# Patient Record
Sex: Female | Born: 1935 | Race: White | Hispanic: No | Smoking: Former smoker
Health system: Southern US, Community
[De-identification: ages and names within clinical notes are randomized; demographics above are authoritative.]

## PROBLEM LIST (undated history)

## (undated) DIAGNOSIS — E119 Type 2 diabetes mellitus without complications: Secondary | ICD-10-CM

## (undated) DIAGNOSIS — I2510 Atherosclerotic heart disease of native coronary artery without angina pectoris: Secondary | ICD-10-CM

## (undated) DIAGNOSIS — K8020 Calculus of gallbladder without cholecystitis without obstruction: Secondary | ICD-10-CM

## (undated) DIAGNOSIS — I639 Cerebral infarction, unspecified: Secondary | ICD-10-CM

## (undated) DIAGNOSIS — E785 Hyperlipidemia, unspecified: Secondary | ICD-10-CM

## (undated) DIAGNOSIS — I495 Sick sinus syndrome: Secondary | ICD-10-CM

## (undated) DIAGNOSIS — F0390 Unspecified dementia without behavioral disturbance: Secondary | ICD-10-CM

## (undated) DIAGNOSIS — I10 Essential (primary) hypertension: Secondary | ICD-10-CM

## (undated) DIAGNOSIS — F419 Anxiety disorder, unspecified: Secondary | ICD-10-CM

## (undated) HISTORY — DX: Essential (primary) hypertension: I10

## (undated) HISTORY — PX: TOTAL ABDOMINAL HYSTERECTOMY W/ BILATERAL SALPINGOOPHORECTOMY: SHX83

## (undated) HISTORY — DX: Sick sinus syndrome: I49.5

## (undated) HISTORY — DX: Anxiety disorder, unspecified: F41.9

## (undated) HISTORY — PX: ABDOMINAL SURGERY: SHX537

## (undated) HISTORY — PX: TONSILLECTOMY: SUR1361

## (undated) HISTORY — PX: APPENDECTOMY: SHX54

## (undated) HISTORY — DX: Hyperlipidemia, unspecified: E78.5

## (undated) HISTORY — DX: Calculus of gallbladder without cholecystitis without obstruction: K80.20

## (undated) HISTORY — PX: CORONARY ARTERY BYPASS GRAFT: SHX141

## (undated) HISTORY — DX: Type 2 diabetes mellitus without complications: E11.9

## (undated) HISTORY — PX: ESOPHAGOGASTRODUODENOSCOPY: SHX1529

## (undated) HISTORY — DX: Atherosclerotic heart disease of native coronary artery without angina pectoris: I25.10

## (undated) HISTORY — DX: Unspecified dementia, unspecified severity, without behavioral disturbance, psychotic disturbance, mood disturbance, and anxiety: F03.90

---

## 1997-05-27 ENCOUNTER — Emergency Department (HOSPITAL_COMMUNITY): Admission: EM | Admit: 1997-05-27 | Discharge: 1997-05-27 | Payer: Self-pay | Admitting: Emergency Medicine

## 1997-06-29 ENCOUNTER — Ambulatory Visit (HOSPITAL_COMMUNITY): Admission: RE | Admit: 1997-06-29 | Discharge: 1997-06-29 | Payer: Self-pay | Admitting: Family Medicine

## 1997-10-09 ENCOUNTER — Encounter: Admission: RE | Admit: 1997-10-09 | Discharge: 1998-01-07 | Payer: Self-pay | Admitting: Family Medicine

## 1998-01-02 ENCOUNTER — Encounter: Payer: Self-pay | Admitting: Family Medicine

## 1998-01-02 ENCOUNTER — Ambulatory Visit (HOSPITAL_COMMUNITY): Admission: RE | Admit: 1998-01-02 | Discharge: 1998-01-02 | Payer: Self-pay | Admitting: Family Medicine

## 1998-03-26 ENCOUNTER — Encounter: Admission: RE | Admit: 1998-03-26 | Discharge: 1998-06-24 | Payer: Self-pay | Admitting: Family Medicine

## 1998-03-27 ENCOUNTER — Inpatient Hospital Stay (HOSPITAL_COMMUNITY): Admission: EM | Admit: 1998-03-27 | Discharge: 1998-03-29 | Payer: Self-pay | Admitting: Internal Medicine

## 1998-03-28 ENCOUNTER — Encounter (HOSPITAL_BASED_OUTPATIENT_CLINIC_OR_DEPARTMENT_OTHER): Payer: Self-pay | Admitting: Internal Medicine

## 1998-05-06 ENCOUNTER — Emergency Department (HOSPITAL_COMMUNITY): Admission: EM | Admit: 1998-05-06 | Discharge: 1998-05-06 | Payer: Self-pay | Admitting: Emergency Medicine

## 1998-05-06 ENCOUNTER — Encounter: Payer: Self-pay | Admitting: Emergency Medicine

## 1998-12-27 ENCOUNTER — Encounter: Payer: Self-pay | Admitting: Family Medicine

## 1998-12-27 ENCOUNTER — Ambulatory Visit (HOSPITAL_COMMUNITY): Admission: RE | Admit: 1998-12-27 | Discharge: 1998-12-27 | Payer: Self-pay | Admitting: Family Medicine

## 1999-01-17 ENCOUNTER — Ambulatory Visit (HOSPITAL_COMMUNITY): Admission: RE | Admit: 1999-01-17 | Discharge: 1999-01-17 | Payer: Self-pay | Admitting: Family Medicine

## 1999-01-17 ENCOUNTER — Encounter: Payer: Self-pay | Admitting: Family Medicine

## 2001-08-28 ENCOUNTER — Emergency Department (HOSPITAL_COMMUNITY): Admission: EM | Admit: 2001-08-28 | Discharge: 2001-08-28 | Payer: Self-pay | Admitting: Emergency Medicine

## 2001-09-19 ENCOUNTER — Observation Stay (HOSPITAL_COMMUNITY): Admission: EM | Admit: 2001-09-19 | Discharge: 2001-09-20 | Payer: Self-pay | Admitting: Emergency Medicine

## 2001-09-20 ENCOUNTER — Encounter: Payer: Self-pay | Admitting: Internal Medicine

## 2001-11-18 ENCOUNTER — Ambulatory Visit (HOSPITAL_COMMUNITY): Admission: RE | Admit: 2001-11-18 | Discharge: 2001-11-18 | Payer: Self-pay | Admitting: Family Medicine

## 2001-11-18 ENCOUNTER — Encounter: Payer: Self-pay | Admitting: Family Medicine

## 2002-01-27 HISTORY — PX: PACEMAKER INSERTION: SHX728

## 2002-01-31 ENCOUNTER — Encounter: Payer: Self-pay | Admitting: Family Medicine

## 2002-01-31 ENCOUNTER — Ambulatory Visit (HOSPITAL_COMMUNITY): Admission: RE | Admit: 2002-01-31 | Discharge: 2002-01-31 | Payer: Self-pay | Admitting: Family Medicine

## 2002-03-23 ENCOUNTER — Inpatient Hospital Stay (HOSPITAL_COMMUNITY): Admission: EM | Admit: 2002-03-23 | Discharge: 2002-03-28 | Payer: Self-pay | Admitting: Emergency Medicine

## 2002-03-26 ENCOUNTER — Encounter: Payer: Self-pay | Admitting: *Deleted

## 2002-03-27 ENCOUNTER — Encounter: Payer: Self-pay | Admitting: *Deleted

## 2002-03-28 ENCOUNTER — Encounter (INDEPENDENT_AMBULATORY_CARE_PROVIDER_SITE_OTHER): Payer: Self-pay | Admitting: Cardiology

## 2002-05-24 ENCOUNTER — Ambulatory Visit (HOSPITAL_COMMUNITY): Admission: RE | Admit: 2002-05-24 | Discharge: 2002-05-24 | Payer: Self-pay | Admitting: Family Medicine

## 2002-05-24 ENCOUNTER — Encounter: Payer: Self-pay | Admitting: Family Medicine

## 2002-08-29 ENCOUNTER — Inpatient Hospital Stay (HOSPITAL_COMMUNITY): Admission: RE | Admit: 2002-08-29 | Discharge: 2002-09-01 | Payer: Self-pay | Admitting: Family Medicine

## 2002-08-31 ENCOUNTER — Encounter: Payer: Self-pay | Admitting: Family Medicine

## 2003-02-10 ENCOUNTER — Ambulatory Visit (HOSPITAL_COMMUNITY): Admission: RE | Admit: 2003-02-10 | Discharge: 2003-02-10 | Payer: Self-pay | Admitting: Family Medicine

## 2003-02-17 ENCOUNTER — Ambulatory Visit (HOSPITAL_COMMUNITY): Admission: RE | Admit: 2003-02-17 | Discharge: 2003-02-17 | Payer: Self-pay | Admitting: *Deleted

## 2003-03-24 ENCOUNTER — Emergency Department (HOSPITAL_COMMUNITY): Admission: EM | Admit: 2003-03-24 | Discharge: 2003-03-24 | Payer: Self-pay | Admitting: Emergency Medicine

## 2003-10-23 ENCOUNTER — Other Ambulatory Visit: Admission: RE | Admit: 2003-10-23 | Discharge: 2003-10-23 | Payer: Self-pay | Admitting: Family Medicine

## 2004-01-05 ENCOUNTER — Ambulatory Visit (HOSPITAL_COMMUNITY): Admission: RE | Admit: 2004-01-05 | Discharge: 2004-01-05 | Payer: Self-pay | Admitting: Gastroenterology

## 2004-04-17 ENCOUNTER — Ambulatory Visit (HOSPITAL_COMMUNITY): Admission: RE | Admit: 2004-04-17 | Discharge: 2004-04-17 | Payer: Self-pay | Admitting: Family Medicine

## 2004-06-14 ENCOUNTER — Ambulatory Visit (HOSPITAL_COMMUNITY): Admission: RE | Admit: 2004-06-14 | Discharge: 2004-06-14 | Payer: Self-pay | Admitting: Family Medicine

## 2004-06-28 ENCOUNTER — Encounter: Admission: RE | Admit: 2004-06-28 | Discharge: 2004-06-28 | Payer: Self-pay | Admitting: Family Medicine

## 2004-09-02 ENCOUNTER — Ambulatory Visit (HOSPITAL_COMMUNITY): Admission: RE | Admit: 2004-09-02 | Discharge: 2004-09-02 | Payer: Self-pay | Admitting: *Deleted

## 2005-07-06 ENCOUNTER — Emergency Department (HOSPITAL_COMMUNITY): Admission: EM | Admit: 2005-07-06 | Discharge: 2005-07-07 | Payer: Self-pay | Admitting: Emergency Medicine

## 2005-07-10 ENCOUNTER — Inpatient Hospital Stay (HOSPITAL_COMMUNITY): Admission: AD | Admit: 2005-07-10 | Discharge: 2005-07-23 | Payer: Self-pay | Admitting: *Deleted

## 2005-07-10 ENCOUNTER — Encounter (INDEPENDENT_AMBULATORY_CARE_PROVIDER_SITE_OTHER): Payer: Self-pay | Admitting: *Deleted

## 2005-09-11 ENCOUNTER — Encounter (HOSPITAL_COMMUNITY): Admission: RE | Admit: 2005-09-11 | Discharge: 2005-12-10 | Payer: Self-pay | Admitting: Cardiology

## 2006-07-13 ENCOUNTER — Ambulatory Visit: Payer: Self-pay | Admitting: Thoracic Surgery (Cardiothoracic Vascular Surgery)

## 2007-07-12 ENCOUNTER — Ambulatory Visit: Payer: Self-pay | Admitting: Thoracic Surgery (Cardiothoracic Vascular Surgery)

## 2007-07-12 ENCOUNTER — Encounter
Admission: RE | Admit: 2007-07-12 | Discharge: 2007-07-12 | Payer: Self-pay | Admitting: Thoracic Surgery (Cardiothoracic Vascular Surgery)

## 2007-10-18 ENCOUNTER — Encounter: Admission: RE | Admit: 2007-10-18 | Discharge: 2007-10-18 | Payer: Self-pay | Admitting: Family Medicine

## 2008-11-10 ENCOUNTER — Encounter: Admission: RE | Admit: 2008-11-10 | Discharge: 2008-11-10 | Payer: Self-pay | Admitting: Family Medicine

## 2008-11-20 ENCOUNTER — Encounter: Admission: RE | Admit: 2008-11-20 | Discharge: 2008-11-20 | Payer: Self-pay | Admitting: Family Medicine

## 2010-02-17 ENCOUNTER — Encounter: Payer: Self-pay | Admitting: Family Medicine

## 2010-04-08 ENCOUNTER — Ambulatory Visit
Admission: RE | Admit: 2010-04-08 | Discharge: 2010-04-08 | Disposition: A | Discharge status: Home / Self Care | Payer: Medicare Other | Source: Ambulatory Visit | Attending: Family Medicine | Admitting: Family Medicine

## 2010-04-08 ENCOUNTER — Other Ambulatory Visit: Payer: Self-pay | Admitting: Family Medicine

## 2010-04-08 DIAGNOSIS — G459 Transient cerebral ischemic attack, unspecified: Secondary | ICD-10-CM

## 2010-05-02 ENCOUNTER — Ambulatory Visit
Admission: RE | Admit: 2010-05-02 | Discharge: 2010-05-02 | Disposition: A | Discharge status: Home / Self Care | Payer: Medicare Other | Source: Ambulatory Visit | Attending: Family Medicine | Admitting: Family Medicine

## 2010-05-02 ENCOUNTER — Other Ambulatory Visit: Payer: Self-pay | Admitting: Family Medicine

## 2010-05-02 DIAGNOSIS — R0600 Dyspnea, unspecified: Secondary | ICD-10-CM

## 2010-05-09 ENCOUNTER — Other Ambulatory Visit: Payer: Self-pay | Admitting: Family Medicine

## 2010-05-09 DIAGNOSIS — Z1231 Encounter for screening mammogram for malignant neoplasm of breast: Secondary | ICD-10-CM

## 2010-05-29 ENCOUNTER — Ambulatory Visit
Admission: RE | Admit: 2010-05-29 | Discharge: 2010-05-29 | Disposition: A | Discharge status: Home / Self Care | Payer: Medicare Other | Source: Ambulatory Visit | Attending: Family Medicine | Admitting: Family Medicine

## 2010-05-29 DIAGNOSIS — Z1231 Encounter for screening mammogram for malignant neoplasm of breast: Secondary | ICD-10-CM

## 2010-06-11 NOTE — Assessment & Plan Note (Signed)
OFFICE VISIT   Valerie Carr, Valerie Carr  DOB:  1929-03-06                                        July 12, 2007  CHART #:  [id number]   HISTORY:  The patient  is a 77 year old female who returns to our office  exactly 2 years following her coronary artery bypass grafting x3 and  modified Cox-Maze procedure.  This was done July 11, 2005, for left main  disease and paroxysmal atrial fibrillation.  Currently, she reports that  she is overall doing well.  She describes some occasional shortness of  breath, which appears to be chronic in nature and unchanged recently.  She denies any palpitations.  She denies chest pain.  She occasionally  does get dizzy, but she attributes this primarily to being out in the  hot weather, which she tries to avoid at this time.   MEDICATIONS:  Remain stable.  She has not required Coumadin.  She has  had no additional antiarrhythmic medications required.   Chest x-ray was performed on today's date.  Lungs appear clear.  There  are no infiltrates.  No evidence of congestive failure.  No effusions.   PHYSICAL EXAMINATION:  Vital signs:  Blood pressure is 158/78, pulse 69  and regular, respirations 18 and unlabored, and oxygen  saturation is  98% on room air.  A rhythm strip was obtained on today's date as well.  She appears to be atrial pacing.  General appearance:  She is an elderly  female in no acute distress.  Pulmonary:  Clear breath sounds  throughout.  Cardiac:  Regular rate and rhythm.  Normal S1 and S2  without murmurs, gallops, or rubs.  Extremities:  No edema.  Incisions  are all well-healed.   ASSESSMENT:  The patient  continues to do well following her coronary  bypass and modified Cox-Maze procedure.  She has not had any further  difficulties with atrial fibrillation.  She is  scheduled to see her cardiologist in the next couple of months.  She was  unsure of the exact date.  From our viewpoint, we can discharge her at  this time for follow up to be on a p.r.n. basis.   Rowe Clack, P.A.-C.   Sherryll Burger  D:  07/12/2007  T:  07/13/2007  Job:  [id number]   cc:   Salvatore Decent. Cornelius Moras, M.D.  Francisca December, M.D.

## 2010-06-11 NOTE — Assessment & Plan Note (Signed)
OFFICE VISIT   CALIAH, KOPKE  DOB:  17-Aug-1929                                        July 13, 2006  CHART #:  [id number]   Mrs. Valerie Carr returned for a routine followup and rhythm check, now almost  exactly one year status post coronary artery bypass grafting x3 and a  Maze procedure.  She was last seen here in the office December 08, 2005.  Since then she had done well from a medical standpoint.  She continues  to have trouble with stress and a variety of issues at home, but her  medical issues have completely stabilized and she has done well for the  last several months.  She reports that previous difficulties with  shortness of breath completely resolved, and she is not getting around  without limitation.  She still has continued to lose some weight and she  is not sure if this is related to stress or poor appetite.  She denies  any chest pain or chest tightness, either with activity or at rest.  She  denies any palpitations or dizzy spells.  She has not had any documented  arrhythmias.  The remainder of her review of systems is unrevealing.  The remainder of her past medical history is unchanged.   CURRENT MEDICATIONS:  Aspirin, Benicar, Lasix, Zocor, Protonix,  Cymbalta, trazodone, and glyburide and metformin.   PHYSICAL EXAMINATION:  GENERAL:  Well-appearing, elderly female.  VITAL SIGNS:  Blood pressure 127/68, pulse 70 and regular.  Two-channel  rhythm strip demonstrates normal sinus rhythm.  CHEST:  Completely healed medium sternotomy scar.  Breath sounds are  clear and symmetric bilaterally.  No wheezes or rhonchi are  demonstrated.  CARDIOVASCULAR:  Regular rate and rhythm.  No murmurs, rubs or gallops  are noted.  ABDOMEN:  Soft, nontender.  EXTREMITIES:  Warm and well perfused.  There is no lower extremity  edema.   IMPRESSION:  Satisfactory progress and stable rhythm now one year status  post coronary artery bypass grafting x3 and Maze  procedure.   PLAN:  We will ask Mrs. Weisensel to return in one year for another routine  followup visit and rhythm check.   Salvatore Decent. Cornelius Moras, M.D.  Electronically Signed   CHO/MEDQ  D:  07/13/2006  T:  07/13/2006  Job:  [id number]   cc:   Francisca December, M.D.  Stacie Acres Cliffton Asters, M.D.

## 2010-06-14 NOTE — H&P (Signed)
NAME:  Valerie Carr, HEATHCOCK                        ACCOUNT NO.:  [id number]   MEDICAL RECORD NO.:  [id number]                   PATIENT TYPE:  INP   LOCATION:  1833                                 FACILITY:  [facility]   PHYSICIAN:  Meade Maw, M.D.                 DATE OF BIRTH:  11-18-1929   DATE OF ADMISSION:  03/23/2002  DATE OF DISCHARGE:                                HISTORY & PHYSICAL   ADMISSION DIAGNOSES:  1. Chest pain, rule out myocardial infarction.  2. Cardiac risk factors including age, diabetes mellitus type 2,     hypertension, hyperlipidemia, hormone replacement therapy, history of     tobacco use.   CHIEF COMPLAINT:  Chest pain with left arm discomfort.   HISTORY OF PRESENT ILLNESS:  The patient is a 77 year old widowed white  female with history of angina diagnosed in the late 1990s by Dr. Silverio Decamp.  She did not have a heart catheterization.  Apparently she had a stress test  which was negative for ischemia at that time.  Cardiac risk factors include  age, diabetes mellitus type 2, hypertension, hyperlipidemia, hormone  replacement therapy, history of tobacco use and relative sedentary  lifestyle.  About one month ago she noticed onset of substernal chest pain/squeezing  associated with nausea; onset even at rest.  It lasted about 30 minutes.  She felt sick sick like I would pass out.  No syncope.  Intermittent  dizziness.  Over the last couple of weeks she has had associated left arm  aching.  She saw Dr. Nicholos Johns on February 19; EKG hadn't caused any damage.  Referred her for a stress test with Dr. Mayford Knife.  Last night around 9 p.m. in bed, she developed left upper chest pain with  left arm discomfort.  After 30 minutes, the chest pain got better but the  left arm continued to hurt.  When she awoke this morning, left arm was still  aching, felt tight and hurting so badly.  She felt sick again.  Took a  sublingual nitroglycerin that resolved her chest pain but the  left arm was  still hurting her.  She called the doctor's office and was advised to come  to the emergency room for further evaluation.  Currently she is not  experiencing any chest pain and has mild left arm discomfort.   ALLERGIES:  No known drug allergies.   MEDICATIONS:  1. Adalat 300/12.5 mg.  2. Norvasc 5 mg.  3. Pravachol 80 mg.  4. Glucovance 1.25/250 mg one-half b.i.d.  5. Paxil 25 mg daily (CR).  6. Hormone patch-Vivelle 0.05 mg.  7. Trazodone 50 mg q.h.s.  8. Midrin p.r.n. headache, two at the onset, then one every hour until     relieved, maximum 5 in a 12-hour period.  9. Sublingual nitroglycerin on onset of pain.  10.      Aspirin - Has been forgetting to take this  lately.  11.      Aciphex.   PAST MEDICAL HISTORY:  1. Angina diagnosed by Dr. Patty Sermons years ago.  She had an adenosine     Cardiolite April 05, 1998 which was negative for ischemia.  No wall motion     abnormalities.  EF was 77%.  2. Hypertension.  3. Diabetes mellitus type 2.  4. Hyperlipidemia.  5. Depression.  6. History of cholelithiasis.   PAST SURGICAL HISTORY:  Tonsillectomy, hysterectomy, appendectomy, and left  cataract surgery.   FAMILY HISTORY:  Mother deceased at age 35.  Had diabetes.  Father deceased  at age 68, had angina and hardening of the arteries.  Four brothers and  three sisters.  All deceased except two.  Two of them had heart disease.   SOCIAL HISTORY:  The patient is widowed.  Lives alone.  Mother of three,  grandmother of five, great grandmother of four.  Quit smoking five years  ago.  Prior to that she had smoked a pack a day for at least 45 years.  No  alcohol.  Two cups of coffee a day.  No regular exercise currently.   REVIEW OF SYSTEMS:  No cold symptoms.  No GI complaints or GU complaints.  She has had increased episodes of migraine headaches lately, three this  week.  Seems to be more forgetful lately.  In fact, a couple of days ago was  driving home and forgot  where she was going.  She had to pull over and then  after her memory came back in about five minutes, she was able to finish  driving home.   PHYSICAL EXAMINATION:  VITAL SIGNS:  Temperature is 97.8, blood pressure  142/78, pulse 65, respirations 18, SAO2 96% on room air.  GENERAL:  The patient is oriented x 3, in no acute distress, lying on a  stretcher.  HEENT:  Normocephalic, atraumatic.  Wears glasses. Partial deafness.  NECK:  Supple without bruits or masses.  LUNGS:  Clear to auscultation with occasional fibrotic breath sounds at the  very bases.  HEART:  Regular rate and rhythm without murmurs, gallops or rubs.  ABDOMEN:  Soft, nontender, with positive bowel sounds.  EXTREMITIES:  2+ femoral pulses bilaterally.  No bruits.  2+ equal  bilaterally without edema.  NEUROLOGIC:  Nonfocal.  Mentation intact.   LABORATORY DATA:  EKG shows sinus bradycardia with heart rate of 59,  nonspecific ST-T changes in 3 and aVF.  Chest x-ray is pending.  Sodium 138, potassium 3.9, BUN 15, creatinine 0.8, glucose 115.  Hemoglobin  14.8, WBC 4.5, platelets 189.  LFTs within normal limits.  CK 64.   IMPRESSION:  1. Chest pain worrisome for angina; rule out myocardial infarction.  2. Hypertension.  3. Hyperlipidemia.  4. Diabetes mellitus type 2.  5. History of tobacco use.  6. Sedentary lifestyle.  7. Hormone replacement therapy.    PLAN:  Admit.  IV heparin, IV nitroglycerin. Check cardiac enzymes.  Call  Dr. Yevonne Pax office for records.  Will call the patient's pharmacy for an  accurate medication list.  I have made Dr. Fraser Din aware of this patient.  She will be by to evaluate the patient later.  Will go ahead and schedule  her for a Cardiolite stress test tomorrow.  She may require cardiac  catheterization if stress test is positive or if enzymes are positive.       Georgiann Cocker Jernejcic, P.A.  Meade Maw, M.D.    TCJ/MEDQ  D:  03/23/2002  T:  03/23/2002   Job:  [id number]   cc:   Meade Maw, M.D.  [address]  [place]  [place] [address]  Fax: [phone]   Elana Alm. Nicholos Johns, M.D.  [address]  [place]  [place] [address]  Fax: [phone]

## 2010-06-14 NOTE — H&P (Signed)
NAME:  Valerie Carr, Valerie Carr                        ACCOUNT NO.:  [id number]   MEDICAL RECORD NO.:  [id number]                   PATIENT TYPE:  INP   LOCATION:  5151                                 FACILITY:  [facility]   PHYSICIAN:  Alanson Aly. Lenise Arena, M.D.             DATE OF BIRTH:  05/31/1929   DATE OF ADMISSION:  09/18/2001  DATE OF DISCHARGE:  09/20/2001                                HISTORY & PHYSICAL   ADMISSION DIAGNOSES:  1. Vertigo and nausea.  2. Hypertension.  3. Type 2 diabetes.  4. Hyperlipidemia.   CHIEF COMPLAINT:  Vertigo and vomiting.   HISTORY OF PRESENT ILLNESS:  The patient is a 77 year old Caucasian female  with a history of hypertension and diabetes who awoke this evening at 2100  hours with the sudden onset of severe vertigo and vomiting. She was unable  to ambulate so she called EMS.  She was found to be very hypertensive upon  arrival to the emergency department with a diastolic blood pressure up to  140.  No intervention was performed as her blood pressure returned to  acceptable range.  She became essentially asymptomatic except for a fuzzy  sensation in her head.  At no time did she develop focal neurologic  symptoms, chest pain, or shortness of breath.  I was asked to admit her for  observation overnight.   PAST MEDICAL HISTORY:  1. Hypertension.  2. Type 2 diabetes.  3. Depression.  4. Hyperlipidemia.  5. History of migraine headaches.  6. Allergic rhinitis.  7. Status post tonsillectomy.  8. Status post hysterectomy.  9. Status post appendectomy.   MEDICATIONS:  1. Avalide unknown dose.  2. Claritin.  3. Trazodone 100 mg one half tablet q.h.s.  4. Glucovance unknown dose.  5. Pravachol unknown dose.  6. Lexapro 10 mg daily.   ALLERGIES:  No known drug allergies.   SOCIAL HISTORY:  No tobacco, alcohol or illicit drug use.  Retired.   REVIEW OF SYSTEMS:  Other than her initial vertigo and vomiting, she is  asymptomatic except for a very  dull discomfort in her head and some  generalized weakness.  Complete review of systems otherwise negative.   PHYSICAL EXAMINATION:  GENERAL APPEARANCE:  Well-appearing, in no distress.  VITAL SIGNS:  HEENT:  Tympanic membranes clear.  Eyes without erythema or discharge.  Oropharynx clear.  EOMI, PERRL.  NECK:  Supple without lymphadenopathy or tenderness.  CHEST:  Clear to auscultation bilaterally.  CARDIOVASCULAR:  Regular without murmur.  ABDOMEN:  Soft and nontender, nondistended, no masses, moderately obese.  EXTREMITIES:  No edema.  NEUROLOGIC:  Cranial nerves II-XII intact.  No weakness including no facial  weakness.   LABORATORY DATA:  White blood cell count 5.9, hemoglobin 14.3, platelets  225.  Sodium 141, potassium 3.6, chloride 106, bicarb 29, BUN 18, creatinine  0.8, glucose 161.  Amylase 66, lipase 32.  CK 63, MB fraction 1.5, troponin  I 0.03.  The remainder of the CMET within normal limits.  PT and PTT normal.   ECG shows normal sinus rhythm without ectopy or ischemic changes.   CT scan of the brain is normal.  Chest x-ray is normal.   ASSESSMENT:  The patient is a 77 year old female with metabolic syndrome who  felt the sudden onset of vertigo and vomiting this evening. Those symptoms  are resolved.  However, she did have transient extreme blood pressure  elevation.  Her work-up so far is negative.  Her transient hypertension was  likely secondary to her symptoms.   PLAN:  She is being observed overnight.  I do not feel that intensive  monitoring is necessary.  She understands that she should let the nursing  staff know if she develops headache, vertigo, nausea, chest pain, shortness  of breath, or any other unusual symptoms.  Unfortunately, does not know the  dosage of many of her medications so that will need to be determined in the  morning.  Hopefully, her evening will be uneventful and she can be  discharged home.                                                 Alanson Aly. Lenise Arena, M.D.    SCM/MEDQ  D:  09/19/2001  T:  09/21/2001  Job:  [id number]   cc:   Molly Maduro A. Nicholos Johns, M.D.

## 2010-06-14 NOTE — Discharge Summary (Signed)
Valerie Carr, Valerie Carr              ACCOUNT NO.:  [id number]   MEDICAL RECORD NO.:  [id number]          PATIENT TYPE:  INP   LOCATION:  2039                         FACILITY:  [facility]   PHYSICIAN:  Salvatore Decent. Cornelius Moras, M.D. DATE OF BIRTH:  [birth year]   DATE OF ADMISSION:  07/10/2005  DATE OF DISCHARGE:                                 DISCHARGE SUMMARY   ADMISSION DIAGNOSIS:  Chest pain.   PAST MEDICAL HISTORY AND DISCHARGE DIAGNOSES:  1.  Type 2 diabetes mellitus.  2.  Hypertension.  3.  Depression.  4.  Appendectomy.  5.  Tonsillectomy.  6.  Hysterectomy.  7.  Incision and drainage of perineal abscess.  8.  Placement of transvenous pacemaker.  9.  Left  main coronary artery disease with unstable angina status post      coronary artery bypass grafting x3.  10. Paroxysmal atrial fibrillation status post modified Cox maze procedure.  11. Postoperative acute blood loss anemia.   ALLERGIES:  No known drug allergies; however, the patient complains of a  sensitivity to CODEINE with GI intolerance.   BRIEF HISTORY:  The patient is a 77 year old Caucasian female with known  history of coronary artery disease who was last catheterized in 2004 at  which time she was noted to have mild to moderate disease and treated  medically.  Also at that time, she had a transvenous dual-chamber pacemaker  placed for bradyarrhythmias.  Over the three weeks prior to admission, the  patient complained of accelerating symptoms and exertional shortness of  breath, chest tightness, and decreased exercise tolerance.  She was  evaluated by Dr. Fraser Din in her office and scheduled for an elective cardiac  catheterization on July 10, 2005.   HOSPITAL COURSE:  The patient was admitted on July 10, 2005, and taken for  elective cardiac catheterization by Dr. Fraser Din.  This demonstrated left  main coronary artery disease in the setting of unstable angina.  Left  ventricular systolic function was well preserved.   There was no evidence of  valvular heart disease.  Secondary to these findings, Dr. Donata Clay, of the  CVTS service, was consulted regarding surgical revascularization.  After his  evaluation of the patient, it was his opinion that the patient should  proceed with coronary artery bypass grafting by Dr. Cornelius Moras the following  morning.   The patient was taken to the OR on July 11, 2005, for coronary artery bypass  grafting x3 as well as modified Cox maze procedure.  The left internal  mammary artery was grafted to the distal LAD.  Saphenous vein was grafted to  the circumflex marginal, and saphenous vein was grafted to the distal right  coronary artery.  Endoscopic vessel harvesting was performed on the right  lower extremity.  The patient tolerated the procedure well and was  hemodynamically stable immediately postoperatively.  The patient was  transferred from the OR to the unit in stable condition.  The patient was  extubated without complication and woke up from anesthesia neurologically  intact.   The patient's postoperative course was somewhat prolonged in the SICU  secondary to  increased oxygen requirement as well as prolonged need of  pleural chest tubes.  She was initially somewhat slow to ambulate as well.  On postoperative day #1, her only complaint was a soreness.  She was  maintaining normal sinus rhythm.  She was afebrile with stable vital signs.  Her mediastinal tubes were discontinued in a routine manner; however, her  pleural tubes revealed increased output; therefore, they were left in place.  These were kept in place secondary to increased output that remained serous  and thin until postoperative day #6 when they were both discontinued in a  routine manner, and she tolerated this well.   The patient has been volume overloaded postoperatively and has been diuresed  accordingly.  Her hypoxemia was secondary to her volume overload as well as  atelectasis.  She was encouraged  to maintain an aggressive pulmonary toilet  and again was diuresed accordingly.  Her oxygen saturations have  subsequently decreased, and she is currently on room air on postoperative  day #10.   While in SICU, she was also noted to be hypertensive and was maintained on  dopamine for several days.  This was successfully weaned, and her blood  pressure has been relatively well maintained since that time.  The patient  began cardiac rehab on postoperative day #2 and has continued to increase  her tolerance to a satisfactory level at this time. The patient was started  on Coumadin on postoperative day #2 secondary to her history of paroxysmal  atrial fibrillation.  This has been titrated back to her home dosing at this  time.   On postoperative day #4, the patient was noted to go into atrial  fibrillation and was started on IV amiodarone.  She subsequently converted  to normal sinus rhythm and has remained in normal sinus rhythm since that  time.  Amiodarone was switched to p.o., and she has been continued on  Coumadin.  Also, on postoperative day #4, she was started on a short course  of steroids in an attempt to decrease the pleural chest tube output.  As  previously stated, this was successful, and chest tubes were discontinued on  postoperative day #6.   Diabetes mellitus has been maintained throughout the postoperative course  with Lantus and sliding scale insulin.  She has currently been restarted on  her home p.o. medications, and her blood sugars have been well maintained.  Lantus insulin has been discontinued.  The remainder of the patient's  postoperative course has been dedicated to physical rehabilitation as well  as monitoring diabetes mellitus and continuing anticoagulation.   On postoperative day #10, the patient states that she is not feeling well,  but there are no specific complaints.  She is afebrile with stable vital signs and maintaining normal sinus rhythm.  On  physical exam, cardiac is  regular rate and rhythm, paced.  Respirations are clear to auscultation  bilaterally.  The abdomen is benign.  There is no edema present in bilateral  extremities, and incisions are clean, dry, and intact.  The patient is noted  to have a mild renal insufficiency with increased creatinine of 1.6.  Metformin and Benicar are currently being held secondary to this.  This will  continue to be monitored until her discharge.   Postoperative anemia has stabilized and is improving.  She is still volume  overloaded; however, this is improving with continued diuresis as well.  She  is in stable condition at this time, and providing she continues to  progress  in the current manner, she will be ready for discharge in the next several  days to a skilled nursing facility for continued care.   LABORATORY DATA:  CBC and BMET on July 21, 2005, show white count 1.7,  hemoglobin 11.4, hematocrit 33.5, platelets 479.  Sodium 132, potassium 5.1,  BUN 23, creatinine 1.6, glucose 165.  INR on July 21, 2005, is 1.4.   CONDITION ON DISCHARGE:  Stable.   INSTRUCTIONS:   #1 MEDICATIONS:  1.  Toprol XL 25 mg p.o. daily.  2.  Protonix 40 mg daily.  3.  Cymbalta 60 mg daily.  4.  Trazodone 100 mg p.o. nightly.  5.  Coumadin 5 mg daily.  6.  Spironolactone 25 mg daily.  7.  Multivitamins daily.  8.  Amiodarone 200 mg p.o. b.i.d.  9.  Aspirin 81 mg daily.  10. Benicar 20 mg daily.  11. Zocor 20 mg daily.  12. Lasix 40 mg daily.  13. K-Dur 20 mEq daily.  14. Glyburide 1.25 mg b.i.d.  15. Metformin 250 mg b.i.d.  16. Tylenol 650 mg p.o. q. 6 h p.r.n. pain or temperature of 101 or greater.  17. Ultram 50 mg 1 to 2 q. 4-6 h p.r.n. pain.  18. Ambien 5 mg p.o. nightly p.r.n.   ACTIVITY:  No driving and no lifting more than 10 pounds, and the patient  should continue daily breathing and walking exercises.   DIET:  Low-fat, low-salt, carbohydrate-modified, medium-calorie.   WOUND  CARE:  The patient may shower daily and clean the incisions with soap  and water.  If any  problems arise, the patient should contact the CVTS  office at [phone].   FOLLOWUP APPOINTMENTS:  1.  With Dr. Fraser Din  The patient will be instructed to contact their office      for an appointment 2 weeks after discharge.  PA and lateral chest x-ray      taken at time of that appointment.  2.  With Dr. Cornelius Moras 3 weeks after discharge.  The patient will be contacted      with the date and time of that appointment.      Pecola Leisure, PA      Salvatore Decent. Cornelius Moras, M.D.  Electronically Signed    AY/MEDQ  D:  07/21/2005  T:  07/21/2005  Job:  [id number]   cc:   Meade Maw, M.D.  Fax: [phone]

## 2010-06-14 NOTE — Discharge Summary (Signed)
NAME:  AJNA, MOORS                        ACCOUNT NO.:  [id number]   MEDICAL RECORD NO.:  [id number]                   PATIENT TYPE:  INP   LOCATION:  4703                                 FACILITY:  [facility]   PHYSICIAN:  Meade Maw, M.D.                 DATE OF BIRTH:  Jan 09, 1930   DATE OF ADMISSION:  DATE OF DISCHARGE:  03/28/2002                                 DISCHARGE SUMMARY   PROCEDURES:  1. Insertion of a Guidant dual chamber transvenous pacemaker by Dr. Corliss Marcus on March 25, 2002.  2. Adenosine Cardiolite negative for ischemia and scar on March 27, 2002.  3. On March 28, 2002, 2D echocardiogram, ejection fraction 55% to 65% , mild     left ventricular hypertrophy, findings consistent with abnormal left     ventricular relaxation.  4. On March 24, 2002, cardiac catheterization by Dr. Hillary Bow     revealing 30% to 40% diffuse left main, 50% proximal circumflex, 30% to     40% proximal RCA.   DISCHARGE DIAGNOSES:  1. Near syncope secondary to prolonged sinus arrest; symptomatic, near loss     of consciousness.  2. On March 25, 2002, permanent transvenous pacemaker implantation by Dr.     Corliss Marcus, [employer].  3. Episode nonsustained atrial fibrillation.  4. New systemic anticoagulation secondary to episode of atrial fibrillation.  5. Chest pain.     A. Ruled out myocardial infarction by negative serial cardiac enzymes.     B. On March 24, 2002, cardiac catheterization revealing borderline        left  main, borderline circumflex stenosis.  Followup adenosine        Cardiolite was negative for ischemia and scar. A 2D echocardiogram on        the day of discharge revealed good ejection fraction 55% to 65%. Study        was inadequate to evaluate for regional wall motion abnormalities.  6. Short-term memory loss. Outpatient evaluation scheduled for Dr. Orlin Hilding     April 07, 2002.   DISPOSITION:  The patient is discharged to home in  stable condition.   DISCHARGE MEDICATIONS:  1. Enteric coated aspirin 81 mg p.o. every day  2. Avalide 300/12.5 p.o. every day  3. Norvasc 5 mg p.o. every day.  4. Pravachol 80 mg p.o. every day.  5. Glucovance 1.25/250 1/2 p.o. b.i.d.  6. Paxil CR 25 mg p.o. every day.  7. Trazodone 50 mg p.o. q.h.s.  8. Midrin p.r.n. headaches.  9. Coumadin 5 mg p.o. q.h.s., new.  10.      Darvocet N-100 1 to  2 tablets q.4-6h. p.r.n. pain, new.   DISCHARGE INSTRUCTIONS:  No heavy lifting, pushing or pulling with left arm.  Restrict driving for at least 1 week. Do not let seat belt ride over  incision until healed. Diet low fat, low  cholesterol, no added salt,  diabetic diet. Wound care, may shower, keep Steri-Strips in place, pat dry.   FOLLOW UP:  1. Coumadin clinic Thursday, March 31, 2002, at 11:30 a.m.  2. The patient is to have her wound checked with Dr. Corliss Marcus, Tuesday,     April 05, 2002, at 10:15 a.m.  3. [facility], Dr. Orlin Hilding, April 07, 2002, at 9:30 a.m.  The     patient is to come to register at 9 o'clock a.m.  4. Dr. Hillary Bow, Tuesday April 19, 2002, at 12:45 p.m.   HISTORY OF PRESENT ILLNESS:  The patient is a very pleasant 77 year old  female with a history of hypertension, type 2 diabetes mellitus and  dyslipidemia. She had had a prior Cardiolite on April 05, 2002, which was  negative for ischemia with regional wall motion abnormalities. Her ejection  fraction was 77% at that time.   The patient was admitted with a 1 month history of substernal chest pain and  squeezing associated with nausea, not particularly associated with exertion.  She had symptoms of presyncope with this and intermittent dizziness. For the  last few weeks prior to admission she had associated left arm achiness. She  had been seen by Dr. Nicholos Johns on March 17, 2002. Her EKG looked  unremarkable. She had been referred for a followup stress test with Dr.  Mayford Knife.   On the evening   prior to  admission she developed left upper chest pain with  left arm discomfort. The chest pain improved but the left arm continued to  hurt. She awoke the morning of admission with her arm still aching and her  chest still tight. She took sublingual nitrate which relieved her chest pain  but she had residual arm discomfort. She presented to the emergency room and  was pain free at the time of evaluation by Dr. Fraser Din.   Her admission EKG was nonspecific. She was started on IV nitrates, heparin,  with continuation of aspirin. She did rule out for myocardial infarction by  negative serial cardiac enzymes.   HOSPITAL COURSE:  On the first hospital day the patient developed new onset  atrial fibrillation with initial controlled ventricular response, then  subsequent episodic pauses as long as 5 seconds and 12 seconds. Heart rate  subsequently 60s. She had no loss of consciousness. She did have nausea with  a small amount of bilious emesis. She denied chest pain and short of breath,  although she felt anxious.   Initial SBP in the 80s improved after IV fluid bolus to systolic of 116.  Heart rate improved to 60s and 80s. Transthoracic pacemaker tags were  applied and she was transferred to the ICU.   An ISTAT 8 revealed electrolytes within good range. She spontaneously  converted to normal sinus rhythm, heart rate 59, shortly thereafter. Her TSH  was checked and was OK at 2.079.   That afternoon the patient underwent a cardiac catheterization revealing 30%  to 40% diffuse left main which was smaller in caliber than LAD/CFX.  The LAD  was tortuous with a small diagonal 1 and diagonal 2 and moderate sized  diagonal 3. The LAD was 40% to 50% stenosed mid. The circumflex had a 50%  proximal stenosis. She had a small OM1 and large OM2. The RCA had 30% to 40%  proximal stenosis. She had a normal PDA off the RCA.   On March 25, 2002, the patient underwent implantation of a  permanent transvenous pacemaker  indices as follows:  Pacemaker generator was an  Rosanna Randy DR, model [phone]. Guidant manufacturer [id number]; serial [id number].  The atrial lead was a Guidant model #4469, 45 cm; serial [id number]. The  ventricular lead was a Guidant model #4458, 52 cm; serial [id number].   The patient tolerated the pacemaker insertion procedure well. Follow up  chest x-ray revealed leads grossly well positioned without pneumothorax.  Lungs appeared clear with normal vascularity.   Followup adenosine Cardiolite was negative for any evidence of ischemia or  infarct. A 2D echocardiogram revealed an ejection fraction of 55% to 65%. No  significant valvular abnormalities. There was some evidence for diastolic  dysfunction. Mild left ventricular hypertrophy.   On March 28, 2002, the patient was  doing well. She had mild discomfort at  the pacer wound site but the wound was well approximated without excessive  bruising or any drainage. She was discharged to home in stable condition.  Please refer to instructions as above.   LABORATORY DATA:  Admission WBC 4.5, hemoglobin 14.8, hematocrit 41,  platelets 189, differential within normal range. Admission coags within  normal range. Admission sodium 138, potassium 3.9, chloride 105, BUN 15,  creatinine 0.8, glucose 115. LFTs all within normal range. Serial cardiac  enzymes were negative x3 sets. Cholesterol 172, triglycerides 213, HDL 43,  LDL 86. TSH within normal range at 2.079.   An EKG revealed sinus bradycardia with heart rate 59, nonspecific STT  changes in 3 and AVF. Admission chest x-ray was negative for cardiopulmonary  process, left lower lobe scarring.  A chest x-ray March 26, 2002, post  pacemaker was negative for pneumothorax.   PAST MEDICAL HISTORY:  1. Hypertension.  2. Type 2 diabetes mellitus.  3. History of tobacco abuse.  4. Depression.  5. Cholelithiasis.   PAST SURGICAL HISTORY:  1.  Tonsillectomy.  2. Hysterectomy.  3. Appendectomy.  4. Left cataract surgery.   Total time preparing this discharge greater than 40 minutes including  dictating the discharge summary, filling out and reviewing discharge  instructions with the patient as well as setting followup office visits.     Salomon Fick, N.P.                       Meade Maw, M.D.    MES/MEDQ  D:  03/28/2002  T:  03/28/2002  Job:  [id number]   cc:   Molly Maduro A. Nicholos Johns, M.D.  [address]  [place]  [place] [address]  Fax: [phone]

## 2010-06-14 NOTE — Op Note (Signed)
[name], [name]              ACCOUNT NO.:  [id number]   MEDICAL RECORD NO.:  [id number]          PATIENT TYPE:  INP   LOCATION:  2312                         FACILITY:  [facility]   PHYSICIAN:  Guadalupe Maple, M.D.  DATE OF BIRTH:  06-25-1929   DATE OF PROCEDURE:  DATE OF DISCHARGE:                                 OPERATIVE REPORT   PROCEDURE:  Intraoperative transesophageal echocardiography.   Valerie Carr is a 77 year old white female with a history of unstable  angina who is found to have severe stenosis of the left main coronary artery  and is scheduled to undergo coronary artery bypass grafting by Dr. Tressie Stalker.  Intraoperative transesophageal echocardiography was requested to  evaluate the left ventricular function, assess for any valvular pathology  and serve as a monitor for intraoperative volume status.  The patient was  brought to the operating room at [facility], and general anesthesia  was induced without difficulty.  The trachea was intubated without  difficulty.  The transesophageal echocardiographic probe was inserted in the  esophagus without difficulty.   IMPRESSION:   PRE-BYPASS FINDINGS:  1.  Mitral valve:  The mitral valve leaflets were thin and pliable.  Coapted      well.  There was trace mitral insufficiency and no evidence of prolapse      or fluttering.  2.  Aortic valve:  The aortic valve was trileaflet.  Opened normally.  There      was mild thickening of the leaflets but no significant calcification      noted.  There was no aortic insufficiency.  3.  Left ventricle:  The left ventricular function was vigorous, and all      segments interrogated.  The ejection fraction estimated at 65-70%.      There was mild-to-moderate left ventricular hypertrophy.  Left      ventricular wall thickness measured approximately 1.25 cm at end      diastole and mid papillary level of the lateral wall.  4.  Right ventricle:  The right ventricle was  moderately dilated.  There was      good contractility of the right ventricular free wall.  The permanent      transvenous pacing lead was visible in the right ventricular cavity.  5.  Tricuspid valve:  The tricuspid valve leaflets were thickened.  There      was evidence of a pacing wire through the valve, and there was 1+      tricuspid insufficiency.  6.  Interatrial septum: The interatrial septum was intact without evidence      of patent foramen ovale or atrial septal defect.  7.  Left atrium:  The left atrial cavity appeared enlarged.  There was no      thrombus noted in the left atrium or left atrial appendage.   POST-BYPASS FINDINGS:  1.  Mitral valve:  The mitral valve is unchanged from the pre-bypass study.      There was good coaptation of the leaflets with no prolapse or fluttering      and trace mitral insufficiency.  2.  Aortic valve:  The aortic valve opened normally, and there was no aortic      insufficiency.  3.  Left ventricle:  There was good contractility of the left ventricular      cavity.  Ejection fraction again measured 65-70%.           ______________________________  Guadalupe Maple, M.D.     DCJ/MEDQ  D:  07/11/2005  T:  07/11/2005  Job:  [id number]

## 2010-06-14 NOTE — Op Note (Signed)
Valerie Carr, Valerie Carr              ACCOUNT NO.:  [id number]   MEDICAL RECORD NO.:  [id number]          PATIENT TYPE:  INP   LOCATION:  2312                         FACILITY:  [facility]   PHYSICIAN:  Salvatore Decent. Cornelius Moras, M.D. DATE OF BIRTH:  06-Nov-1929   DATE OF PROCEDURE:  07/11/2005  DATE OF DISCHARGE:                                 OPERATIVE REPORT   PREOPERATIVE DIAGNOSIS:  Left main disease, paroxysmal atrial fibrillation   POSTOPERATIVE DIAGNOSES:  Left main disease, paroxysmal atrial fibrillation   PROCEDURE:  Median sternotomy for coronary artery bypass grafting x3 (left  internal mammary artery to distal left anterior descending coronary artery,  saphenous vein graft to circumflex marginal branch, saphenous vein graft to  distal right coronary artery, endoscopic saphenous vein harvest from right  thigh) and modified Cox-Maze procedure.   SURGEON:  Salvatore Decent. Cornelius Moras, M.D.   ASSISTANT:  Rowe Clack, P.A.-C.   ANESTHESIA:  General.   BRIEF CLINICAL NOTE:  The patient is a 77 year old female from [place]  who is followed by Dr. Laurann Montana and Dr. Meade Maw with a history of  hypertension, type 2 diabetes mellitus, and recurrent paroxysmal atrial  fibrillation. The patient now presents with a 1 to 2 month history of  worsening symptoms of chest pain and shortness of breath.  She underwent  cardiac catheterization which demonstrated eccentric 80% stenosis of the  left main coronary artery with normal left ventricular function.  There was  a 50% ostial stenosis of the right coronary artery.  Left ventricular  function is normal.   OPERATIVE CONSENT:  The patient and her son and daughter have both been  counseled at length regarding the indications and potential benefits of  coronary artery bypass grafting.  Alternative treatment strategies have been  discussed.  The relative risks and benefits of concomitant Maze procedure  for treatment of atrial fibrillation have also  been reviewed.  They  understand and accept all associated risks of surgery including but not  limited to risk of death, stroke, myocardial infarction, congestive heart  failure, respiratory failure, pneumonia, bleeding requiring blood  transfusion, arrhythmia, infection, and recurrent coronary artery disease.  All their questions have been addressed.   OPERATIVE NOTE IN DETAIL:  The patient was brought to the operating room on  the above mentioned date and central monitoring was established by the  anesthesia service under the care and direction of Dr. Kipp Brood.  Specifically, a Swan-Ganz catheter is placed through the right internal  jugular approach.  A radial arterial line is placed.  Intravenous  antibiotics were administered.  Following induction with general  endotracheal anesthesia, a Foley catheter is placed.  The patient's chest,  abdomen, both groins, and both lower extremities are prepared and draped in  a sterile manner.   A median sternotomy incision is performed and the left internal mammary  artery is dissected from the chest wall and prepared for bypass grafting.  The left internal mammary artery is a good quality conduit.  Simultaneously,  saphenous vein was obtained from the patient's right thigh using endoscopic  vein harvest technique through  a small incision made just below the right  knee.  The saphenous vein is a good quality conduit.  After the saphenous  vein is removed, the small surgical incisions in the right lower extremity  are closed in multiple layers with running absorbable suture.   The patient is heparinized systemically.  The pericardium is opened.  The  ascending aorta is mildly dilated but otherwise normal in appearance.  The  ascending aorta is cannulated for cardiopulmonary bypass.  The venous  cannula is placed in the superior vena cava.  A second venous cannula is  placed low in the right atrium with the tip extending down the inferior  vena  cava.  A retrograde cardioplegic catheter was placed through the right  atrium into the coronary sinus.  Cardiopulmonary bypass is begun.  Vessel  loops were placed around the superior vena cava and inferior vena cava.  Distal sites were selected for coronary bypass grafting.  A temperature  probe is placed in the left ventricular septum.  A cardioplegic catheter was  placed in the ascending aorta.   The patient is cooled to 32 degrees systemic temperature.  The aortic  crossclamp was applied and cold blood cardioplegia is administered in an  antegrade fashion through the aortic root.  Iced saline slush was applied  for topical hypothermia.  The initial cardioplegic arrest and myocardial  cooling are felt to be excellent.  Supplemental cardioplegia is administered  retrograde through the coronary sinus catheter.  Repeat doses of  cardioplegia are administered intermittently throughout the crossclamp  portion of the operation through the aortic root, down subsequently placed  vein grafts, and retrograde through the coronary sinus catheter to maintain  left ventricular septal temperature below 15 degrees centigrade.   The heart was gently retracted towards the surgeon's side to expose the left  sided pulmonary veins.  The left sided pulmonary veins were encircled.  The  Medtronic Cardioblate radiofrequency irrigated bipolar ablation device was  utilized to create an elliptical ablation lesion around the base of the left  sided pulmonary veins.  After this lesion is completed, another elliptical  lesion is created across the base of the left atrial appendage.  The  unipolar irrigated radiofrequency ablation pen is now utilized to create a  linear lesion joining the ellipse surrounding the left sided pulmonary veins  with the lesion surrounding the base of the left atrial appendage.  The following distal coronary anastomoses were performed:  1.  The circumflex marginal branch is  grafted with a saphenous vein graft in      an end-to-side fashion.  This vessel measures 1.8 mm in diameter and is      good quality at the site of distal bypass.  2.  The distal right coronary artery is grafted with a saphenous vein graft      in an end-to-side fashion.  This vessel measures 2 mm in diameter and is      good quality at the site of distal bypass.  3.  The distal left anterior descending coronary artery is grafted with the      left internal mammary artery in an end-to-side fashion.  This vessel      measures 1.5 mm in diameter.  There is posterior plaque in this vessel      but the vessel is, otherwise, a good quality target.   A left atriotomy incision was performed posteriorly through the intra-atrial  groove.  A self-retaining retractor is utilized to expose the  floor of the  left atrium.  The remainder of the left sided lesion set of the Cox Maze  procedure is now completed.  The Medtronic Cardioblate irrigated  radiofrequency bipolar ablation device was utilized to create an elliptical  lesion around the base of the right sided pulmonary veins.  The atriotomy  incision serves as the anterior half of this ellipse and the posterior half  is completed with one limb of the device along the endocardial surface of  the left atrium and the other along the epicardial surface posteriorly.  A  linear lesion is now created across the dome of the left atrium joining the  superior apex at the atriotomy incision with the superior apex of the  elliptical lesion surrounding the left sided pulmonary veins.  A similar  parallel ablation lesion is created from the inferior apex of the atriotomy  incision across the back wall of the left atrium to reach the inferior apex  of the ellipse surrounding the left sided pulmonary veins.  Another lesion  is then created from the inferior apex of the atriotomy incision extended  towards mitral valve annulus as far as it will reach.  This lesion  is  completed all the way to the mitral valve annulus using the unipolar  irrigated radiofrequency ablation pen along the endocardial surface.  The  left atrial appendage was oversewn from within the left atrium using a two  layer closure of running 3-0 Prolene suture.   An oblique right atriotomy incision is performed beginning at the acute  margin of the heart and extending in a cephalad and posterior direction  towards the right inferior pulmonary vein.  The floor of the right atrium is  now exposed.  Care was taken to avoid dislodging or damaging the patient's  pre-existing pacemaker leads.  A linear lesion is created from the posterior  apex of the right atriotomy incision across the back wall of the left atrium  in the intra-atrial septum to reach the floor of the fossa ovalis with a  bipolar ablation device.  This is completed across the inferior rim of the coronary sinus and across the isthmus of the heart to reach the inferior  border of the tricuspid annulus creating a large broad lesion across the  isthmus to complete a mini-Maze lesion set.  The remainder of the right  sided lesion set was not performed due to concerns regarding the possibility  of dislodging the dismantling the pre-existing atrial pacemaker leads and as  such, all lesions emanating from the tip of the right atrial appendage were  not created.  The left atriotomy incision is closed using a two-layer  closure of running 3-0 Prolene suture.   Both proximal saphenous vein anastomoses were performed directly to the  ascending aorta prior to removal of the aortic crossclamp.  The left  ventricular septal temperature rises rapidly with reperfusion of the left  internal mammary artery.  The aortic crossclamp was removed after a total  crossclamp time of 99 minutes.  The right atriotomy incision was now closed  using a two layer closure of running 4-0 Prolene suture.  The heart began to  beat spontaneously without  need for cardioversion.  Epicardial pacing wires  were affixed to the right atrial free wall.  The patient is rewarmed to 37  degrees centigrade temperature.  The patient is weaned from cardiopulmonary  bypass without difficulty.  The patient's rhythm at separation from bypass  is a paced rhythm.  Total  cardiopulmonary bypass time for the operation is  132 minutes.  No inotropic support is required.   The venous and arterial cannulae are removed uneventfully.  Protamine is  administered to reverse the anticoagulation.  Follow-up transesophageal  echocardiogram performed by Dr. Noreene Larsson after separation from bypass  demonstrates normal left ventricular function.  There is no left sided the  tear at all.  No other abnormalities are noted.   The mediastinum and left chest are irrigated with saline solution containing  vancomycin.  Meticulous surgical hemostasis ascertained.  The mediastinum  and both left and right pleural spaces were drained using three chest tubes  exited through separate stab incisions inferiorly.  The median sternotomy is  closed using single strength sternal wire.  The soft tissues anterior to the  sternum are closed in multiple layers and the skin was closed with a running  subcuticular skin closure.   The patient tolerated the procedure well and was transported to the surgical  intensive care unit in stable condition.  There are no interoperative  complications.  All sponge, instrument and needle counts are verified  correct at the completion of the operation.  The patient was transfused 1  unit packed red blood cells during cardiopulmonary bypass due to anemia  which was present preoperatively and exacerbated by surgery.      Salvatore Decent. Cornelius Moras, M.D.  Electronically Signed     CHO/MEDQ  D:  07/11/2005  T:  07/11/2005  Job:  [id number]   cc:   Meade Maw, M.D.  Fax: [phone]   Stacie [place]. Cliffton Asters, M.D.  Fax: [phone]

## 2010-06-14 NOTE — Op Note (Signed)
NAME:  Valerie Carr, Valerie Carr                        ACCOUNT NO.:  [id number]   MEDICAL RECORD NO.:  [id number]                   PATIENT TYPE:  INP   LOCATION:  2111                                 FACILITY:  [facility]   PHYSICIAN:  Francisca December, M.D.               DATE OF BIRTH:  07-18-1929   DATE OF PROCEDURE:  03/25/2002  DATE OF DISCHARGE:                                 OPERATIVE REPORT   PROCEDURE:  1. Insertion of dual chamber permanent transvenous pacemaker.  2. Left subclavian venogram.   INDICATIONS FOR PROCEDURE:  The patient is a 77 year old woman who was  admitted yesterday after several episodes of prolonged chest pain.  During  telemetry monitoring, she spontaneously developed the onset of sinus arrest  with prolonged (12-second) asystole and subsequent near syncope. She also  had two five-second pauses.  She does have a history of lightheaded spells.  She is brought to the cardiac catheterization laboratory at this time for  insertion of a permanent transvenous pacemaker.   DESCRIPTION OF PROCEDURE:  The patient was brought to the cardiac  catheterization laboratory in the postabsorptive state. The left prepectoral  region was prepped and draped in the usual sterile fashion.  Local  anesthesia was obtained with the infiltration of 1% lidocaine with  epinephrine throughout the left prepectoral region. A left subclavian  venogram was performed with peripheral injection of 20 mL of Omnipaque. A  digital cine angiogram was obtained and subsequently road mapped to guide  future left subclavian puncture. The venogram did demonstrate the left  subclavian vein to be widely patent and coursing in a normal fashion over  the anterior surface of the first rib and beneath the middle third of the  clavicle.  There was no evidence for persistence of a left superior vena  cava.   A 6 to 7 cm incision was then made in the deltopectoral groove and this was  carried down by sharp  dissection and electrocautery to the prepectoral  fascia. There, a plane was lifted and a pocket formed inferiorly and  medially using blunt dissection and electrocautery.  The pocket was then  packed with a 1% Kanamycin soaked gauze.  Two separately left subclavian  punctures were then performed using an 18 gauge thin-wall needle, through  which was passed a 0.038 inch tight J guide wire.  Over the initial guide  wire, a 7 French tear away sheath and dilator were advanced. The dilator and  wire were removed. The right ventricular lead was advanced to the level of  the right atrium. The sheath was then torn away.  Utilizing standard  techniques and fluoroscopic landmarks, the lead was then manipulated into  the right ventricular apex. There excellent pacing parameters were obtained  as will be noted below. The lead was tested for diaphragmatic pacing with 10  volts and none was found. The lead was then sutured into place using  three  separately 0 silk ligatures.  Over the remaining guide wire, an 8 [place]  tear away sheath and dilator were advanced. The dilator was removed, the  wire was allowed to remain in place. The atrial lead was advanced to the  level of the right atrium. Again using standard technique and fluoroscopic  landmarks, the lead was manipulated into the right atrial appendage.  There  excellent pacing parameters were obtained as will be noted below. This was  an active fixation lead and it was screwed into place. It was tested for  diaphragmatic pacing at 10 volts and none was found. The lead was then  sutured into place using three separate 0 silk ligatures. The remaining  guide wire was removed, along with the Kanamycin-soaked gauze from the  pocket. The pocket was then copiously irrigated using 1% Kanamycin solution.  The leads were then attached to the pacing generator carefully identifying  each by its serial number and placing each into the appropriate receptacle.   Each lead was tightened into place and tested for security. The leads were  then wound beneath the pacing generator and the pacing generator was placed  in the pocket. The pocket was inspected for bleeding and none was found. The  pocket was then closed using 2-0 Dexon in a running fashion for the  subcutaneous layer. The skin was approximated using 5-0 Dexon in a running  subcuticular fashion.  Steri-Strips and a sterile dressing were applied and  the patient was transported to the recovery room in stable condition in an A-  sense, V-pace mode.   EQUIPMENT DATA:  The pacing generator is a [employer] DR model  [id number] serial [id number].  The atrial lead is a Guidant model [id number], serial  [id number].  The ventricular lead is a Guidant model [id number], serial [id number].   PACING DATA:  The ventricular lead detected a 5.9 millivolt R wave. The  pacing threshold was 0.5 volts at 0.5 milliseconds pulsewidth. The  resistance was 442 ohms resulting in a current of 0.8 MA. The atrial lead  detected a 2.0 millivolt P wave. The pacing threshold was 0.8 volts at 0.5  milliseconds pulsewidth.  The resistance was 464 ohms resulting in a current  of 1.5 MA.                                               Francisca December, M.D.    JHE/MEDQ  D:  03/25/2002  T:  03/25/2002  Job:  [id number]   cc:   Meade Maw, M.D.  [address]  [place]  [place] [address]  Fax: [phone]   Elana Alm. Nicholos Johns, M.D.  [address]  [place]  [place] [address]  Fax: [phone]

## 2010-06-14 NOTE — Op Note (Signed)
NAMEMAKENSEY, REGO              ACCOUNT NO.:  [id number]   MEDICAL RECORD NO.:  [id number]          PATIENT TYPE:  AMB   LOCATION:  ENDO                         FACILITY:  [facility]   PHYSICIAN:  John C. Madilyn Fireman, M.D.    DATE OF BIRTH:  [birth year]   DATE OF PROCEDURE:  01/05/2004  DATE OF DISCHARGE:                                 OPERATIVE REPORT   PROCEDURE:  Colonoscopy.   INDICATIONS FOR PROCEDURE:  Average-risk colon cancer screening in a 77-year-  old patient.   PROCEDURE:  The patient was placed in the left lateral decubitus position  and placed on the pulse monitor with continuous low flow oxygen delivered by  nasal cannula.  She was sedated with 50 mcg IV fentanyl and 6 mg IV Versed.  The Olympus video colonoscope was inserted into the rectum and advanced to  the cecum, confirmed by transillumination at McBurney's point and  visualization of the ileocecal valve and appendiceal orifice.  Prep is good.  The cecum, ascending, and transverse colon all appeared normal with no  masses, polyps, diverticula, or other mucosal abnormalities.  Within the  sigmoid colon, there were seen several scattered diverticula but no other  abnormalities.  The rectum appeared normal, and on retroflexed view, the  anus revealed no obvious internal hemorrhoids.  The scope was then  withdrawn, and the patient returned to the recovery room in stable  condition.  She tolerated the procedure well, and there were no immediate  complications.   IMPRESSION:  Sigmoid diverticulosis.   PLAN:  Next colon screening by sigmoidoscopy in five years.   IMPRESSION:  Normal colonoscopy.   PLAN:  Repeat study in five years.      JCH/MEDQ  D:  01/05/2004  T:  01/05/2004  Job:  [id number]   cc:   Stacie Acres. White, M.D.  [address]  [place]  [place] [address]  Fax: [phone]

## 2010-06-14 NOTE — Discharge Summary (Signed)
NAME:  Valerie, Carr                        ACCOUNT NO.:  [id number]   MEDICAL RECORD NO.:  [id number]                   PATIENT TYPE:  INP   LOCATION:  0483                                 FACILITY:  [facility]   PHYSICIAN:  Elana Alm. Nicholos Johns, M.D.               DATE OF BIRTH:  [birth year]   DATE OF ADMISSION:  08/29/2002  DATE OF DISCHARGE:  09/01/2002                                 DISCHARGE SUMMARY   HISTORY OF PRESENT ILLNESS:  This 77 year old white female known type 2  diabetic was admitted to [facility] on August 29, 2002, secondary  to recurrent cellulitis and abscess of the right lower quadrant of the  abdomen.   The patient had previously been hospitalized at [facility] in March 2004,  for insertion of dual chamber transvenous pacemaker by Dr. Amil Amen and  cardiac catheterization via Dr. Candace Cruise.  She did well and was infection  free at the time of discharge.  She was seen for various infections in her  office from that time until admission, which included external otitis and  left otitis media in June 2004, which she was treated with Cortisporin Otic  suspension and Augmentin for a 10 day course.  She was then seen July 19, 2002, for evaluation of discomfort and redness at the right suprapubic/right  lower quadrant abdominal region.  Despite the fact that the patient's ear  infections have since resolved on the Augmentin, the patient also began  experiencing recurrent ear discomfort along with the abdominal discomfort.  She was again started on Augmentin ES on two tabs p.o. b.i.d. with hot  soaks, but her symptoms did not markedly improved and she required I&D the  following day.  At that time, she received 1 g of Rocephin as well.  The  abscess apparently substantially improved within three days.   The patient had recurrent abscess in an area just appearing lateral to the  original site on August 08, 2002, and was again placed on Augmentin 875 mg one  p.o.  b.i.d., along with hot soaks.  The abscess seemed to completely  improve, but 10 days later the patient represented with right otitis media,  perforation, she was once again treated with Augmentin.   I saw the patient for the first time concerning these infections on August 26, 2002.  She presented with worsening abscess pain and swelling at the right  suprapubic/right lower quadrant region.  I gave the patient Rocephin 1 g IM  followed by Omnicef 300 mg two p.o. q.d. and I&D the site.  Scant exudate  and moderate blood was noted at time of I&D, and wound cultures were  obtained, and the site was then cleaned with hydrogen peroxide and packed  with 1/2 inch Iodoform gauze.  Several days later, the cultures returned  positive for methicillin-resistant Staphylococcus aureus, and I requested  follow up visit.  Upon followup,  the patient's right lower quadrant actually  showed evidence of extension of the infection bilaterally with some  worsening of discomfort and induration.  Because of the patient's worsening  symptoms and because of known resistance methicillin-resistant  Staphylococcus aureus can have to outpatient medications, she was admitted  for IV antibiotics.   PHYSICAL EXAMINATION:  Skin revealed evidence of a firm indurated area that  was approximately 10 x 3 cm across the right angle region extending up  through the right suprapubic and right lower quadrant of the abdomen.  There  was a 3 cm opening at the lateral aspect of the lesion that I had placed  during I&D several days before.  An Iodoform gauze was present at the  opening.  There was moderate pain to palpation, but no evidence of exudate.  The remainder of examination is as per H&P.  Please see chart.   LABORATORY DATA:  Admission CBC showed a white count of 6.6, with a H&H of  12.9 and 37.3.  Admission PT on Coumadin showed an INR of 1.2, and admission  CMET was within normal range with the exception of elevated  glucose of 157  in a non-fasting state.   HOSPITAL COURSE:  The patient was admitted to [facility]  with the diagnosis of MRSA abscess right lower quadrant of the abdomen  unresponsive to outpatient therapy.  IV of D5 and 1/2 normal saline at 100  cc was started and the patient was placed on vancomycin 1 g IV q.12h.  Additionally, the patient was maintained on all of her outpatient  medications (see admission history and physical).  Blood cultures x2 were  obtained and remained negative throughout her hospital stay.  Repeat wound  culture was obtained which confirmed the MRSA.  Wound care consultation was  requested and I elected to stay with my initial recommendations of wet-to-  dry dressing changes on a b.i.d. basis with normal saline.  The patient did  well during her hospital stay, and tolerated the IV vancomycin with no  difficulty.  On August 30, 2002, the patient underwent case management review  and was felt to be appropriate for IV antibiotics in an at home setting  because she had both Medicare and Medicaid.  During the patient's stay, her  wound site became less indurated and slightly smaller and less firm with  decreased discomfort to palpation.  There was also less erythema, and she  was felt to be clinically improving.  Outpatient IV medication option was  discussed with the patient and she agreed.  On August 31, 2002, the patient  noted a small amount of bleeding from her wound site at approximately 4  o'clock in the morning.  This was cleaned and redressed by the nurse.  On  rounds later that morning, the patient's wound site appeared to show less  induration, less erythema, and the remainder of the packing was removed from  the incisional site.  There was no evidence of active bleeding at that time  and dressing reapplied.  The patient later went down to radiology for placement of a PICC line which he tolerated well.  A few hours later, I was  paged,  informed by the floor nurse that the right lower quadrant wound site  had begun bleeding and was difficult to control despite direct pressure.  I  requested a stat surgical consult, and Dr. Luan Pulling came and evaluated the  patient and applied Surgicel packing which stopped the bleeding  easily.  The  patient had no recurrent bleeding from that point forward.  The patient's PT  was also re-evaluated and found to be only minimally changed with an  elevation of INR to 2.5, stable.  By September 01, 2002, the patient seemed to  be doing well, was up and around, had a good appetite, and complained of  only mild soreness at the PICC site and abdominal wound site.  The skin  examination revealed only minimally tender mild erythematous area at the  right lower quadrant with large bandage in place and no evidence of  bleeding.  PICC line appeared to be in place with no evidence of cellulitis.  The patient's CBG's had remained under good control during her hospital  stay, and blood cultures were all negative.  The patient was felt stable and  ready for discharge to receive IV vancomycin on an outpatient basis.   DISCHARGE DIAGNOSES:  1. Soft tissue methicillin-resistant Staphylococcus aureus infection,     resolving.  2. Type 2 diabetes, fair control.  3. Atherosclerotic cardiovascular disease/hypertension.  4. Depression.  5. Atrial fibrillation with pacemaker in place.  6. Hypercholesterolemia.  7. Memory loss.   CONDITION ON DISCHARGE:  Satisfactory.   DISCHARGE MEDICATIONS:  1. Vancomycin 1 g IV q.12h. for an additional seven days.  [facility] to help manage this outpatient therapy through the PICC line.  2. Avalide 300/12.5 mg one p.o. q.d.  3. Norvasc 5 mg one p.o. q.d.  4. Glucovance 1.25/250 one half tab p.o. b.i.d.  5. Paxil CR 12.5 mg p.o. q.d.  6. Trazodone 50 mg p.o. q.h.s.  7. Vivelle patch 0.05 mg every week.  8. Prevacid 30 mg one p.o. q.d.  9. Pravachol 80  mg one p.o. q.d.  10.      Coumadin 5 mg one p.o. q.d.   DISCHARGE INSTRUCTIONS:  The patient is to return home and receive IV  vancomycin q.12h. for an additional seven days as mentioned above.  She is  to remain on her outpatient medications, and follow up for re-evaluation  with me one week after discharge for final evaluation.  The patient will  contact me or her family members will contact me sooner as needed.                                                Robert A. Nicholos Johns, M.D.    RAR/MEDQ  D:  09/29/2002  T:  09/29/2002  Job:  [id number]

## 2010-06-14 NOTE — Consult Note (Signed)
Valerie Carr, GREENLEY NO.:  [id number]   MEDICAL RECORD NO.:  [id number]          PATIENT TYPE:  INP   LOCATION:  2312                         FACILITY:  [facility]   PHYSICIAN:  Kerin Perna, M.D.  DATE OF BIRTH:  04-11-1929   DATE OF CONSULTATION:  07/10/2005  DATE OF DISCHARGE:                                   CONSULTATION   REQUESTING PHYSICIAN:  Meade Maw, M.D.   PRIMARY CARE PHYSICIAN:  Stacie Acres. Cliffton Asters, M.D.   REASON FOR CONSULTATION:  Left main coronary artery stenosis with unstable  angina.   CHIEF COMPLAINT:  Chest pain and shortness of breath.   HISTORY OF PRESENT ILLNESS:  I was asked to evaluate this 77 year old white  female diabetic for potential surgical coronary revascularization for  recently diagnosed severe coronary artery disease.  The patient has a known  history of coronary artery disease and was last cathed in 2004, at which  time she had mild-to-moderate disease, treated medically.  At that time, she  had a transvenous dual-chamber pacemaker placed for brady arrhythmias.  Over  the past three weeks, she has had accelerating symptoms of exertional  shortness of breath and chest tightness and decreased exercise tolerance.  She was evaluated by Dr. Fraser Din in her office and scheduled for cardiac  catheterization today.  This demonstrated an 80% tubular stenosis of the  left main.  There is some ostial disease, which was felt to be a mild of the  right coronary artery.  LV systolic function was well preserved and LV end-  diastolic pressure was normal.  There is no evidence of valvular heart  disease.  Because of her coronary anatomy and symptoms, she is felt to be a  candidate for surgical revascularization.   PAST MEDICAL HISTORY:  1.  Type 2 diabetes mellitus.  2.  Hypertension.  3.  Depression.  4.  Surgical history positive for appendectomy, tonsillectomy, hysterectomy,      I&D of a perineal abscess, and placement of a  transvenous pacemaker.   HOME MEDICATIONS:  1.  Trazodone 100 mg q.h.s.  2.  Glucovance 250/1.25 daily.  3.  Coumadin (stopped four days ago).  4.  Benicar 20/12.5.  5.  Aspirin 81 mg daily.  6.  Cymbalta 60 mg daily.  7.  Protonix 40 mg daily.  8.  Midrin p.r.n. headache.   ALLERGIES/SENSITIVITIES:  She is GI-intolerant of CODEINE.  She does  tolerate DARVOCET.   SOCIAL HISTORY:  She lives with her son, who is disabled but is still  functioning and should be able to help assist in her care postoperatively.  She does not smoke cigarettes or use alcohol.  She quit smoking seven years  ago but did smoke one pack a day x40 years.   REVIEW OF SYSTEMS:  CONSTITUTIONAL:  Negative for fever or weight loss.  ENT:  Positive for total dental extraction with upper and lower dental  plates.  THORACIC:  Negative for chest trauma, abnormal chest x-ray,  pulmonary nodule, or symptoms of URI.  CARDIAC:  Positive for unstable  angina and left main stenosis.  GI:  Positive for diverticular disease,  gallstones, and mild GERD.  UROLOGIC:  Positive for some intermittent  hematuria of unknown significance.  HEMATOLOGIC:  Negative for prior blood  transfusion.  She does not have any significant bleeding with any of her  surgical procedures.  She does bruise easily and notes that she has been on  Coumadin for the past three years for apparent intermittent atrial  fibrillation, brady arrhythmia.  ENDOCRINE:  Positive for diabetes.  Negative for thyroid disease.  NEUROLOGIC/PSYCHOLOGIC:  Positive for  depression.  NEUROLOGIC:  Negative for stroke or seizure.  VASCULAR:  Negative for DVT, claudication, or TIA.   PHYSICAL EXAMINATION:  VITAL SIGNS:  Patient is 5 foot 6 and weighs 160  pounds.  Blood pressure 160/80, pulse 78 and regular, respirations 18,  saturations 98% on room air.  GENERAL APPEARANCE:  A thin elderly white female in the intensive care unit,  surrounded by family in no acute distress  but somewhat anxious.  HEENT:  Normocephalic.  NECK:  Without JVD, mass, or bruits.  LYMPHATICS:  No palpable, supraclavicular, or axillary adenopathy.  LUNGS:  Clear without deformity of the chest wall.  CARDIAC:  Regular rhythm without S3 gallop or murmur.  ABDOMEN:  Soft without pulsatile mass.  EXTREMITIES:  No clubbing, cyanosis or edema.  Peripheral pulses are 2+.  NEUROLOGIC:  Alert and oriented without focal motor deficit.  SKIN:  Without rash or ulceration.   LABORATORY DATA:  Reviewed the coronary arteriograms.  She has significant  left main stenosis with preserved LV function.   Her chest x-ray shows no active disease.   Her creatinine is 1.3 with a BUN of 20 and glucose of 127.  Hemoglobin A1C  is pending.  Her INR was 1.3.   IMPRESSION/PLAN:  The patient will be prepared for surgical  revascularization tomorrow morning, to be performed by Dr. Tressie Stalker.  The procedure was discussed in detail, including the indications, benefits,  alternatives and risks.  She understands and agrees to proceed with surgery  on June 15th.      Kerin Perna, M.D.  Electronically Signed     PV/MEDQ  D:  07/10/2005  T:  07/10/2005  Job:  [id number]

## 2010-06-14 NOTE — H&P (Signed)
NAME:  Valerie Carr, CABELL                        ACCOUNT NO.:  [id number]   MEDICAL RECORD NO.:  [id number]                   PATIENT TYPE:  INP   LOCATION:  0483                                 FACILITY:  [facility]   PHYSICIAN:  Elana Alm. Nicholos Johns, M.D.               DATE OF BIRTH:  [birth year]   DATE OF ADMISSION:  08/29/2002  DATE OF DISCHARGE:                                HISTORY & PHYSICAL   CHIEF COMPLAINT:  Abscess and cellulitis, right suprapubic region.   HISTORY OF PRESENT ILLNESS:  This 77 year old white female, a known type II  diabetic who is well known to me, presented with recurrent cellulitis and  abscess of the right lower quadrant of the abdomen/suprapubic region.   The patient was last hospitalized at [facility] March 28, 2002, for  insertion of dual chamber transvenous pacemaker by Francisca December, M.D.  March 25, 2002 and cardiac catheterization via Dr. Candace Cruise on  March 24, 2002.  The patient did well and was infection free until June 28, 2002, when she was seen in our office [facility]) for right  external otitis and left otitis media.  She was treated with Cortisporin  Otic and a 10 day course of Augmentin 875 mg one p.o. b.i.d.   The patient was next seen at [facility] on July 19, 2002,  for evaluation of treatment of a four day history of discomfort and redness  at the right suprapubic region.  The patient's ears had improved up until  two days prior to this evaluation.  The patient began to experience  recurrent otitis symptoms at that time as well.  She was started on  Augmentin ES two tablets b.i.d. with hot soaks, but symptoms did not  markedly improve, and she returned for I&D the following day.  At that time,  in addition to incision and drainage, she received 1 g of Rocephin IM.  Abscess substantially improved within three days.   The patient returned with recurrent abscess in an area just superior and  lateral to the original site on August 08, 2002.  Augmentin 875 mg one p.o.  b.i.d. was again initiated along with hot soaks.  Abscess completely  improved, and on August 18, 2002, 10 days later, the patient represented with  right otitis media with perforation.  She was once again treated with  Augmentin.   The patient returned for reevaluation on August 26, 2002, with worsening  abscess pain and swelling at the right suprapubic region.  I saw the patient  at this time and initiated Rocephin 1 g IM followed by Omnicef 300 mg two  p.o. daily along with I&D of the site.  Scant exudate and moderate blood was  noted at the time of I&D.  Site was cleaned with hydrogen peroxide and  packed with half inch Iodoform gauze.  Wound cultures were sent and  returned  today positive for methicillin-resistant Staphylococcus aureus.  Followup  office visit today showed the patient's area of induration actually  extending further away from the incisional site laterally.  With the  patient's worsening symptoms, and because of the known resistance of her  bacterial infection, she was admitted for IV antibiotics.   PAST MEDICAL HISTORY:  1. Type II diabetes.  2. Hypercholesterolemia.  3. Depression.  4. Episodic atrial fibrillation, currently controlled.  5. History of memory loss and confusion, episodic.  6. ASCVD.  7. Cholelithiasis.    PAST SURGICAL HISTORY:  1. T&A.  2. TAH/BSO.  3. Appendectomy.  4. Left cataract surgery.  5. Pacemaker placement for control of episodic atrial fibrillation.   FAMILY HISTORY:  Father with ASCVD died at age 27.  Sister with high blood  pressure.  Mother and two brothers with type 2 diabetes and brother with  throat cancer.   SOCIAL HISTORY:  The patient is widowed times 10 years and lives alone.  She  smoked up to one and a half packs of cigarettes per day and quite in the  year 2000.  She drinks two cups of coffee daily.   REVIEW OF SYSTEMS:  Negative after  comprehensive questioning with the  exception of general insomnia, occasional posterior headache and fatigue.  Eyes:  Diminished acuity with occasional blurred vision.  Ears:  Occasional  tinnitus with hearing loss.  Abdomen:  Episodic nausea and stomach pain.  Neurological:  Memory loss, occasional confusion, decreased energy and  strength.   MEDICATIONS:  1. Avalide 300/12.5 mg one p.o. daily.  2. Norvasc 5 mg one p.o. daily.  3. Glucovance 1.25/250 one half tablet p.o. b.i.d.  4. Paxil CR 12.5 mg p.o. daily.  5. Trazodone 50 mg one p.o. q.h.s.  6. Vivelle patch 0.05 mg on a weekly basis.  7. Prevacid 30 mg one p.o. daily.  8. Pravachol 80 mg one p.o. daily.  9. Coumadin 5 mg one p.o. daily.   ALLERGIES:  CODEINE (but patient tolerates Darvocet well).   PHYSICAL EXAMINATION:  VITAL SIGNS:  Pulse 86, respirations 20, blood  pressure 180/90 initially, pulse oximetry on room air 975.  Temperature was  not obtained because of MRSA precautions at the time of admission, but the  patient was afebrile earlier today and had no symptoms of fever.  GENERAL APPEARANCE:  The patient was alert and oriented, no acute distress  with occasional short-term memory problems during discussion.  Remote memory  was intact.  The patient denied any symptoms of depression or anxiety and  had an affect that was appropriate.  No delusions, hallucination, or  ideations present.  The patient's __________  appeared to be intact.  HEENT:  Normocephalic, atraumatic.  Eyes:  PERRL.  EOMs intact.  Mouth  benign.  Ears:  Right canal shows moderate erythema with mild swelling  present.  TM is clear.  Left TM with canal clear.  Nose clear.  Throat  negative.  NECK:  Supple.  Full range of motion without thyromegaly or adenopathy  noted.  LUNGS:  Clear to auscultation with good inspiratory and expiratory flow.  HEART:  Regular rate and rhythm without murmurs, rubs or gallops.  S1, S2 within normal limits.  ABDOMEN:   Soft, nondistended with oral bowel sounds.  Nontender to palpation  except for the area near the right suprapubic region.  GU/RECTAL:  Not indicated for this hospitalization.  SKIN:  There is evidence of a firm indurated area that was  approximately  10x3 cm across the right inguinal and right suprapubic region.  There was  also a 3 cm opening that I had placed several days ago at the lateral aspect  of this area and Iodoform gauze was present at the opening.  There was  moderate pain to palpation but no exudate was expressed.  EXTREMITIES:  No cyanosis, clubbing or edema present.  NEUROLOGICAL:  Cranial nerves 2-12 intact with motor and sensory intact.  Cerebellar intact.  DTRs +2/+4 bilaterally and equal at the patella and  biceps regions.   ADMISSION LABORATORY DATA:  CBC:  White count 6.6 with H&H of 12.9 and 37.3.  Platelet count 242,000.  CMET was completely within normal limits with the  exception of glucose elevated at 156 and calcium somewhat low at 8.3.  PT  was within therapeutic range with an INR of 2.1.  Blood cultures pending at  the time of dictation.   IMPRESSION:  1. Methicillin-resistant Staphylococcus aureus, cellulitis/abscess right     suprapubic region, unresponsive to outpatient treatment.  2. Type 2 diabetes.  3. Atherosclerotic coronary artery disease with hypertension.  4. Depression.  5. Atrial fibrillation with pacemaker placed.  6. Hypercholesterolemia.  7. Memory loss.   PLAN:  The case was discussed with Dr. Burnice Logan, Infection Disease.  Admission for IV Vancomycin felt to be indicated.  Wound care consult  obtained.  MRSA precautions instituted in private room setting.  Outpatient  medications to be continued.  Patient's CBC monitored b.i.d.  Blood cultures  x2 and reculture of wound site to be obtained.  Follow clinically.                                               Robert A. Nicholos Johns, M.D.    RAR/MEDQ  D:  08/30/2002  T:  08/30/2002  Job:   [id number]

## 2010-06-14 NOTE — Cardiovascular Report (Signed)
NAMESUA, SPADAFORA NO.:  [id number]   MEDICAL RECORD NO.:  [id number]          PATIENT TYPE:  INP   LOCATION:  2807                         FACILITY:  [facility]   PHYSICIAN:  Meade Maw, M.D.    DATE OF BIRTH:  1929-03-15   DATE OF PROCEDURE:  DATE OF DISCHARGE:                              CARDIAC CATHETERIZATION   INDICATIONS FOR PROCEDURE:  Ongoing chest pain and dyspnea   PROCEDURE:  After obtaining written informed consent, the patient was  brought to the cardiac catheterization lab in a post absorptive state.  Preoperative sedation was achieved using Versed 1 mg IV.  The right groin  was prepped and draped in usual sterile fashion.  Local anesthesia was  achieved using 1% Xylocaine.  A 6-French hemostasis sheath was placed into  the right femoral artery using a modified Seldinger technique.  Coronary  angiography was performed using a JL-5 and a non-torque right.  Single plane  ventriculogram was performed in the RAO position using a 6-French pigtail  curved catheter.  All catheter exchanges were made over a guidewire.  The  hemostasis sheath was flushed following each catheter engagement.  There was  critical left main disease identified.  The patient was transferred to the  holding area, and the films will be reviewed by Dr. Catalina Gravel for a second  opinion.  CVGS consult will be obtained.  The patient will be admitted to  step-down unit.  There were no immediate complications.   FINDINGS:  The aortic pressure is 150/97.  LV pressure was 150/70.  EDP is  10.  Single-plane ventriculogram revealed normal wall motion ejection  fraction of 65%.  There was no significant mitral regurgitation noted.  There was mild dilatation of the aortic root noted.   CORONARY ANGIOGRAPHY:  LEFT MAIN CORONARY ARTERY: The left main coronary  artery trifurcates into the left anterior descending ramus and  circumflex  vessel.  The left main coronary artery has a discrete  80% lesion.   LEFT ANTERIOR DESCENDING: Left anterior descending is a tortuous vessel that  gives rise to multiple small diagonals and ends as an apical recurrent  branch.  There is diffuse luminal irregularities in the left anterior  descending.  There is a 30-40% proximal lesion and a 40% mid vessel lesion  noted.  The ramus vessel was a small caliber vessel.  The circumflex vessel  is a large vessel that gives rise to trivial OM1, a small OM-2, and a larger  OM-3.  There was a 30-40% ostial lesion noted in the circumflex.   RIGHT CORONARY ARTERY: Right coronary artery is a large dominant artery that  gives rise to trivial RV marginals, a small PDA, and a large trifurcating  posterior lateral branch.  There was a 30-40% ostial lesion noted in the  right coronary artery.   FINAL IMPRESSION:  1.  Preserved systolic function, normal diastolic function.  2.  Critical left main disease.   RECOMMENDATIONS:  CVGS consult.      Meade Maw, M.D.     HP/MEDQ  D:  07/10/2005  T:  07/10/2005  Job:  [id number]

## 2010-06-14 NOTE — Cardiovascular Report (Signed)
NAME:  Valerie Carr, Valerie Carr                        ACCOUNT NO.:  [id number]   MEDICAL RECORD NO.:  [id number]                   PATIENT TYPE:  INP   LOCATION:  2111                                 FACILITY:  [facility]   PHYSICIAN:  Meade Maw, M.D.                 DATE OF BIRTH:  1929/06/04   DATE OF PROCEDURE:  03/24/2002  DATE OF DISCHARGE:                              CARDIAC CATHETERIZATION   REFERRING PHYSICIAN:  Molly Maduro A. Nicholos Johns, M.D.   INDICATIONS FOR PROCEDURE:  Chest pain, atrial fibrillation with a 12-second  asystolic event.   DESCRIPTION OF PROCEDURE:  After obtaining written informed consent, the  patient was brought to the cardiac catheterization laboratory in the  postabsorptive state. Preoperative sedation was achieved using Valium p.o.  The right groin was prepped and draped in the usual sterile fashion.  Local  anesthesia was achieved using 1% Xylocaine.  A 6 French hemostasis sheath  was placed into the right femoral artery using the modified Seldinger  technique.  Selective coronary angiography was performed using a JL4 and a  Non Torque right. Left heart catheterization was performed using a 6 French  pigtail curved catheter. Ventriculogram was not performed secondary to the  patient's hypotension.  She required Atropine 1 mg and a brief infusion of  dopamine secondary to nitroglycerin-induced hypotension.   FINDINGS:  The aortic pressure was 108/56, LV pressure is 118/0, EDP of 1.   CORONARY ANGIOGRAPHY:  1. Left main coronary artery:  The left main coronary artery trifurcates     into the left anterior descending, the ramus and the circumflex vessel.     The left main coronary artery is actually smaller in caliber in the left     anterior descending and the circumflex.  In one view there appeared to be     a 40% napkin-ring lesion. However, this may be secondary to the     positioning of the catheter.  2. Left anterior descending:  The left anterior descending  gives rise to a     small diagonal #1, small diagonal #2 and a moderate to large diagonal #3.     It goes on to end as an apical branch.  There is luminal irregularities     up to 40-50% in the mid LAD.  3. Circumflex vessel:  The circumflex vessel is a large caliber vessel.  It     gives rise to a small OM-1, a larger OM-2, large OM-3, then goes on to     end as a AV groove vessel.  There is a 50% proximal lesion noted in the     circumflex vessel.  4. Right coronary artery:  The right coronary artery is moderate sized     artery.  There was significant dampening of the ostium.  The patient was     given 200 mcg of intracoronary nitroglycerin.  She was noted to have two  RV marginals; the PDA and a PL branch there is a 70% ostial lesion noted     in the right coronary artery.   FINAL IMPRESSION:  Borderline disease in the left main mid left anterior  descending and proximal circumflex.  The films will be reviewed by Dr. Corliss Marcus.  Further recommendations pending his review.                                                  Meade Maw, M.D.   HP/MEDQ  D:  03/24/2002  T:  03/25/2002  Job:  [id number]

## 2010-07-11 ENCOUNTER — Other Ambulatory Visit: Payer: Self-pay | Admitting: Neurology

## 2010-07-11 DIAGNOSIS — R269 Unspecified abnormalities of gait and mobility: Secondary | ICD-10-CM

## 2010-07-11 DIAGNOSIS — R413 Other amnesia: Secondary | ICD-10-CM

## 2010-07-15 ENCOUNTER — Ambulatory Visit
Admission: RE | Admit: 2010-07-15 | Discharge: 2010-07-15 | Disposition: A | Discharge status: Home / Self Care | Payer: Medicare Other | Source: Ambulatory Visit | Attending: Neurology | Admitting: Neurology

## 2010-07-15 DIAGNOSIS — R269 Unspecified abnormalities of gait and mobility: Secondary | ICD-10-CM

## 2010-07-15 DIAGNOSIS — R413 Other amnesia: Secondary | ICD-10-CM

## 2010-07-15 MED ORDER — IOHEXOL 300 MG/ML  SOLN
75.0000 mL | Freq: Once | INTRAMUSCULAR | Status: AC | PRN
  Administered 2010-07-15: 75 mL via INTRAVENOUS

## 2010-12-31 ENCOUNTER — Ambulatory Visit
Admission: RE | Admit: 2010-12-31 | Discharge: 2010-12-31 | Disposition: A | Discharge status: Home / Self Care | Payer: Medicare Other | Source: Ambulatory Visit | Attending: Family Medicine | Admitting: Family Medicine

## 2010-12-31 ENCOUNTER — Other Ambulatory Visit: Payer: Self-pay | Admitting: Family Medicine

## 2010-12-31 DIAGNOSIS — R071 Chest pain on breathing: Secondary | ICD-10-CM

## 2011-01-06 ENCOUNTER — Encounter: Payer: Self-pay | Admitting: *Deleted

## 2011-01-06 ENCOUNTER — Other Ambulatory Visit: Payer: Self-pay | Admitting: *Deleted

## 2011-01-06 ENCOUNTER — Ambulatory Visit (INDEPENDENT_AMBULATORY_CARE_PROVIDER_SITE_OTHER): Payer: Medicare Other | Admitting: Internal Medicine

## 2011-01-06 ENCOUNTER — Encounter: Payer: Self-pay | Admitting: Internal Medicine

## 2011-01-06 VITALS — BP 166/76 | HR 72 | Ht 66.0 in | Wt 146.0 lb

## 2011-01-06 DIAGNOSIS — Z01812 Encounter for preprocedural laboratory examination: Secondary | ICD-10-CM

## 2011-01-06 DIAGNOSIS — Z7901 Long term (current) use of anticoagulants: Secondary | ICD-10-CM

## 2011-01-06 DIAGNOSIS — I498 Other specified cardiac arrhythmias: Secondary | ICD-10-CM

## 2011-01-06 DIAGNOSIS — R001 Bradycardia, unspecified: Secondary | ICD-10-CM | POA: Insufficient documentation

## 2011-01-06 DIAGNOSIS — I495 Sick sinus syndrome: Secondary | ICD-10-CM

## 2011-01-06 LAB — CBC WITH DIFFERENTIAL/PLATELET
Basophils Absolute: 0 10*3/uL (ref 0.0–0.1)
Eosinophils Absolute: 0.1 10*3/uL (ref 0.0–0.7)
MCHC: 34.7 g/dL (ref 30.0–36.0)
MCV: 91.4 fl (ref 78.0–100.0)
Monocytes Absolute: 0.6 10*3/uL (ref 0.1–1.0)
Neutrophils Relative %: 67.7 % (ref 43.0–77.0)
Platelets: 202 10*3/uL (ref 150.0–400.0)
WBC: 6.1 10*3/uL (ref 4.5–10.5)

## 2011-01-06 LAB — BASIC METABOLIC PANEL
BUN: 16 mg/dL (ref 6–23)
CO2: 29 mEq/L (ref 19–32)
Chloride: 103 mEq/L (ref 96–112)
Creatinine, Ser: 0.8 mg/dL (ref 0.4–1.2)

## 2011-01-06 LAB — PROTIME-INR
INR: 0.9 ratio (ref 0.8–1.0)
Prothrombin Time: 10.4 s (ref 10.2–12.4)

## 2011-01-06 MED ORDER — CEFAZOLIN SODIUM 1-5 GM-% IV SOLN: 1.0000 g | INTRAVENOUS | Status: AC

## 2011-01-06 MED ORDER — SODIUM CHLORIDE 0.9 % IR SOLN
80.0000 mg | Status: AC
  Administered 2011-01-07: 80 mg
  Filled 2011-01-06: qty 2

## 2011-01-06 NOTE — Patient Instructions (Addendum)
Your physician recommends that you have the generator in your [facility] [employer] pacemaker changed.  This procedure will be scheduled for 01/07/2011 at 2:30.  PLEASE arrive at 12:00 (NOON) for this procedure.    Nothing to eat or drink from Midnight the night before.  Have blood work done today for pre-procedure labs.  CBC, PT, PTT, BMET, Pre-procedure labs

## 2011-01-06 NOTE — Progress Notes (Signed)
Valerie Bradford, MD is PCP Primary Cardiologist: Valerie Carr is a 77 y.o. female with a h/o sinus pauses and presyncope sp PPM [facility] [employer]) by Valerie Amil Amen 2004  who presents today to establish care in the Electrophysiology device clinic.  The patient reports doing very well since having a pacemaker implanted and remains very active despite her age.   She has not had further presyncope or syncope since her pacemaker was implanted.  She has recently been found to have reached ERI battery status.  She presents today for consideration of generator change.  Today, she  denies symptoms of palpitations, exertional chest pain, shortness of breath, orthopnea, PND, lower extremity edema, dizziness, presyncope, syncope, or neurologic sequela. She has recently had atypical chest wall pain for which she is being evaluated by Valerie Cliffton Asters.  This has improved.  The patientis tolerating medications without difficulties and is otherwise without complaint today.   Past Medical History  Diagnosis Date  . Arteriosclerotic cardiovascular disease (ASCVD)   . DM type 2 (diabetes mellitus, type 2)   . HTN (hypertension)   . Chronic anxiety     and depression  . Gallstones     asysmptomatic  . Sick sinus syndrome     s/p PPM  . Hyperlipidemia    Past Surgical History  Procedure Date  . Coronary artery bypass graft     07-11-2005, triple bypass  . Pacemaker insertion 2004    [employer] PPM implanted by Valerie Amil Amen  . Total abdominal hysterectomy w/ bilateral salpingoophorectomy   . Appendectomy   . Tonsillectomy   . Esophagogastroduodenoscopy     History   Social History  . Marital Status: Widowed    Spouse Name: N/A    Number of Children: 3  . Years of Education: N/A   Occupational History  . Not on file.   Social History Main Topics  . Smoking status: Former Smoker -- 1.0 packs/day for 25 years  . Smokeless tobacco: Not on file   Comment: quit in 2001  . Alcohol Use: No    . Drug Use: No  . Sexually Active: Not on file   Other Topics Concern  . Not on file   Social History Narrative   widowed    Family History  Problem Relation Age of Onset  . Coronary artery disease Father   . Diabetes Mother   . Diabetes Brother   . Throat cancer Brother   . Diabetes Brother   . Hypertension Sister   . Cancer Son     esophageal    Allergies  Allergen Reactions  . Allegra   . Biaxin   . Codeine Phosphate   . Effexor (Venlafaxine Hydrochloride)   . Lisinopril   . Prozac (Fluoxetine Hcl)   . Septra (Bactrim)   . Wellbutrin (Bupropion Hcl)     Current Outpatient Prescriptions  Medication Sig Dispense Refill  . aspirin 81 MG tablet Take 81 mg by mouth daily.        . DULoxetine (CYMBALTA) 60 MG capsule Take 60 mg by mouth 2 (two) times daily.        Marland Kitchen estradiol (VIVELLE-DOT) 0.05 MG/24HR Place 1 patch onto the skin 2 (two) times a week.        Marland Kitchen LORazepam (ATIVAN) 0.5 MG tablet Take by mouth. Take one half to one tablet by mouth 3 times a day as needed       . metFORMIN (GLUCOPHAGE-XR) 500 MG 24 hr tablet  Take 500 mg by mouth daily with breakfast.        . olmesartan (BENICAR) 20 MG tablet Take 20 mg by mouth daily.        . rosuvastatin (CRESTOR) 40 MG tablet Take 10 mg by mouth daily.        . traMADol (ULTRAM) 50 MG tablet Take 50 mg by mouth as needed. Maximum dose= 8 tablets per day       . traZODone (DESYREL) 100 MG tablet Take 100 mg by mouth at bedtime.        . triamcinolone cream (KENALOG) 0.1 % Apply topically 2 (two) times daily.          ROS- all systems are reviewed and negative except as per HPI  Physical Exam: Filed Vitals:   01/06/11 1110  BP: 166/76  Pulse: 72  Height: 5\' 6"  (1.676 m)  Weight: 146 lb (66.225 kg)    GEN- The patient is well appearing, alert and oriented x 3 today.   Head- normocephalic, atraumatic Eyes-  Sclera clear, conjunctiva pink Ears- hearing intact Oropharynx- clear Neck- supple, no JVP Lymph- no  cervical lymphadenopathy Lungs- Clear to ausculation bilaterally, normal work of breathing Chest- pacemaker pocket is well healed Heart- Regular rate and rhythm, no murmurs, rubs or gallops, PMI not laterally displaced GI- soft, NT, ND, + BS Extremities- no clubbing, cyanosis, or edema MS- no significant deformity or atrophy Skin- no rash or lesion Psych- euthymic mood, full affect Neuro- strength and sensation are intact  Pacemaker interrogation- reviewed in detail today,  See PACEART report  Assessment and Plan:

## 2011-01-06 NOTE — Assessment & Plan Note (Signed)
The patient has symptomatic bradycardia/ sick sinus syndrome for which she had a [employer] PPM implanted by Dr Amil Amen.  Her PPM interrogation today reveals that she has reached ERI battery status.  I would therefore recommend pacemaker generator change at this time.  Risks, benefits, alternatives to pacemaker generator change were discussed in detail with the patient today. The patient understands that the risks include but are not limited to bleeding, infection, and lead damage requiring lead revision.  She wishes to proceed. We will therefore schedule the procedure at the next available time.

## 2011-01-07 ENCOUNTER — Encounter (HOSPITAL_COMMUNITY)
Admission: RE | Disposition: A | Discharge status: Home / Self Care | Payer: Self-pay | Source: Ambulatory Visit | Attending: Internal Medicine

## 2011-01-07 ENCOUNTER — Ambulatory Visit (HOSPITAL_COMMUNITY)
Admission: RE | Admit: 2011-01-07 | Discharge: 2011-01-07 | Disposition: A | Discharge status: Home / Self Care | Payer: Medicare Other | Source: Ambulatory Visit | Attending: Internal Medicine | Admitting: Internal Medicine

## 2011-01-07 DIAGNOSIS — I2510 Atherosclerotic heart disease of native coronary artery without angina pectoris: Secondary | ICD-10-CM | POA: Insufficient documentation

## 2011-01-07 DIAGNOSIS — F329 Major depressive disorder, single episode, unspecified: Secondary | ICD-10-CM | POA: Insufficient documentation

## 2011-01-07 DIAGNOSIS — E785 Hyperlipidemia, unspecified: Secondary | ICD-10-CM | POA: Insufficient documentation

## 2011-01-07 DIAGNOSIS — I495 Sick sinus syndrome: Secondary | ICD-10-CM

## 2011-01-07 DIAGNOSIS — R001 Bradycardia, unspecified: Secondary | ICD-10-CM | POA: Insufficient documentation

## 2011-01-07 DIAGNOSIS — F3289 Other specified depressive episodes: Secondary | ICD-10-CM | POA: Insufficient documentation

## 2011-01-07 DIAGNOSIS — Z45018 Encounter for adjustment and management of other part of cardiac pacemaker: Secondary | ICD-10-CM | POA: Insufficient documentation

## 2011-01-07 DIAGNOSIS — T82190A Other mechanical complication of cardiac electrode, initial encounter: Secondary | ICD-10-CM

## 2011-01-07 DIAGNOSIS — I10 Essential (primary) hypertension: Secondary | ICD-10-CM | POA: Insufficient documentation

## 2011-01-07 DIAGNOSIS — E119 Type 2 diabetes mellitus without complications: Secondary | ICD-10-CM | POA: Insufficient documentation

## 2011-01-07 DIAGNOSIS — K8020 Calculus of gallbladder without cholecystitis without obstruction: Secondary | ICD-10-CM | POA: Insufficient documentation

## 2011-01-07 DIAGNOSIS — Z951 Presence of aortocoronary bypass graft: Secondary | ICD-10-CM | POA: Insufficient documentation

## 2011-01-07 DIAGNOSIS — F411 Generalized anxiety disorder: Secondary | ICD-10-CM | POA: Insufficient documentation

## 2011-01-07 HISTORY — PX: PERMANENT PACEMAKER GENERATOR CHANGE: SHX6022

## 2011-01-07 LAB — GLUCOSE, CAPILLARY: Glucose-Capillary: 122 mg/dL — ABNORMAL HIGH (ref 70–99)

## 2011-01-07 SURGERY — PERMANENT PACEMAKER GENERATOR CHANGE
Anesthesia: LOCAL

## 2011-01-07 MED ORDER — SODIUM CHLORIDE 0.9 % IJ SOLN: 3.0000 mL | INTRAMUSCULAR | Status: DC | PRN

## 2011-01-07 MED ORDER — MUPIROCIN 2 % EX OINT
TOPICAL_OINTMENT | CUTANEOUS | Status: AC
Start: 2011-01-07 — End: 2011-01-07
  Administered 2011-01-07: 1
  Filled 2011-01-07: qty 22

## 2011-01-07 MED ORDER — SODIUM CHLORIDE 0.9 % IV SOLN
INTRAVENOUS | Status: DC
  Administered 2011-01-07: 14:00:00 via INTRAVENOUS

## 2011-01-07 MED ORDER — ACETAMINOPHEN 325 MG PO TABS: 325.0000 mg | ORAL_TABLET | ORAL | Status: DC | PRN

## 2011-01-07 MED ORDER — MIDAZOLAM HCL 5 MG/5ML IJ SOLN
INTRAMUSCULAR | Status: AC
  Filled 2011-01-07: qty 5

## 2011-01-07 MED ORDER — ACETAMINOPHEN 500 MG PO TABS
ORAL_TABLET | ORAL | Status: AC
  Filled 2011-01-07: qty 1

## 2011-01-07 MED ORDER — CEFAZOLIN SODIUM 1-5 GM-% IV SOLN
INTRAVENOUS | Status: AC
  Filled 2011-01-07: qty 50

## 2011-01-07 MED ORDER — ACETAMINOPHEN 500 MG PO TABS: 1000.0000 mg | ORAL_TABLET | Freq: Four times a day (QID) | ORAL | Status: DC

## 2011-01-07 MED ORDER — CEFAZOLIN SODIUM 1-5 GM-% IV SOLN
1.0000 g | Freq: Once | INTRAVENOUS | Status: AC
  Administered 2011-01-07: 1 g via INTRAVENOUS
  Filled 2011-01-07 (×2): qty 50

## 2011-01-07 MED ORDER — ACETAMINOPHEN 500 MG PO TABS: 500.0000 mg | ORAL_TABLET | Freq: Once | ORAL | Status: DC

## 2011-01-07 MED ORDER — FENTANYL CITRATE 0.05 MG/ML IJ SOLN
INTRAMUSCULAR | Status: AC
  Filled 2011-01-07: qty 2

## 2011-01-07 MED ORDER — CHLORHEXIDINE GLUCONATE 4 % EX LIQD: 60.0000 mL | Freq: Once | CUTANEOUS | Status: DC

## 2011-01-07 MED ORDER — GENTAMICIN SULFATE 40 MG/ML IJ SOLN
Freq: Once | INTRAMUSCULAR | Status: DC
  Filled 2011-01-07: qty 2

## 2011-01-07 MED ORDER — LIDOCAINE HCL (PF) 1 % IJ SOLN
INTRAMUSCULAR | Status: AC
  Filled 2011-01-07: qty 60

## 2011-01-07 MED ORDER — SODIUM CHLORIDE 0.9 % IJ SOLN: 3.0000 mL | Freq: Two times a day (BID) | INTRAMUSCULAR | Status: DC

## 2011-01-07 MED ORDER — ONDANSETRON HCL 4 MG/2ML IJ SOLN: 4.0000 mg | Freq: Four times a day (QID) | INTRAMUSCULAR | Status: DC | PRN

## 2011-01-07 NOTE — Brief Op Note (Signed)
01/07/2011  3:52 PM  PATIENT:  Valerie Carr  77 y.o. female  PRE-OPERATIVE DIAGNOSIS:  End of life  POST-OPERATIVE DIAGNOSIS:  * No post-op diagnosis entered *  PROCEDURE:  Procedure(s): PERMANENT PACEMAKER GENERATOR CHANGE  SURGEON:  Surgeon(s): Gardiner Rhyme, MD  PHYSICIAN ASSISTANT:   ASSISTANTS: none   ANESTHESIA:   IV sedation  EBL:   0cc  BLOOD ADMINISTERED:none  DRAINS: none   LOCAL MEDICATIONS USED:  LIDOCAINE 5CC  SPECIMEN:  No Specimen  DISPOSITION OF SPECIMEN:  N/A  COUNTS:  YES  TOURNIQUET:  * No tourniquets in log *  DICTATION: .Note written in EPIC  PLAN OF CARE: Discharge to home after PACU  PATIENT DISPOSITION:  Short Stay   Delay start of Pharmacological VTE agent (>24hrs) due to surgical blood loss or risk of bleeding:  {YES/NO/NOT APPLICABLE:20182

## 2011-01-07 NOTE — Op Note (Signed)
SURGEON:  Hillis Range, MD     PREPROCEDURE DIAGNOSES:   1. Sick sinus syndrome.     POSTPROCEDURE DIAGNOSES:   1. Sick sinus syndrome.     PROCEDURES:   1. Pacemaker pulse generator replacement.   2. Skin pocket revision.   3. Single lead repair    INTRODUCTION:  Valerie Carr is a 77 y.o. female with a history of sick sinus syndrome. She has done well since his pacemaker was implanted.  She has recently reached ERI battery status.  She presents today for pacemaker pulse generator replacement.       DESCRIPTION OF THE PROCEDURE:  Informed written consent was obtained, and the patient was brought to the electrophysiology lab in the fasting state.  The patient's pacemaker was interrogated today and found to be at elective replacement indicator battery status.  The patient was adequately sedated with intravenous Versed as outlined in the nursing report.  The patient's left chest was prepped and draped in the usual sterile fashion by the EP lab staff.  The skin overlying the existing pacemaker was infiltrated with lidocaine for local analgesia.  A 4-cm incision was made over the pacemaker pocket.  Using a combination of sharp and blunt dissection, the pacemaker was exposed and removed from the body.  The device was disconnected from the leads. There was no foreign matter or debris within the pocket.  The atrial lead was confirmed to be a guidant model [id number] (serial [id number]) lead implanted on 03/25/2002.  The right ventricular lead was confirmed to be a Guidant model 4458 (serial number [id number]) lead implanted on the same date as the atrial lead (above).  Both leads were examined and their integrity was confirmed to be intact.  There was noticeable heme within the insulation of the ventricular lead, however, I did not appreciate an obvious breach of insulation.  I elected to therefore repair the lead with silicone.  Silicone was gently applied to the proximal 4cm of the ventricular lead.  Atrial  lead P-waves measured 1.6 mV with impedance of 308 ohms and a threshold of 0.6 V at 0.67msec.  Right ventricular lead R-waves measured 9.6 mV with impedance of 415 ohms and a threshold of 0.6 V at 0.60msec.  Both leads were connected to a [employer] model [id number] DR  (serial number [id number]) pacemaker.  The pocket was revised to accommodate this new device.  Electrocautery was required to assure hemostasis.  The pocket was irrigated with copious gentamicin solution. The pacemaker was then placed into the pocket.  The pocket was then closed in 2 layers with 2-0 Vicryl suture over the subcutaneous and subcuticular layers.  Steri-Strips and a sterile dressing were then applied.  There were no early apparent complications.     CONCLUSIONS:   1. Successful pacemaker pulse generator replacement for elective replacement indicator battery status   2. No early apparent complications.     Hillis Range, MD 01/07/2011 3:53 PM

## 2011-01-07 NOTE — H&P (View-Only) (Signed)
Carr,Valerie S, MD is PCP Primary Cardiologist: Dr [name]  Valerie Carr is a 77 y.o. female with a h/o sinus pauses and presyncope sp PPM ([employer]) by Dr Edmunds 2004  who presents today to establish care in the Electrophysiology device clinic.  The patient reports doing very well since having a pacemaker implanted and remains very active despite her age.   She has not had further presyncope or syncope since her pacemaker was implanted.  She has recently been found to have reached ERI battery status.  She presents today for consideration of generator change.  Today, she  denies symptoms of palpitations, exertional chest pain, shortness of breath, orthopnea, PND, lower extremity edema, dizziness, presyncope, syncope, or neurologic sequela. She has recently had atypical chest wall pain for which she is being evaluated by Dr Carr.  This has improved.  The patientis tolerating medications without difficulties and is otherwise without complaint today.   Past Medical History  Diagnosis Date  . Arteriosclerotic cardiovascular disease (ASCVD)   . DM type 2 (diabetes mellitus, type 2)   . HTN (hypertension)   . Chronic anxiety     and depression  . Gallstones     asysmptomatic  . Sick sinus syndrome     s/p PPM  . Hyperlipidemia    Past Surgical History  Procedure Date  . Coronary artery bypass graft     07-11-2005, triple bypass  . Pacemaker insertion 2004    [employer] PPM implanted by Dr Edmunds  . Total abdominal hysterectomy w/ bilateral salpingoophorectomy   . Appendectomy   . Tonsillectomy   . Esophagogastroduodenoscopy     History   Social History  . Marital Status: Widowed    Spouse Name: N/A    Number of Children: 3  . Years of Education: N/A   Occupational History  . Not on file.   Social History Main Topics  . Smoking status: Former Smoker -- 1.0 packs/day for 25 years  . Smokeless tobacco: Not on file   Comment: quit in 2001  . Alcohol Use: No    . Drug Use: No  . Sexually Active: Not on file   Other Topics Concern  . Not on file   Social History Narrative   widowed    Family History  Problem Relation Age of Onset  . Coronary artery disease Father   . Diabetes Mother   . Diabetes Brother   . Throat cancer Brother   . Diabetes Brother   . Hypertension Sister   . Cancer Son     esophageal    Allergies  Allergen Reactions  . Allegra   . Biaxin   . Codeine Phosphate   . Effexor (Venlafaxine Hydrochloride)   . Lisinopril   . Prozac (Fluoxetine Hcl)   . Septra (Bactrim)   . Wellbutrin (Bupropion Hcl)     Current Outpatient Prescriptions  Medication Sig Dispense Refill  . aspirin 81 MG tablet Take 81 mg by mouth daily.        . DULoxetine (CYMBALTA) 60 MG capsule Take 60 mg by mouth 2 (two) times daily.        . estradiol (VIVELLE-DOT) 0.05 MG/24HR Place 1 patch onto the skin 2 (two) times a week.        . LORazepam (ATIVAN) 0.5 MG tablet Take by mouth. Take one half to one tablet by mouth 3 times a day as needed       . metFORMIN (GLUCOPHAGE-XR) 500 MG 24 hr tablet   Take 500 mg by mouth daily with breakfast.        . olmesartan (BENICAR) 20 MG tablet Take 20 mg by mouth daily.        . rosuvastatin (CRESTOR) 40 MG tablet Take 10 mg by mouth daily.        . traMADol (ULTRAM) 50 MG tablet Take 50 mg by mouth as needed. Maximum dose= 8 tablets per day       . traZODone (DESYREL) 100 MG tablet Take 100 mg by mouth at bedtime.        . triamcinolone cream (KENALOG) 0.1 % Apply topically 2 (two) times daily.          ROS- all systems are reviewed and negative except as per HPI  Physical Exam: Filed Vitals:   01/06/11 1110  BP: 166/76  Pulse: 72  Height: 5' 6" (1.676 m)  Weight: 146 lb (66.225 kg)    GEN- The patient is well appearing, alert and oriented x 3 today.   Head- normocephalic, atraumatic Eyes-  Sclera clear, conjunctiva pink Ears- hearing intact Oropharynx- clear Neck- supple, no JVP Lymph- no  cervical lymphadenopathy Lungs- Clear to ausculation bilaterally, normal work of breathing Chest- pacemaker pocket is well healed Heart- Regular rate and rhythm, no murmurs, rubs or gallops, PMI not laterally displaced GI- soft, NT, ND, + BS Extremities- no clubbing, cyanosis, or edema MS- no significant deformity or atrophy Skin- no rash or lesion Psych- euthymic mood, full affect Neuro- strength and sensation are intact  Pacemaker interrogation- reviewed in detail today,  See PACEART report  Assessment and Plan:   

## 2011-01-07 NOTE — Interval H&P Note (Signed)
History and Physical Interval Note:  01/07/2011 2:00 PM  Valerie Carr  has presented today for surgery, with the diagnosis of End of life pacemaker battery status/ bradycardia.  The various methods of treatment have been discussed with the patient and family. After consideration of risks, benefits and other options for treatment, the patient has consented to  Procedure(s): PERMANENT PACEMAKER GENERATOR CHANGE as a surgical intervention .  The patients' history has been reviewed, patient examined, no change in status, stable for surgery.  I have reviewed the patients' chart and labs.  Questions were answered to the patient's satisfaction.     Hillis Range

## 2011-01-09 ENCOUNTER — Telehealth: Payer: Self-pay | Admitting: Internal Medicine

## 2011-01-09 NOTE — Telephone Encounter (Signed)
New problem:  C/O itching / rash  around site.

## 2011-01-09 NOTE — Telephone Encounter (Signed)
Spoke with pt and pt's main concern is some drainage that was old. No new drainage from site. Pt thinks itching is coming from dressing. Pt instructed to keep check on drainage and if any new drainage occurs to call office. Pt understands. Pt scheduled for wound check 01-15-11 and pt aware of appt.

## 2011-01-15 ENCOUNTER — Ambulatory Visit (INDEPENDENT_AMBULATORY_CARE_PROVIDER_SITE_OTHER): Payer: Medicare Other | Admitting: *Deleted

## 2011-01-15 ENCOUNTER — Encounter: Payer: Self-pay | Admitting: Internal Medicine

## 2011-01-15 DIAGNOSIS — R001 Bradycardia, unspecified: Secondary | ICD-10-CM

## 2011-01-15 DIAGNOSIS — I498 Other specified cardiac arrhythmias: Secondary | ICD-10-CM

## 2011-01-15 LAB — PACEMAKER DEVICE OBSERVATION
AL AMPLITUDE: 1.4 mv
AL IMPEDENCE PM: 334 Ohm
ATRIAL PACING PM: 93
DEVICE MODEL PM: 120275
RV LEAD AMPLITUDE: 11.9 mv
VENTRICULAR PACING PM: 9

## 2011-01-15 NOTE — Progress Notes (Signed)
Wound check-PPM 

## 2011-02-24 DIAGNOSIS — R21 Rash and other nonspecific skin eruption: Secondary | ICD-10-CM | POA: Diagnosis not present

## 2011-03-31 DIAGNOSIS — R071 Chest pain on breathing: Secondary | ICD-10-CM | POA: Diagnosis not present

## 2011-03-31 DIAGNOSIS — E1129 Type 2 diabetes mellitus with other diabetic kidney complication: Secondary | ICD-10-CM | POA: Diagnosis not present

## 2011-03-31 DIAGNOSIS — N8111 Cystocele, midline: Secondary | ICD-10-CM | POA: Diagnosis not present

## 2011-03-31 DIAGNOSIS — I129 Hypertensive chronic kidney disease with stage 1 through stage 4 chronic kidney disease, or unspecified chronic kidney disease: Secondary | ICD-10-CM | POA: Diagnosis not present

## 2011-03-31 DIAGNOSIS — F341 Dysthymic disorder: Secondary | ICD-10-CM | POA: Diagnosis not present

## 2011-03-31 DIAGNOSIS — E785 Hyperlipidemia, unspecified: Secondary | ICD-10-CM | POA: Diagnosis not present

## 2011-04-04 DIAGNOSIS — H16229 Keratoconjunctivitis sicca, not specified as Sjogren's, unspecified eye: Secondary | ICD-10-CM | POA: Diagnosis not present

## 2011-04-04 DIAGNOSIS — H353190 Nonexudative age-related macular degeneration, unspecified eye, stage unspecified: Secondary | ICD-10-CM | POA: Diagnosis not present

## 2011-04-04 DIAGNOSIS — H5200 Hypermetropia, unspecified eye: Secondary | ICD-10-CM | POA: Diagnosis not present

## 2011-04-04 DIAGNOSIS — E119 Type 2 diabetes mellitus without complications: Secondary | ICD-10-CM | POA: Diagnosis not present

## 2011-04-09 ENCOUNTER — Ambulatory Visit (INDEPENDENT_AMBULATORY_CARE_PROVIDER_SITE_OTHER): Payer: Medicare Other | Admitting: Internal Medicine

## 2011-04-09 ENCOUNTER — Encounter: Payer: Self-pay | Admitting: Internal Medicine

## 2011-04-09 VITALS — BP 112/60 | HR 72 | Resp 18 | Ht 66.0 in | Wt 142.8 lb

## 2011-04-09 DIAGNOSIS — I498 Other specified cardiac arrhythmias: Secondary | ICD-10-CM

## 2011-04-09 DIAGNOSIS — R001 Bradycardia, unspecified: Secondary | ICD-10-CM

## 2011-04-09 LAB — PACEMAKER DEVICE OBSERVATION
ATRIAL PACING PM: 78
DEVICE MODEL PM: 120275
RV LEAD IMPEDENCE PM: 484 Ohm
RV LEAD THRESHOLD: 0.7 V
VENTRICULAR PACING PM: 3

## 2011-04-09 NOTE — Assessment & Plan Note (Signed)
Normal pacemaker function See Arita Miss Art report No changes today  Doing well s/p generator change I will turn her pacemaker follow-up over to Dr Eldridge Dace I will see her as needed.

## 2011-04-09 NOTE — Progress Notes (Signed)
Patient ID: Valerie Carr, female   DOB: 31-Dec-1929, 78 y.o.   MRN: [id number]  PCP:  Cala Bradford, MD, MD Primary Cardiologist:  Dr Eldridge Dace  The patient presents today for routine electrophysiology followup.  Since her recent generator change, the patient reports doing very well.  She denies procedure related complications. Today, she denies symptoms of palpitations, chest pain, shortness of breath,dizziness, presyncope, syncope, or neurologic sequela.  The patient feels that she is tolerating medications without difficulties and is otherwise without complaint today.   Past Medical History  Diagnosis Date  . Arteriosclerotic cardiovascular disease (ASCVD)   . DM type 2 (diabetes mellitus, type 2)   . HTN (hypertension)   . Chronic anxiety     and depression  . Gallstones     asysmptomatic  . Sick sinus syndrome     s/p PPM  . Hyperlipidemia    Past Surgical History  Procedure Date  . Coronary artery bypass graft     07-11-2005, triple bypass  . Pacemaker insertion 2004    [employer] PPM implanted by Dr Amil Amen, generator change by Dr Johney Frame 01/07/11  . Total abdominal hysterectomy w/ bilateral salpingoophorectomy   . Appendectomy   . Tonsillectomy   . Esophagogastroduodenoscopy     Current Outpatient Prescriptions  Medication Sig Dispense Refill  . aspirin 325 MG tablet Take 325 mg by mouth daily.      . DULoxetine (CYMBALTA) 60 MG capsule Take 60 mg by mouth 2 (two) times daily.       Marland Kitchen LORazepam (ATIVAN) 0.5 MG tablet Take by mouth. Take one half to one tablet by mouth 3 times a day as needed      . metFORMIN (GLUCOPHAGE-XR) 500 MG 24 hr tablet Take 500 mg by mouth daily with breakfast.        . olmesartan (BENICAR) 20 MG tablet Take 20 mg by mouth daily.        . rosuvastatin (CRESTOR) 40 MG tablet Take 20 mg by mouth daily.       . traZODone (DESYREL) 100 MG tablet Take 100 mg by mouth at bedtime.        . triamcinolone cream (KENALOG) 0.1 % Apply topically 2  (two) times daily.        Marland Kitchen estradiol (VIVELLE-DOT) 0.05 MG/24HR Place 1 patch onto the skin 2 (two) times a week.        . traMADol (ULTRAM) 50 MG tablet Take 50 mg by mouth as needed. Maximum dose= 8 tablets per day         Allergies  Allergen Reactions  . Allegra   . Biaxin   . Codeine Phosphate   . Effexor (Venlafaxine Hydrochloride)   . Lisinopril   . Prozac (Fluoxetine Hcl)   . Septra (Bactrim)   . Wellbutrin (Bupropion Hcl)     History   Social History  . Marital Status: Widowed    Spouse Name: N/A    Number of Children: 3  . Years of Education: N/A   Occupational History  . Not on file.   Social History Main Topics  . Smoking status: Former Smoker -- 1.0 packs/day for 25 years  . Smokeless tobacco: Not on file   Comment: quit in 2001  . Alcohol Use: No  . Drug Use: No  . Sexually Active: Not on file   Other Topics Concern  . Not on file   Social History Narrative   widowed    Family History  Problem Relation  Age of Onset  . Coronary artery disease Father   . Diabetes Mother   . Diabetes Brother   . Throat cancer Brother   . Diabetes Brother   . Hypertension Sister   . Cancer Son     esophageal    Physical Exam: Filed Vitals:   04/09/11 1121  BP: 112/60  Pulse: 72  Resp: 18  Height: 5\' 6"  (1.676 m)  Weight: 64.774 kg (142 lb 12.8 oz)    GEN- The patient is well appearing, alert and oriented x 3 today.   Head- normocephalic, atraumatic Eyes-  Sclera clear, conjunctiva pink Ears- hearing intact Oropharynx- clear Neck- supple,  Lungs- Clear to ausculation bilaterally, normal work of breathing Chest- pacemaker pocket is well healed Heart- Regular rate and rhythm, no murmurs, rubs or gallops, PMI not laterally displaced GI- soft, NT, ND, + BS Extremities- no clubbing, cyanosis, or edema  Pacemaker interrogation- reviewed in detail today,  See PACEART report  Assessment and Plan:

## 2011-04-09 NOTE — Patient Instructions (Addendum)
Your physician recommends that you schedule a follow-up appointment as needed with Dr Allred   

## 2011-04-28 DIAGNOSIS — R32 Unspecified urinary incontinence: Secondary | ICD-10-CM | POA: Diagnosis not present

## 2011-04-28 DIAGNOSIS — N8111 Cystocele, midline: Secondary | ICD-10-CM | POA: Diagnosis not present

## 2011-05-28 DIAGNOSIS — R32 Unspecified urinary incontinence: Secondary | ICD-10-CM | POA: Diagnosis not present

## 2011-05-28 DIAGNOSIS — N8111 Cystocele, midline: Secondary | ICD-10-CM | POA: Diagnosis not present

## 2011-06-26 DIAGNOSIS — R3915 Urgency of urination: Secondary | ICD-10-CM | POA: Diagnosis not present

## 2011-06-26 DIAGNOSIS — N3946 Mixed incontinence: Secondary | ICD-10-CM | POA: Diagnosis not present

## 2011-06-26 DIAGNOSIS — N8111 Cystocele, midline: Secondary | ICD-10-CM | POA: Diagnosis not present

## 2011-07-09 DIAGNOSIS — N8111 Cystocele, midline: Secondary | ICD-10-CM | POA: Diagnosis not present

## 2011-07-09 DIAGNOSIS — R3915 Urgency of urination: Secondary | ICD-10-CM | POA: Diagnosis not present

## 2011-07-09 DIAGNOSIS — N3946 Mixed incontinence: Secondary | ICD-10-CM | POA: Diagnosis not present

## 2011-08-28 DIAGNOSIS — F329 Major depressive disorder, single episode, unspecified: Secondary | ICD-10-CM | POA: Diagnosis not present

## 2011-08-28 DIAGNOSIS — R634 Abnormal weight loss: Secondary | ICD-10-CM | POA: Diagnosis not present

## 2011-08-28 DIAGNOSIS — E785 Hyperlipidemia, unspecified: Secondary | ICD-10-CM | POA: Diagnosis not present

## 2011-09-04 DIAGNOSIS — H10029 Other mucopurulent conjunctivitis, unspecified eye: Secondary | ICD-10-CM | POA: Diagnosis not present

## 2011-09-11 DIAGNOSIS — H10029 Other mucopurulent conjunctivitis, unspecified eye: Secondary | ICD-10-CM | POA: Diagnosis not present

## 2011-10-21 DIAGNOSIS — H16149 Punctate keratitis, unspecified eye: Secondary | ICD-10-CM | POA: Diagnosis not present

## 2011-10-21 DIAGNOSIS — H10539 Contact blepharoconjunctivitis, unspecified eye: Secondary | ICD-10-CM | POA: Diagnosis not present

## 2011-10-30 DIAGNOSIS — E1159 Type 2 diabetes mellitus with other circulatory complications: Secondary | ICD-10-CM | POA: Diagnosis not present

## 2011-10-30 DIAGNOSIS — E785 Hyperlipidemia, unspecified: Secondary | ICD-10-CM | POA: Diagnosis not present

## 2011-10-30 DIAGNOSIS — E1129 Type 2 diabetes mellitus with other diabetic kidney complication: Secondary | ICD-10-CM | POA: Diagnosis not present

## 2011-10-30 DIAGNOSIS — I739 Peripheral vascular disease, unspecified: Secondary | ICD-10-CM | POA: Diagnosis not present

## 2011-10-30 DIAGNOSIS — F341 Dysthymic disorder: Secondary | ICD-10-CM | POA: Diagnosis not present

## 2011-10-30 DIAGNOSIS — I129 Hypertensive chronic kidney disease with stage 1 through stage 4 chronic kidney disease, or unspecified chronic kidney disease: Secondary | ICD-10-CM | POA: Diagnosis not present

## 2011-10-30 DIAGNOSIS — Z23 Encounter for immunization: Secondary | ICD-10-CM | POA: Diagnosis not present

## 2011-11-21 DIAGNOSIS — H16149 Punctate keratitis, unspecified eye: Secondary | ICD-10-CM | POA: Diagnosis not present

## 2011-11-21 DIAGNOSIS — H10539 Contact blepharoconjunctivitis, unspecified eye: Secondary | ICD-10-CM | POA: Diagnosis not present

## 2011-12-02 DIAGNOSIS — H10539 Contact blepharoconjunctivitis, unspecified eye: Secondary | ICD-10-CM | POA: Diagnosis not present

## 2011-12-30 DIAGNOSIS — H10539 Contact blepharoconjunctivitis, unspecified eye: Secondary | ICD-10-CM | POA: Diagnosis not present

## 2011-12-31 DIAGNOSIS — L259 Unspecified contact dermatitis, unspecified cause: Secondary | ICD-10-CM | POA: Diagnosis not present

## 2012-01-14 DIAGNOSIS — I10 Essential (primary) hypertension: Secondary | ICD-10-CM | POA: Diagnosis not present

## 2012-01-14 DIAGNOSIS — I4891 Unspecified atrial fibrillation: Secondary | ICD-10-CM | POA: Diagnosis not present

## 2012-01-14 DIAGNOSIS — I2510 Atherosclerotic heart disease of native coronary artery without angina pectoris: Secondary | ICD-10-CM | POA: Diagnosis not present

## 2012-01-14 DIAGNOSIS — Z950 Presence of cardiac pacemaker: Secondary | ICD-10-CM | POA: Diagnosis not present

## 2012-01-14 DIAGNOSIS — E7800 Pure hypercholesterolemia, unspecified: Secondary | ICD-10-CM | POA: Diagnosis not present

## 2012-02-20 DIAGNOSIS — F329 Major depressive disorder, single episode, unspecified: Secondary | ICD-10-CM | POA: Diagnosis not present

## 2012-02-20 DIAGNOSIS — E785 Hyperlipidemia, unspecified: Secondary | ICD-10-CM | POA: Diagnosis not present

## 2012-02-20 DIAGNOSIS — R079 Chest pain, unspecified: Secondary | ICD-10-CM | POA: Diagnosis not present

## 2012-03-22 DIAGNOSIS — H1045 Other chronic allergic conjunctivitis: Secondary | ICD-10-CM | POA: Diagnosis not present

## 2012-05-13 ENCOUNTER — Ambulatory Visit
Admission: RE | Admit: 2012-05-13 | Discharge: 2012-05-13 | Disposition: A | Discharge status: Home / Self Care | Payer: Medicare Other | Source: Ambulatory Visit | Attending: Family Medicine | Admitting: Family Medicine

## 2012-05-13 ENCOUNTER — Other Ambulatory Visit: Payer: Self-pay | Admitting: Family Medicine

## 2012-05-13 DIAGNOSIS — M5137 Other intervertebral disc degeneration, lumbosacral region: Secondary | ICD-10-CM | POA: Diagnosis not present

## 2012-05-13 DIAGNOSIS — W19XXXA Unspecified fall, initial encounter: Secondary | ICD-10-CM | POA: Diagnosis not present

## 2012-05-13 DIAGNOSIS — M545 Low back pain: Secondary | ICD-10-CM

## 2012-05-13 DIAGNOSIS — M47817 Spondylosis without myelopathy or radiculopathy, lumbosacral region: Secondary | ICD-10-CM | POA: Diagnosis not present

## 2012-05-13 DIAGNOSIS — I10 Essential (primary) hypertension: Secondary | ICD-10-CM | POA: Diagnosis not present

## 2012-06-02 DIAGNOSIS — F341 Dysthymic disorder: Secondary | ICD-10-CM | POA: Diagnosis not present

## 2012-06-02 DIAGNOSIS — M545 Low back pain: Secondary | ICD-10-CM | POA: Diagnosis not present

## 2012-06-28 DIAGNOSIS — F329 Major depressive disorder, single episode, unspecified: Secondary | ICD-10-CM | POA: Diagnosis not present

## 2012-07-16 DIAGNOSIS — H16149 Punctate keratitis, unspecified eye: Secondary | ICD-10-CM | POA: Diagnosis not present

## 2012-07-16 DIAGNOSIS — H16209 Unspecified keratoconjunctivitis, unspecified eye: Secondary | ICD-10-CM | POA: Diagnosis not present

## 2012-07-27 DIAGNOSIS — H16149 Punctate keratitis, unspecified eye: Secondary | ICD-10-CM | POA: Diagnosis not present

## 2012-07-27 DIAGNOSIS — H16209 Unspecified keratoconjunctivitis, unspecified eye: Secondary | ICD-10-CM | POA: Diagnosis not present

## 2012-08-05 DIAGNOSIS — E1159 Type 2 diabetes mellitus with other circulatory complications: Secondary | ICD-10-CM | POA: Diagnosis not present

## 2012-08-05 DIAGNOSIS — I739 Peripheral vascular disease, unspecified: Secondary | ICD-10-CM | POA: Diagnosis not present

## 2012-08-05 DIAGNOSIS — E1129 Type 2 diabetes mellitus with other diabetic kidney complication: Secondary | ICD-10-CM | POA: Diagnosis not present

## 2012-08-05 DIAGNOSIS — R413 Other amnesia: Secondary | ICD-10-CM | POA: Diagnosis not present

## 2012-08-05 DIAGNOSIS — I129 Hypertensive chronic kidney disease with stage 1 through stage 4 chronic kidney disease, or unspecified chronic kidney disease: Secondary | ICD-10-CM | POA: Diagnosis not present

## 2012-08-05 DIAGNOSIS — F329 Major depressive disorder, single episode, unspecified: Secondary | ICD-10-CM | POA: Diagnosis not present

## 2012-08-05 DIAGNOSIS — E785 Hyperlipidemia, unspecified: Secondary | ICD-10-CM | POA: Diagnosis not present

## 2012-08-16 DIAGNOSIS — H01119 Allergic dermatitis of unspecified eye, unspecified eyelid: Secondary | ICD-10-CM | POA: Diagnosis not present

## 2012-09-02 ENCOUNTER — Encounter: Payer: Self-pay | Admitting: Neurology

## 2012-09-02 ENCOUNTER — Ambulatory Visit (INDEPENDENT_AMBULATORY_CARE_PROVIDER_SITE_OTHER): Payer: Medicare Other | Admitting: Neurology

## 2012-09-02 VITALS — BP 138/67 | HR 71 | Temp 98.3°F | Ht 66.0 in | Wt 137.0 lb

## 2012-09-02 DIAGNOSIS — F0280 Dementia in other diseases classified elsewhere without behavioral disturbance: Secondary | ICD-10-CM | POA: Diagnosis not present

## 2012-09-02 DIAGNOSIS — R413 Other amnesia: Secondary | ICD-10-CM | POA: Diagnosis not present

## 2012-09-02 DIAGNOSIS — G309 Alzheimer's disease, unspecified: Secondary | ICD-10-CM | POA: Diagnosis not present

## 2012-09-02 MED ORDER — DONEPEZIL HCL 5 MG PO TABS: 5.0000 mg | ORAL_TABLET | Freq: Every evening | ORAL | Status: DC | PRN

## 2012-09-02 NOTE — Progress Notes (Signed)
[facility] [address] [place]. [place] [address] [phone]       OFFICE FOLLOW-UP NOTE  [name] Date of Birth:  [birth year] Medical Record Number:  [id number]   HPI:  67 year Caucasian lady with slowly progressive memory loss and cognitive the duration likely senile dementia versus early Alzheimer's. 09/02/2012 She is seen today upon request from her primary physician for reevaluation for progressive memory and cognitive worsening. She was last seen in 2012 that time she had memory difficulties and had scored 27/30 on the Mini-Mental Status exam. Workup for treatable causes of dementia included EEG on 07/08/10 as well as CT head on 07/15/10 which were unremarkable and lab work on 07/05/10 including vitamin B12, TSH RPR and homocystine were all normal. She had been thought to have mild cognitive impairment but daughter feels the last year or so there has been steady progressive decline. She is currently living [place] in her independent apartment in a senior citizen place but has been noted to being more forgetful and not remembering things. The daughter gets quite frustrated as she has to tell her the same information multiple times and patient still forgets. She however is able to attend to most of activities of daily living herself and keeps her apartment quite tightly and there have been no concerns about safety issues. She can walk fairly well and is no fall risk her other concerns. She does not have any hallucinations, delusions for agitation problems. The patient and daughter are now willing to try medications like Aricept.  ROS:   14 system review of systems is positive for memory loss, weight loss, fatigue, rash, hearing loss, spinning sensation, shortness of breath, incontinence, feeling cold, increased thirst, easy bruising, confusion, weakness, dizziness, anxiety, depression, decreased energy and appetite and disinterest in activities.  PMH:  Past Medical History   Diagnosis Date  . Arteriosclerotic cardiovascular disease (ASCVD)   . DM type 2 (diabetes mellitus, type 2)   . HTN (hypertension)   . Chronic anxiety     and depression  . Gallstones     asysmptomatic  . Sick sinus syndrome     s/p PPM  . Hyperlipidemia     Social History:  History   Social History  . Marital Status: Widowed    Spouse Name: N/A    Number of Children: 3  . Years of Education: N/A   Occupational History  . Not on file.   Social History Main Topics  . Smoking status: Former Smoker -- 1.00 packs/day for 25 years  . Smokeless tobacco: Former Neurosurgeon     Comment: quit in 2001  . Alcohol Use: No  . Drug Use: No  . Sexually Active: Not on file   Other Topics Concern  . Not on file   Social History Narrative   Patient lives at [facility].   Caffeine Use: none   widowed    Medications:   Current Outpatient Prescriptions on File Prior to Visit  Medication Sig Dispense Refill  . aspirin 325 MG tablet Take 325 mg by mouth daily.      . DULoxetine (CYMBALTA) 60 MG capsule Take 60 mg by mouth 2 (two) times daily.       Marland Kitchen LORazepam (ATIVAN) 0.5 MG tablet Take by mouth. Take one half to one tablet by mouth 3 times a day as needed      . metFORMIN (GLUCOPHAGE-XR) 500 MG 24 hr tablet Take 500 mg by mouth daily  with breakfast.        . olmesartan (BENICAR) 20 MG tablet Take 20 mg by mouth daily.        . rosuvastatin (CRESTOR) 40 MG tablet Take 20 mg by mouth daily.       . traZODone (DESYREL) 100 MG tablet Take 100 mg by mouth at bedtime.         No current facility-administered medications on file prior to visit.    Allergies:   Allergies  Allergen Reactions  . Clarithromycin   . Codeine Phosphate   . Effexor (Venlafaxine Hydrochloride)   . Fexofenadine Hcl   . Lisinopril   . Prozac (Fluoxetine Hcl)   . Septra (Bactrim)   . Wellbutrin (Bupropion Hcl)     Physical Exam General: well developed, well nourished, seated, in no evident  distress Head: head normocephalic and atraumatic. Orohparynx benign Neck: supple with no carotid or supraclavicular bruits Cardiovascular: regular rate and rhythm, no murmurs Musculoskeletal: no deformity Skin:  no rash/petichiae Vascular:  Normal pulses all extremities Filed Vitals:   09/02/12 1322  BP: 138/67  Pulse: 71  Temp: 98.3 F (36.8 C)    Neurologic Exam Mental Status: Awake and fully alert. Oriented to place and time. Recent and remote memory intact. Attention span, concentration and fund of knowledge appropriate. Mood and affect appropriate. Mini-Mental status exam scored 26/30 with deficits only in recall and drawing. Animal fluency tests scored 9 and Clock  drawing scored 3/4. Geriatric depression scale scored 6 suggestive of mild depression only. Cranial Nerves: Fundoscopic exam reveals sharp disc margins. Pupils equal, briskly reactive to light. Extraocular movements full without nystagmus. Visual fields full to confrontation. Hearing intact. Facial sensation intact. Face, tongue, palate moves normally and symmetrically.  Motor: Normal bulk and tone. Normal strength in all tested extremity muscles. Sensory.: intact to tough and pinprick and vibratory.  Coordination: Rapid alternating movements normal in all extremities. Finger-to-nose and heel-to-shin performed accurately bilaterally. Gait and Station: Arises from chair without difficulty. Stance is normal. Gait demonstrates mild initiation apraxia. Reverse left knee due to pain. Not able to heel, toe and tandem walk without difficulty.  Reflexes: 2+ and symmetric. Toes downgoing.     ASSESSMENT: 61 year Caucasian lady with slowly progressive memory loss and cognitive the duration likely senile dementia versus early Alzheimer's.    PLAN: I had a long discussion with the patient and daughter regarding her progressive memory difficulties as well as discuss treatment options and medication side effects and answered  questions. Trial of Aricept 5 mg daily into one month to be increased if tolerated without side effects to 10 mg daily. Return for followup in 2 months with Heide Guile, NP or call earlier if necessary.

## 2012-09-02 NOTE — Patient Instructions (Signed)
Trial of Aricept 5 mg daily into one month to be increased if tolerated without side effects to 10 mg daily. Return for followup in 2 months with Heide Guile, NP or call earlier if necessary.

## 2012-09-23 ENCOUNTER — Telehealth: Payer: Self-pay | Admitting: Neurology

## 2012-09-24 ENCOUNTER — Other Ambulatory Visit: Payer: Self-pay | Admitting: *Deleted

## 2012-09-24 NOTE — Telephone Encounter (Signed)
I called pt and went over her medication instructions.   She is to take donepezil 5mg  po at bedtime daily for 1 month then take 5mg  po po bid.  May take with food or without. May cause insomnia (then switch to taking in daytime).  Pt verbalized understanding.

## 2012-09-29 ENCOUNTER — Other Ambulatory Visit: Payer: Self-pay

## 2012-09-29 DIAGNOSIS — F0280 Dementia in other diseases classified elsewhere without behavioral disturbance: Secondary | ICD-10-CM

## 2012-09-29 MED ORDER — DONEPEZIL HCL 10 MG PO TABS: 10.0000 mg | ORAL_TABLET | Freq: Every day | ORAL | Status: DC

## 2012-10-01 ENCOUNTER — Telehealth: Payer: Self-pay | Admitting: Neurology

## 2012-10-01 ENCOUNTER — Other Ambulatory Visit: Payer: Self-pay

## 2012-10-01 DIAGNOSIS — F0280 Dementia in other diseases classified elsewhere without behavioral disturbance: Secondary | ICD-10-CM

## 2012-10-01 MED ORDER — DONEPEZIL HCL 10 MG PO TABS: 10.0000 mg | ORAL_TABLET | Freq: Every day | ORAL | Status: DC

## 2012-10-01 NOTE — Telephone Encounter (Signed)
Rx has already been sent:  donepezil (ARICEPT) 10 MG tablet 90 tablet 1 10/01/2012     Sig - Route: Take 1 tablet (10 mg total) by mouth at bedtime. After finishing 5mg  once daily dose for one month - Oral    E-Prescribing Status: Receipt confirmed by pharmacy (10/01/2012 11:16 AM EDT)             Associated Diagnoses    Alzheimer's disease - Primary          Pharmacy    [id number] - [place], [place] - [address]

## 2012-10-08 DIAGNOSIS — Z23 Encounter for immunization: Secondary | ICD-10-CM | POA: Diagnosis not present

## 2012-11-05 DIAGNOSIS — R030 Elevated blood-pressure reading, without diagnosis of hypertension: Secondary | ICD-10-CM | POA: Diagnosis not present

## 2012-11-05 DIAGNOSIS — M2550 Pain in unspecified joint: Secondary | ICD-10-CM | POA: Diagnosis not present

## 2012-11-05 DIAGNOSIS — R109 Unspecified abdominal pain: Secondary | ICD-10-CM | POA: Diagnosis not present

## 2012-11-05 DIAGNOSIS — E1129 Type 2 diabetes mellitus with other diabetic kidney complication: Secondary | ICD-10-CM | POA: Diagnosis not present

## 2012-11-05 DIAGNOSIS — F329 Major depressive disorder, single episode, unspecified: Secondary | ICD-10-CM | POA: Diagnosis not present

## 2012-11-09 ENCOUNTER — Ambulatory Visit (INDEPENDENT_AMBULATORY_CARE_PROVIDER_SITE_OTHER): Payer: Medicare Other | Admitting: Neurology

## 2012-11-09 ENCOUNTER — Encounter: Payer: Self-pay | Admitting: Neurology

## 2012-11-09 VITALS — BP 148/67 | HR 70 | Temp 98.5°F | Ht 67.0 in | Wt 134.0 lb

## 2012-11-09 DIAGNOSIS — L259 Unspecified contact dermatitis, unspecified cause: Secondary | ICD-10-CM | POA: Diagnosis not present

## 2012-11-09 DIAGNOSIS — F0280 Dementia in other diseases classified elsewhere without behavioral disturbance: Secondary | ICD-10-CM

## 2012-11-09 MED ORDER — MEMANTINE HCL 10 MG PO TABS: 10.0000 mg | ORAL_TABLET | Freq: Two times a day (BID) | ORAL | Status: DC

## 2012-11-09 NOTE — Progress Notes (Signed)
[facility] [address] [place] [place] [address] [phone]       OFFICE FOLLOW-UP NOTE  [name] Date of Birth:  [birth year] Medical Record Number:  [id number]   HPI:  7 year Caucasian lady with slowly progressive memory loss and cognitive the duration likely senile dementia versus early Alzheimer's. 09/02/2012 She is seen today upon request from her primary physician for reevaluation for progressive memory and cognitive worsening. She was last seen in 2012 that time she had memory difficulties and had scored 27/30 on the Mini-Mental Status exam. Workup for treatable causes of dementia included EEG on 07/08/10 as well as CT head on 07/15/10 which were unremarkable and lab work on 07/05/10 including vitamin B12, TSH RPR and homocystine were all normal. She had been thought to have mild cognitive impairment but daughter feels the last year or so there has been steady progressive decline. She is currently living K. in her independent apartment in a senior citizen place but has been noted to being more forgetful and not remembering things. The daughter gets quite frustrated as she has to tell her the same information multiple times and patient still forgets. She however is able to attend to most of activities of daily living herself and keeps her apartment quite tightly and there have been no concerns about safety issues. She can walk fairly well and is no fall risk her other concerns. She does not have any hallucinations, delusions for agitation problems. The patient and daughter are now willing to try medications like Aricept. Update 11/09/12 she returns for followup after last visit on 09/02/12. She is accompanied by her daughter who provides most of the history. They feel that she is tolerating Aricept well without significant nausea vomiting diarrhea or dizziness but they have not noticed significant improvement. She continues to have intermittent confusion and memory  difficulties. She has lost appetite and about 10 pounds of weight. She denies significant agitation, behavioral disturbance, delusions or hallucinations. She has not had any falls and can walk with good balance. ROS:   14 system review of systems is positive for of weight loss, fatigue, palpitations, spinning sensation, itching, rash, shortness of breath, easy bruising, feeling cold, joint pain, aching muscles, runny nose, skin sensitivity, memory loss, confusion, headache, weakness, dizziness, depression, anxiety, decreased energy, change in appetite and disinterest in activities. PMH:  Past Medical History  Diagnosis Date  . Arteriosclerotic cardiovascular disease (ASCVD)   . DM type 2 (diabetes mellitus, type 2)   . HTN (hypertension)   . Chronic anxiety     and depression  . Gallstones     asysmptomatic  . Sick sinus syndrome     s/p PPM  . Hyperlipidemia     Social History:  History   Social History  . Marital Status: Widowed    Spouse Name: N/A    Number of Children: 3  . Years of Education: N/A   Occupational History  . Not on file.   Social History Main Topics  . Smoking status: Former Smoker -- 1.00 packs/day for 25 years  . Smokeless tobacco: Former Neurosurgeon     Comment: quit in 2001  . Alcohol Use: No  . Drug Use: No  . Sexual Activity: No   Other Topics Concern  . Not on file   Social History Narrative   Patient lives at [facility] community center.   Caffeine Use: none   widowed    Medications:   Current Outpatient Prescriptions on  File Prior to Visit  Medication Sig Dispense Refill  . aspirin 325 MG tablet Take 325 mg by mouth daily.      Marland Kitchen donepezil (ARICEPT) 10 MG tablet Take 1 tablet (10 mg total) by mouth at bedtime. After finishing 5mg  once daily dose for one month  90 tablet  1  . DULoxetine (CYMBALTA) 60 MG capsule Take 60 mg by mouth 2 (two) times daily.       Marland Kitchen LORazepam (ATIVAN) 0.5 MG tablet Take by mouth. Take one half to one tablet by mouth  3 times a day as needed      . metFORMIN (GLUCOPHAGE-XR) 500 MG 24 hr tablet Take 500 mg by mouth daily with breakfast.        . olmesartan (BENICAR) 20 MG tablet Take 20 mg by mouth daily.        . rosuvastatin (CRESTOR) 40 MG tablet Take 20 mg by mouth daily.       . traZODone (DESYREL) 100 MG tablet Take 100 mg by mouth at bedtime.         No current facility-administered medications on file prior to visit.    Allergies:   Allergies  Allergen Reactions  . Clarithromycin   . Codeine Phosphate   . Effexor [Venlafaxine Hydrochloride]   . Fexofenadine Hcl   . Lisinopril   . Prozac [Fluoxetine Hcl]   . Septra [Bactrim]   . Wellbutrin [Bupropion Hcl]     Physical Exam General: well developed, well nourished, seated, in no evident distress Head: head normocephalic and atraumatic. Orohparynx benign Neck: supple with no carotid or supraclavicular bruits Cardiovascular: regular rate and rhythm, no murmurs Musculoskeletal: no deformity Skin:  no rash/petichiae Vascular:  Normal pulses all extremities Filed Vitals:   11/09/12 1444  BP: 148/67  Pulse: 70  Temp: 98.5 F (36.9 C)    Neurologic Exam Mental Status: Awake and fully alert. Oriented to place and time. Recent and remote memory intact. Attention span, concentration and fund of knowledge appropriate. Mood and affect appropriate. Mini-Mental status exam scored 23/30 with deficits only in orientation, recall and drawing. Animal fluency tests scored 8 and Clock  drawing scored 4/4. Geriatric depression scale scored 6 suggestive of mild depression only. Katz index scored 6 which is independent. I ADL score 7. NPI-q. was positive for delusions, agitation, anxiety, up with he, disinhibition and irritability Cranial Nerves: Fundoscopic exam reveals sharp disc margins. Pupils equal, briskly reactive to light. Extraocular movements full without nystagmus. Visual fields full to confrontation. Hearing intact. Facial sensation intact. Face,  tongue, palate moves normally and symmetrically.  Motor: Normal bulk and tone. Normal strength in all tested extremity muscles. Sensory.: intact to tough and pinprick and vibratory.  Coordination: Rapid alternating movements normal in all extremities. Finger-to-nose and heel-to-shin performed accurately bilaterally. Gait and Station: Arises from chair without difficulty. Stance is normal. Gait demonstrates mild initiation apraxia. Reverse left knee due to pain. Not able to heel, toe and tandem walk without difficulty.  Reflexes: 2+ and symmetric. Toes downgoing.     ASSESSMENT: 47 year Caucasian lady with slowly progressive memory loss and cognitive the duration likely senile dementia versus early Alzheimer's.    PLAN: I had a long discussion with the patient and daughter regarding her progressive memory difficulties as well as discuss treatment options and medication side effects and answered questions. Continue Aricept 10 mg daily and will not increase the dose due to her weight loss. I gave the patient start her back for [facility]  and increase slowly as tolerated to 10 mg twice daily. I also gave her prescription of Namenda to be filled if she can tolerate the starter pack. Return for followup in 2 months with Larita Fife, NP

## 2012-11-09 NOTE — Patient Instructions (Signed)
Continue Aricept 10 mg daily and will not increase the dose due to her weight loss. I gave the patient start her back for Namenda and increase slowly as tolerated to 10 mg twice daily. I also gave her prescription of Namenda to be filled if she can tolerate the starter pack. Return for followup in 2 months with Larita Fife, NP

## 2012-11-30 DIAGNOSIS — H01119 Allergic dermatitis of unspecified eye, unspecified eyelid: Secondary | ICD-10-CM | POA: Diagnosis not present

## 2012-12-02 ENCOUNTER — Other Ambulatory Visit: Payer: Self-pay

## 2012-12-14 DIAGNOSIS — H9210 Otorrhea, unspecified ear: Secondary | ICD-10-CM | POA: Diagnosis not present

## 2012-12-14 DIAGNOSIS — H6980 Other specified disorders of Eustachian tube, unspecified ear: Secondary | ICD-10-CM | POA: Diagnosis not present

## 2012-12-21 ENCOUNTER — Telehealth: Payer: Self-pay | Admitting: Interventional Cardiology

## 2012-12-21 NOTE — Telephone Encounter (Signed)
New message     Pt has a pacemaker---c/o pain in pacemaker area--shoulder and arm for about 2wks.

## 2012-12-21 NOTE — Telephone Encounter (Signed)
Spoke with Melissa and she will call pt and work pt into Dr. Jenel Lucks schedule tomorrow. Pt aware.

## 2012-12-22 ENCOUNTER — Encounter: Payer: Medicare Other | Admitting: Internal Medicine

## 2013-01-11 ENCOUNTER — Encounter: Payer: Self-pay | Admitting: Nurse Practitioner

## 2013-01-11 ENCOUNTER — Ambulatory Visit (INDEPENDENT_AMBULATORY_CARE_PROVIDER_SITE_OTHER): Payer: Medicare Other | Admitting: *Deleted

## 2013-01-11 ENCOUNTER — Ambulatory Visit (INDEPENDENT_AMBULATORY_CARE_PROVIDER_SITE_OTHER): Payer: Medicare Other | Admitting: Nurse Practitioner

## 2013-01-11 ENCOUNTER — Encounter: Payer: Self-pay | Admitting: Interventional Cardiology

## 2013-01-11 ENCOUNTER — Ambulatory Visit (INDEPENDENT_AMBULATORY_CARE_PROVIDER_SITE_OTHER): Payer: Medicare Other | Admitting: Interventional Cardiology

## 2013-01-11 ENCOUNTER — Encounter: Payer: Self-pay | Admitting: Internal Medicine

## 2013-01-11 VITALS — BP 157/69 | HR 77 | Ht 66.25 in | Wt 133.0 lb

## 2013-01-11 VITALS — BP 142/78 | HR 75 | Ht 67.0 in | Wt 134.0 lb

## 2013-01-11 DIAGNOSIS — E782 Mixed hyperlipidemia: Secondary | ICD-10-CM | POA: Diagnosis not present

## 2013-01-11 DIAGNOSIS — R001 Bradycardia, unspecified: Secondary | ICD-10-CM

## 2013-01-11 DIAGNOSIS — I498 Other specified cardiac arrhythmias: Secondary | ICD-10-CM

## 2013-01-11 DIAGNOSIS — I2510 Atherosclerotic heart disease of native coronary artery without angina pectoris: Secondary | ICD-10-CM | POA: Diagnosis not present

## 2013-01-11 DIAGNOSIS — F0280 Dementia in other diseases classified elsewhere without behavioral disturbance: Secondary | ICD-10-CM

## 2013-01-11 DIAGNOSIS — I10 Essential (primary) hypertension: Secondary | ICD-10-CM | POA: Diagnosis not present

## 2013-01-11 LAB — MDC_IDC_ENUM_SESS_TYPE_INCLINIC
Brady Statistic RA Percent Paced: 78 %
Brady Statistic RV Percent Paced: 3 %
Implantable Pulse Generator Serial Number: 120275
Lead Channel Impedance Value: 345 Ohm
Lead Channel Pacing Threshold Amplitude: 0.6 V
Lead Channel Pacing Threshold Pulse Width: 0.4 ms
Lead Channel Sensing Intrinsic Amplitude: 1.3 mV
Zone Setting Detection Interval: 375 ms

## 2013-01-11 MED ORDER — DONEPEZIL HCL 10 MG PO TABS: 10.0000 mg | ORAL_TABLET | Freq: Every day | ORAL | Status: DC

## 2013-01-11 MED ORDER — MEMANTINE HCL 10 MG PO TABS: 10.0000 mg | ORAL_TABLET | Freq: Two times a day (BID) | ORAL | Status: DC

## 2013-01-11 NOTE — Progress Notes (Signed)
PATIENT: Valerie Carr DOB: 01-11-1930   REASON FOR VISIT: follow up for dementia HISTORY FROM: patient  HISTORY OF PRESENT ILLNESS: HPI: 28 year Caucasian lady with slowly progressive memory loss and cognitive the duration likely senile dementia versus early Alzheimer's.  09/02/2012 She is seen today upon request from her primary physician for reevaluation for progressive memory and cognitive worsening. She was last seen in 2012 that time she had memory difficulties and had scored 27/30 on the Mini-Mental Status exam. Workup for treatable causes of dementia included EEG on 07/08/10 as well as CT head on 07/15/10 which were unremarkable and lab work on 07/05/10 including vitamin B12, TSH RPR and homocystine were all normal. She had been thought to have mild cognitive impairment but daughter feels the last year or so there has been steady progressive decline. She is currently living [place]. in her independent apartment in a senior citizen place but has been noted to being more forgetful and not remembering things. The daughter gets quite frustrated as she has to tell her the same information multiple times and patient still forgets. She however is able to attend to most of activities of daily living herself and keeps her apartment quite tightly and there have been no concerns about safety issues. She can walk fairly well and is no fall risk her other concerns. She does not have any hallucinations, delusions for agitation problems. The patient and daughter are now willing to try medications like Aricept.   11/09/12 she returns for followup after last visit on 09/02/12. She is accompanied by her daughter who provides most of the history. They feel that she is tolerating Aricept well without significant nausea vomiting diarrhea or dizziness but they have not noticed significant improvement. She continues to have intermittent confusion and memory difficulties. She has lost appetite and about 10 pounds of weight.  She denies significant agitation, behavioral disturbance, delusions or hallucinations. She has not had any falls and can walk with good balance.   UPDATE 01/11/13 (LL):  Valerie Carr returns for followup after last visit on 11/09/2012. She is again accompanied by her daughter. She reports that she is tolerating Aricept and Namenda well without any known side effects, the patient states that she does not see any improvement. Her weight has been stable losing only 1 pound since last visit. She denies falls. She still independent with all of her activities of daily living, she is still driving short distances in living in her own apartment. Daughter states that she needs help balancing her checkbook. They deny any behavioral problems. They feel like her memory is stable since last visit.  ROS:  14 system review of systems is positive for of weight loss, fatigue, palpitations, spinning sensation, itching, rash, shortness of breath, easy bruising, feeling cold, joint pain, aching muscles,  Walking difficulty, coordination problem, eye redness, light sensitivity, eye pain, runny nose, skin sensitivity, memory loss, dizziness, depression, decreased concentration, decreased energy,   ALLERGIES: Allergies  Allergen Reactions  . Clarithromycin   . Codeine Phosphate   . Effexor [Venlafaxine Hydrochloride]   . Fexofenadine Hcl   . Lisinopril   . Prozac [Fluoxetine Hcl]   . Septra [Bactrim]   . Wellbutrin [Bupropion Hcl]     HOME MEDICATIONS: Outpatient Prescriptions Prior to Visit  Medication Sig Dispense Refill  . aspirin 325 MG tablet Take 325 mg by mouth daily.      . DULoxetine (CYMBALTA) 60 MG capsule Take 60 mg by mouth 2 (two) times daily.       Marland Kitchen  LORazepam (ATIVAN) 0.5 MG tablet Take by mouth. Take one half to one tablet by mouth 3 times a day as needed      . metFORMIN (GLUCOPHAGE-XR) 500 MG 24 hr tablet Take 500 mg by mouth daily with breakfast.        . olmesartan (BENICAR) 20 MG tablet Take 20  mg by mouth daily.        . rosuvastatin (CRESTOR) 40 MG tablet Take 20 mg by mouth daily.       . traMADol (ULTRAM) 50 MG tablet       . traZODone (DESYREL) 100 MG tablet Take 100 mg by mouth at bedtime.        . donepezil (ARICEPT) 10 MG tablet Take 1 tablet (10 mg total) by mouth at bedtime. After finishing 5mg  once daily dose for one month  90 tablet  1  . memantine (NAMENDA) 10 MG tablet Take 1 tablet (10 mg total) by mouth 2 (two) times daily.  60 tablet  3   No facility-administered medications prior to visit.    PAST MEDICAL HISTORY: Past Medical History  Diagnosis Date  . Arteriosclerotic cardiovascular disease (ASCVD)   . DM type 2 (diabetes mellitus, type 2)   . HTN (hypertension)   . Chronic anxiety     and depression  . Gallstones     asysmptomatic  . Sick sinus syndrome     s/p PPM  . Hyperlipidemia     PAST SURGICAL HISTORY: Past Surgical History  Procedure Laterality Date  . Coronary artery bypass graft      07-11-2005, triple bypass  . Pacemaker insertion  2004    [employer] PPM implanted by Dr Amil Amen, generator change by Dr Johney Frame 01/07/11  . Total abdominal hysterectomy w/ bilateral salpingoophorectomy    . Appendectomy    . Tonsillectomy    . Esophagogastroduodenoscopy      FAMILY HISTORY: Family History  Problem Relation Age of Onset  . Coronary artery disease Father   . Diabetes Mother   . Diabetes Brother   . Throat cancer Brother   . Diabetes Brother   . Hypertension Sister   . Cancer Son     esophageal    SOCIAL HISTORY: History   Social History  . Marital Status: Widowed    Spouse Name: N/A    Number of Children: 3  . Years of Education: 9   Occupational History  . retired    Social History Main Topics  . Smoking status: Former Smoker -- 1.00 packs/day for 25 years  . Smokeless tobacco: Former Neurosurgeon     Comment: quit in 2001  . Alcohol Use: No  . Drug Use: No  . Sexual Activity: No   Other Topics Concern  . Not on  file   Social History Narrative   Patient lives at [facility] community [facility].   Caffeine Use: none   widowed     PHYSICAL EXAM  Filed Vitals:   01/11/13 1435  BP: 157/69  Pulse: 77  Height: 5' 6.25" (1.683 m)  Weight: 133 lb (60.328 kg)   Body mass index is 21.3 kg/(m^2).  General: well developed, well nourished, seated, in no evident distress  Head: head normocephalic and atraumatic. Orohparynx benign  Neck: supple with no carotid or supraclavicular bruits  Cardiovascular: regular rate and rhythm, no murmurs  Musculoskeletal: mild kyphosis   Skin: no rash/petichiae  Vascular: Normal pulses all extremities   Neurologic Exam  Mental Status: Awake and fully  alert. Oriented to place and time. Recent and remote memory intact. Attention span, concentration and fund of knowledge appropriate. Mood and affect appropriate.  MMSE not completed today. (last visit 11/09/2012: Mini-Mental status exam scored 23/30 with deficits only in orientation, recall and drawing. Animal fluency tests scored 8 and Clock drawing scored 4/4. Geriatric depression scale scored 6 suggestive of mild depression only. Katz index scored 6 which is independent. I ADL score 7. NPI-q. was positive for anxiety, disinhibition and irritability).  Cranial Nerves: Pupils equal, briskly reactive to light. Extraocular movements full without nystagmus. Visual fields full to confrontation. Hearing intact. Facial sensation intact. Face, tongue, palate moves normally and symmetrically.  Motor: Normal bulk and tone. Normal strength in all tested extremity muscles.  Sensory: intact to touch and pinprick and vibratory.  Coordination: Rapid alternating movements normal in all extremities. Finger-to-nose and heel-to-shin performed accurately bilaterally.  Gait and Station: Arises from chair without difficulty. Stance is normal. Gait demonstrates mild initiation apraxia.  Not able to heel, toe and tandem walk without difficulty.    Reflexes: 2+ and symmetric. Toes downgoing.   DIAGNOSTIC DATA (LABS, IMAGING, TESTING) - I reviewed patient records, labs, notes, testing and imaging myself where available.   ASSESSMENT AND PLAN ASSESSMENT:  24 year Caucasian lady with slowly progressive memory loss and cognitive the duration likely senile dementia versus early Alzheimer's. there is a positive family history for Alzheimer's disease.   PLAN:  I had a long discussion with the patient and daughter regarding her progressive memory difficulties as well as discuss treatment options and medication side effects and answered questions.   Continue Aricept 10 mg daily and will not increase the dose due to her weight loss. Continue Namenda 10 mg twice daily. Return for followup in 6 months with Valerie Fife, NP   No orders of the defined types were placed in this encounter.    Meds ordered this encounter  Medications  . memantine (NAMENDA) 10 MG tablet    Sig: Take 1 tablet (10 mg total) by mouth 2 (two) times daily.    Dispense:  60 tablet    Refill:  5    Order Specific Question:  Supervising Provider    Answer:  Pearlean Brownie, PRAMOD S [[id number]]  . donepezil (ARICEPT) 10 MG tablet    Sig: Take 1 tablet (10 mg total) by mouth at bedtime. After finishing 5mg  once daily dose for one month    Dispense:  90 tablet    Refill:  3    Order Specific Question:  Supervising Provider    Answer:  Micki Riley [[id number]]   Return in about 6 months (around 07/12/2013).  Valerie Fear, MSN, NP-C 01/11/2013, 5:15 PM [facility] [address], [address] [place], [place] [address] [phone]  Note: This document was prepared with digital dictation and possible smart phrase technology. Any transcriptional errors that result from this process are unintentional.

## 2013-01-11 NOTE — Progress Notes (Signed)
Patient ID: Valerie Carr, female   DOB: 1929-11-21, 79 y.o.   MRN: [id number]    [address] [place], [place]  [address] Phone: [phone] Fax:  [phone]  Date:  01/11/2013   ID:  IAN CAVEY, DOB 1929-03-29, MRN [id number]  PCP:  Cala Bradford, MD      History of Present Illness: Valerie Carr is a 79 y.o. female   with a pacer and CAD. SHe had a recent generator changeout. She has fatigue and knee pain. She walks about once a week. She feels like she has no energy. No difference since the pacer change. She has some pain in the upper abdomen where her bra is. No chest pain with walking. She feels pain around her pacemaker site. She had an appointment for pacemaker check several weeks ago but did not make that appointment. Her daughter reports that her Alzheimer's is getting more significant. The patient is spending a lot of time moving furniture at home, about every 2 weeks. She puts a pillow under her abdomen and then pushes the furniture with a pillow. After that, she does have this upper abdominal soreness. Overall, the patient doesn't mind moving furniture. She just wants to rearrange things at times.  This is clearly a source of friction between patient and her daughter.    Wt Readings from Last 3 Encounters:  01/11/13 134 lb (60.782 kg)  11/09/12 134 lb (60.782 kg)  09/02/12 137 lb (62.143 kg)     Past Medical History  Diagnosis Date  . Arteriosclerotic cardiovascular disease (ASCVD)   . DM type 2 (diabetes mellitus, type 2)   . HTN (hypertension)   . Chronic anxiety     and depression  . Gallstones     asysmptomatic  . Sick sinus syndrome     s/p PPM  . Hyperlipidemia     Current Outpatient Prescriptions  Medication Sig Dispense Refill  . aspirin 325 MG tablet Take 325 mg by mouth daily.      Marland Kitchen donepezil (ARICEPT) 10 MG tablet Take 1 tablet (10 mg total) by mouth at bedtime. After finishing 5mg  once daily dose for one month  90 tablet   1  . DULoxetine (CYMBALTA) 60 MG capsule Take 60 mg by mouth 2 (two) times daily.       Marland Kitchen LORazepam (ATIVAN) 0.5 MG tablet Take by mouth. Take one half to one tablet by mouth 3 times a day as needed      . memantine (NAMENDA) 10 MG tablet Take 1 tablet (10 mg total) by mouth 2 (two) times daily.  60 tablet  3  . metFORMIN (GLUCOPHAGE-XR) 500 MG 24 hr tablet Take 500 mg by mouth daily with breakfast.        . olmesartan (BENICAR) 20 MG tablet Take 20 mg by mouth daily.        . rosuvastatin (CRESTOR) 40 MG tablet Take 20 mg by mouth daily.       . traMADol (ULTRAM) 50 MG tablet       . traZODone (DESYREL) 100 MG tablet Take 100 mg by mouth at bedtime.         No current facility-administered medications for this visit.    Allergies:    Allergies  Allergen Reactions  . Clarithromycin   . Codeine Phosphate   . Effexor [Venlafaxine Hydrochloride]   . Fexofenadine Hcl   . Lisinopril   . Prozac [Fluoxetine Hcl]   . Septra [Bactrim]   .  Wellbutrin [Bupropion Hcl]     Social History:  The patient  reports that she has quit smoking. She has quit using smokeless tobacco. She reports that she does not drink alcohol or use illicit drugs.   Family History:  The patient's family history includes Cancer in her son; Coronary artery disease in her father; Diabetes in her brother, brother, and mother; Hypertension in her sister; Throat cancer in her brother.   ROS:  Please see the history of present illness.  No nausea, vomiting.  No fevers, chills.  No focal weakness.  No dysuria.    All other systems reviewed and negative.   PHYSICAL EXAM: VS:  BP 142/78  Pulse 75  Ht 5\' 7"  (1.702 m)  Wt 134 lb (60.782 kg)  BMI 20.98 kg/m2 Well nourished, well developed, in no acute distress HEENT: normal Neck: no JVD, no carotid bruits Cardiac:  normal S1, S2; RRR; 1/6 systolic murmur Lungs:  clear to auscultation bilaterally, no wheezing, rhonchi or rales Abd: soft, nontender, no hepatomegaly Ext: no  edema Skin: warm and dry Neuro:   no focal abnormalities noted  EKG:  NSR, no ST segment changes     ASSESSMENT AND PLAN:  Coronary atherosclerosis of native coronary artery  No angina. Atypical chestpain.  Brought on only with palpation. Unchanged from last year. No symptoms like what she had before CABG.  2. Atrial fibrillation  Continue Aspirin Tablet, 325 MG, 1 tablet, Orally, Once a day Diagnostic Imaging:EKG Harward,Amy 01/14/2012 11:14:53 AM > Jadeyn Hargett,JAY 01/14/2012 11:34:59 AM > atrial pacing, no ST segment changes  s/p MAze. No palpitations. Not a good candidate for anticoagulation given her Alzheimer's disease.  3. Cardiac pacemaker in situ  COntinue routine checks. Doing well from [employer].  4. Hypercholesteremia, pure  Continue Crestor Tablet, 40 MG, 1/2 tablet, Orally, Once a day Well controlled in 12/12. LDL 63 in July 2014.  5. Hypertension, essential  Check BP at home. If readings consistently above 150/90, would have to adjust medications.    Signed, Fredric Mare, MD, [facility] 01/11/2013 12:21 PM

## 2013-01-11 NOTE — Patient Instructions (Addendum)
Your physician wants you to follow-up in: 1 year with Dr. Eldridge Dace. You will receive a reminder letter in the mail two months in advance. If you don't receive a letter, please call our office to schedule the follow-up appointment.  You are overdue for pacer check and will be set up today for the first available appointment.

## 2013-01-11 NOTE — Progress Notes (Signed)
Pacemaker check in clinic. Normal device function. Thresholds, sensing, impedances consistent with previous measurements. Device programmed to maximize longevity. 2 mode switches--both less than 1 minute. No high ventricular rates noted. Device programmed at appropriate safety margins. Histogram distribution appropriate for patient activity level. Device programmed to optimize intrinsic conduction. Estimated longevity 7.5 years. ROV in 3 mths w/JA.

## 2013-01-17 ENCOUNTER — Other Ambulatory Visit: Payer: Self-pay | Admitting: Neurology

## 2013-01-17 NOTE — Telephone Encounter (Signed)
Prescribing Provider Encounter Provider   Ronal Fear, NP Ronal Fear, NP      Supervision Information    Supervising Provider Type of Supervision   Micki Riley, MD Incident To         Medication Detail      Disp Refills Start End     memantine (NAMENDA) 10 MG tablet 60 tablet 5 01/11/2013     Sig - Route: Take 1 tablet (10 mg total) by mouth 2 (two) times daily. - Oral    E-Prescribing Status: Receipt confirmed by pharmacy (01/11/2013 3:50 PM EST)             Associated Diagnoses    Alzheimer's disease - Primary          Pharmacy    [facility] [place] - [place] Otto, [place] - [address]

## 2013-01-21 ENCOUNTER — Telehealth: Payer: Self-pay

## 2013-01-21 NOTE — Telephone Encounter (Signed)
The pharmacy faxed [place] stating a prior auth was needed for Namenda.  I contacted [employer] and they responded by saying No prior auth is required because this medication is covered under the benefit plan.  They state the pharmacy may call the helpdesk at [phone] if they are having issues processing the Rx.  Ref Member ID # [id number].

## 2013-03-03 DIAGNOSIS — E1129 Type 2 diabetes mellitus with other diabetic kidney complication: Secondary | ICD-10-CM | POA: Diagnosis not present

## 2013-03-03 DIAGNOSIS — I739 Peripheral vascular disease, unspecified: Secondary | ICD-10-CM | POA: Diagnosis not present

## 2013-03-03 DIAGNOSIS — N183 Chronic kidney disease, stage 3 unspecified: Secondary | ICD-10-CM | POA: Diagnosis not present

## 2013-03-03 DIAGNOSIS — Z23 Encounter for immunization: Secondary | ICD-10-CM | POA: Diagnosis not present

## 2013-03-03 DIAGNOSIS — E785 Hyperlipidemia, unspecified: Secondary | ICD-10-CM | POA: Diagnosis not present

## 2013-03-03 DIAGNOSIS — E1159 Type 2 diabetes mellitus with other circulatory complications: Secondary | ICD-10-CM | POA: Diagnosis not present

## 2013-03-03 DIAGNOSIS — F0390 Unspecified dementia without behavioral disturbance: Secondary | ICD-10-CM | POA: Diagnosis not present

## 2013-04-18 ENCOUNTER — Telehealth: Payer: Self-pay | Admitting: Internal Medicine

## 2013-04-18 ENCOUNTER — Ambulatory Visit (INDEPENDENT_AMBULATORY_CARE_PROVIDER_SITE_OTHER): Payer: Medicare Other | Admitting: Internal Medicine

## 2013-04-18 ENCOUNTER — Encounter: Payer: Self-pay | Admitting: Internal Medicine

## 2013-04-18 VITALS — BP 141/68 | HR 70 | Ht 66.0 in | Wt 138.2 lb

## 2013-04-18 DIAGNOSIS — I10 Essential (primary) hypertension: Secondary | ICD-10-CM | POA: Diagnosis not present

## 2013-04-18 DIAGNOSIS — I495 Sick sinus syndrome: Secondary | ICD-10-CM | POA: Diagnosis not present

## 2013-04-18 DIAGNOSIS — I2510 Atherosclerotic heart disease of native coronary artery without angina pectoris: Secondary | ICD-10-CM | POA: Diagnosis not present

## 2013-04-18 NOTE — Progress Notes (Signed)
[name],[name], MD: Primary Cardiologist:  Dr Alease FrameVaranasi   Valerie Carr is a 80 y.o. female with a h/o bradycardia sp PPM (BSc) who presents today to establish care in the Electrophysiology device clinic.  Her initial device was implanted in 2004.  She underwent generator change by me in 2012. The patient reports doing very well since that time.  She does reasonably well for her age but does have some dementia.   Today, she  denies symptoms of palpitations, chest pain, shortness of breath, orthopnea, PND, lower extremity edema, dizziness, presyncope, syncope, or neurologic sequela.  The patientis tolerating medications without difficulties and is otherwise without complaint today.   Past Medical History  Diagnosis Date  . Arteriosclerotic cardiovascular disease (ASCVD)   . DM type 2 (diabetes mellitus, type 2)   . HTN (hypertension)   . Chronic anxiety     and depression  . Gallstones     asysmptomatic  . Sick sinus syndrome     s/p PPM  . Hyperlipidemia    Past Surgical History  Procedure Laterality Date  . Coronary artery bypass graft      07-11-2005, triple bypass  . Pacemaker insertion  2004    [employer] PPM implanted by Dr Amil AmenEdmunds, generator change by Dr Johney FrameAllred 01/07/11  . Total abdominal hysterectomy w/ bilateral salpingoophorectomy    . Appendectomy    . Tonsillectomy    . Esophagogastroduodenoscopy      History   Social History  . Marital Status: Widowed    Spouse Name: N/A    Number of Children: 3  . Years of Education: 9   Occupational History  . retired    Social History Main Topics  . Smoking status: Former Smoker -- 1.00 packs/day for 25 years  . Smokeless tobacco: Former NeurosurgeonUser     Comment: quit in 2001  . Alcohol Use: No  . Drug Use: No  . Sexual Activity: No   Other Topics Concern  . Not on file   Social History Narrative   [facility]   Caffeine Use: none   widowed    Family History  Problem  Relation Age of Onset  . Coronary artery disease Father   . Diabetes Mother   . Diabetes Brother   . Throat cancer Brother   . Diabetes Brother   . Hypertension Sister   . Cancer Son     esophageal    Allergies  Allergen Reactions  . Clarithromycin   . Codeine Phosphate   . Effexor [Venlafaxine Hydrochloride]   . Fexofenadine Hcl   . Lisinopril   . Prozac [Fluoxetine Hcl]   . Septra [Bactrim]   . Wellbutrin [Bupropion Hcl]     Current Outpatient Prescriptions  Medication Sig Dispense Refill  . aspirin 325 MG tablet Take 325 mg by mouth daily.      Marland Kitchen. donepezil (ARICEPT) 10 MG tablet Take 10 mg by mouth at bedtime.      . DULoxetine (CYMBALTA) 60 MG capsule Take 60 mg by mouth 2 (two) times daily.       Marland Kitchen. LORazepam (ATIVAN) 0.5 MG tablet Take one half to one tablet by mouth 3 times a day as needed      . memantine (NAMENDA) 10 MG tablet Take 1 tablet (10 mg total) by mouth 2 (two) times daily.  60 tablet  5  . metFORMIN (GLUCOPHAGE-XR) 500 MG 24 hr tablet Take 500 mg by mouth daily with  breakfast.        . rosuvastatin (CRESTOR) 40 MG tablet Take 20 mg by mouth daily.       . traZODone (DESYREL) 100 MG tablet Take 100 mg by mouth at bedtime.        . valsartan (DIOVAN) 160 MG tablet Take 160 mg by mouth daily.       No current facility-administered medications for this visit.    ROS- all systems are reviewed and negative except as per HPI  Physical Exam: Filed Vitals:   04/18/13 1441  BP: 141/68  Pulse: 70  Height: 5\' 6"  (1.676 m)  Weight: 138 lb 3.2 oz (62.687 kg)    GEN- The patient is elderly appearing, alert and oriented x 3 today.  But appears mildly confused Head- normocephalic, atraumatic Eyes-  Sclera clear, conjunctiva pink Ears- hearing intact Oropharynx- clear Neck- supple,   Lungs- Clear to ausculation bilaterally, normal work of breathing Chest- pacemaker pocket is well healed Heart- Regular rate and rhythm  GI- soft, NT, ND, + BS Extremities- no  clubbing, cyanosis, or edema MS- diffuse muscle atrophy Neuro- strength and sensation are intact  Pacemaker interrogation- reviewed in detail today,  See PACEART report  Assessment and Plan:  1. Sick sinus syndrome Normal pacemaker function See Pace Art report No changes today  2. afib- (per Dr Waverly Ferrari last note) Not a candidate for anticoagulation presently due to confusion over medications She says she is on warfarin but does not have INRs followed.  We have called her PCPs office today and they are not aware that she is on anticoagulation. She will call our office once she gets home so that my nurse can discuss her medicines with her.  3. htn Stable No change required today  Return to the device clinic in 6 months Follow-up with Brooke in 12 months Follow-up with Dr Eldridge Dace as scheduled.

## 2013-04-18 NOTE — Telephone Encounter (Signed)
New message     Pt is on crestor 30mg , aspirin 325mg , valsartan 160mg , namenda 10mg , donepedzil  hcl 10mg , trazodone 100mg , metforman 500mg , duloxetine hcl dr 60mg  She saw Dr Johney FrameAllred today and was to call back with her medication list

## 2013-04-18 NOTE — Patient Instructions (Addendum)
Your physician wants you to follow-up in: 6 month in device clinic and 12 months with Rick DuffBrooke Edmisten, PAYou will receive a reminder letter in the mail two months in advance. If you don't receive a letter, please call our office to schedule the follow-up appointment.    Please call Anselm PancoastKelly Lanier,RN when you get home to go over medications  [phone]

## 2013-04-18 NOTE — Telephone Encounter (Signed)
Patient called and her medication list is correct

## 2013-04-19 LAB — MDC_IDC_ENUM_SESS_TYPE_INCLINIC
Date Time Interrogation Session: 20150323040000
Lead Channel Impedance Value: 335 Ohm
Lead Channel Impedance Value: 508 Ohm
Lead Channel Pacing Threshold Pulse Width: 0.4 ms
Lead Channel Setting Pacing Amplitude: 2.4 V
Lead Channel Setting Pacing Pulse Width: 0.4 ms
Lead Channel Setting Sensing Sensitivity: 2.5 mV
MDC IDC MSMT LEADCHNL RA PACING THRESHOLD AMPLITUDE: 0.6 V
MDC IDC MSMT LEADCHNL RA PACING THRESHOLD PULSEWIDTH: 0.4 ms
MDC IDC MSMT LEADCHNL RA SENSING INTR AMPL: 1.2 mV
MDC IDC MSMT LEADCHNL RV PACING THRESHOLD AMPLITUDE: 0.7 V
MDC IDC MSMT LEADCHNL RV SENSING INTR AMPL: 7.6 mV
MDC IDC PG SERIAL: 120275
MDC IDC SET LEADCHNL RA PACING AMPLITUDE: 2 V
Zone Setting Detection Interval: 375 ms

## 2013-04-26 ENCOUNTER — Encounter: Payer: Self-pay | Admitting: Internal Medicine

## 2013-05-26 ENCOUNTER — Encounter (HOSPITAL_COMMUNITY): Payer: Self-pay | Admitting: Emergency Medicine

## 2013-05-26 ENCOUNTER — Emergency Department (HOSPITAL_COMMUNITY)
Admission: EM | Admit: 2013-05-26 | Discharge: 2013-05-26 | Disposition: A | Discharge status: Home / Self Care | Payer: Medicare Other | Attending: Emergency Medicine | Admitting: Emergency Medicine

## 2013-05-26 DIAGNOSIS — F411 Generalized anxiety disorder: Secondary | ICD-10-CM | POA: Diagnosis not present

## 2013-05-26 DIAGNOSIS — F329 Major depressive disorder, single episode, unspecified: Secondary | ICD-10-CM | POA: Insufficient documentation

## 2013-05-26 DIAGNOSIS — F3289 Other specified depressive episodes: Secondary | ICD-10-CM | POA: Insufficient documentation

## 2013-05-26 DIAGNOSIS — Z7982 Long term (current) use of aspirin: Secondary | ICD-10-CM | POA: Insufficient documentation

## 2013-05-26 DIAGNOSIS — E785 Hyperlipidemia, unspecified: Secondary | ICD-10-CM | POA: Insufficient documentation

## 2013-05-26 DIAGNOSIS — Z87891 Personal history of nicotine dependence: Secondary | ICD-10-CM | POA: Diagnosis not present

## 2013-05-26 DIAGNOSIS — Z951 Presence of aortocoronary bypass graft: Secondary | ICD-10-CM | POA: Diagnosis not present

## 2013-05-26 DIAGNOSIS — I10 Essential (primary) hypertension: Secondary | ICD-10-CM | POA: Insufficient documentation

## 2013-05-26 DIAGNOSIS — Z79899 Other long term (current) drug therapy: Secondary | ICD-10-CM | POA: Diagnosis not present

## 2013-05-26 DIAGNOSIS — Z950 Presence of cardiac pacemaker: Secondary | ICD-10-CM | POA: Diagnosis not present

## 2013-05-26 DIAGNOSIS — R5381 Other malaise: Secondary | ICD-10-CM | POA: Diagnosis not present

## 2013-05-26 DIAGNOSIS — R55 Syncope and collapse: Secondary | ICD-10-CM | POA: Diagnosis not present

## 2013-05-26 DIAGNOSIS — R404 Transient alteration of awareness: Secondary | ICD-10-CM | POA: Diagnosis not present

## 2013-05-26 DIAGNOSIS — E119 Type 2 diabetes mellitus without complications: Secondary | ICD-10-CM | POA: Diagnosis not present

## 2013-05-26 DIAGNOSIS — Z8719 Personal history of other diseases of the digestive system: Secondary | ICD-10-CM | POA: Insufficient documentation

## 2013-05-26 LAB — I-STAT CHEM 8, ED
BUN: 29 mg/dL — ABNORMAL HIGH (ref 6–23)
CALCIUM ION: 1.27 mmol/L (ref 1.13–1.30)
CHLORIDE: 101 meq/L (ref 96–112)
Creatinine, Ser: 1.3 mg/dL — ABNORMAL HIGH (ref 0.50–1.10)
GLUCOSE: 172 mg/dL — AB (ref 70–99)
HCT: 48 % — ABNORMAL HIGH (ref 36.0–46.0)
Hemoglobin: 16.3 g/dL — ABNORMAL HIGH (ref 12.0–15.0)
Potassium: 3.7 mEq/L (ref 3.7–5.3)
Sodium: 142 mEq/L (ref 137–147)
TCO2: 24 mmol/L (ref 0–100)

## 2013-05-26 LAB — CBC WITH DIFFERENTIAL/PLATELET
BASOS PCT: 0 % (ref 0–1)
Basophils Absolute: 0 10*3/uL (ref 0.0–0.1)
EOS PCT: 2 % (ref 0–5)
Eosinophils Absolute: 0.1 10*3/uL (ref 0.0–0.7)
HEMATOCRIT: 42.5 % (ref 36.0–46.0)
Hemoglobin: 14.9 g/dL (ref 12.0–15.0)
Lymphocytes Relative: 18 % (ref 12–46)
Lymphs Abs: 1.3 10*3/uL (ref 0.7–4.0)
MCH: 30.8 pg (ref 26.0–34.0)
MCHC: 35.1 g/dL (ref 30.0–36.0)
MCV: 88 fL (ref 78.0–100.0)
MONO ABS: 0.8 10*3/uL (ref 0.1–1.0)
MONOS PCT: 11 % (ref 3–12)
NEUTROS ABS: 5 10*3/uL (ref 1.7–7.7)
Neutrophils Relative %: 69 % (ref 43–77)
Platelets: 233 10*3/uL (ref 150–400)
RBC: 4.83 MIL/uL (ref 3.87–5.11)
RDW: 13.5 % (ref 11.5–15.5)
WBC: 7.2 10*3/uL (ref 4.0–10.5)

## 2013-05-26 LAB — URINE MICROSCOPIC-ADD ON

## 2013-05-26 LAB — URINALYSIS, ROUTINE W REFLEX MICROSCOPIC
Glucose, UA: 500 mg/dL — AB
HGB URINE DIPSTICK: NEGATIVE
KETONES UR: 15 mg/dL — AB
Nitrite: NEGATIVE
Protein, ur: 30 mg/dL — AB
SPECIFIC GRAVITY, URINE: 1.024 (ref 1.005–1.030)
Urobilinogen, UA: 1 mg/dL (ref 0.0–1.0)
pH: 5 (ref 5.0–8.0)

## 2013-05-26 NOTE — ED Provider Notes (Signed)
CSN: [id number]     Arrival date & time 05/26/13  1446 History   First MD Initiated Contact with Patient 05/26/13 1510     Chief Complaint  Patient presents with  . Near Syncope     (Consider location/radiation/quality/duration/timing/severity/associated sxs/prior Treatment) Patient is a 80 y.o. female presenting with near-syncope. The history is provided by the patient.  Near Syncope   She is here for evaluation of near-syncope, that occurred after shopping today. She recalls taking an extra blood pressure medicine, this morning, by accident. She ate breakfast, but not lunch. She denies recent illnesses, including fever, chills, nausea, vomiting, weakness, or dizziness. She feels better now , and would like to go home.  Past Medical History  Diagnosis Date  . Arteriosclerotic cardiovascular disease (ASCVD)   . DM type 2 (diabetes mellitus, type 2)   . HTN (hypertension)   . Chronic anxiety     and depression  . Gallstones     asysmptomatic  . Sick sinus syndrome     s/p PPM  . Hyperlipidemia    Past Surgical History  Procedure Laterality Date  . Coronary artery bypass graft      07-11-2005, triple bypass  . Pacemaker insertion  2004    [employer] PPM implanted by Dr Amil Amen, generator change by Dr Johney Frame 01/07/11  . Total abdominal hysterectomy w/ bilateral salpingoophorectomy    . Appendectomy    . Tonsillectomy    . Esophagogastroduodenoscopy     Family History  Problem Relation Age of Onset  . Coronary artery disease Father   . Diabetes Mother   . Diabetes Brother   . Throat cancer Brother   . Diabetes Brother   . Hypertension Sister   . Cancer Son     esophageal   History  Substance Use Topics  . Smoking status: Former Smoker -- 1.00 packs/day for 25 years  . Smokeless tobacco: Former Neurosurgeon     Comment: quit in 2001  . Alcohol Use: No   OB History   Grav Para Term Preterm Abortions TAB SAB Ect Mult Living                 Review of Systems   Cardiovascular: Positive for near-syncope.  All other systems reviewed and are negative.     Allergies  Clarithromycin; Codeine phosphate; Effexor; Fexofenadine hcl; Lisinopril; Prozac; Septra; and Wellbutrin  Home Medications   Prior to Admission medications   Medication Sig Start Date End Date Taking? Authorizing Provider  aspirin 325 MG tablet Take 325 mg by mouth daily.   Yes Historical Provider, MD  donepezil (ARICEPT) 10 MG tablet Take 10 mg by mouth at bedtime. 01/11/13  Yes Ronal Fear, NP  DULoxetine (CYMBALTA) 60 MG capsule Take 60 mg by mouth 2 (two) times daily.    Yes Historical Provider, MD  LORazepam (ATIVAN) 0.5 MG tablet Take one half to one tablet by mouth 3 times a day as needed   Yes Historical Provider, MD  memantine (NAMENDA) 10 MG tablet Take 1 tablet (10 mg total) by mouth 2 (two) times daily. 01/11/13  Yes Ronal Fear, NP  metFORMIN (GLUCOPHAGE-XR) 500 MG 24 hr tablet Take 500 mg by mouth daily with breakfast.     Yes Historical Provider, MD  rosuvastatin (CRESTOR) 40 MG tablet Take 20 mg by mouth daily.    Yes Historical Provider, MD  traZODone (DESYREL) 100 MG tablet Take 100 mg by mouth at bedtime.     Yes Historical  Provider, MD  valsartan (DIOVAN) 160 MG tablet Take 160 mg by mouth daily.   Yes Historical Provider, MD   BP 154/72  Pulse 73  Temp(Src) 97.9 F (36.6 C) (Oral)  Resp 19  SpO2 98% Physical Exam  Nursing note and vitals reviewed. Constitutional: She is oriented to person, place, and time. She appears well-developed.  Elderly, frail  HENT:  Head: Normocephalic and atraumatic.  Eyes: Conjunctivae and EOM are normal. Pupils are equal, round, and reactive to light.  Neck: Normal range of motion and phonation normal. Neck supple.  Cardiovascular: Normal rate, regular rhythm and intact distal pulses.   Pulmonary/Chest: Effort normal and breath sounds normal. She exhibits no tenderness.  Abdominal: Soft. She exhibits no distension. There is no  tenderness. There is no guarding.  Musculoskeletal: Normal range of motion.  Neurological: She is alert and oriented to person, place, and time. She exhibits normal muscle tone.  Skin: Skin is warm and dry.  Psychiatric: She has a normal mood and affect. Her behavior is normal. Judgment and thought content normal.    ED Course  Procedures (including critical care)  Medications - No data to display  Patient Vitals for the past 24 hrs:  BP Temp Temp src Pulse Resp SpO2  05/26/13 1644 - 97.9 F (36.6 C) Oral - 19 98 %  05/26/13 1634 154/72 mmHg - - 73 - -  05/26/13 1632 140/63 mmHg - - 72 - -  05/26/13 1630 140/59 mmHg - - 71 - -  05/26/13 1521 131/75 mmHg 97.9 F (36.6 C) Oral 70 16 100 %    5:30 PM Reevaluation with update and discussion. After initial assessment and treatment, an updated evaluation reveals orthostatic vital signs are normal. She is able to eat, drink and ambulate without problems. Findings discussed with patient and all questions answered. Flint MelterElliott L Lilley Hubble      Date: 04/3013  Rate: 70  Rhythm: normal sinus rhythm and indeterminate  QRS Axis: normal  PR and QT Intervals: normal  ST/T Wave abnormalities: normal  PR and QRS Conduction Disutrbances:none  Narrative Interpretation:   Old EKG Reviewed: changes noted- prior tracing was clearly sinus rhythym   Labs Review Labs Reviewed  URINALYSIS, ROUTINE W REFLEX MICROSCOPIC - Abnormal; Notable for the following:    APPearance HAZY (*)    Glucose, UA 500 (*)    Bilirubin Urine SMALL (*)    Ketones, ur 15 (*)    Protein, ur 30 (*)    Leukocytes, UA SMALL (*)    All other components within normal limits  URINE MICROSCOPIC-ADD ON - Abnormal; Notable for the following:    Squamous Epithelial / LPF FEW (*)    Bacteria, UA FEW (*)    Casts HYALINE CASTS (*)    Crystals CA OXALATE CRYSTALS (*)    All other components within normal limits  I-STAT CHEM 8, ED - Abnormal; Notable for the following:    BUN 29 (*)     Creatinine, Ser 1.30 (*)    Glucose, Bld 172 (*)    Hemoglobin 16.3 (*)    HCT 48.0 (*)    All other components within normal limits  URINE CULTURE  CBC WITH DIFFERENTIAL    Imaging Review No results found.   EKG Interpretation None      MDM   Final diagnoses:  Near syncope    Near-syncope, with likely cause being taking an extra Diovan, today. Doubt metabolic instability, or serious bacterial infection.  Nursing Notes Reviewed/  Care Coordinated Applicable Imaging Reviewed Interpretation of Laboratory Data incorporated into ED treatment  The patient appears reasonably screened and/or stabilized for discharge and I doubt any other medical condition or other [facility] requiring further screening, evaluation, or treatment in the ED at this time prior to discharge.  Plan: Home Medications- usual; Home Treatments- rest; return here if the recommended treatment, does not improve the symptoms; Recommended follow up- PCP, when necessary     Flint MelterElliott L Javeah Loeza, MD 05/26/13 1731

## 2013-05-26 NOTE — Discharge Instructions (Signed)
Get plenty of rest, and drink a lot of fluids. Do not take extra blood pressure medicine, and this yourr doctor advises you to do that.   Near-Syncope Near-syncope (commonly known as near fainting) is sudden weakness, dizziness, or feeling like you might pass out. During an episode of near-syncope, you may also develop pale skin, have tunnel vision, or feel sick to your stomach (nauseous). Near-syncope may occur when getting up after sitting or while standing for a long time. It is caused by a sudden decrease in blood flow to the brain. This decrease can result from various causes or triggers, most of which are not serious. However, because near-syncope can sometimes be a sign of something serious, a medical evaluation is required. The specific cause is often not determined. HOME CARE INSTRUCTIONS  Monitor your condition for any changes. The following actions may help to alleviate any discomfort you are experiencing:  Have someone stay with you until you feel stable.  Lie down right away if you start feeling like you might faint. Breathe deeply and steadily. Wait until all the symptoms have passed. Most of these episodes last only a few minutes. You may feel tired for several hours.   Drink enough fluids to keep your urine clear or pale yellow.   If you are taking blood pressure or heart medicine, get up slowly when seated or lying down. Take several minutes to sit and then stand. This can reduce dizziness.  Follow up with your health care provider as directed. SEEK IMMEDIATE MEDICAL CARE IF:   You have a severe headache.   You have unusual pain in the chest, abdomen, or back.   You are bleeding from the mouth or rectum, or you have black or tarry stool.   You have an irregular or very fast heartbeat.   You have repeated fainting or have seizure-like jerking during an episode.   You faint when sitting or lying down.   You have confusion.   You have difficulty walking.    You have severe weakness.   You have vision problems.  MAKE SURE YOU:   Understand these instructions.  Will watch your condition.  Will get help right away if you are not doing well or get worse. Document Released: 01/13/2005 Document Revised: 09/15/2012 Document Reviewed: 06/18/2012 [facility] Patient Information 2014 [employer] [place].

## 2013-05-26 NOTE — ED Notes (Addendum)
[facility] [place]. Pt is a resident at [facility] [place]. [facility] had a near syncopal episode and became diaphoretic. Hx of DM, CBG 186. Pt BP 100/62 and pt normally has systolic BP in the 140s. Pt also stated to EMS that she could not remember if she took her BP medication this morning and took another one.

## 2013-05-26 NOTE — ED Notes (Signed)
Pt resting at this time.  Eating Malaw[place]key sandwich

## 2013-05-27 LAB — URINE CULTURE: Special Requests: NORMAL

## 2013-05-28 NOTE — Progress Notes (Signed)
ED Antimicrobial Stewardship Positive Culture Follow Up   Valerie Carr is an 80 y.o. female who [facility] with a chief complaint of  Chief Complaint  Patient presents with  . Near Syncope    Recent Results (from the past 720 hour(s))  URINE CULTURE     Status: None   Collection Time    05/26/13  4:48 PM      Result Value Ref Range Status   Specimen Description URINE, CLEAN CATCH   Final   Special Requests Normal   Final   Culture  Setup Time     Final   Value: 05/26/2013 18:35     Performed at [facility]   Colony Count     Final   Value: >=100,000 COLONIES/ML     Performed at [facility]   Culture     Final   Value: GROUP B STREP(S.AGALACTIAE)ISOLATED     Note: TESTING AGAINST S. AGALACTIAE NOT ROUTINELY PERFORMED DUE TO PREDICTABILITY OF AMP/PEN/VAN SUSCEPTIBILITY.     Performed at [facility]   Report Status 05/27/2013 FINAL   Final    [x]  Patient discharged originally without antimicrobial agent and treatment is now indicated  Elderly came in with near syncope. UA showed some WBC. No fevers. Urine culture came back with >100k group B strep. Will give her a short course of amoxicillin  New antibiotic prescription:   Amoxicillin 500mg  PO TID x7 days  ED Provider: Santiago GladHeather Laisure, PA   Ulyses SouthwardMinh Pham, PharmD Pager: [phone] Infectious Diseases Pharmacist Phone# [phone]

## 2013-05-29 ENCOUNTER — Telehealth (HOSPITAL_BASED_OUTPATIENT_CLINIC_OR_DEPARTMENT_OTHER): Payer: Self-pay | Admitting: Emergency Medicine

## 2013-05-29 NOTE — Telephone Encounter (Signed)
Post ED Visit - Positive Culture Follow-up: Successful Patient Follow-Up  Culture assessed and recommendations reviewed by: []  Wes [place], Pharm.D., BCPS []  Celedonio MiyamotoJeremy Frens, [address] BCPS []  Georgina PillionElizabeth Martin, Pharm.D., BCPS [x]  ChewelahMinh Pham, [address] BCPS, AAHIVP []  Estella HuskMichelle Turner, Pharm.D., BCPS, AAHIVP  Positive urine culture  [x]  Patient discharged without antimicrobial prescription and treatment is now indicated []  Organism is resistant to prescribed ED discharge antimicrobial []  Patient with positive blood cultures  Changes discussed with ED provider: Santiago GladHeather Laisure PA-C New antibiotic prescription: Amoxicillin 500 mg PO TID x 7 days   [facility] Aliece Honold 05/29/2013, 6:20 PM

## 2013-06-06 DIAGNOSIS — R55 Syncope and collapse: Secondary | ICD-10-CM | POA: Diagnosis not present

## 2013-06-06 DIAGNOSIS — F329 Major depressive disorder, single episode, unspecified: Secondary | ICD-10-CM | POA: Diagnosis not present

## 2013-06-06 DIAGNOSIS — F0390 Unspecified dementia without behavioral disturbance: Secondary | ICD-10-CM | POA: Diagnosis not present

## 2013-06-09 ENCOUNTER — Telehealth (HOSPITAL_BASED_OUTPATIENT_CLINIC_OR_DEPARTMENT_OTHER): Payer: Self-pay | Admitting: Emergency Medicine

## 2013-07-12 ENCOUNTER — Encounter: Payer: Self-pay | Admitting: Neurology

## 2013-07-12 ENCOUNTER — Ambulatory Visit (INDEPENDENT_AMBULATORY_CARE_PROVIDER_SITE_OTHER): Payer: Medicare Other | Admitting: Neurology

## 2013-07-12 VITALS — BP 110/59 | HR 75 | Ht 66.25 in | Wt 230.0 lb

## 2013-07-12 DIAGNOSIS — I2510 Atherosclerotic heart disease of native coronary artery without angina pectoris: Secondary | ICD-10-CM | POA: Diagnosis not present

## 2013-07-12 DIAGNOSIS — F0390 Unspecified dementia without behavioral disturbance: Secondary | ICD-10-CM | POA: Diagnosis not present

## 2013-07-12 NOTE — Patient Instructions (Signed)
I had a long discussion the patient and her daughter regarding her dementia, recent personality change and behavior and discussed behavior modification techniques. She has remained stable on her cognitive testing and hence we'll continue Aricept and Namenda and the current dosages. I have advised her to limit her driving to bare minimum as patient seems reluctant to give up driving completely at the present time. Return for followup in 3 months with Heide GuileLynn Lam, NP  or call earlier if necessary
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
## 2013-07-14 ENCOUNTER — Encounter: Payer: Self-pay | Admitting: Neurology

## 2013-07-18 ENCOUNTER — Telehealth: Payer: Self-pay | Admitting: Neurology

## 2013-07-18 NOTE — Telephone Encounter (Signed)
[name] called back is requesting that Dr Marlis EdelsonSethi's nurse to please call her back concerning pt's last visit on 07/12/13. Thanks

## 2013-07-18 NOTE — Telephone Encounter (Signed)
I have talked with the daughter and question was answered. I wast advised by lynn the NP for Dr. Pearlean BrownieSethi

## 2013-07-18 NOTE — Telephone Encounter (Signed)
Patient's daughter returning a missed call to our office, please call patient's daughter and advise.

## 2013-08-03 ENCOUNTER — Other Ambulatory Visit: Payer: Self-pay | Admitting: Nurse Practitioner

## 2013-09-09 ENCOUNTER — Telehealth: Payer: Self-pay | Admitting: Neurology

## 2013-09-09 NOTE — Telephone Encounter (Signed)
I have changed it in the system

## 2013-09-09 NOTE — Telephone Encounter (Signed)
Patient's daughter calling to state that patient has changed her pharmacy to [facility] on [address]. If questions, please call.

## 2013-10-12 ENCOUNTER — Ambulatory Visit (INDEPENDENT_AMBULATORY_CARE_PROVIDER_SITE_OTHER): Payer: Medicare Other | Admitting: Nurse Practitioner

## 2013-10-12 ENCOUNTER — Encounter: Payer: Self-pay | Admitting: Nurse Practitioner

## 2013-10-12 VITALS — BP 152/68 | HR 70 | Ht 66.0 in | Wt 124.2 lb

## 2013-10-12 DIAGNOSIS — F0282 Dementia in other diseases classified elsewhere, unspecified severity, with psychotic disturbance: Secondary | ICD-10-CM | POA: Insufficient documentation

## 2013-10-12 DIAGNOSIS — F0280 Dementia in other diseases classified elsewhere without behavioral disturbance: Secondary | ICD-10-CM | POA: Diagnosis not present

## 2013-10-12 DIAGNOSIS — G309 Alzheimer's disease, unspecified: Secondary | ICD-10-CM | POA: Diagnosis not present

## 2013-10-12 DIAGNOSIS — I2510 Atherosclerotic heart disease of native coronary artery without angina pectoris: Secondary | ICD-10-CM

## 2013-10-12 DIAGNOSIS — F22 Delusional disorders: Secondary | ICD-10-CM
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
## 2013-10-12 NOTE — Patient Instructions (Addendum)
I think overall you are doing fairly well but I do want to suggest a few things today:  Remember to drink plenty of fluid, eat healthy meals and do not skip any meals. Try to eat protein with a every meal and eat a healthy snack such as fruit or nuts in between meals. Try to keep a regular sleep-wake schedule and try to exercise daily, particularly in the form of walking, 20-30 minutes a day, if you can. Good nutrition, proper sleep and exercise can help her cognitive function.  I have advised her to limit her driving to bare minimum as patient seems reluctant to give up driving completely at the present time.   Engage in social activities in your community and with your family and try to keep up with current events by reading the newspaper or watching the news. Also, you may like to do word finding puzzles or crossword puzzles.  As far as your medications are concerned, I would like to suggest continuing Aricept and Namenda.   As far as diagnostic testing: none needed at this time.  I would like to see you back in 6 months, sooner if we need to. Please call us with any interim questions, concerns, problems, updates or refill requests.  Test results will be released to MyChart. Lynden Ang is my Health visitor and will answer any of your questions and relay your messages to me and also relay most of my messages to you.  Our phone number is [phone]. We also have an after hours call service for urgent matters and there is a physician on-call for urgent questions. For any emergencies you know to call 911 or go to the nearest emergency room.

## 2013-10-14 NOTE — Progress Notes (Signed)
I agree with the above plan 

## 2013-10-19 ENCOUNTER — Ambulatory Visit (INDEPENDENT_AMBULATORY_CARE_PROVIDER_SITE_OTHER): Payer: Medicare Other | Admitting: *Deleted

## 2013-10-19 ENCOUNTER — Encounter: Payer: Self-pay | Admitting: Internal Medicine

## 2013-10-19 DIAGNOSIS — I498 Other specified cardiac arrhythmias: Secondary | ICD-10-CM

## 2013-10-19 DIAGNOSIS — R001 Bradycardia, unspecified: Secondary | ICD-10-CM

## 2013-10-19 LAB — MDC_IDC_ENUM_SESS_TYPE_INCLINIC
Battery Remaining Longevity: 77 mo
Date Time Interrogation Session: 20150923040000
Implantable Pulse Generator Serial Number: 120275
Lead Channel Impedance Value: 594 Ohm
Lead Channel Pacing Threshold Amplitude: 0.4 V
Lead Channel Pacing Threshold Amplitude: 0.6 V
Lead Channel Pacing Threshold Pulse Width: 0.4 ms
Lead Channel Sensing Intrinsic Amplitude: 1.6 mV
Lead Channel Sensing Intrinsic Amplitude: 10.1 mV
Lead Channel Setting Pacing Amplitude: 2 V
Lead Channel Setting Pacing Amplitude: 2.4 V
Lead Channel Setting Pacing Pulse Width: 0.4 ms
Lead Channel Setting Sensing Sensitivity: 2.5 mV
MDC IDC MSMT LEADCHNL RA IMPEDANCE VALUE: 364 Ohm
MDC IDC MSMT LEADCHNL RV PACING THRESHOLD PULSEWIDTH: 0.4 ms
MDC IDC SET ZONE DETECTION INTERVAL: 375 ms
MDC IDC STAT BRADY RA PERCENT PACED: 87 %
MDC IDC STAT BRADY RV PERCENT PACED: 6 %

## 2013-10-19 NOTE — Progress Notes (Signed)
Pacemaker check in clinic. Normal device function. Thresholds, sensing, impedances consistent with previous measurements. Device programmed to maximize longevity. 1 mode switch < 1 minute.  12 high ventricular rates noted  SVT. Device programmed at appropriate safety margins. Histogram distribution appropriate for patient activity level. Device programmed to optimize intrinsic conduction. Estimated longevity 6.5 years.  Patient education completed.  ROV 3 months withi Dr. Johney Frame.

## 2013-10-21 ENCOUNTER — Encounter: Payer: Self-pay | Admitting: Cardiology

## 2013-12-12 ENCOUNTER — Encounter: Payer: Self-pay | Admitting: Neurology

## 2013-12-26 ENCOUNTER — Telehealth: Payer: Self-pay | Admitting: Neurology

## 2013-12-26 NOTE — Telephone Encounter (Signed)
Patient's daughter requesting letter stating patient should no longer drive, please advise.
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
## 2013-12-26 NOTE — Telephone Encounter (Signed)
Ok to do so

## 2013-12-27 ENCOUNTER — Encounter: Payer: Self-pay | Admitting: *Deleted

## 2013-12-28 NOTE — Telephone Encounter (Signed)
Spoke with [name] and informed her that letter was ready for pick up at the front desk, she verbalized understanding and had no further questions or concerns.

## 2014-01-04 ENCOUNTER — Encounter (HOSPITAL_COMMUNITY): Payer: Self-pay | Admitting: Internal Medicine

## 2014-01-12 ENCOUNTER — Ambulatory Visit: Payer: Medicare Other | Admitting: Interventional Cardiology

## 2014-01-25 ENCOUNTER — Telehealth: Payer: Self-pay | Admitting: Neurology

## 2014-01-25 NOTE — Telephone Encounter (Signed)
Patient's daughter, [name] @ [phone] stated dementia has worsened.  Extremely forgetful and will ask same question within 20 minutes.  Requesting an earlier appointment then 04/13/14.  Please call and advise.

## 2014-01-26 NOTE — Telephone Encounter (Signed)
Spoke with patient's daughter and she states that patients conditioned has worsened, wanted patient to be seen sooner than already scheduled appointment in March 2016. NP Megan agreed to see patient, appointment was scheduled for 02/06/13.

## 2014-01-27 ENCOUNTER — Other Ambulatory Visit: Payer: Self-pay | Admitting: Neurology

## 2014-01-30 ENCOUNTER — Telehealth: Payer: Self-pay | Admitting: Neurology

## 2014-01-30 NOTE — Telephone Encounter (Signed)
Patient's daughter is calling with a change in patient's pharmacy to [facility] on [address].

## 2014-01-30 NOTE — Telephone Encounter (Signed)
I called back.  Valerie Carr said they only want pharmacy updated in chart, nothing further is needed at this time.  Info has been updated.

## 2014-01-31 ENCOUNTER — Other Ambulatory Visit: Payer: Self-pay

## 2014-01-31 DIAGNOSIS — H04123 Dry eye syndrome of bilateral lacrimal glands: Secondary | ICD-10-CM | POA: Diagnosis not present

## 2014-01-31 DIAGNOSIS — H16223 Keratoconjunctivitis sicca, not specified as Sjogren's, bilateral: Secondary | ICD-10-CM | POA: Diagnosis not present

## 2014-01-31 DIAGNOSIS — H02125 Mechanical ectropion of left lower eyelid: Secondary | ICD-10-CM | POA: Diagnosis not present

## 2014-01-31 MED ORDER — DONEPEZIL HCL 10 MG PO TABS: 10.0000 mg | ORAL_TABLET | Freq: Every day | ORAL | Status: DC

## 2014-02-06 ENCOUNTER — Ambulatory Visit: Payer: Self-pay | Admitting: Adult Health

## 2014-02-15 ENCOUNTER — Ambulatory Visit: Payer: Medicare Other | Admitting: Interventional Cardiology

## 2014-02-28 ENCOUNTER — Other Ambulatory Visit: Payer: Self-pay

## 2014-02-28 MED ORDER — DONEPEZIL HCL 10 MG PO TABS
10.0000 mg | ORAL_TABLET | Freq: Every day | ORAL | Status: DC
Start: 2014-02-28 — End: 2014-03-28

## 2014-03-02 DIAGNOSIS — H02122 Mechanical ectropion of right lower eyelid: Secondary | ICD-10-CM | POA: Diagnosis not present

## 2014-03-02 DIAGNOSIS — H16213 Exposure keratoconjunctivitis, bilateral: Secondary | ICD-10-CM | POA: Diagnosis not present

## 2014-03-02 DIAGNOSIS — E119 Type 2 diabetes mellitus without complications: Secondary | ICD-10-CM | POA: Diagnosis not present

## 2014-03-02 DIAGNOSIS — H1851 Endothelial corneal dystrophy: Secondary | ICD-10-CM | POA: Diagnosis not present

## 2014-03-09 ENCOUNTER — Ambulatory Visit (HOSPITAL_COMMUNITY): Payer: Medicare Other | Attending: Interventional Cardiology | Admitting: Cardiology

## 2014-03-09 ENCOUNTER — Encounter: Payer: Self-pay | Admitting: Interventional Cardiology

## 2014-03-09 ENCOUNTER — Ambulatory Visit (INDEPENDENT_AMBULATORY_CARE_PROVIDER_SITE_OTHER): Payer: Medicare Other | Admitting: Interventional Cardiology

## 2014-03-09 VITALS — BP 128/84 | HR 79 | Ht 66.0 in | Wt 123.0 lb

## 2014-03-09 DIAGNOSIS — I2510 Atherosclerotic heart disease of native coronary artery without angina pectoris: Secondary | ICD-10-CM | POA: Diagnosis not present

## 2014-03-09 DIAGNOSIS — R0989 Other specified symptoms and signs involving the circulatory and respiratory systems: Secondary | ICD-10-CM | POA: Insufficient documentation

## 2014-03-09 DIAGNOSIS — E782 Mixed hyperlipidemia: Secondary | ICD-10-CM | POA: Diagnosis not present

## 2014-03-09 DIAGNOSIS — G309 Alzheimer's disease, unspecified: Secondary | ICD-10-CM

## 2014-03-09 DIAGNOSIS — I10 Essential (primary) hypertension: Secondary | ICD-10-CM

## 2014-03-09 DIAGNOSIS — F0280 Dementia in other diseases classified elsewhere without behavioral disturbance: Secondary | ICD-10-CM

## 2014-03-09 DIAGNOSIS — I6523 Occlusion and stenosis of bilateral carotid arteries: Secondary | ICD-10-CM | POA: Diagnosis not present
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
## 2014-03-09 NOTE — Progress Notes (Signed)
Carotid duplex performed 

## 2014-03-09 NOTE — Patient Instructions (Signed)
Your physician recommends that you continue on your current medications as directed. Please refer to the Current Medication list given to you today.  Your physician has requested that you have a carotid duplex. This test is an ultrasound of the carotid arteries in your neck. It looks at blood flow through these arteries that supply the brain with blood. Allow one hour for this exam. There are no restrictions or special instructions.  Your physician wants you to follow-up in: 1 year with Dr. [name]. You will receive a reminder letter in the mail two months in advance. If you don't receive a letter, please call our office to schedule the follow-up appointment.  

## 2014-03-16 ENCOUNTER — Encounter: Payer: Self-pay | Admitting: Adult Health

## 2014-03-16 ENCOUNTER — Telehealth: Payer: Self-pay | Admitting: Adult Health

## 2014-03-16 ENCOUNTER — Ambulatory Visit (INDEPENDENT_AMBULATORY_CARE_PROVIDER_SITE_OTHER): Payer: Medicare Other | Admitting: Adult Health

## 2014-03-16 VITALS — BP 209/89 | HR 70 | Ht 65.0 in | Wt 124.0 lb

## 2014-03-16 DIAGNOSIS — I6523 Occlusion and stenosis of bilateral carotid arteries: Secondary | ICD-10-CM

## 2014-03-16 DIAGNOSIS — R413 Other amnesia: Secondary | ICD-10-CM

## 2014-03-16 NOTE — Progress Notes (Signed)
I agree with the assessment and plan as directed by NP .The patient is known to me .   Elliette Seabolt, MD  
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
## 2014-03-16 NOTE — Patient Instructions (Signed)
Continue Aricept and Namenda Please get a pill box and have your daughter separate your medication.  Consider having an aide in the home or changing to an assistive living situation with more supervision  [facility]: [phone]
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
## 2014-03-28 ENCOUNTER — Other Ambulatory Visit: Payer: Self-pay | Admitting: *Deleted

## 2014-03-28 DIAGNOSIS — I739 Peripheral vascular disease, unspecified: Secondary | ICD-10-CM | POA: Diagnosis not present

## 2014-03-28 DIAGNOSIS — F419 Anxiety disorder, unspecified: Secondary | ICD-10-CM | POA: Diagnosis not present

## 2014-03-28 DIAGNOSIS — I5022 Chronic systolic (congestive) heart failure: Secondary | ICD-10-CM | POA: Diagnosis not present

## 2014-03-28 DIAGNOSIS — I651 Occlusion and stenosis of basilar artery: Secondary | ICD-10-CM | POA: Diagnosis not present

## 2014-03-28 DIAGNOSIS — E1151 Type 2 diabetes mellitus with diabetic peripheral angiopathy without gangrene: Secondary | ICD-10-CM | POA: Diagnosis not present

## 2014-03-28 DIAGNOSIS — I480 Paroxysmal atrial fibrillation: Secondary | ICD-10-CM | POA: Diagnosis not present

## 2014-03-28 DIAGNOSIS — I129 Hypertensive chronic kidney disease with stage 1 through stage 4 chronic kidney disease, or unspecified chronic kidney disease: Secondary | ICD-10-CM | POA: Diagnosis not present

## 2014-03-28 DIAGNOSIS — E785 Hyperlipidemia, unspecified: Secondary | ICD-10-CM | POA: Diagnosis not present

## 2014-03-28 DIAGNOSIS — F0390 Unspecified dementia without behavioral disturbance: Secondary | ICD-10-CM | POA: Diagnosis not present

## 2014-03-28 DIAGNOSIS — N183 Chronic kidney disease, stage 3 (moderate): Secondary | ICD-10-CM | POA: Diagnosis not present

## 2014-03-28 DIAGNOSIS — E1121 Type 2 diabetes mellitus with diabetic nephropathy: Secondary | ICD-10-CM | POA: Diagnosis not present

## 2014-03-28 DIAGNOSIS — I2510 Atherosclerotic heart disease of native coronary artery without angina pectoris: Secondary | ICD-10-CM | POA: Diagnosis not present

## 2014-03-28 MED ORDER — DONEPEZIL HCL 10 MG PO TABS: 10.0000 mg | ORAL_TABLET | Freq: Every day | ORAL | Status: DC

## 2014-03-30 ENCOUNTER — Telehealth: Payer: Self-pay | Admitting: *Deleted

## 2014-03-30 NOTE — Telephone Encounter (Signed)
Vickie spoke to Nepalkathy the daughter and the daughter is wanting the patient to have someone come into the assisted living a couple days a week. Vickie is working with the family and has gotten the ok to call here to see if MM would be willing to fill out the paper work to have this done. Vickie is the service coordinator over at the home. If there are any questions please call Vickie or daughter. Vickie will be at the office today until 3 and out on Friday.  Please advise.

## 2014-04-03 NOTE — Telephone Encounter (Signed)
I called Valerie Carr and left a message for her to call our office at her convenience.

## 2014-04-13 ENCOUNTER — Ambulatory Visit: Payer: Medicare Other | Admitting: Neurology

## 2014-04-13 ENCOUNTER — Ambulatory Visit: Payer: Medicare Other | Admitting: Nurse Practitioner

## 2014-04-27 ENCOUNTER — Other Ambulatory Visit: Payer: Self-pay | Admitting: *Deleted

## 2014-04-27 DIAGNOSIS — F03918 Unspecified dementia, unspecified severity, with other behavioral disturbance: Secondary | ICD-10-CM

## 2014-04-27 DIAGNOSIS — F0391 Unspecified dementia with behavioral disturbance: Secondary | ICD-10-CM

## 2014-04-27 DIAGNOSIS — E0821 Diabetes mellitus due to underlying condition with diabetic nephropathy: Secondary | ICD-10-CM

## 2014-04-27 NOTE — Patient Outreach (Signed)
[facility] Care Management  04/27/2014  Valerie Carr 02-Jan-1930 [id number]   MD referral:   Telephone call to designated contact person who is daughter-[name].  States she is Healthcare power of attorney for her mother. HIPPA verification received for patient's date of birth and home address from daughter.  She was advised of MD referral and of [facility] services.   States major healthcare issue is patient has dementia with short term memory loss.  States patient is taking own medication. Daughter states she checks pills at end of 2 week period and sometimes all of pills have not been taken.  States  patient is able to function with her assistance. Patient lives alone in independent senior living apartment complex. Daughter seeing patient daily; taking patient to doctors' appointments & making her appointments and; getting prescriptions refilled for patient.  States eventually patient will need assisted living in memory care unit but does not  feel she needs it currently.  Currently no services in the home of patient.   Daughter consents to services of [facility] care [facility].   Plan: will refer to RN  community care coordinator to evaluate living situation and needs.Colleen Can.   Kalab Camps, RN BSN CCM Care Management Coordinator [facility]  [phone]

## 2014-05-01 NOTE — Patient Instructions (Addendum)
This RNCM received telephone call from patient's daughter, Lynden AngCathy, who declined services at this time. Daughter asked that future communications come through her instead of her mother, who has dementia.  Referring [facility] RN, Colleen CanLinda Manning called to advise her of the above request.  Appointment scheduled for April 5 in [facility] cancelled.

## 2014-05-02 ENCOUNTER — Other Ambulatory Visit: Payer: Medicare Other

## 2014-05-05 ENCOUNTER — Other Ambulatory Visit: Payer: Self-pay | Admitting: Neurology

## 2014-05-05 NOTE — Patient Outreach (Signed)
[facility]) Care Management  05/05/2014  Valerie Carr 09/14/1929 [id number]   Received notification from Emilia BeckPamela Faulkner, CSW to close case due to patients daughter, Valerie Carr, does not want to be contacted by [facility].  Case closed at this time.  Corrie MckusickLisa O. [facility] Care [facility] CM Assistant Phone: [phone] Fax: [phone]

## 2014-05-30 ENCOUNTER — Telehealth: Payer: Self-pay | Admitting: Neurology
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
## 2014-05-30 NOTE — Telephone Encounter (Signed)
Second unsuccessful attempt to reach [place], patient's daughter.

## 2014-05-30 NOTE — Telephone Encounter (Signed)
Attempted to reach daughter, [name]. Not available and phone mailbox full. Unable to leave message.

## 2014-05-30 NOTE — Telephone Encounter (Signed)
Patient's daughter [name] is returning your call.  She will have her phone with her this afternoon [phone].  Thanks!
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
## 2014-05-30 NOTE — Telephone Encounter (Signed)
Thanks; agree with plan

## 2014-05-31 ENCOUNTER — Ambulatory Visit (INDEPENDENT_AMBULATORY_CARE_PROVIDER_SITE_OTHER): Payer: Medicare Other | Admitting: Neurology

## 2014-05-31 ENCOUNTER — Encounter: Payer: Self-pay | Admitting: Neurology

## 2014-05-31 DIAGNOSIS — I10 Essential (primary) hypertension: Secondary | ICD-10-CM | POA: Diagnosis not present

## 2014-05-31 DIAGNOSIS — I25810 Atherosclerosis of coronary artery bypass graft(s) without angina pectoris: Secondary | ICD-10-CM | POA: Diagnosis not present

## 2014-05-31 DIAGNOSIS — I482 Chronic atrial fibrillation, unspecified: Secondary | ICD-10-CM

## 2014-05-31 DIAGNOSIS — I6529 Occlusion and stenosis of unspecified carotid artery: Secondary | ICD-10-CM | POA: Insufficient documentation

## 2014-05-31 DIAGNOSIS — Z950 Presence of cardiac pacemaker: Secondary | ICD-10-CM

## 2014-05-31 DIAGNOSIS — I6523 Occlusion and stenosis of bilateral carotid arteries: Secondary | ICD-10-CM

## 2014-05-31 DIAGNOSIS — E785 Hyperlipidemia, unspecified: Secondary | ICD-10-CM | POA: Insufficient documentation

## 2014-05-31 DIAGNOSIS — G309 Alzheimer's disease, unspecified: Secondary | ICD-10-CM

## 2014-05-31 DIAGNOSIS — F0280 Dementia in other diseases classified elsewhere without behavioral disturbance: Secondary | ICD-10-CM

## 2014-05-31 NOTE — Patient Instructions (Signed)
-   continue ASA and crestor for stroke prevention - continue aricept and namenda for dementia treatment - recommend assistant living facility to get extra help.  - follow up with PCP regularly - follow up in 3 months

## 2014-05-31 NOTE — Telephone Encounter (Signed)
Patient rescheduled for FU today with Dr Roda ShuttersXu; Dr Pearlean BrownieSethi agreed, Dr Roda ShuttersXu aware and agreed. Appointment to complete papers for SW to assist [name], patient's daughter with placement of Ms Joanne GavelSutton. [name] notified of appt today per Annabelle Harmanana.
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
## 2014-05-31 NOTE — Telephone Encounter (Signed)
thanks

## 2014-06-01 ENCOUNTER — Telehealth: Payer: Self-pay | Admitting: *Deleted

## 2014-06-01 NOTE — Telephone Encounter (Signed)
Spoke with [name] and informed her [facility] Form is ready at front desk for her to pick up. She verbalized understanding, appreciation.

## 2014-06-07 DIAGNOSIS — F419 Anxiety disorder, unspecified: Secondary | ICD-10-CM | POA: Diagnosis not present

## 2014-06-07 DIAGNOSIS — F331 Major depressive disorder, recurrent, moderate: Secondary | ICD-10-CM | POA: Diagnosis not present

## 2014-06-07 DIAGNOSIS — N183 Chronic kidney disease, stage 3 (moderate): Secondary | ICD-10-CM | POA: Diagnosis not present

## 2014-06-07 DIAGNOSIS — G309 Alzheimer's disease, unspecified: Secondary | ICD-10-CM | POA: Diagnosis not present

## 2014-06-07 DIAGNOSIS — E1151 Type 2 diabetes mellitus with diabetic peripheral angiopathy without gangrene: Secondary | ICD-10-CM | POA: Diagnosis not present

## 2014-06-07 DIAGNOSIS — I129 Hypertensive chronic kidney disease with stage 1 through stage 4 chronic kidney disease, or unspecified chronic kidney disease: Secondary | ICD-10-CM | POA: Diagnosis not present

## 2014-06-07 DIAGNOSIS — E1121 Type 2 diabetes mellitus with diabetic nephropathy: Secondary | ICD-10-CM | POA: Diagnosis not present

## 2014-06-12 DIAGNOSIS — Z111 Encounter for screening for respiratory tuberculosis: Secondary | ICD-10-CM | POA: Diagnosis not present

## 2014-07-24 ENCOUNTER — Other Ambulatory Visit: Payer: Self-pay

## 2014-09-04 ENCOUNTER — Ambulatory Visit: Payer: Medicare Other | Admitting: Neurology

## 2014-09-05 ENCOUNTER — Encounter: Payer: Self-pay | Admitting: Neurology

## 2014-09-05 ENCOUNTER — Ambulatory Visit (INDEPENDENT_AMBULATORY_CARE_PROVIDER_SITE_OTHER): Payer: Medicare Other | Admitting: Neurology

## 2014-09-05 VITALS — BP 185/79 | HR 70 | Ht 66.0 in | Wt 120.2 lb

## 2014-09-05 DIAGNOSIS — I6523 Occlusion and stenosis of bilateral carotid arteries: Secondary | ICD-10-CM

## 2014-09-05 DIAGNOSIS — I25810 Atherosclerosis of coronary artery bypass graft(s) without angina pectoris: Secondary | ICD-10-CM | POA: Diagnosis not present

## 2014-09-05 DIAGNOSIS — G309 Alzheimer's disease, unspecified: Secondary | ICD-10-CM | POA: Diagnosis not present

## 2014-09-05 DIAGNOSIS — I10 Essential (primary) hypertension: Secondary | ICD-10-CM | POA: Diagnosis not present

## 2014-09-05 DIAGNOSIS — Z950 Presence of cardiac pacemaker: Secondary | ICD-10-CM

## 2014-09-05 DIAGNOSIS — I482 Chronic atrial fibrillation, unspecified: Secondary | ICD-10-CM

## 2014-09-05 DIAGNOSIS — E785 Hyperlipidemia, unspecified: Secondary | ICD-10-CM

## 2014-09-05 DIAGNOSIS — F0280 Dementia in other diseases classified elsewhere without behavioral disturbance: Secondary | ICD-10-CM

## 2014-09-05 NOTE — Patient Instructions (Signed)
-   continue ASA and crestor for stroke prevention - continue aricept and namenda for AD - follow up with PCP and cardiology. - follow up in 6 months
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
## 2014-09-14 ENCOUNTER — Ambulatory Visit: Payer: Medicare Other | Admitting: Neurology

## 2014-11-21 DIAGNOSIS — Z23 Encounter for immunization: Secondary | ICD-10-CM | POA: Diagnosis not present

## 2015-02-28 ENCOUNTER — Other Ambulatory Visit: Payer: Self-pay | Admitting: Interventional Cardiology

## 2015-02-28 DIAGNOSIS — E785 Hyperlipidemia, unspecified: Secondary | ICD-10-CM | POA: Diagnosis not present

## 2015-02-28 DIAGNOSIS — F0390 Unspecified dementia without behavioral disturbance: Secondary | ICD-10-CM | POA: Diagnosis not present

## 2015-02-28 DIAGNOSIS — R634 Abnormal weight loss: Secondary | ICD-10-CM | POA: Diagnosis not present

## 2015-02-28 DIAGNOSIS — R0989 Other specified symptoms and signs involving the circulatory and respiratory systems: Secondary | ICD-10-CM

## 2015-02-28 DIAGNOSIS — R109 Unspecified abdominal pain: Secondary | ICD-10-CM | POA: Diagnosis not present

## 2015-02-28 DIAGNOSIS — F331 Major depressive disorder, recurrent, moderate: Secondary | ICD-10-CM | POA: Diagnosis not present

## 2015-02-28 DIAGNOSIS — E1121 Type 2 diabetes mellitus with diabetic nephropathy: Secondary | ICD-10-CM | POA: Diagnosis not present

## 2015-02-28 DIAGNOSIS — E46 Unspecified protein-calorie malnutrition: Secondary | ICD-10-CM | POA: Diagnosis not present

## 2015-02-28 DIAGNOSIS — F419 Anxiety disorder, unspecified: Secondary | ICD-10-CM | POA: Diagnosis not present

## 2015-02-28 DIAGNOSIS — E1151 Type 2 diabetes mellitus with diabetic peripheral angiopathy without gangrene: Secondary | ICD-10-CM | POA: Diagnosis not present

## 2015-02-28 DIAGNOSIS — I129 Hypertensive chronic kidney disease with stage 1 through stage 4 chronic kidney disease, or unspecified chronic kidney disease: Secondary | ICD-10-CM | POA: Diagnosis not present

## 2015-02-28 DIAGNOSIS — N183 Chronic kidney disease, stage 3 (moderate): Secondary | ICD-10-CM | POA: Diagnosis not present

## 2015-02-28 DIAGNOSIS — L602 Onychogryphosis: Secondary | ICD-10-CM | POA: Diagnosis not present

## 2015-03-08 ENCOUNTER — Ambulatory Visit (INDEPENDENT_AMBULATORY_CARE_PROVIDER_SITE_OTHER): Payer: Medicare Other | Admitting: Neurology

## 2015-03-08 ENCOUNTER — Encounter: Payer: Self-pay | Admitting: Neurology

## 2015-03-08 VITALS — BP 135/67 | HR 71 | Wt 121.6 lb

## 2015-03-08 DIAGNOSIS — I6523 Occlusion and stenosis of bilateral carotid arteries: Secondary | ICD-10-CM | POA: Diagnosis not present

## 2015-03-08 DIAGNOSIS — I482 Chronic atrial fibrillation, unspecified: Secondary | ICD-10-CM

## 2015-03-08 DIAGNOSIS — Z950 Presence of cardiac pacemaker: Secondary | ICD-10-CM | POA: Diagnosis not present

## 2015-03-08 DIAGNOSIS — E785 Hyperlipidemia, unspecified: Secondary | ICD-10-CM | POA: Diagnosis not present

## 2015-03-08 DIAGNOSIS — I10 Essential (primary) hypertension: Secondary | ICD-10-CM | POA: Diagnosis not present

## 2015-03-08 DIAGNOSIS — G301 Alzheimer's disease with late onset: Secondary | ICD-10-CM | POA: Diagnosis not present

## 2015-03-08 DIAGNOSIS — I25810 Atherosclerosis of coronary artery bypass graft(s) without angina pectoris: Secondary | ICD-10-CM

## 2015-03-08 DIAGNOSIS — F0281 Dementia in other diseases classified elsewhere with behavioral disturbance: Secondary | ICD-10-CM | POA: Diagnosis not present

## 2015-03-08 MED ORDER — QUETIAPINE FUMARATE 25 MG PO TABS: 12.5000 mg | ORAL_TABLET | Freq: Every morning | ORAL | Status: DC

## 2015-03-08 NOTE — Patient Instructions (Addendum)
-   continue ASA and crestor for stroke prevention. - continue aricept and namenda for AD. - follow up with PCP and cardiology. - will start low dose seroquel 12.5mg  in the morning to help out daytime behavior disturbance, agitation and confusion. If too drowsy sleepy, we may have to discontinue it or lower the dose. Please let us know. - tape off ativan as it could increase the confusion, agitation. Continue once a day for 5-7 days and then off ativan. - follow up in 3 months.
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
## 2015-03-12 ENCOUNTER — Ambulatory Visit (HOSPITAL_COMMUNITY)
Admission: RE | Admit: 2015-03-12 | Discharge: 2015-03-12 | Disposition: A | Discharge status: Home / Self Care | Payer: Medicare Other | Source: Ambulatory Visit | Attending: Cardiology | Admitting: Cardiology

## 2015-03-12 DIAGNOSIS — E785 Hyperlipidemia, unspecified: Secondary | ICD-10-CM | POA: Insufficient documentation

## 2015-03-12 DIAGNOSIS — R0989 Other specified symptoms and signs involving the circulatory and respiratory systems: Secondary | ICD-10-CM | POA: Diagnosis not present

## 2015-03-12 DIAGNOSIS — I10 Essential (primary) hypertension: Secondary | ICD-10-CM | POA: Insufficient documentation

## 2015-03-12 DIAGNOSIS — E119 Type 2 diabetes mellitus without complications: Secondary | ICD-10-CM | POA: Insufficient documentation

## 2015-03-12 DIAGNOSIS — I6523 Occlusion and stenosis of bilateral carotid arteries: Secondary | ICD-10-CM | POA: Insufficient documentation

## 2015-03-21 ENCOUNTER — Encounter: Payer: Self-pay | Admitting: Podiatry

## 2015-03-21 ENCOUNTER — Ambulatory Visit (INDEPENDENT_AMBULATORY_CARE_PROVIDER_SITE_OTHER): Payer: Medicare Other | Admitting: Podiatry

## 2015-03-21 VITALS — BP 123/60 | HR 70 | Resp 12

## 2015-03-21 DIAGNOSIS — L608 Other nail disorders: Secondary | ICD-10-CM

## 2015-03-21 DIAGNOSIS — I6523 Occlusion and stenosis of bilateral carotid arteries: Secondary | ICD-10-CM | POA: Diagnosis not present

## 2015-03-21 DIAGNOSIS — E119 Type 2 diabetes mellitus without complications: Secondary | ICD-10-CM | POA: Diagnosis not present

## 2015-03-21 DIAGNOSIS — L609 Nail disorder, unspecified: Secondary | ICD-10-CM | POA: Diagnosis not present

## 2015-03-21 NOTE — Patient Instructions (Signed)
Okay to had nails trimmed the pedicurist Do not place feet in whirlpool Do not manipulate the cuticles  Diabetes and Foot Care Diabetes may cause you to have problems because of poor blood supply (circulation) to your feet and legs. This may cause the skin on your feet to become thinner, break easier, and heal more slowly. Your skin may become dry, and the skin may peel and crack. You may also have nerve damage in your legs and feet causing decreased feeling in them. You may not notice minor injuries to your feet that could lead to infections or more serious problems. Taking care of your feet is one of the most important things you can do for yourself.  HOME CARE INSTRUCTIONS  Wear shoes at all times, even in the house. Do not go barefoot. Bare feet are easily injured.  Check your feet daily for blisters, cuts, and redness. If you cannot see the bottom of your feet, use a mirror or ask someone for help.  Wash your feet with warm water (do not use hot water) and mild soap. Then pat your feet and the areas between your toes until they are completely dry. Do not soak your feet as this can dry your skin.  Apply a moisturizing lotion or petroleum jelly (that does not contain alcohol and is unscented) to the skin on your feet and to dry, brittle toenails. Do not apply lotion between your toes.  Trim your toenails straight across. Do not dig under them or around the cuticle. File the edges of your nails with an emery board or nail file.  Do not cut corns or calluses or try to remove them with medicine.  Wear clean socks or stockings every day. Make sure they are not too tight. Do not wear knee-high stockings since they may decrease blood flow to your legs.  Wear shoes that fit properly and have enough cushioning. To break in new shoes, wear them for just a few hours a day. This prevents you from injuring your feet. Always look in your shoes before you put them on to be sure there are no objects  inside.  Do not cross your legs. This may decrease the blood flow to your feet.  If you find a minor scrape, cut, or break in the skin on your feet, keep it and the skin around it clean and dry. These areas may be cleansed with mild soap and water. Do not cleanse the area with peroxide, alcohol, or iodine.  When you remove an adhesive bandage, be sure not to damage the skin around it.  If you have a wound, look at it several times a day to make sure it is healing.  Do not use heating pads or hot water bottles. They may burn your skin. If you have lost feeling in your feet or legs, you may not know it is happening until it is too late.  Make sure your health care provider performs a complete foot exam at least annually or more often if you have foot problems. Report any cuts, sores, or bruises to your health care provider immediately. SEEK MEDICAL CARE IF:   You have an injury that is not healing.  You have cuts or breaks in the skin.  You have an ingrown nail.  You notice redness on your legs or feet.  You feel burning or tingling in your legs or feet.  You have pain or cramps in your legs and feet.  Your legs or feet  are numb.  Your feet always feel cold. SEEK IMMEDIATE MEDICAL CARE IF:   There is increasing redness, swelling, or pain in or around a wound.  There is a red line that goes up your leg.  Pus is coming from a wound.  You develop a fever or as directed by your health care provider.  You notice a bad smell coming from an ulcer or wound.   This information is not intended to replace advice given to you by your health care provider. Make sure you discuss any questions you have with your health care provider.   Document Released: 01/11/2000 Document Revised: 09/15/2012 Document Reviewed: 06/22/2012 Elsevier Interactive Patient Education [employer].

## 2015-03-21 NOTE — Progress Notes (Signed)
   Subjective:    Patient ID: Valerie Carr, female    DOB: 1929-06-28, 82 y.o.   MRN: [id number]  HPI   This patient presents today with her daughter present in the treatment room. Her daughter is concerned about the toenails are elongated and slightly thickened over the past several years. Patient or daughter have been trimming the toenails up to this point, however, daughter is reluctant to trim toenails at this time. Patient and daughter deny any history of foot ulceration, claudication or amputation. Patient is a known diabetic multiple years  Review of Systems  Genitourinary: Positive for frequency.  Skin: Positive for color change.  Hematological: Bruises/bleeds easily.  Psychiatric/Behavioral: Positive for confusion.       Objective:   Physical Exam  Patient answers questions however is delayed in responding with assistance of her daughter. Patient seems to be orientated  Vascular: No peripheral edema noted bilaterally DP and PT pulses 1/4 bilaterally Capillary reflex immediate bilaterally  Musculoskeletal skeletal: HAV deformities bilaterally Bunionette's bilaterally There is no restriction ankle, subtalar, midtarsal joints bilaterally  Neurological: Sensation to 10 g monofilament wire intact 5/5 bilaterally Vibratory sensation reactive bilaterally Ankle reflex equal and reactive bilaterally  Dermatological: The toenails elongated, incurvated with occasional color changes 6-10 No open skin lesions bilaterally No corns/calluses bilaterally      Assessment & Plan:   Assessment: Satisfactory neurovascular status Diabetic without foot complications Incurvated toenails 6-10  Plan: Toenails 6-10 are debrided mechanically and electronically without any bleeding. Advised patient and daughter that could present to pedicures for trimming with the following exception: No whirlpool or manipulation of the cuticles  Reappoint when necessary at patient's request or  for yearly diabetic foot exam

## 2015-04-02 ENCOUNTER — Other Ambulatory Visit: Payer: Self-pay | Admitting: *Deleted

## 2015-04-02 NOTE — Patient Outreach (Signed)
[facility] Care Management  04/02/2015  Valerie Carr J Langston 29-Nov-1929 [id number]  Unsuccessful telephone outreach to patient's daughter, Ruthine DoseKathy Welcome, to schedule home visit.  [name]'s voicemail box was full and I was unable to leave VM msg; will re-attempt call tomorrow.  Caryl PinaLaine Mckinney Tousey, RN, BSN, [employer] Community Care Coordinator [facility] Care Management  [phone]

## 2015-04-03 ENCOUNTER — Other Ambulatory Visit: Payer: Self-pay | Admitting: *Deleted

## 2015-04-03 NOTE — Patient Outreach (Signed)
[facility] Care Management  04/03/2015  Valerie Carr [birth year] [id number]  Successful telephone outreach to [name]'s daughter, Valerie Carr, after community outreach event/ referral.  Although [name] had previously signed consent for [facility] involvement for her mother, she shared with me this afternoon that she "had changed her mind."  Attempt made to re-engage [name], who said only, "it just isn't the right time."  [name] was appreciative of the call and our efforts, and I let her know that should she re-consider and wish to have us involved in her mother's care coordination, to please call me or [facility] office number.  I made sure [name] had my contact information.  Caryl PinaLaine Mckinney Tousey, RN, BSN, [employer] Community Care Coordinator [facility] Care Management  [phone]

## 2015-04-04 ENCOUNTER — Encounter: Payer: Self-pay | Admitting: *Deleted

## 2015-04-04 NOTE — Patient Outreach (Signed)
[facility] Care Management  04/04/2015  Valerie Carr [birth year] [id number]  Case closure letter routed to Lesle Reekamita Rhodie, [facility] CMA for mailing  Caryl PinaLaine Mckinney Kristina Mcnorton, RN, BSN, CCRN [employer] [facility] Care Coordinator [facility] Care Management  [phone]

## 2015-06-13 ENCOUNTER — Ambulatory Visit: Payer: Medicare Other | Admitting: Neurology

## 2015-06-27 ENCOUNTER — Other Ambulatory Visit: Payer: Self-pay | Admitting: Adult Health

## 2015-06-28 ENCOUNTER — Encounter: Payer: Self-pay | Admitting: Neurology

## 2015-06-28 ENCOUNTER — Ambulatory Visit (INDEPENDENT_AMBULATORY_CARE_PROVIDER_SITE_OTHER): Payer: Medicare Other | Admitting: Neurology

## 2015-06-28 VITALS — BP 166/76 | HR 71 | Ht 66.0 in | Wt 125.6 lb

## 2015-06-28 DIAGNOSIS — I10 Essential (primary) hypertension: Secondary | ICD-10-CM | POA: Diagnosis not present

## 2015-06-28 DIAGNOSIS — Z950 Presence of cardiac pacemaker: Secondary | ICD-10-CM | POA: Diagnosis not present

## 2015-06-28 DIAGNOSIS — G301 Alzheimer's disease with late onset: Secondary | ICD-10-CM

## 2015-06-28 DIAGNOSIS — E785 Hyperlipidemia, unspecified: Secondary | ICD-10-CM | POA: Diagnosis not present

## 2015-06-28 DIAGNOSIS — I25810 Atherosclerosis of coronary artery bypass graft(s) without angina pectoris: Secondary | ICD-10-CM

## 2015-06-28 DIAGNOSIS — I6523 Occlusion and stenosis of bilateral carotid arteries: Secondary | ICD-10-CM

## 2015-06-28 DIAGNOSIS — I482 Chronic atrial fibrillation, unspecified: Secondary | ICD-10-CM

## 2015-06-28 DIAGNOSIS — F0281 Dementia in other diseases classified elsewhere with behavioral disturbance: Secondary | ICD-10-CM

## 2015-06-28 MED ORDER — QUETIAPINE FUMARATE 25 MG PO TABS: 25.0000 mg | ORAL_TABLET | Freq: Every morning | ORAL | Status: DC

## 2015-06-28 NOTE — Patient Instructions (Addendum)
-   continue ASA and crestor for stroke prevention. - continue aricept and namenda for AD. - follow up with PCP and cardiology. - will increase seroquel to one tab 25mg  in the morning for daytime behavior disturbance, agitation and confusion. - check BP at home and record - follow up in 4 months.
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
## 2015-07-02 ENCOUNTER — Other Ambulatory Visit: Payer: Self-pay | Admitting: Neurology

## 2015-10-15 DIAGNOSIS — E785 Hyperlipidemia, unspecified: Secondary | ICD-10-CM | POA: Diagnosis not present

## 2015-10-15 DIAGNOSIS — E1151 Type 2 diabetes mellitus with diabetic peripheral angiopathy without gangrene: Secondary | ICD-10-CM | POA: Diagnosis not present

## 2015-10-15 DIAGNOSIS — R1013 Epigastric pain: Secondary | ICD-10-CM | POA: Diagnosis not present

## 2015-10-15 DIAGNOSIS — F0390 Unspecified dementia without behavioral disturbance: Secondary | ICD-10-CM | POA: Diagnosis not present

## 2015-10-15 DIAGNOSIS — F419 Anxiety disorder, unspecified: Secondary | ICD-10-CM | POA: Diagnosis not present

## 2015-10-15 DIAGNOSIS — F331 Major depressive disorder, recurrent, moderate: Secondary | ICD-10-CM | POA: Diagnosis not present

## 2015-10-15 DIAGNOSIS — M25512 Pain in left shoulder: Secondary | ICD-10-CM | POA: Diagnosis not present

## 2015-10-15 DIAGNOSIS — N183 Chronic kidney disease, stage 3 (moderate): Secondary | ICD-10-CM | POA: Diagnosis not present

## 2015-10-15 DIAGNOSIS — E1121 Type 2 diabetes mellitus with diabetic nephropathy: Secondary | ICD-10-CM | POA: Diagnosis not present

## 2015-10-15 DIAGNOSIS — I129 Hypertensive chronic kidney disease with stage 1 through stage 4 chronic kidney disease, or unspecified chronic kidney disease: Secondary | ICD-10-CM | POA: Diagnosis not present

## 2015-10-30 DIAGNOSIS — Z23 Encounter for immunization: Secondary | ICD-10-CM | POA: Diagnosis not present

## 2015-11-01 ENCOUNTER — Ambulatory Visit (INDEPENDENT_AMBULATORY_CARE_PROVIDER_SITE_OTHER): Payer: Medicare Other | Admitting: Neurology

## 2015-11-01 ENCOUNTER — Encounter: Payer: Self-pay | Admitting: Neurology

## 2015-11-01 VITALS — BP 141/74 | HR 69 | Wt 138.2 lb

## 2015-11-01 DIAGNOSIS — I25810 Atherosclerosis of coronary artery bypass graft(s) without angina pectoris: Secondary | ICD-10-CM | POA: Diagnosis not present

## 2015-11-01 DIAGNOSIS — I6523 Occlusion and stenosis of bilateral carotid arteries: Secondary | ICD-10-CM | POA: Diagnosis not present

## 2015-11-01 DIAGNOSIS — G301 Alzheimer's disease with late onset: Secondary | ICD-10-CM

## 2015-11-01 DIAGNOSIS — I482 Chronic atrial fibrillation, unspecified: Secondary | ICD-10-CM

## 2015-11-01 DIAGNOSIS — F02818 Alzheimer's disease with late onset: Secondary | ICD-10-CM

## 2015-11-01 DIAGNOSIS — I10 Essential (primary) hypertension: Secondary | ICD-10-CM

## 2015-11-01 DIAGNOSIS — F0281 Dementia in other diseases classified elsewhere with behavioral disturbance: Secondary | ICD-10-CM | POA: Diagnosis not present

## 2015-11-01 NOTE — Patient Instructions (Addendum)
-   continue ASA and crestor for stroke prevention. - continue aricept and namenda for AD. - follow up with PCP and cardiology. - continue seroquel daily. - check BP and glucose at home and record - follow up in 6 months.
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
## 2015-11-08 ENCOUNTER — Other Ambulatory Visit: Payer: Self-pay

## 2015-11-08 DIAGNOSIS — F0281 Dementia in other diseases classified elsewhere with behavioral disturbance: Secondary | ICD-10-CM

## 2015-11-08 DIAGNOSIS — G301 Alzheimer's disease with late onset: Principal | ICD-10-CM

## 2015-11-08 MED ORDER — QUETIAPINE FUMARATE 25 MG PO TABS: 25.0000 mg | ORAL_TABLET | Freq: Every morning | ORAL | 2 refills | Status: DC

## 2016-04-14 DIAGNOSIS — R1084 Generalized abdominal pain: Secondary | ICD-10-CM | POA: Diagnosis not present

## 2016-04-14 DIAGNOSIS — F0390 Unspecified dementia without behavioral disturbance: Secondary | ICD-10-CM | POA: Diagnosis not present

## 2016-04-14 DIAGNOSIS — F331 Major depressive disorder, recurrent, moderate: Secondary | ICD-10-CM | POA: Diagnosis not present

## 2016-04-14 DIAGNOSIS — Z7984 Long term (current) use of oral hypoglycemic drugs: Secondary | ICD-10-CM | POA: Diagnosis not present

## 2016-04-14 DIAGNOSIS — I129 Hypertensive chronic kidney disease with stage 1 through stage 4 chronic kidney disease, or unspecified chronic kidney disease: Secondary | ICD-10-CM | POA: Diagnosis not present

## 2016-04-14 DIAGNOSIS — M25512 Pain in left shoulder: Secondary | ICD-10-CM | POA: Diagnosis not present

## 2016-04-14 DIAGNOSIS — E1121 Type 2 diabetes mellitus with diabetic nephropathy: Secondary | ICD-10-CM | POA: Diagnosis not present

## 2016-04-14 DIAGNOSIS — E1151 Type 2 diabetes mellitus with diabetic peripheral angiopathy without gangrene: Secondary | ICD-10-CM | POA: Diagnosis not present

## 2016-04-14 DIAGNOSIS — E440 Moderate protein-calorie malnutrition: Secondary | ICD-10-CM | POA: Diagnosis not present

## 2016-04-14 DIAGNOSIS — N183 Chronic kidney disease, stage 3 (moderate): Secondary | ICD-10-CM | POA: Diagnosis not present

## 2016-04-14 DIAGNOSIS — E785 Hyperlipidemia, unspecified: Secondary | ICD-10-CM | POA: Diagnosis not present

## 2016-04-16 ENCOUNTER — Encounter (HOSPITAL_COMMUNITY): Payer: Self-pay

## 2016-04-16 ENCOUNTER — Emergency Department (HOSPITAL_COMMUNITY)
Admission: EM | Admit: 2016-04-16 | Discharge: 2016-04-16 | Disposition: A | Discharge status: Home / Self Care | Payer: Medicare Other | Source: Home / Self Care | Attending: Emergency Medicine | Admitting: Emergency Medicine

## 2016-04-16 ENCOUNTER — Other Ambulatory Visit: Payer: Self-pay | Admitting: Interventional Cardiology

## 2016-04-16 DIAGNOSIS — R51 Headache: Secondary | ICD-10-CM | POA: Insufficient documentation

## 2016-04-16 DIAGNOSIS — E119 Type 2 diabetes mellitus without complications: Secondary | ICD-10-CM | POA: Insufficient documentation

## 2016-04-16 DIAGNOSIS — R42 Dizziness and giddiness: Secondary | ICD-10-CM | POA: Diagnosis not present

## 2016-04-16 DIAGNOSIS — Z4682 Encounter for fitting and adjustment of non-vascular catheter: Secondary | ICD-10-CM | POA: Diagnosis not present

## 2016-04-16 DIAGNOSIS — Z7982 Long term (current) use of aspirin: Secondary | ICD-10-CM | POA: Insufficient documentation

## 2016-04-16 DIAGNOSIS — K8020 Calculus of gallbladder without cholecystitis without obstruction: Secondary | ICD-10-CM | POA: Diagnosis not present

## 2016-04-16 DIAGNOSIS — K565 Intestinal adhesions [bands], unspecified as to partial versus complete obstruction: Secondary | ICD-10-CM | POA: Diagnosis not present

## 2016-04-16 DIAGNOSIS — Z7984 Long term (current) use of oral hypoglycemic drugs: Secondary | ICD-10-CM

## 2016-04-16 DIAGNOSIS — Z950 Presence of cardiac pacemaker: Secondary | ICD-10-CM | POA: Insufficient documentation

## 2016-04-16 DIAGNOSIS — K55029 Acute infarction of small intestine, extent unspecified: Secondary | ICD-10-CM | POA: Diagnosis not present

## 2016-04-16 DIAGNOSIS — I10 Essential (primary) hypertension: Secondary | ICD-10-CM | POA: Diagnosis not present

## 2016-04-16 DIAGNOSIS — I2510 Atherosclerotic heart disease of native coronary artery without angina pectoris: Secondary | ICD-10-CM | POA: Insufficient documentation

## 2016-04-16 DIAGNOSIS — R1010 Upper abdominal pain, unspecified: Secondary | ICD-10-CM | POA: Diagnosis not present

## 2016-04-16 DIAGNOSIS — Z951 Presence of aortocoronary bypass graft: Secondary | ICD-10-CM | POA: Insufficient documentation

## 2016-04-16 DIAGNOSIS — I6523 Occlusion and stenosis of bilateral carotid arteries: Secondary | ICD-10-CM

## 2016-04-16 DIAGNOSIS — R402441 Other coma, without documented Glasgow coma scale score, or with partial score reported, in the field [EMT or ambulance]: Secondary | ICD-10-CM | POA: Diagnosis not present

## 2016-04-16 DIAGNOSIS — R188 Other ascites: Secondary | ICD-10-CM | POA: Diagnosis not present

## 2016-04-16 DIAGNOSIS — Z87891 Personal history of nicotine dependence: Secondary | ICD-10-CM | POA: Insufficient documentation

## 2016-04-16 DIAGNOSIS — N179 Acute kidney failure, unspecified: Secondary | ICD-10-CM | POA: Diagnosis not present

## 2016-04-16 DIAGNOSIS — G309 Alzheimer's disease, unspecified: Secondary | ICD-10-CM

## 2016-04-16 DIAGNOSIS — K56699 Other intestinal obstruction unspecified as to partial versus complete obstruction: Secondary | ICD-10-CM | POA: Diagnosis not present

## 2016-04-16 DIAGNOSIS — D62 Acute posthemorrhagic anemia: Secondary | ICD-10-CM | POA: Diagnosis not present

## 2016-04-16 DIAGNOSIS — E43 Unspecified severe protein-calorie malnutrition: Secondary | ICD-10-CM | POA: Diagnosis not present

## 2016-04-16 LAB — I-STAT CHEM 8, ED
BUN: 30 mg/dL — AB (ref 6–20)
CALCIUM ION: 1.2 mmol/L (ref 1.15–1.40)
Chloride: 102 mmol/L (ref 101–111)
Creatinine, Ser: 1.4 mg/dL — ABNORMAL HIGH (ref 0.44–1.00)
Glucose, Bld: 117 mg/dL — ABNORMAL HIGH (ref 65–99)
HEMATOCRIT: 38 % (ref 36.0–46.0)
Hemoglobin: 12.9 g/dL (ref 12.0–15.0)
Potassium: 3.6 mmol/L (ref 3.5–5.1)
Sodium: 141 mmol/L (ref 135–145)
TCO2: 26 mmol/L (ref 0–100)

## 2016-04-16 LAB — CBG MONITORING, ED: Glucose-Capillary: 113 mg/dL — ABNORMAL HIGH (ref 65–99)

## 2016-04-16 NOTE — ED Provider Notes (Signed)
[facility]Y DEPT Provider Note   CSN: [id number] Arrival date & time: 04/16/16  1338     History   Chief Complaint Chief Complaint  Patient presents with  . Altered Mental Status    HPI Valerie Carr is a 83 y.o. female with pertinent pmh of CAD s/p CABG and pacemaker, atrial fibrillation s/p MAZE procedure (not candidate for anticoagulation, on daily aspirin), HLP, HTN, Alzheimer's with dementia is [facility].  History obtained from patient and her daughter, at bedside.  Patient states [facility] when she felt light-headed.  Patient did not remember if she fell or not.  Patient's daughter states [facility] [place] [facility] and [facility].  [facility] staff [facility] patient and helps her walk around the store and back to her house.  Patient's daughter does not know much history because she wasn't there but states that EMS brought patient here "to make sure she wasn't having a stroke".    Of note, I called [facility] who made the 911 call.  He tells me that [facility] today and he was walking her home when she said her "legs felt weak" and it seems like her legs were given out on her. Patient also reported light-headedness but did not fall, LOC or complained of CP or difficulty breathing.   HPI  Past Medical History:  Diagnosis Date  . Arteriosclerotic cardiovascular disease (ASCVD)   . Chronic anxiety    and depression  . Dementia   . DM type 2 (diabetes mellitus, type 2) (HCC)   . Gallstones    asysmptomatic  . HTN (hypertension)   . Hyperlipidemia   . Memory loss   . Sick sinus syndrome [facility](HCC)    s/p PPM    Patient Active Problem List   Diagnosis Date Noted  . Chronic atrial fibrillation (HCC) 05/31/2014  . Cardiac pacemaker in situ 05/31/2014  . Coronary artery disease involving coronary bypass graft of native heart without angina pectoris 05/31/2014  . HLD (hyperlipidemia) 05/31/2014  . Essential hypertension 05/31/2014  . Carotid artery stenosis,  asymptomatic 05/31/2014  . Sick sinus syndrome 04/18/2013  . Coronary atherosclerosis of native coronary artery 01/11/2013  . Essential hypertension, benign 01/11/2013  . Mixed hyperlipidemia 01/11/2013  . Alzheimer's disease 09/02/2012  . Memory loss 09/02/2012  . Bradycardia 01/06/2011    Past Surgical History:  Procedure Laterality Date  . APPENDECTOMY    . CORONARY ARTERY BYPASS GRAFT     07-11-2005, triple bypass  . ESOPHAGOGASTRODUODENOSCOPY    . PACEMAKER INSERTION  2004   [employer] PPM implanted by Dr Amil AmenEdmunds, generator change by Dr Johney FrameAllred 01/07/11  . PERMANENT PACEMAKER GENERATOR CHANGE N/A 01/07/2011   Procedure: PERMANENT PACEMAKER GENERATOR CHANGE;  Surgeon: Hillis RangeJames Allred, MD;  [facility];  Service: Cardiovascular;  Laterality: N/A;  . TONSILLECTOMY    . TOTAL ABDOMINAL HYSTERECTOMY W/ BILATERAL SALPINGOOPHORECTOMY      OB History    No data available       Home Medications    Prior to Admission medications   Medication Sig Start Date End Date Taking? Authorizing Provider  aspirin 325 MG tablet Take 325 mg by mouth daily.    Historical Provider, MD  donepezil (ARICEPT) 10 MG tablet TAKE 1 TABLET (10 MG TOTAL) BY MOUTH AT BEDTIME. 06/27/15   Marvel PlanJindong Xu, MD  DULoxetine (CYMBALTA) 60 MG capsule Take 60 mg by mouth 2 (two) times daily.     Historical Provider, MD  memantine (NAMENDA) 10 MG tablet Take 1 tablet (10 mg total) by mouth 2 (two) times daily. 01/11/13   Ronal Fear, NP  metFORMIN (GLUCOPHAGE-XR) 500 MG 24 hr tablet Take 500 mg by mouth daily with breakfast.      Historical Provider, MD  omeprazole (PRILOSEC) 40 MG capsule Take 40 mg by mouth daily. 02/28/15   Historical Provider, MD  ONE TOUCH ULTRA TEST test strip TEST DAILY DX E11.51 06/19/14   Historical Provider, MD  QUEtiapine (SEROQUEL) 25 MG tablet Take 1 tablet (25 mg total) by mouth every morning. 11/08/15   Marvel Plan, MD  SYNJARDY XR 10-998 MG TB24  10/17/15   Historical Provider, MD   traZODone (DESYREL) 100 MG tablet Take 100 mg by mouth at bedtime.      Historical Provider, MD  valsartan (DIOVAN) 80 MG tablet Take 80 mg by mouth daily. 02/28/15   Historical Provider, MD    Family History Family History  Problem Relation Age of Onset  . Diabetes Mother   . Hypertension Mother   . Coronary artery disease Father   . Diabetes Brother   . Throat cancer Brother   . Diabetes Brother   . Hypertension Sister   . Cancer Son     esophageal    Social History Social History  Substance Use Topics  . Smoking status: Former Smoker    Packs/day: 1.00    Years: 25.00  . Smokeless tobacco: Former Neurosurgeon     Comment: quit in 2001  . Alcohol use No     Allergies   Clarithromycin; Codeine phosphate; Effexor [venlafaxine hydrochloride]; Fexofenadine hcl; Lisinopril; Prozac [fluoxetine hcl]; Septra [bactrim]; and Wellbutrin [bupropion hcl]   Review of Systems Review of Systems  Constitutional: Negative for appetite change, chills and fever.  Eyes: Negative for visual disturbance.  Respiratory: Negative for cough, chest tightness and shortness of breath.   Cardiovascular: Negative for chest pain and palpitations.  Gastrointestinal: Negative for abdominal pain, constipation, diarrhea, nausea and vomiting.  Genitourinary: Negative for difficulty urinating, dysuria and hematuria.  Musculoskeletal: Negative for arthralgias and gait problem.  Skin: Negative for wound.  Neurological: Positive for light-headedness. Negative for dizziness, syncope, speech difficulty, weakness, numbness and headaches.  Hematological: Does not bruise/bleed easily.  Psychiatric/Behavioral: Positive for confusion (baseline dementia).     Physical Exam Updated Vital Signs BP 127/62 (BP Location: Left Arm)   Pulse 70   Temp 97.9 F (36.6 C) (Oral)   Resp 16   SpO2 97%   Physical Exam  Constitutional: She is oriented to person, place, and time. Vital signs are normal. She appears  well-developed and well-nourished. No distress.  Alert and oriented to self, place, time.  HENT:  Head: Normocephalic and atraumatic. Head is without contusion.  Nose: Nose normal.  Mouth/Throat: Uvula is midline, oropharynx is clear and moist and mucous membranes are normal. She has dentures. No oropharyngeal exudate.  Eyes: Conjunctivae, EOM and lids are normal. Pupils are equal, round, and reactive to light. Right eye exhibits no nystagmus. Left eye exhibits no nystagmus.  Neck: Normal range of motion and full passive range of motion without pain. Neck supple. No spinous process tenderness present. No neck rigidity. Normal range of motion present.  Cardiovascular: Normal rate, regular rhythm, S1 normal, S2 normal, normal heart sounds and intact distal pulses.  Exam reveals no friction rub.   No murmur heard. Pulses:      Carotid pulses are 2+ on the right side, and 2+ on the left  side.      Radial pulses are 2+ on the right side, and 2+ on the left side.       Dorsalis pedis pulses are 2+ on the right side, and 2+ on the left side.  Pulmonary/Chest: Effort normal and breath sounds normal. No respiratory distress. She has no decreased breath sounds. She has no wheezes. She has no rales. She exhibits no tenderness.  Abdominal: Soft. Bowel sounds are normal. She exhibits no distension and no mass. There is no tenderness. There is no rigidity.  Musculoskeletal: Normal range of motion. She exhibits no deformity.  Patient ambulates with normal gait. Full active ROM of UE and LE. Patient danced for me.  Lymphadenopathy:    She has no cervical adenopathy.  Neurological: She is alert and oriented to person, place, and time. No sensory deficit.  Pt is alert and oriented.   Speech and phonation normal.   Thought process coherent, pleasantly confused. Strength 5/5 in upper and lower extremities.   Sensation to light touch intact in upper and lower extremities.  Gait normal.   Negative Romberg. No  leg drift.  Intact finger to nose test. CN I not tested CN II full visual fields  CN III, IV, VI PEERL and EOM intact CN V light touch intact in all 3 divisions of trigeminal nerve CN VII facial nerve movements intact, symmetric CN VIII hearing intact to finger rub CN IX, X no uvula deviation, symmetric soft palate rise CN XI 5/5 SCM and trapezius strength  CN XII Tongue midline with symmetric L/R movement  Skin: Skin is warm and dry. Capillary refill takes less than 2 seconds.  Psychiatric: She has a normal mood and affect. Her speech is normal and behavior is normal. Judgment and thought content normal. She exhibits abnormal recent memory.  Nursing note and vitals reviewed.    ED Treatments / Results  Labs (all labs ordered are listed, but only abnormal results are displayed) Labs Reviewed  CBG MONITORING, ED - Abnormal; Notable for the following:       Result Value   Glucose-Capillary 113 (*)    All other components within normal limits  I-STAT CHEM 8, ED - Abnormal; Notable for the following:    BUN 30 (*)    Creatinine, Ser 1.40 (*)    Glucose, Bld 117 (*)    All other components within normal limits    EKG  EKG Interpretation  Date/Time:  Wednesday April 16 2016 13:57:25 EDT Ventricular Rate:  70 PR Interval:    QRS Duration: 92 QT Interval:  432 QTC Calculation: 466 R Axis:   49 Text Interpretation:  electronically paced rhythm Incomplete right bundle branch block Nonspecific ST abnormality Abnormal ECG Confirmed by Ethelda Chick  MD, SAM ([id number]) on 04/16/2016 2:15:57 PM       Radiology No results found.  Procedures Procedures (including critical care time)  Medications Ordered in ED Medications - No data to display   Initial Impression / Assessment and Plan / ED Course  I have reviewed the triage vital signs and the nursing notes.  Pertinent labs & imaging results that were available during my care of the patient were reviewed by me and considered in my  medical decision making (see chart for details).  Clinical Course as of Apr 17 1924  Wed Apr 16, 2016  1604 Hemoglobin: 12.9 [CG]  1604 Sodium: 141 [CG]  1604 Potassium: 3.6 [CG]  1604 Chloride: 102 [CG]  1604 Glucose-Capillary: (!) 113 [CG]  1604  Creatinine: (!) 1.40 [CG]  1605 electronically paced rhythm Incomplete right bundle branch block Nonspecific ST abnormality Abnormal ECG EKG 12-Lead [CG]  1605 Temp: 97.9 F (36.6 C) [CG]  1605 Pulse Rate: 70 [CG]  1605 BP: 127/62 [CG]  1605 Resp: 16 [CG]  1605 SpO2: 97 % [CG]    Clinical Course User Index [CG] Liberty Handy, PA-C   On exam patient is eager to go home.  She has no complaints, denies CP, SOB, light-headedness or pain anywhere.  She does not fully remember events that brought her to ED but this appears to be her baseline dementia/Alzheimer's.  Patient's daughter at bedside is frustrated, does not know why patient was brought here.  States patient is acting at baseline. Given episode of [facility]ness reported [facility], will get basic labs.  Physical exam benign, no neuro deficits, patient danced for me when I asked her to walk to assess balance and gait.   CBG, chem 8, EKG reassuring. VS within normal limits. Patient tolerated water in ED.  Has been ambulatory while waiting for labs.  Interrogated patient's pacemaker, no abnormalities.  Repeat evaluation unchanged from initial.  I think patient is safe to go home without further emergent lab work or imaging.  Patient has caretakers that are reliable, advised close monitoring.  Patient and family at bedside agreeable.   Final Clinical Impressions(s) / ED Diagnoses   Final diagnoses:  Light headedness    New Prescriptions Discharge Medication List as of 04/16/2016  4:10 PM       Liberty Handy, PA-C 04/17/16 1926    Doug Sou, MD 04/17/16 2246

## 2016-04-16 NOTE — ED Provider Notes (Signed)
[facility] Reported[facility] felt [facility] and her knees "buckled". She is presently asymptomatic and she looks her normal self according to her daughter who accompanies her. Patient is alert and in no distress moves all extremities. Ambulates without difficulty   Doug SouSam Addisen Chappelle, MD 04/16/16 1438

## 2016-04-16 NOTE — Discharge Instructions (Signed)
Your EKG, lab work, vital signs and physical exam today were normal and reassuring.  We interrogated your pacemaker and it did not have any abnormalities.    Please continue taking your medications as prescribed.  Drink plenty of water to ensure you are not dehydrated, which can cause light-headedness.    Follow up with your neurologist as scheduled.

## 2016-04-16 NOTE — ED Triage Notes (Addendum)
PER EMS: pt from home (apartment complex). Friends of pt reported that pt appeared more confused than usual and was just sitting in the lobby of the apartment building, hx of dementia. They stated they have "never seen her this confused." EMS arrived and pts BP was 90/60 and pt appeared less interactive. EMS states pt suddenly became more alert and BP increased to 112/62 without any interventions. Pt does have a pacemaker. EMS state pt is at baseline currently.

## 2016-04-18 ENCOUNTER — Inpatient Hospital Stay (HOSPITAL_COMMUNITY)
Admission: EM | Admit: 2016-04-18 | Discharge: 2016-04-25 | DRG: 335 | Disposition: A | Discharge status: Home / Self Care | Payer: Medicare Other | Attending: Internal Medicine | Admitting: Internal Medicine

## 2016-04-18 ENCOUNTER — Encounter (HOSPITAL_COMMUNITY): Payer: Self-pay | Admitting: Nurse Practitioner

## 2016-04-18 ENCOUNTER — Emergency Department (HOSPITAL_COMMUNITY): Payer: Medicare Other

## 2016-04-18 DIAGNOSIS — I6529 Occlusion and stenosis of unspecified carotid artery: Secondary | ICD-10-CM | POA: Diagnosis present

## 2016-04-18 DIAGNOSIS — K55029 Acute infarction of small intestine, extent unspecified: Secondary | ICD-10-CM | POA: Diagnosis present

## 2016-04-18 DIAGNOSIS — N183 Chronic kidney disease, stage 3 (moderate): Secondary | ICD-10-CM | POA: Diagnosis present

## 2016-04-18 DIAGNOSIS — G309 Alzheimer's disease, unspecified: Secondary | ICD-10-CM | POA: Diagnosis present

## 2016-04-18 DIAGNOSIS — Z888 Allergy status to other drugs, medicaments and biological substances status: Secondary | ICD-10-CM

## 2016-04-18 DIAGNOSIS — Z950 Presence of cardiac pacemaker: Secondary | ICD-10-CM | POA: Diagnosis not present

## 2016-04-18 DIAGNOSIS — I495 Sick sinus syndrome: Secondary | ICD-10-CM | POA: Diagnosis not present

## 2016-04-18 DIAGNOSIS — I482 Chronic atrial fibrillation, unspecified: Secondary | ICD-10-CM | POA: Diagnosis present

## 2016-04-18 DIAGNOSIS — Z9049 Acquired absence of other specified parts of digestive tract: Secondary | ICD-10-CM

## 2016-04-18 DIAGNOSIS — N179 Acute kidney failure, unspecified: Secondary | ICD-10-CM | POA: Diagnosis present

## 2016-04-18 DIAGNOSIS — R1030 Lower abdominal pain, unspecified: Secondary | ICD-10-CM | POA: Diagnosis not present

## 2016-04-18 DIAGNOSIS — I25810 Atherosclerosis of coronary artery bypass graft(s) without angina pectoris: Secondary | ICD-10-CM | POA: Diagnosis present

## 2016-04-18 DIAGNOSIS — R1010 Upper abdominal pain, unspecified: Secondary | ICD-10-CM | POA: Diagnosis not present

## 2016-04-18 DIAGNOSIS — N736 Female pelvic peritoneal adhesions (postinfective): Secondary | ICD-10-CM | POA: Diagnosis present

## 2016-04-18 DIAGNOSIS — Z4682 Encounter for fitting and adjustment of non-vascular catheter: Secondary | ICD-10-CM | POA: Diagnosis not present

## 2016-04-18 DIAGNOSIS — I10 Essential (primary) hypertension: Secondary | ICD-10-CM | POA: Diagnosis not present

## 2016-04-18 DIAGNOSIS — Z66 Do not resuscitate: Secondary | ICD-10-CM | POA: Diagnosis present

## 2016-04-18 DIAGNOSIS — R109 Unspecified abdominal pain: Secondary | ICD-10-CM

## 2016-04-18 DIAGNOSIS — G301 Alzheimer's disease with late onset: Secondary | ICD-10-CM

## 2016-04-18 DIAGNOSIS — E785 Hyperlipidemia, unspecified: Secondary | ICD-10-CM | POA: Diagnosis present

## 2016-04-18 DIAGNOSIS — K8020 Calculus of gallbladder without cholecystitis without obstruction: Secondary | ICD-10-CM | POA: Diagnosis present

## 2016-04-18 DIAGNOSIS — Z7984 Long term (current) use of oral hypoglycemic drugs: Secondary | ICD-10-CM

## 2016-04-18 DIAGNOSIS — R296 Repeated falls: Secondary | ICD-10-CM | POA: Diagnosis present

## 2016-04-18 DIAGNOSIS — K56609 Unspecified intestinal obstruction, unspecified as to partial versus complete obstruction: Secondary | ICD-10-CM | POA: Diagnosis not present

## 2016-04-18 DIAGNOSIS — F0280 Dementia in other diseases classified elsewhere without behavioral disturbance: Secondary | ICD-10-CM | POA: Diagnosis not present

## 2016-04-18 DIAGNOSIS — Z8249 Family history of ischemic heart disease and other diseases of the circulatory system: Secondary | ICD-10-CM

## 2016-04-18 DIAGNOSIS — R188 Other ascites: Secondary | ICD-10-CM | POA: Diagnosis present

## 2016-04-18 DIAGNOSIS — D62 Acute posthemorrhagic anemia: Secondary | ICD-10-CM | POA: Diagnosis present

## 2016-04-18 DIAGNOSIS — Z6823 Body mass index (BMI) 23.0-23.9, adult: Secondary | ICD-10-CM

## 2016-04-18 DIAGNOSIS — E876 Hypokalemia: Secondary | ICD-10-CM | POA: Diagnosis present

## 2016-04-18 DIAGNOSIS — Z7982 Long term (current) use of aspirin: Secondary | ICD-10-CM

## 2016-04-18 DIAGNOSIS — E1122 Type 2 diabetes mellitus with diabetic chronic kidney disease: Secondary | ICD-10-CM | POA: Diagnosis present

## 2016-04-18 DIAGNOSIS — Z885 Allergy status to narcotic agent status: Secondary | ICD-10-CM

## 2016-04-18 DIAGNOSIS — I2510 Atherosclerotic heart disease of native coronary artery without angina pectoris: Secondary | ICD-10-CM | POA: Diagnosis present

## 2016-04-18 DIAGNOSIS — I714 Abdominal aortic aneurysm, without rupture: Secondary | ICD-10-CM | POA: Diagnosis not present

## 2016-04-18 DIAGNOSIS — Z79899 Other long term (current) drug therapy: Secondary | ICD-10-CM

## 2016-04-18 DIAGNOSIS — Z87891 Personal history of nicotine dependence: Secondary | ICD-10-CM

## 2016-04-18 DIAGNOSIS — K565 Intestinal adhesions [bands], unspecified as to partial versus complete obstruction: Secondary | ICD-10-CM

## 2016-04-18 DIAGNOSIS — R627 Adult failure to thrive: Secondary | ICD-10-CM | POA: Diagnosis present

## 2016-04-18 DIAGNOSIS — G8929 Other chronic pain: Secondary | ICD-10-CM | POA: Diagnosis present

## 2016-04-18 DIAGNOSIS — Z4659 Encounter for fitting and adjustment of other gastrointestinal appliance and device: Secondary | ICD-10-CM

## 2016-04-18 DIAGNOSIS — Z951 Presence of aortocoronary bypass graft: Secondary | ICD-10-CM

## 2016-04-18 DIAGNOSIS — I129 Hypertensive chronic kidney disease with stage 1 through stage 4 chronic kidney disease, or unspecified chronic kidney disease: Secondary | ICD-10-CM | POA: Diagnosis present

## 2016-04-18 DIAGNOSIS — Z9889 Other specified postprocedural states: Secondary | ICD-10-CM

## 2016-04-18 DIAGNOSIS — K56699 Other intestinal obstruction unspecified as to partial versus complete obstruction: Secondary | ICD-10-CM | POA: Diagnosis not present

## 2016-04-18 DIAGNOSIS — D72829 Elevated white blood cell count, unspecified: Secondary | ICD-10-CM | POA: Diagnosis present

## 2016-04-18 DIAGNOSIS — Z808 Family history of malignant neoplasm of other organs or systems: Secondary | ICD-10-CM

## 2016-04-18 DIAGNOSIS — E860 Dehydration: Secondary | ICD-10-CM | POA: Diagnosis present

## 2016-04-18 DIAGNOSIS — Z90710 Acquired absence of both cervix and uterus: Secondary | ICD-10-CM

## 2016-04-18 DIAGNOSIS — E43 Unspecified severe protein-calorie malnutrition: Secondary | ICD-10-CM | POA: Diagnosis present

## 2016-04-18 DIAGNOSIS — R1084 Generalized abdominal pain: Secondary | ICD-10-CM | POA: Diagnosis not present

## 2016-04-18 DIAGNOSIS — Z833 Family history of diabetes mellitus: Secondary | ICD-10-CM

## 2016-04-18 LAB — COMPREHENSIVE METABOLIC PANEL
ALBUMIN: 4.7 g/dL (ref 3.5–5.0)
ALT: 13 U/L — ABNORMAL LOW (ref 14–54)
AST: 18 U/L (ref 15–41)
Alkaline Phosphatase: 44 U/L (ref 38–126)
Anion gap: 8 (ref 5–15)
BILIRUBIN TOTAL: 0.7 mg/dL (ref 0.3–1.2)
BUN: 25 mg/dL — AB (ref 6–20)
CO2: 26 mmol/L (ref 22–32)
Calcium: 9.4 mg/dL (ref 8.9–10.3)
Chloride: 107 mmol/L (ref 101–111)
Creatinine, Ser: 1.16 mg/dL — ABNORMAL HIGH (ref 0.44–1.00)
GFR calc Af Amer: 49 mL/min — ABNORMAL LOW (ref >60)
GFR calc non Af Amer: 42 mL/min — ABNORMAL LOW (ref >60)
GLUCOSE: 216 mg/dL — AB (ref 65–99)
POTASSIUM: 3 mmol/L — AB (ref 3.5–5.1)
SODIUM: 141 mmol/L (ref 135–145)
TOTAL PROTEIN: 8.2 g/dL — AB (ref 6.5–8.1)

## 2016-04-18 LAB — URINALYSIS, ROUTINE W REFLEX MICROSCOPIC
BILIRUBIN URINE: NEGATIVE
Bacteria, UA: NONE SEEN
Glucose, UA: >=500 mg/dL — AB
KETONES UR: 5 mg/dL — AB
LEUKOCYTES UA: NEGATIVE
Nitrite: NEGATIVE
PROTEIN: NEGATIVE mg/dL
Specific Gravity, Urine: 1.016 (ref 1.005–1.030)
pH: 6 (ref 5.0–8.0)

## 2016-04-18 LAB — LIPASE, BLOOD: Lipase: 38 U/L (ref 11–51)

## 2016-04-18 LAB — CBC WITH DIFFERENTIAL/PLATELET
Basophils Absolute: 0 10*3/uL (ref 0.0–0.1)
Basophils Relative: 0 %
EOS PCT: 1 %
Eosinophils Absolute: 0 10*3/uL (ref 0.0–0.7)
HEMATOCRIT: 36.5 % (ref 36.0–46.0)
Hemoglobin: 12.5 g/dL (ref 12.0–15.0)
LYMPHS ABS: 0.6 10*3/uL — AB (ref 0.7–4.0)
LYMPHS PCT: 11 %
MCH: 29.3 pg (ref 26.0–34.0)
MCHC: 34.2 g/dL (ref 30.0–36.0)
MCV: 85.7 fL (ref 78.0–100.0)
MONO ABS: 0.3 10*3/uL (ref 0.1–1.0)
MONOS PCT: 5 %
Neutro Abs: 5.1 10*3/uL (ref 1.7–7.7)
Neutrophils Relative %: 83 %
PLATELETS: 178 10*3/uL (ref 150–400)
RBC: 4.26 MIL/uL (ref 3.87–5.11)
RDW: 13.3 % (ref 11.5–15.5)
WBC: 6 10*3/uL (ref 4.0–10.5)

## 2016-04-18 LAB — I-STAT TROPONIN, ED: Troponin i, poc: 0 ng/mL (ref 0.00–0.08)

## 2016-04-18 LAB — GLUCOSE, CAPILLARY
GLUCOSE-CAPILLARY: 226 mg/dL — AB (ref 65–99)
Glucose-Capillary: 272 mg/dL — ABNORMAL HIGH (ref 65–99)

## 2016-04-18 LAB — MRSA PCR SCREENING: MRSA by PCR: NEGATIVE

## 2016-04-18 LAB — LACTIC ACID, PLASMA: Lactic Acid, Venous: 1.8 mmol/L (ref 0.5–1.9)

## 2016-04-18 LAB — SALICYLATE LEVEL

## 2016-04-18 MED ORDER — MORPHINE SULFATE (PF) 4 MG/ML IV SOLN: 2.0000 mg | INTRAVENOUS | Status: DC | PRN

## 2016-04-18 MED ORDER — HYDRALAZINE HCL 20 MG/ML IJ SOLN: 5.0000 mg | INTRAMUSCULAR | Status: DC | PRN

## 2016-04-18 MED ORDER — FAMOTIDINE IN NACL 20-0.9 MG/50ML-% IV SOLN
20.0000 mg | Freq: Once | INTRAVENOUS | Status: AC
Start: 2016-04-18 — End: 2016-04-18
  Administered 2016-04-18: 20 mg via INTRAVENOUS
  Filled 2016-04-18: qty 50

## 2016-04-18 MED ORDER — ONDANSETRON HCL 4 MG/2ML IJ SOLN
4.0000 mg | Freq: Once | INTRAMUSCULAR | Status: AC
Start: 2016-04-18 — End: 2016-04-18
  Administered 2016-04-18: 4 mg via INTRAVENOUS
  Filled 2016-04-18: qty 2

## 2016-04-18 MED ORDER — INSULIN ASPART 100 UNIT/ML ~~LOC~~ SOLN
0.0000 [IU] | Freq: Three times a day (TID) | SUBCUTANEOUS | Status: DC
  Administered 2016-04-18 – 2016-04-19 (×2): 3 [IU] via SUBCUTANEOUS
  Administered 2016-04-19: 5 [IU] via SUBCUTANEOUS
  Administered 2016-04-19 – 2016-04-20 (×2): 3 [IU] via SUBCUTANEOUS

## 2016-04-18 MED ORDER — KCL IN DEXTROSE-NACL 20-5-0.45 MEQ/L-%-% IV SOLN
INTRAVENOUS | Status: DC
  Administered 2016-04-18 – 2016-04-20 (×4): via INTRAVENOUS
  Filled 2016-04-18 (×5): qty 1000

## 2016-04-18 MED ORDER — ONDANSETRON HCL 4 MG/2ML IJ SOLN
4.0000 mg | Freq: Four times a day (QID) | INTRAMUSCULAR | Status: DC | PRN
  Administered 2016-04-19: 4 mg via INTRAVENOUS
  Filled 2016-04-18: qty 2

## 2016-04-18 MED ORDER — MORPHINE SULFATE (PF) 4 MG/ML IV SOLN
2.0000 mg | Freq: Once | INTRAVENOUS | Status: AC
Start: 2016-04-18 — End: 2016-04-18
  Administered 2016-04-18: 2 mg via INTRAVENOUS
  Filled 2016-04-18: qty 1

## 2016-04-18 MED ORDER — DULOXETINE HCL 30 MG PO CPEP
60.0000 mg | ORAL_CAPSULE | Freq: Two times a day (BID) | ORAL | Status: DC
  Filled 2016-04-18: qty 2

## 2016-04-18 MED ORDER — TRAZODONE HCL 50 MG PO TABS
100.0000 mg | ORAL_TABLET | Freq: Every day | ORAL | Status: DC
  Administered 2016-04-18: 100 mg via ORAL
  Filled 2016-04-18 (×2): qty 1

## 2016-04-18 MED ORDER — MEMANTINE HCL 5 MG PO TABS
10.0000 mg | ORAL_TABLET | Freq: Two times a day (BID) | ORAL | Status: DC
  Filled 2016-04-18: qty 1

## 2016-04-18 MED ORDER — IRBESARTAN 75 MG PO TABS: 37.5000 mg | ORAL_TABLET | Freq: Every day | ORAL | Status: DC

## 2016-04-18 MED ORDER — LACTATED RINGERS IV BOLUS (SEPSIS)
500.0000 mL | Freq: Once | INTRAVENOUS | Status: AC
  Administered 2016-04-18: 500 mL via INTRAVENOUS

## 2016-04-18 MED ORDER — METHOCARBAMOL 1000 MG/10ML IJ SOLN
500.0000 mg | Freq: Three times a day (TID) | INTRAVENOUS | Status: DC | PRN
  Administered 2016-04-19 (×2): 500 mg via INTRAVENOUS
  Filled 2016-04-18: qty 550
  Filled 2016-04-18 (×2): qty 5
  Filled 2016-04-18: qty 550
  Filled 2016-04-18: qty 5

## 2016-04-18 MED ORDER — SODIUM CHLORIDE 0.9% FLUSH
3.0000 mL | Freq: Two times a day (BID) | INTRAVENOUS | Status: DC
  Administered 2016-04-18: 3 mL via INTRAVENOUS

## 2016-04-18 MED ORDER — ORAL CARE MOUTH RINSE
15.0000 mL | Freq: Two times a day (BID) | OROMUCOSAL | Status: DC
  Administered 2016-04-19 (×2): 15 mL via OROMUCOSAL

## 2016-04-18 MED ORDER — ALPRAZOLAM 0.25 MG PO TABS: 0.2500 mg | ORAL_TABLET | Freq: Three times a day (TID) | ORAL | Status: DC | PRN

## 2016-04-18 MED ORDER — GI COCKTAIL ~~LOC~~
30.0000 mL | Freq: Once | ORAL | Status: AC
  Administered 2016-04-18: 30 mL via ORAL
  Filled 2016-04-18: qty 30

## 2016-04-18 MED ORDER — MORPHINE SULFATE (PF) 4 MG/ML IV SOLN
1.0000 mg | INTRAVENOUS | Status: DC | PRN
  Administered 2016-04-18 – 2016-04-20 (×7): 2 mg via INTRAVENOUS
  Filled 2016-04-18 (×7): qty 1

## 2016-04-18 MED ORDER — CHLORHEXIDINE GLUCONATE 0.12 % MT SOLN
15.0000 mL | Freq: Two times a day (BID) | OROMUCOSAL | Status: DC
  Administered 2016-04-18 – 2016-04-19 (×3): 15 mL via OROMUCOSAL
  Filled 2016-04-18 (×2): qty 15

## 2016-04-18 MED ORDER — DONEPEZIL HCL 10 MG PO TABS
10.0000 mg | ORAL_TABLET | Freq: Every day | ORAL | Status: DC
  Administered 2016-04-18: 10 mg via ORAL
  Filled 2016-04-18 (×2): qty 1

## 2016-04-18 MED ORDER — QUETIAPINE FUMARATE 50 MG PO TABS: 25.0000 mg | ORAL_TABLET | Freq: Every morning | ORAL | Status: DC

## 2016-04-18 MED ORDER — MORPHINE SULFATE (PF) 4 MG/ML IV SOLN
1.0000 mg | INTRAVENOUS | Status: DC | PRN
  Administered 2016-04-18: 1 mg via INTRAVENOUS
  Filled 2016-04-18: qty 1

## 2016-04-18 NOTE — ED Triage Notes (Signed)
Patient complains of lower generilized abdominal pain for the past 4 years. Family states she has gotten checked out for this same thig serveral times and nothing is found. Patient has dementia and is living own her own and is responsible for her ow medications. Patient arrived to the ER with a new onset N/V

## 2016-04-18 NOTE — H&P (Addendum)
History and Physical    Valerie Carr:[id number] DOB: 1929/10/17 DOA: 04/18/2016  I have briefly reviewed the patient's prior medical records in [facility]  PCP: Cala Bradford, MD  Patient coming from: Home  Chief Complaint: Abdominal pain, nausea vomiting  HPI: Valerie Carr is a 83 y.o. female with medical history significant of dementia, coronary artery disease status post CABG, atrial fibrillation status post Maze procedure and sick sinus syndrome status post pacemaker, hypertension, hyperlipidemia, type 2 diabetes mellitus, presents to the emergency room with chief complaint of abdominal pain, as well as nausea vomiting.  Patient has underlying dementia and there is no family at bedside so history is somewhat limited.  She basically tells me that she has been having abdominal pain for the past hour, and had one emesis episode today.  She does not know if she has had these symptoms in the past.  Currently she has no chest pain, no shortness of breath.  She denies any fever or chills.  Other than the abdominal pain, she currently feels okay.  ED Course: In the ED, patient's vital signs are stable, she had blood work done which was fairly unremarkable except for a glucose of 216 and a potassium of 3.0, her creatinine is 1.1, she underwent a CT scan of the abdomen and pelvis which showed possible small bowel obstruction as well as a AAA.  General surgery was consulted, and due to multiple medical problems [facility] was asked for admission.  Review of Systems: Unable to obtain accurate review of systems due to underlying dementia  Past Medical History:  Diagnosis Date  . Arteriosclerotic cardiovascular disease (ASCVD)   . Chronic anxiety    and depression  . Dementia   . DM type 2 (diabetes mellitus, type 2) (HCC)   . Gallstones    asysmptomatic  . HTN (hypertension)   . Hyperlipidemia   . Memory loss   . Sick sinus syndrome (HCC)    s/p PPM    Past Surgical History:    Procedure Laterality Date  . APPENDECTOMY    . CORONARY ARTERY BYPASS GRAFT     07-11-2005, triple bypass  . ESOPHAGOGASTRODUODENOSCOPY    . PACEMAKER INSERTION  2004   [employer] PPM implanted by Dr Amil Amen, generator change by Dr Johney Frame 01/07/11  . PERMANENT PACEMAKER GENERATOR CHANGE N/A 01/07/2011   Procedure: PERMANENT PACEMAKER GENERATOR CHANGE;  Surgeon: Hillis Range, MD;  Location: [facility] CATH LAB;  Service: Cardiovascular;  Laterality: N/A;  . TONSILLECTOMY    . TOTAL ABDOMINAL HYSTERECTOMY W/ BILATERAL SALPINGOOPHORECTOMY       reports that she has quit smoking. She has a 25.00 pack-year smoking history. She has quit using smokeless tobacco. She reports that she does not drink alcohol or use drugs.  Allergies  Allergen Reactions  . Clarithromycin Other (See Comments)    unknown  . Codeine Phosphate Other (See Comments)    unknown  . Effexor [Venlafaxine Hydrochloride] Other (See Comments)    unknown  . Fexofenadine Hcl Other (See Comments)    unknown  . Lisinopril Other (See Comments)    unknown  . Prozac [Fluoxetine Hcl] Other (See Comments)    unknown  . Septra [Bactrim] Other (See Comments)    unknown  . Wellbutrin [Bupropion Hcl] Other (See Comments)    unknown    Family History  Problem Relation Age of Onset  . Diabetes Mother   . Hypertension Mother   . Coronary artery disease Father   .  Diabetes Brother   . Throat cancer Brother   . Diabetes Brother   . Hypertension Sister   . Cancer Son     esophageal    Prior to Admission medications   Medication Sig Start Date End Date Taking? Authorizing Provider  aspirin 325 MG tablet Take 325 mg by mouth daily.    Historical Provider, MD  donepezil (ARICEPT) 10 MG tablet TAKE 1 TABLET (10 MG TOTAL) BY MOUTH AT BEDTIME. 06/27/15   Marvel Plan, MD  DULoxetine (CYMBALTA) 60 MG capsule Take 60 mg by mouth 2 (two) times daily.     Historical Provider, MD  memantine (NAMENDA) 10 MG tablet Take 1 tablet (10 mg  total) by mouth 2 (two) times daily. 01/11/13   Ronal Fear, NP  metFORMIN (GLUCOPHAGE-XR) 500 MG 24 hr tablet Take 500 mg by mouth daily with breakfast.      Historical Provider, MD  omeprazole (PRILOSEC) 40 MG capsule Take 40 mg by mouth daily. 02/28/15   Historical Provider, MD  ONE TOUCH ULTRA TEST test strip TEST DAILY DX E11.51 06/19/14   Historical Provider, MD  QUEtiapine (SEROQUEL) 25 MG tablet Take 1 tablet (25 mg total) by mouth every morning. 11/08/15   Marvel Plan, MD  SYNJARDY XR 10-998 MG TB24  10/17/15   Historical Provider, MD  traZODone (DESYREL) 100 MG tablet Take 100 mg by mouth at bedtime.      Historical Provider, MD  valsartan (DIOVAN) 80 MG tablet Take 80 mg by mouth daily. 02/28/15   Historical Provider, MD    Physical Exam: Vitals:   04/18/16 9147 04/18/16 0924 04/18/16 0938  BP: 130/80  (!) 166/61  Pulse: 80  70  Resp: 18  18  Temp:   97.4 F (36.3 C)  TempSrc:   Oral  SpO2: 100%  100%  Weight:  58.1 kg (128 lb)   Height:  5\' 2"  (1.575 m)       Constitutional: NAD, calm, comfortable Vitals:   04/18/16 0923 04/18/16 0924 04/18/16 0938  BP: 130/80  (!) 166/61  Pulse: 80  70  Resp: 18  18  Temp:   97.4 F (36.3 C)  TempSrc:   Oral  SpO2: 100%  100%  Weight:  58.1 kg (128 lb)   Height:  5\' 2"  (1.575 m)    Eyes: PERRL, lids and conjunctivae normal ENMT: Mucous membranes are moist. Posterior pharynx clear of any exudate or lesions.  Wears dentures Neck: normal, supple, no masses Respiratory: clear to auscultation bilaterally, no wheezing, no crackles. Normal respiratory effort. No accessory muscle use.  Cardiovascular: Regular rate and rhythm, no murmurs / rubs / gallops. No extremity edema. 2+ pedal pulses.  Abdomen: slight tenderness in the left lower quadrant, no masses palpated. Bowel sounds decreased.  Musculoskeletal: no clubbing / cyanosis. Normal muscle tone.  Skin: no rashes, lesions, ulcers. No induration Neurologic: CN 2-12 grossly intact.  Strength 5/5 in all 4.  Psychiatric: Normal judgment and insight. Alert and oriented x person. Normal mood.   Labs on Admission: I have personally reviewed following labs and imaging studies  CBC:  Recent Labs Lab 04/16/16 1541 04/18/16 1011  WBC  --  6.0  NEUTROABS  --  5.1  HGB 12.9 12.5  HCT 38.0 36.5  MCV  --  85.7  PLT  --  178   Basic Metabolic Panel:  Recent Labs Lab 04/16/16 1541 04/18/16 1011  NA 141 141  K 3.6 3.0*  CL 102 107  CO2  --  26  GLUCOSE 117* 216*  BUN 30* 25*  CREATININE 1.40* 1.16*  CALCIUM  --  9.4   GFR: Estimated Creatinine Clearance: 28.6 mL/min (A) (by C-G formula based on SCr of 1.16 mg/dL (H)). Liver Function Tests:  Recent Labs Lab 04/18/16 1011  AST 18  ALT 13*  ALKPHOS 44  BILITOT 0.7  PROT 8.2*  ALBUMIN 4.7    Recent Labs Lab 04/18/16 1011  LIPASE 38   No results for input(s): AMMONIA in the last 168 hours. Coagulation Profile: No results for input(s): INR, PROTIME in the last 168 hours. Cardiac Enzymes: No results for input(s): CKTOTAL, CKMB, CKMBINDEX, TROPONINI in the last 168 hours. BNP (last 3 results) No results for input(s): PROBNP in the last 8760 hours. HbA1C: No results for input(s): HGBA1C in the last 72 hours. CBG:  Recent Labs Lab 04/16/16 1457  GLUCAP 113*   Lipid Profile: No results for input(s): CHOL, HDL, LDLCALC, TRIG, CHOLHDL, LDLDIRECT in the last 72 hours. Thyroid Function Tests: No results for input(s): TSH, T4TOTAL, FREET4, T3FREE, THYROIDAB in the last 72 hours. Anemia Panel: No results for input(s): VITAMINB12, FOLATE, FERRITIN, TIBC, IRON, RETICCTPCT in the last 72 hours. Urine analysis:    Component Value Date/Time   COLORURINE YELLOW 04/18/2016 1105   APPEARANCEUR CLEAR 04/18/2016 1105   LABSPEC 1.016 04/18/2016 1105   PHURINE 6.0 04/18/2016 1105   GLUCOSEU >=500 (A) 04/18/2016 1105   HGBUR SMALL (A) 04/18/2016 1105   BILIRUBINUR NEGATIVE 04/18/2016 1105   KETONESUR 5 (A)  04/18/2016 1105   PROTEINUR NEGATIVE 04/18/2016 1105   UROBILINOGEN 1.0 05/26/2013 1648   NITRITE NEGATIVE 04/18/2016 1105   LEUKOCYTESUR NEGATIVE 04/18/2016 1105     Radiological Exams on Admission: Ct Abdomen Pelvis Wo Contrast  Result Date: 04/18/2016 CLINICAL DATA:  Generalized lower abdominal pain for 4 years, dementia, type II diabetes mellitus, hypertension, cholelithiasis, vascular disease, former smoker EXAM: CT ABDOMEN AND PELVIS WITHOUT CONTRAST TECHNIQUE: Multidetector CT imaging of the abdomen and pelvis was performed following the standard protocol without IV contrast. Sagittal and coronal MPR images reconstructed from axial data set. COMPARISON:  None FINDINGS: Lower chest: Basilar emphysematous changes with minimal atelectasis at lingula and lower lobes. Hepatobiliary: 2.0 cm calcified gallstone in gallbladder. Liver and gallbladder otherwise normal appearance Pancreas: Atrophic, otherwise unremarkable Spleen: Normal appearance. Adrenals/Urinary Tract: Thickening of adrenal glands without discrete mass. Tiny calcifications a kidneys which are likely a combination of vascular calcifications and a tiny upper pole RIGHT renal calculus. No mass or hydronephrosis. Bladder and ureters unremarkable Stomach/Bowel: Appendix not visualized, by history surgically absent. Small amount retained high attenuation material in RIGHT colon. Few sigmoid and descending diverticula without evidence of diverticulitis. Questionable rectal wall thickening versus artifact from underdistention Stomach unremarkable. Dilated small bowel loops in pelvis with decompressed distal small bowel loops compatible with small bowel obstruction question due to adhesion in the LEFT pelvis. Vascular/Lymphatic: No adenopathy. Atherosclerotic calcifications of the aorta, iliac arteries, and coronary arteries. Fusiform aneurysmal dilatation of the aorta at the aortic hiatus 4.0 cm diameter. Focal saccular aneurysmal dilatation at  the anterior proximal abdominal aorta at the level of the celiac artery, 4.2 x 3.2 cm image 25. Aneurysmal dilatation of the mid to distal abdominal aorta measuring 4.0 x 3.8 cm. Aneurysmal dilatation of common iliac artery 17 mm LEFT and 14 mm RIGHT. Reproductive: Uterus surgically absent.  Ovaries not visualized. Other: No hernia or definite inflammatory process. No free air or free fluid. Musculoskeletal: Diffuse osseous demineralization. IMPRESSION: Aneurysmal  dilatation of the abdominal aorta to 4.0 cm diameter at the aortic hiatus, 4.2 x 3.2 cm at the celiac artery, and a 4.0 x 3.8 cm at the mid to distal aorta. Aneurysmal dilatation of common iliac artery 17 mm LEFT and 14 mm RIGHT. Probable small bowel obstruction in the pelvis with dilated small bowel loops radiating from a point in the LEFT pelvis, question adhesion. Cannot exclude rectal wall thickening ; recommend proctoscopy to exclude rectal neoplasm. Cholelithiasis. Distal colonic diverticulosis. Electronically Signed   By: Ulyses SouthwardMark  Boles M.D.   On: 04/18/2016 11:58    EKG: Independently reviewed.  Paced rhythm  Assessment/Plan Active Problems:   Alzheimer's disease   Sick sinus syndrome (HCC)   Chronic atrial fibrillation (HCC)   Cardiac pacemaker in situ   Coronary artery disease involving coronary bypass graft of native heart without angina pectoris   HLD (hyperlipidemia)   Essential hypertension   Carotid artery stenosis, asymptomatic   SBO (small bowel obstruction)   Probable small bowel obstruction -General surgery consulted, appreciate input, she does not appear to be a great surgical candidate, conservative management for now  Coronary artery disease status post CABG -No chest pain, this appears to be stable  Sick sinus syndrome/A. fib status post Maze procedure and now with pacemaker -Monitor on telemetry at least for 12-24 hrs., her EKG shows paced rhythm. -Not on anticoagulation probably due to age/dementia, she is on  full dose aspirin.  Will hold that for now in case she needs surgical intervention - CHADSVASC score at least 2  Hypertension -Resume home medications, placed on IV hydralazine as needed if she is unable to take p.o.  Chronic kidney disease stage III -Patient's GFR has been in stage III range at least since 2015, creatinine appears stable right now.  Abdominal aortic aneurysm to max of 4.2 cm -This was present at least on a CT scan dated in 2010, so doubt acute or new  Alzheimer's dementia -She has underlying confusion, no behavioral disturbances noted, continue home medications   Diabetes mellitus type II -She is on metformin at home, place patient on sliding scale while here.   Addendum   Daughter was present at bedside later on after admission, discussed with her extensively, she appears to be very anxious and emotional herself, and tells me that patient has been having abdominal pain similar to this for the past couple years and nobody has been able to find out why.  She is very frustrated about that.  She also confirms CODE STATUS as being DNR.  She is concerned about patient being able to go back to her home, as a daughter is the caregiver and she seems to be very tired.  Will obtain physical therapy evaluation.  DVT prophylaxis: SCDs Code Status: DNR Family Communication: No family at bedside Disposition Plan: Admit to telemetry Consults called: General surgery    Admission status: Observation  At the point of initial evaluation, it is my clinical opinion that admission for OBSERVATION is reasonable and necessary because the patient's presenting complaints in the context of their chronic conditions represent sufficient risk of deterioration or significant morbidity to constitute reasonable grounds for close observation in the hospital setting, but that the patient may be medically stable for discharge from the hospital within 24 to 48 hours.   Pamella Pertostin Rustyn Conery, MD [facility] Pager [phone]  If 7PM-7AM, please contact night-coverage [email or web address] Password [email or web address]  04/18/2016, 1:34 PM

## 2016-04-18 NOTE — ED Notes (Signed)
[facility] [id number] [place]

## 2016-04-18 NOTE — Consult Note (Signed)
[facility] Surgery Consult Note  Valerie Carr Aug 28, 1929  [id number].    Requesting MD: Ralene Bathe Chief Complaint/Reason for Consult: SBO  HPI:  Valerie Carr is an 83yo female PMH dementia who presented to [facility] earlier today with worsening abdominal pain. Patient does have a h/o chronic abdominal pain, but states that this pain is different. The pain started early this morning. It is constant and global about her abdomen. Describes the pain as heavy. Reports 1 episode of yellow emesis. She tried taking pepto-bismal but this did not help. States that she is passing flatus. She denies fever, chills, diarrhea, constipation, hematochezia, or melena. Last meal patient thinks was this morning. Last BM today in ED  Hospital workup: - CT scan shows probably small bowel obstruction in the pelvis with dilated small bowel loops radiating from a point in the LEFT pelvis, question adhesion; cannot exclude rectal wall thickening  - WBC, lipase, LFT's WNL  PMH significant for dementia, CAD s/p CABG and pacemaker, atrial fibrillation s/p MAZE procedure, HLD, HTN Abdominal surgical history includes open appendectomy, TAH-BSO Anticoagulants: none Former smoker several years ago Lives in apartment alone in retirement community  ROS: Review of Systems  Constitutional: Negative.   HENT: Negative.   Eyes: Negative.   Respiratory: Negative.   Cardiovascular: Negative.   Gastrointestinal: Positive for abdominal pain, nausea and vomiting. Negative for blood in stool, constipation, diarrhea, heartburn and melena.  Genitourinary: Negative.   Musculoskeletal: Negative.   Skin: Negative.   Neurological: Negative.   All systems reviewed and otherwise negative except for as above  Family History  Problem Relation Age of Onset  . Diabetes Mother   . Hypertension Mother   . Coronary artery disease Father   . Diabetes Brother   . Throat cancer Brother   . Diabetes Brother   . Hypertension  Sister   . Cancer Son     esophageal    Past Medical History:  Diagnosis Date  . Arteriosclerotic cardiovascular disease (ASCVD)   . Chronic anxiety    and depression  . Dementia   . DM type 2 (diabetes mellitus, type 2) ([place])   . Gallstones    asysmptomatic  . HTN (hypertension)   . Hyperlipidemia   . Memory loss   . Sick sinus syndrome (HCC)    s/p PPM    Past Surgical History:  Procedure Laterality Date  . APPENDECTOMY    . CORONARY ARTERY BYPASS GRAFT     07-11-2005, triple bypass  . ESOPHAGOGASTRODUODENOSCOPY    . PACEMAKER INSERTION  2004   [employer] PPM implanted by Dr Leonia Reeves, generator change by Dr Rayann Heman 01/07/11  . PERMANENT PACEMAKER GENERATOR CHANGE N/A 01/07/2011   Procedure: PERMANENT PACEMAKER GENERATOR CHANGE;  Surgeon: Thompson Grayer, MD;  Location: [facility] CATH LAB;  Service: Cardiovascular;  Laterality: N/A;  . TONSILLECTOMY    . TOTAL ABDOMINAL HYSTERECTOMY W/ BILATERAL SALPINGOOPHORECTOMY      Social History:  reports that she has quit smoking. She has a 25.00 pack-year smoking history. She has quit using smokeless tobacco. She reports that she does not drink alcohol or use drugs.  Allergies:  Allergies  Allergen Reactions  . Clarithromycin Other (See Comments)    unknown  . Codeine Phosphate Other (See Comments)    unknown  . Effexor [Venlafaxine Hydrochloride] Other (See Comments)    unknown  . Fexofenadine Hcl Other (See Comments)    unknown  . Lisinopril Other (See Comments)    unknown  . Prozac [Fluoxetine  Hcl] Other (See Comments)    unknown  . Septra [Bactrim] Other (See Comments)    unknown  . Wellbutrin [Bupropion Hcl] Other (See Comments)    unknown     (Not in a hospital admission)  Prior to Admission medications   Medication Sig Start Date End Date Taking? Authorizing Provider  aspirin 325 MG tablet Take 325 mg by mouth daily.    Historical Provider, MD  donepezil (ARICEPT) 10 MG tablet TAKE 1 TABLET (10 MG TOTAL) BY  MOUTH AT BEDTIME. 06/27/15   Rosalin Hawking, MD  DULoxetine (CYMBALTA) 60 MG capsule Take 60 mg by mouth 2 (two) times daily.     Historical Provider, MD  memantine (NAMENDA) 10 MG tablet Take 1 tablet (10 mg total) by mouth 2 (two) times daily. 01/11/13   Philmore Pali, NP  metFORMIN (GLUCOPHAGE-XR) 500 MG 24 hr tablet Take 500 mg by mouth daily with breakfast.      Historical Provider, MD  omeprazole (PRILOSEC) 40 MG capsule Take 40 mg by mouth daily. 02/28/15   Historical Provider, MD  ONE TOUCH ULTRA TEST test strip TEST DAILY DX E11.51 06/19/14   Historical Provider, MD  QUEtiapine (SEROQUEL) 25 MG tablet Take 1 tablet (25 mg total) by mouth every morning. 11/08/15   Rosalin Hawking, MD  SYNJARDY XR 10-998 MG TB24  10/17/15   Historical Provider, MD  traZODone (DESYREL) 100 MG tablet Take 100 mg by mouth at bedtime.      Historical Provider, MD  valsartan (DIOVAN) 80 MG tablet Take 80 mg by mouth daily. 02/28/15   Historical Provider, MD    Blood pressure (!) 166/61, pulse 70, temperature 97.4 F (36.3 C), temperature source Oral, resp. rate 18, height _0  (1.575 m), weight 128 lb (58.1 kg), SpO2 100 %. Physical Exam: General: pleasant, WD/WN white female who is laying in bed in NAD but does appear uncomfortable  HEENT: head is normocephalic, atraumatic.  Sclera are noninjected.  Mouth is dry Heart: regular, rate, and rhythm.  No obvious murmurs, gallops, or rubs noted.  Palpable pedal pulses bilaterally Lungs: CTAB, no wheezes, rhonchi, or rales noted.  Respiratory effort nonlabored Abd: well healed RLQ and midline incision, soft, ND, few BS heard, no masses, hernias, or organomegaly. Mild global tenderness. No focal tenderness, rebound, or guarding MS: all 4 extremities are symmetrical with no cyanosis, clubbing, or edema. Skin: warm and dry with no masses, lesions, or rashes Psych: alert and oriented to person and place with an appropriate affect. Neuro: CM 2-12 intact, extremity CSM intact  bilaterally, normal speech  Results for orders placed or performed during the hospital encounter of 04/18/16 (from the past 48 hour(s))  Comprehensive metabolic panel     Status: Abnormal   Collection Time: 04/18/16 10:11 AM  Result Value Ref Range   Sodium 141 135 - 145 mmol/L   Potassium 3.0 (L) 3.5 - 5.1 mmol/L   Chloride 107 101 - 111 mmol/L   CO2 26 22 - 32 mmol/L   Glucose, Bld 216 (H) 65 - 99 mg/dL   BUN 25 (H) 6 - 20 mg/dL   Creatinine, Ser 1.16 (H) 0.44 - 1.00 mg/dL   Calcium 9.4 8.9 - 10.3 mg/dL   Total Protein 8.2 (H) 6.5 - 8.1 g/dL   Albumin 4.7 3.5 - 5.0 g/dL   AST 18 15 - 41 U/L   ALT 13 (L) 14 - 54 U/L   Alkaline Phosphatase 44 38 - 126 U/L   Total Bilirubin  0.7 0.3 - 1.2 mg/dL   GFR calc non Af Amer 42 (L) >60 mL/min   GFR calc Af Amer 49 (L) >60 mL/min    Comment: (NOTE) The eGFR has been calculated using the CKD EPI equation. This calculation has not been validated in all clinical situations. eGFR's persistently <60 mL/min signify possible Chronic Kidney Disease.    Anion gap 8 5 - 15  CBC with Differential     Status: Abnormal   Collection Time: 04/18/16 10:11 AM  Result Value Ref Range   WBC 6.0 4.0 - 10.5 K/uL   RBC 4.26 3.87 - 5.11 MIL/uL   Hemoglobin 12.5 12.0 - 15.0 g/dL   HCT 36.5 36.0 - 46.0 %   MCV 85.7 78.0 - 100.0 fL   MCH 29.3 26.0 - 34.0 pg   MCHC 34.2 30.0 - 36.0 g/dL   RDW 13.3 11.5 - 15.5 %   Platelets 178 150 - 400 K/uL   Neutrophils Relative % 83 %   Neutro Abs 5.1 1.7 - 7.7 K/uL   Lymphocytes Relative 11 %   Lymphs Abs 0.6 (L) 0.7 - 4.0 K/uL   Monocytes Relative 5 %   Monocytes Absolute 0.3 0.1 - 1.0 K/uL   Eosinophils Relative 1 %   Eosinophils Absolute 0.0 0.0 - 0.7 K/uL   Basophils Relative 0 %   Basophils Absolute 0.0 0.0 - 0.1 K/uL  Lipase, blood     Status: None   Collection Time: 04/18/16 10:11 AM  Result Value Ref Range   Lipase 38 11 - 51 U/L  I-stat troponin, ED     Status: None   Collection Time: 04/18/16 10:19 AM   Result Value Ref Range   Troponin i, poc 0.00 0.00 - 0.08 ng/mL   Comment 3            Comment: Due to the release kinetics of cTnI, a negative result within the first hours of the onset of symptoms does not rule out myocardial infarction with certainty. If myocardial infarction is still suspected, repeat the test at appropriate intervals.   Urinalysis, Routine w reflex microscopic     Status: Abnormal   Collection Time: 04/18/16 11:05 AM  Result Value Ref Range   Color, Urine YELLOW YELLOW   APPearance CLEAR CLEAR   Specific Gravity, Urine 1.016 1.005 - 1.030   pH 6.0 5.0 - 8.0   Glucose, UA >=500 (A) NEGATIVE mg/dL   Hgb urine dipstick SMALL (A) NEGATIVE   Bilirubin Urine NEGATIVE NEGATIVE   Ketones, ur 5 (A) NEGATIVE mg/dL   Protein, ur NEGATIVE NEGATIVE mg/dL   Nitrite NEGATIVE NEGATIVE   Leukocytes, UA NEGATIVE NEGATIVE   RBC / HPF 0-5 0 - 5 RBC/hpf   WBC, UA 0-5 0 - 5 WBC/hpf   Bacteria, UA NONE SEEN NONE SEEN   Squamous Epithelial / LPF 0-5 (A) NONE SEEN   Mucous PRESENT    Ct Abdomen Pelvis Wo Contrast  Result Date: 04/18/2016 CLINICAL DATA:  Generalized lower abdominal pain for 4 years, dementia, type II diabetes mellitus, hypertension, cholelithiasis, vascular disease, former smoker EXAM: CT ABDOMEN AND PELVIS WITHOUT CONTRAST TECHNIQUE: Multidetector CT imaging of the abdomen and pelvis was performed following the standard protocol without IV contrast. Sagittal and coronal MPR images reconstructed from axial data set. COMPARISON:  None FINDINGS: Lower chest: Basilar emphysematous changes with minimal atelectasis at lingula and lower lobes. Hepatobiliary: 2.0 cm calcified gallstone in gallbladder. Liver and gallbladder otherwise normal appearance Pancreas: Atrophic, otherwise unremarkable  Spleen: Normal appearance. Adrenals/Urinary Tract: Thickening of adrenal glands without discrete mass. Tiny calcifications a kidneys which are likely a combination of vascular  calcifications and a tiny upper pole RIGHT renal calculus. No mass or hydronephrosis. Bladder and ureters unremarkable Stomach/Bowel: Appendix not visualized, by history surgically absent. Small amount retained high attenuation material in RIGHT colon. Few sigmoid and descending diverticula without evidence of diverticulitis. Questionable rectal wall thickening versus artifact from underdistention Stomach unremarkable. Dilated small bowel loops in pelvis with decompressed distal small bowel loops compatible with small bowel obstruction question due to adhesion in the LEFT pelvis. Vascular/Lymphatic: No adenopathy. Atherosclerotic calcifications of the aorta, iliac arteries, and coronary arteries. Fusiform aneurysmal dilatation of the aorta at the aortic hiatus 4.0 cm diameter. Focal saccular aneurysmal dilatation at the anterior proximal abdominal aorta at the level of the celiac artery, 4.2 x 3.2 cm image 25. Aneurysmal dilatation of the mid to distal abdominal aorta measuring 4.0 x 3.8 cm. Aneurysmal dilatation of common iliac artery 17 mm LEFT and 14 mm RIGHT. Reproductive: Uterus surgically absent.  Ovaries not visualized. Other: No hernia or definite inflammatory process. No free air or free fluid. Musculoskeletal: Diffuse osseous demineralization. IMPRESSION: Aneurysmal dilatation of the abdominal aorta to 4.0 cm diameter at the aortic hiatus, 4.2 x 3.2 cm at the celiac artery, and a 4.0 x 3.8 cm at the mid to distal aorta. Aneurysmal dilatation of common iliac artery 17 mm LEFT and 14 mm RIGHT. Probable small bowel obstruction in the pelvis with dilated small bowel loops radiating from a point in the LEFT pelvis, question adhesion. Cannot exclude rectal wall thickening ; recommend proctoscopy to exclude rectal neoplasm. Cholelithiasis. Distal colonic diverticulosis. Electronically Signed   By: Lavonia Dana M.D.   On: 04/18/2016 11:58      Assessment/Plan Abdominal pain Possible small bowel  obstruction - Abdominal surgical history includes open appendectomy, TAH-BSO - chronic abdominal pain, acute onset severe pain with n/v x1 - CT scan shows probably small bowel obstruction in the pelvis with dilated small bowel loops radiating from a point in the LEFT pelvis, question adhesion; cannot exclude rectal wall thickening  - WBC, lipase, LFT's WNL - patient did have a BM in ED and states that she is passing some flatus  Possible rectal wall thickening - seen on CT scan, will review with MD  Abdominal aortic aneurysm  - seen in CT scan, up to 4cm  Dementia CAD s/p CABG and pacemaker Atrial fibrillation s/p MAZE procedure HLD HTN  ID - none VTE - SCDs FEN - IVF, NPO  Plan - Recommend admit to medicine for multiple medical problems. May have a SBO, but seems to be resolving as she has had a BM this afternoon and she is passing flatus. Recommend NPO, bowel rest, IVF, pain control, antiemetics as needed. If she vomits again may need to consider NGT, but will wait for now. No family currently at bedside but daughter may be on the way who could help with her history.  Jerrye Beavers, [facility] Surgery 04/18/2016, 1:00 PM Pager: [phone] Consults: [phone] Mon-Fri 7:00 am-4:30 pm Sat-Sun 7:00 am-11:30 am

## 2016-04-18 NOTE — ED Notes (Signed)
Bed: [id number] Expected date:  Expected time:  Means of arrival:  Comments: EMS 80's abd pain

## 2016-04-18 NOTE — ED Provider Notes (Signed)
[facility]-EMERGENCY DEPT Provider Note   CSN: [id number] Arrival date & time: 04/18/16  4098     History   Chief Complaint Chief Complaint  Patient presents with  . Abdominal Pain  . Nausea  . Emesis    HPI CAYLA WIEGAND is a 83 y.o. female.  The history is provided by the patient. No language interpreter was used.  Abdominal Pain   Associated symptoms include vomiting.  Emesis   Associated symptoms include abdominal pain.    TRICHELLE LEHAN is a 83 y.o. female who presents to the Emergency Department complaining of abdominal pain, vomiting.  She presents via EMS for evaluation of abdominal pain and vomiting. She reports waking up this morning with severe epigastric abdominal pain that radiates down to her pelvis. She has associated nausea and vomiting with multiple episodes of emesis at home. No fevers, chest pain, diarrhea, dysuria. She does state that it hurts in her abdomen when she takes a deep breath. She reports a history of abdominal pain in the past but this is different than prior episodes.  Past Medical History:  Diagnosis Date  . Arteriosclerotic cardiovascular disease (ASCVD)   . Chronic anxiety    and depression  . Dementia   . DM type 2 (diabetes mellitus, type 2) (HCC)   . Gallstones    asysmptomatic  . HTN (hypertension)   . Hyperlipidemia   . Memory loss   . Sick sinus syndrome [facility])    s/p PPM    Patient Active Problem List   Diagnosis Date Noted  . SBO (small bowel obstruction) 04/18/2016  . Chronic atrial fibrillation (HCC) 05/31/2014  . Cardiac pacemaker in situ 05/31/2014  . Coronary artery disease involving coronary bypass graft of native heart without angina pectoris 05/31/2014  . HLD (hyperlipidemia) 05/31/2014  . Essential hypertension 05/31/2014  . Carotid artery stenosis, asymptomatic 05/31/2014  . Sick sinus syndrome (HCC) 04/18/2013  . Coronary atherosclerosis of native coronary artery 01/11/2013  . Essential hypertension, benign  01/11/2013  . Mixed hyperlipidemia 01/11/2013  . Alzheimer's disease 09/02/2012  . Memory loss 09/02/2012  . Bradycardia 01/06/2011    Past Surgical History:  Procedure Laterality Date  . APPENDECTOMY    . CORONARY ARTERY BYPASS GRAFT     07-11-2005, triple bypass  . ESOPHAGOGASTRODUODENOSCOPY    . PACEMAKER INSERTION  2004   [employer] PPM implanted by Dr Amil Amen, generator change by Dr Johney Frame 01/07/11  . PERMANENT PACEMAKER GENERATOR CHANGE N/A 01/07/2011   Procedure: PERMANENT PACEMAKER GENERATOR CHANGE;  Surgeon: Hillis Range, MD;  Location: [facility] CATH LAB;  Service: Cardiovascular;  Laterality: N/A;  . TONSILLECTOMY    . TOTAL ABDOMINAL HYSTERECTOMY W/ BILATERAL SALPINGOOPHORECTOMY      OB History    No data available       Home Medications    Prior to Admission medications   Medication Sig Start Date End Date Taking? Authorizing Provider  aspirin 325 MG tablet Take 325 mg by mouth daily.    Historical Provider, MD  donepezil (ARICEPT) 10 MG tablet TAKE 1 TABLET (10 MG TOTAL) BY MOUTH AT BEDTIME. 06/27/15   Marvel Plan, MD  DULoxetine (CYMBALTA) 60 MG capsule Take 60 mg by mouth 2 (two) times daily.     Historical Provider, MD  memantine (NAMENDA) 10 MG tablet Take 1 tablet (10 mg total) by mouth 2 (two) times daily. 01/11/13   Ronal Fear, NP  metFORMIN (GLUCOPHAGE-XR) 500 MG 24 hr tablet Take 500 mg by mouth daily  with breakfast.      Historical Provider, MD  omeprazole (PRILOSEC) 40 MG capsule Take 40 mg by mouth daily. 02/28/15   Historical Provider, MD  ONE TOUCH ULTRA TEST test strip TEST DAILY DX E11.51 06/19/14   Historical Provider, MD  QUEtiapine (SEROQUEL) 25 MG tablet Take 1 tablet (25 mg total) by mouth every morning. 11/08/15   Marvel Plan, MD  SYNJARDY XR 10-998 MG TB24  10/17/15   Historical Provider, MD  traZODone (DESYREL) 100 MG tablet Take 100 mg by mouth at bedtime.      Historical Provider, MD  valsartan (DIOVAN) 80 MG tablet Take 80 mg by mouth daily.  02/28/15   Historical Provider, MD    Family History Family History  Problem Relation Age of Onset  . Diabetes Mother   . Hypertension Mother   . Coronary artery disease Father   . Diabetes Brother   . Throat cancer Brother   . Diabetes Brother   . Hypertension Sister   . Cancer Son     esophageal    Social History Social History  Substance Use Topics  . Smoking status: Former Smoker    Packs/day: 1.00    Years: 25.00  . Smokeless tobacco: Former Neurosurgeon     Comment: quit in 2001  . Alcohol use No     Allergies   Clarithromycin; Codeine phosphate; Effexor [venlafaxine hydrochloride]; Fexofenadine hcl; Lisinopril; Prozac [fluoxetine hcl]; Septra [bactrim]; and Wellbutrin [bupropion hcl]   Review of Systems Review of Systems  Gastrointestinal: Positive for abdominal pain and vomiting.  All other systems reviewed and are negative.    Physical Exam Updated Vital Signs BP (!) 151/65 (BP Location: Right Arm)   Pulse 64   Temp 97.8 F (36.6 C) (Oral)   Resp 17   Ht 5\' 2"  (1.575 m)   Wt 128 lb (58.1 kg)   SpO2 97%   BMI 23.41 kg/m   Physical Exam  Constitutional: She appears well-developed and well-nourished.  HENT:  Head: Normocephalic and atraumatic.  Cardiovascular: Normal rate and regular rhythm.   No murmur heard. Pulmonary/Chest: Effort normal and breath sounds normal. No respiratory distress.  Abdominal: Soft. There is no rebound and no guarding.  Moderate upper abdominal tenderness without guarding or rebound  Musculoskeletal: She exhibits no edema or tenderness.  Neurological: She is alert.  States the year is 2017, mildly confused, oriented to location.  Skin: Skin is warm and dry.  Psychiatric: She has a normal mood and affect. Her behavior is normal.  Nursing note and vitals reviewed.    ED Treatments / Results  Labs (all labs ordered are listed, but only abnormal results are displayed) Labs Reviewed  COMPREHENSIVE METABOLIC PANEL - Abnormal;  Notable for the following:       Result Value   Potassium 3.0 (*)    Glucose, Bld 216 (*)    BUN 25 (*)    Creatinine, Ser 1.16 (*)    Total Protein 8.2 (*)    ALT 13 (*)    GFR calc non Af Amer 42 (*)    GFR calc Af Amer 49 (*)    All other components within normal limits  CBC WITH DIFFERENTIAL/PLATELET - Abnormal; Notable for the following:    Lymphs Abs 0.6 (*)    All other components within normal limits  URINALYSIS, ROUTINE W REFLEX MICROSCOPIC - Abnormal; Notable for the following:    Glucose, UA >=500 (*)    Hgb urine dipstick SMALL (*)  Ketones, ur 5 (*)    Squamous Epithelial / LPF 0-5 (*)    All other components within normal limits  MRSA PCR SCREENING  LIPASE, BLOOD  SALICYLATE LEVEL  I-STAT TROPOININ, ED    EKG  EKG Interpretation  Date/Time:  Friday April 18 2016 10:05:37 EDT Ventricular Rate:  70 PR Interval:    QRS Duration: 114 QT Interval:  448 QTC Calculation: 484 R Axis:   -4 Text Interpretation:  Atrial-paced complexes Prolonged PR interval Borderline intraventricular conduction delay Minimal ST depression, inferior leads Confirmed by Lincoln Brighamees, Liz [phone]([id number]) on 04/18/2016 10:16:41 AM       Radiology Ct Abdomen Pelvis Wo Contrast  Result Date: 04/18/2016 CLINICAL DATA:  Generalized lower abdominal pain for 4 years, dementia, type II diabetes mellitus, hypertension, cholelithiasis, vascular disease, former smoker EXAM: CT ABDOMEN AND PELVIS WITHOUT CONTRAST TECHNIQUE: Multidetector CT imaging of the abdomen and pelvis was performed following the standard protocol without IV contrast. Sagittal and coronal MPR images reconstructed from axial data set. COMPARISON:  None FINDINGS: Lower chest: Basilar emphysematous changes with minimal atelectasis at lingula and lower lobes. Hepatobiliary: 2.0 cm calcified gallstone in gallbladder. Liver and gallbladder otherwise normal appearance Pancreas: Atrophic, otherwise unremarkable Spleen: Normal appearance.  Adrenals/Urinary Tract: Thickening of adrenal glands without discrete mass. Tiny calcifications a kidneys which are likely a combination of vascular calcifications and a tiny upper pole RIGHT renal calculus. No mass or hydronephrosis. Bladder and ureters unremarkable Stomach/Bowel: Appendix not visualized, by history surgically absent. Small amount retained high attenuation material in RIGHT colon. Few sigmoid and descending diverticula without evidence of diverticulitis. Questionable rectal wall thickening versus artifact from underdistention Stomach unremarkable. Dilated small bowel loops in pelvis with decompressed distal small bowel loops compatible with small bowel obstruction question due to adhesion in the LEFT pelvis. Vascular/Lymphatic: No adenopathy. Atherosclerotic calcifications of the aorta, iliac arteries, and coronary arteries. Fusiform aneurysmal dilatation of the aorta at the aortic hiatus 4.0 cm diameter. Focal saccular aneurysmal dilatation at the anterior proximal abdominal aorta at the level of the celiac artery, 4.2 x 3.2 cm image 25. Aneurysmal dilatation of the mid to distal abdominal aorta measuring 4.0 x 3.8 cm. Aneurysmal dilatation of common iliac artery 17 mm LEFT and 14 mm RIGHT. Reproductive: Uterus surgically absent.  Ovaries not visualized. Other: No hernia or definite inflammatory process. No free air or free fluid. Musculoskeletal: Diffuse osseous demineralization. IMPRESSION: Aneurysmal dilatation of the abdominal aorta to 4.0 cm diameter at the aortic hiatus, 4.2 x 3.2 cm at the celiac artery, and a 4.0 x 3.8 cm at the mid to distal aorta. Aneurysmal dilatation of common iliac artery 17 mm LEFT and 14 mm RIGHT. Probable small bowel obstruction in the pelvis with dilated small bowel loops radiating from a point in the LEFT pelvis, question adhesion. Cannot exclude rectal wall thickening ; recommend proctoscopy to exclude rectal neoplasm. Cholelithiasis. Distal colonic  diverticulosis. Electronically Signed   By: Ulyses SouthwardMark  Boles M.D.   On: 04/18/2016 11:58    Procedures Procedures (including critical care time)  Medications Ordered in ED Medications  QUEtiapine (SEROQUEL) tablet 25 mg (not administered)  donepezil (ARICEPT) tablet 10 mg (not administered)  irbesartan (AVAPRO) tablet 37.5 mg (not administered)  memantine (NAMENDA) tablet 10 mg (not administered)  DULoxetine (CYMBALTA) DR capsule 60 mg (not administered)  traZODone (DESYREL) tablet 100 mg (not administered)  insulin aspart (novoLOG) injection 0-9 Units (not administered)  sodium chloride flush (NS) 0.9 % injection 3 mL (3 mLs Intravenous Given 04/18/16  1500)  dextrose 5 % and 0.45 % NaCl with KCl 20 mEq/L infusion (not administered)  hydrALAZINE (APRESOLINE) injection 5 mg (not administered)  ondansetron (ZOFRAN) injection 4 mg (not administered)  morphine 4 MG/ML injection 1 mg (1 mg Intravenous Given 04/18/16 1542)  ondansetron (ZOFRAN) injection 4 mg (4 mg Intravenous Given 04/18/16 1021)  lactated ringers bolus 500 mL (0 mLs Intravenous Stopped 04/18/16 1252)  famotidine (PEPCID) IVPB 20 mg premix (0 mg Intravenous Stopped 04/18/16 1252)  gi cocktail (Maalox,Lidocaine,Donnatal) (30 mLs Oral Given 04/18/16 1150)  morphine 4 MG/ML injection 2 mg (2 mg Intravenous Given 04/18/16 1331)     Initial Impression / Assessment and Plan / ED Course  I have reviewed the triage vital signs and the nursing notes.  Pertinent labs & imaging results that were available during my care of the patient were reviewed by me and considered in my medical decision making (see chart for details).   patient with history of dementia and chronic abdominal pain here with abdominal pain, vomiting. Patient is uncomfortable appearing on examination with abdominal tenderness. CT abdomen is concerning for possible bowel obstruction as well as findings of aortic aneurysm. On review of prior imaging patient had aortic aneurysm  present previously. Plan to admit for treatment of possible obstruction. Gen. surgery consulted regarding possible obstructive obstruction. Hospitalist consulted for admission for further workup and treatment.  Final Clinical Impressions(s) / ED Diagnoses   Final diagnoses:  None    New Prescriptions Current Discharge Medication List       Tilden Fossa, MD 04/18/16 1610

## 2016-04-18 NOTE — ED Notes (Signed)
Gave report to Floor RN

## 2016-04-19 ENCOUNTER — Observation Stay (HOSPITAL_COMMUNITY): Payer: Medicare Other

## 2016-04-19 DIAGNOSIS — Z950 Presence of cardiac pacemaker: Secondary | ICD-10-CM | POA: Diagnosis not present

## 2016-04-19 DIAGNOSIS — K55029 Acute infarction of small intestine, extent unspecified: Secondary | ICD-10-CM | POA: Diagnosis not present

## 2016-04-19 DIAGNOSIS — K56699 Other intestinal obstruction unspecified as to partial versus complete obstruction: Secondary | ICD-10-CM | POA: Diagnosis not present

## 2016-04-19 DIAGNOSIS — I482 Chronic atrial fibrillation: Secondary | ICD-10-CM | POA: Diagnosis not present

## 2016-04-19 DIAGNOSIS — E876 Hypokalemia: Secondary | ICD-10-CM | POA: Diagnosis not present

## 2016-04-19 DIAGNOSIS — M6281 Muscle weakness (generalized): Secondary | ICD-10-CM | POA: Diagnosis not present

## 2016-04-19 DIAGNOSIS — Z48815 Encounter for surgical aftercare following surgery on the digestive system: Secondary | ICD-10-CM | POA: Diagnosis not present

## 2016-04-19 DIAGNOSIS — E119 Type 2 diabetes mellitus without complications: Secondary | ICD-10-CM | POA: Diagnosis not present

## 2016-04-19 DIAGNOSIS — I495 Sick sinus syndrome: Secondary | ICD-10-CM | POA: Diagnosis not present

## 2016-04-19 DIAGNOSIS — Z808 Family history of malignant neoplasm of other organs or systems: Secondary | ICD-10-CM | POA: Diagnosis not present

## 2016-04-19 DIAGNOSIS — G308 Other Alzheimer's disease: Secondary | ICD-10-CM | POA: Diagnosis not present

## 2016-04-19 DIAGNOSIS — K55019 Acute (reversible) ischemia of small intestine, extent unspecified: Secondary | ICD-10-CM | POA: Diagnosis not present

## 2016-04-19 DIAGNOSIS — K55059 Acute (reversible) ischemia of intestine, part and extent unspecified: Secondary | ICD-10-CM | POA: Diagnosis not present

## 2016-04-19 DIAGNOSIS — I6523 Occlusion and stenosis of bilateral carotid arteries: Secondary | ICD-10-CM | POA: Diagnosis not present

## 2016-04-19 DIAGNOSIS — R1033 Periumbilical pain: Secondary | ICD-10-CM | POA: Diagnosis not present

## 2016-04-19 DIAGNOSIS — Z9889 Other specified postprocedural states: Secondary | ICD-10-CM | POA: Diagnosis not present

## 2016-04-19 DIAGNOSIS — Z951 Presence of aortocoronary bypass graft: Secondary | ICD-10-CM | POA: Diagnosis not present

## 2016-04-19 DIAGNOSIS — R2689 Other abnormalities of gait and mobility: Secondary | ICD-10-CM | POA: Diagnosis not present

## 2016-04-19 DIAGNOSIS — F0280 Dementia in other diseases classified elsewhere without behavioral disturbance: Secondary | ICD-10-CM | POA: Diagnosis not present

## 2016-04-19 DIAGNOSIS — Z9049 Acquired absence of other specified parts of digestive tract: Secondary | ICD-10-CM | POA: Diagnosis not present

## 2016-04-19 DIAGNOSIS — Z7982 Long term (current) use of aspirin: Secondary | ICD-10-CM | POA: Diagnosis not present

## 2016-04-19 DIAGNOSIS — E43 Unspecified severe protein-calorie malnutrition: Secondary | ICD-10-CM | POA: Diagnosis not present

## 2016-04-19 DIAGNOSIS — Z885 Allergy status to narcotic agent status: Secondary | ICD-10-CM | POA: Diagnosis not present

## 2016-04-19 DIAGNOSIS — R1010 Upper abdominal pain, unspecified: Secondary | ICD-10-CM | POA: Diagnosis not present

## 2016-04-19 DIAGNOSIS — K56609 Unspecified intestinal obstruction, unspecified as to partial versus complete obstruction: Secondary | ICD-10-CM | POA: Diagnosis not present

## 2016-04-19 DIAGNOSIS — K8020 Calculus of gallbladder without cholecystitis without obstruction: Secondary | ICD-10-CM | POA: Diagnosis not present

## 2016-04-19 DIAGNOSIS — Z7984 Long term (current) use of oral hypoglycemic drugs: Secondary | ICD-10-CM | POA: Diagnosis not present

## 2016-04-19 DIAGNOSIS — I129 Hypertensive chronic kidney disease with stage 1 through stage 4 chronic kidney disease, or unspecified chronic kidney disease: Secondary | ICD-10-CM | POA: Diagnosis not present

## 2016-04-19 DIAGNOSIS — R1084 Generalized abdominal pain: Secondary | ICD-10-CM | POA: Diagnosis not present

## 2016-04-19 DIAGNOSIS — Z79899 Other long term (current) drug therapy: Secondary | ICD-10-CM | POA: Diagnosis not present

## 2016-04-19 DIAGNOSIS — I2510 Atherosclerotic heart disease of native coronary artery without angina pectoris: Secondary | ICD-10-CM | POA: Diagnosis not present

## 2016-04-19 DIAGNOSIS — E785 Hyperlipidemia, unspecified: Secondary | ICD-10-CM | POA: Diagnosis not present

## 2016-04-19 DIAGNOSIS — Z87891 Personal history of nicotine dependence: Secondary | ICD-10-CM | POA: Diagnosis not present

## 2016-04-19 DIAGNOSIS — Z8249 Family history of ischemic heart disease and other diseases of the circulatory system: Secondary | ICD-10-CM | POA: Diagnosis not present

## 2016-04-19 DIAGNOSIS — R188 Other ascites: Secondary | ICD-10-CM | POA: Diagnosis not present

## 2016-04-19 DIAGNOSIS — R278 Other lack of coordination: Secondary | ICD-10-CM | POA: Diagnosis not present

## 2016-04-19 DIAGNOSIS — I714 Abdominal aortic aneurysm, without rupture: Secondary | ICD-10-CM | POA: Diagnosis not present

## 2016-04-19 DIAGNOSIS — E782 Mixed hyperlipidemia: Secondary | ICD-10-CM | POA: Diagnosis not present

## 2016-04-19 DIAGNOSIS — D62 Acute posthemorrhagic anemia: Secondary | ICD-10-CM | POA: Diagnosis not present

## 2016-04-19 DIAGNOSIS — N183 Chronic kidney disease, stage 3 (moderate): Secondary | ICD-10-CM | POA: Diagnosis not present

## 2016-04-19 DIAGNOSIS — N179 Acute kidney failure, unspecified: Secondary | ICD-10-CM | POA: Diagnosis not present

## 2016-04-19 DIAGNOSIS — G309 Alzheimer's disease, unspecified: Secondary | ICD-10-CM | POA: Diagnosis not present

## 2016-04-19 DIAGNOSIS — Z833 Family history of diabetes mellitus: Secondary | ICD-10-CM | POA: Diagnosis not present

## 2016-04-19 DIAGNOSIS — I10 Essential (primary) hypertension: Secondary | ICD-10-CM | POA: Diagnosis not present

## 2016-04-19 DIAGNOSIS — K565 Intestinal adhesions [bands], unspecified as to partial versus complete obstruction: Secondary | ICD-10-CM | POA: Diagnosis not present

## 2016-04-19 DIAGNOSIS — Z888 Allergy status to other drugs, medicaments and biological substances status: Secondary | ICD-10-CM | POA: Diagnosis not present

## 2016-04-19 DIAGNOSIS — Z4682 Encounter for fitting and adjustment of non-vascular catheter: Secondary | ICD-10-CM | POA: Diagnosis not present

## 2016-04-19 DIAGNOSIS — I25810 Atherosclerosis of coronary artery bypass graft(s) without angina pectoris: Secondary | ICD-10-CM | POA: Diagnosis not present

## 2016-04-19 DIAGNOSIS — Z90710 Acquired absence of both cervix and uterus: Secondary | ICD-10-CM | POA: Diagnosis not present

## 2016-04-19 DIAGNOSIS — K559 Vascular disorder of intestine, unspecified: Secondary | ICD-10-CM | POA: Diagnosis not present

## 2016-04-19 LAB — COMPREHENSIVE METABOLIC PANEL
ALK PHOS: 40 U/L (ref 38–126)
ALT: 10 U/L — ABNORMAL LOW (ref 14–54)
ANION GAP: 10 (ref 5–15)
AST: 17 U/L (ref 15–41)
Albumin: 3.5 g/dL (ref 3.5–5.0)
BUN: 26 mg/dL — ABNORMAL HIGH (ref 6–20)
CALCIUM: 8.5 mg/dL — AB (ref 8.9–10.3)
CHLORIDE: 106 mmol/L (ref 101–111)
CO2: 21 mmol/L — AB (ref 22–32)
Creatinine, Ser: 1.12 mg/dL — ABNORMAL HIGH (ref 0.44–1.00)
GFR calc non Af Amer: 44 mL/min — ABNORMAL LOW (ref >60)
GFR, EST AFRICAN AMERICAN: 51 mL/min — AB (ref >60)
GLUCOSE: 324 mg/dL — AB (ref 65–99)
POTASSIUM: 3.9 mmol/L (ref 3.5–5.1)
SODIUM: 137 mmol/L (ref 135–145)
Total Bilirubin: 0.7 mg/dL (ref 0.3–1.2)
Total Protein: 6.8 g/dL (ref 6.5–8.1)

## 2016-04-19 LAB — GLUCOSE, CAPILLARY
GLUCOSE-CAPILLARY: 208 mg/dL — AB (ref 65–99)
Glucose-Capillary: 285 mg/dL — ABNORMAL HIGH (ref 65–99)
Glucose-Capillary: 232 mg/dL — ABNORMAL HIGH (ref 65–99)
Glucose-Capillary: 151 mg/dL — ABNORMAL HIGH (ref 65–99)

## 2016-04-19 LAB — CBC
HEMATOCRIT: 41.8 % (ref 36.0–46.0)
HEMOGLOBIN: 14.4 g/dL (ref 12.0–15.0)
MCH: 29.1 pg (ref 26.0–34.0)
MCHC: 34.4 g/dL (ref 30.0–36.0)
MCV: 84.6 fL (ref 78.0–100.0)
Platelets: 278 10*3/uL (ref 150–400)
RBC: 4.94 MIL/uL (ref 3.87–5.11)
RDW: 13.4 % (ref 11.5–15.5)
WBC: 15.9 10*3/uL — ABNORMAL HIGH (ref 4.0–10.5)

## 2016-04-19 MED ORDER — DIPHENHYDRAMINE HCL 50 MG/ML IJ SOLN
12.5000 mg | Freq: Three times a day (TID) | INTRAMUSCULAR | Status: DC | PRN
  Administered 2016-04-20: 12.5 mg via INTRAVENOUS
  Filled 2016-04-19: qty 1

## 2016-04-19 NOTE — Progress Notes (Signed)
General Surgery [facility]  Assessment & Plan:  Abdominal pain, rule out small bowel obstruction  Patient with much less pain this AM  WBC up to 15.9K this AM, afebrile  AXR just done - appears to have solitary loop small bowel in upper pelvis mildly dilated  Will follow with you        Velora Heckler, MD, [facility]       Office: [phone]    Subjective: Patient in bed, appears comfortable, denies abdominal pain.  Wants to eat "mashed potatoes" this AM.  Objective: Vital signs in last 24 hours: Temp:  [97.5 F (36.4 C)-97.8 F (36.6 C)] 97.5 F (36.4 C) (03/23 2206) Pulse Rate:  [64-71] 71 (03/23 2206) Resp:  [17-18] 18 (03/23 2206) BP: (130-151)/(65-91) 130/91 (03/23 2206) SpO2:  [97 %-98 %] 98 % (03/23 2206) Last BM Date: 04/18/16  Intake/Output from previous day: 03/23 0701 - 03/24 0700 In: 1500 [I.V.:900; IV Piggyback:600] Out: -  Intake/Output this shift: No intake/output data recorded.  Physical Exam: HEENT - sclerae clear, mucous membranes moist Neck - soft Chest - clear bilaterally Cor - RRR Abdomen - soft, mild distension; few BS present; mild tenderness LLQ without mass; well healed midline incision Ext - no edema, non-tender Neuro - mild confusion, non-focal  Lab Results:   Recent Labs  04/18/16 1011 04/19/16 0529  WBC 6.0 15.9*  HGB 12.5 14.4  HCT 36.5 41.8  PLT 178 278   BMET  Recent Labs  04/18/16 1011 04/19/16 0529  NA 141 137  K 3.0* 3.9  CL 107 106  CO2 26 21*  GLUCOSE 216* 324*  BUN 25* 26*  CREATININE 1.16* 1.12*  CALCIUM 9.4 8.5*   PT/INR No results for input(s): LABPROT, INR in the last 72 hours. Comprehensive Metabolic Panel:    Component Value Date/Time   NA 137 04/19/2016 0529   NA 141 04/18/2016 1011   K 3.9 04/19/2016 0529   K 3.0 (L) 04/18/2016 1011   CL 106 04/19/2016 0529   CL 107 04/18/2016 1011   CO2 21 (L) 04/19/2016 0529   CO2 26 04/18/2016  1011   BUN 26 (H) 04/19/2016 0529   BUN 25 (H) 04/18/2016 1011   CREATININE 1.12 (H) 04/19/2016 0529   CREATININE 1.16 (H) 04/18/2016 1011   GLUCOSE 324 (H) 04/19/2016 0529   GLUCOSE 216 (H) 04/18/2016 1011   CALCIUM 8.5 (L) 04/19/2016 0529   CALCIUM 9.4 04/18/2016 1011   AST 17 04/19/2016 0529   AST 18 04/18/2016 1011   ALT 10 (L) 04/19/2016 0529   ALT 13 (L) 04/18/2016 1011   ALKPHOS 40 04/19/2016 0529   ALKPHOS 44 04/18/2016 1011   BILITOT 0.7 04/19/2016 0529   BILITOT 0.7 04/18/2016 1011   PROT 6.8 04/19/2016 0529   PROT 8.2 (H) 04/18/2016 1011   ALBUMIN 3.5 04/19/2016 0529   ALBUMIN 4.7 04/18/2016 1011    Studies/Results: Ct Abdomen Pelvis Wo Contrast  Result Date: 04/18/2016 CLINICAL DATA:  Generalized lower abdominal pain for 4 years, dementia, type II diabetes mellitus, hypertension, cholelithiasis, vascular disease, former smoker EXAM: CT ABDOMEN AND PELVIS WITHOUT CONTRAST TECHNIQUE: Multidetector CT imaging of the abdomen and pelvis was performed following the standard protocol without IV contrast. Sagittal and coronal MPR images reconstructed from axial data set. COMPARISON:  None FINDINGS: Lower chest: Basilar emphysematous changes with minimal atelectasis at lingula and lower lobes. Hepatobiliary: 2.0 cm calcified gallstone in  gallbladder. Liver and gallbladder otherwise normal appearance Pancreas: Atrophic, otherwise unremarkable Spleen: Normal appearance. Adrenals/Urinary Tract: Thickening of adrenal glands without discrete mass. Tiny calcifications a kidneys which are likely a combination of vascular calcifications and a tiny upper pole RIGHT renal calculus. No mass or hydronephrosis. Bladder and ureters unremarkable Stomach/Bowel: Appendix not visualized, by history surgically absent. Small amount retained high attenuation material in RIGHT colon. Few sigmoid and descending diverticula without evidence of diverticulitis. Questionable rectal wall thickening versus artifact  from underdistention Stomach unremarkable. Dilated small bowel loops in pelvis with decompressed distal small bowel loops compatible with small bowel obstruction question due to adhesion in the LEFT pelvis. Vascular/Lymphatic: No adenopathy. Atherosclerotic calcifications of the aorta, iliac arteries, and coronary arteries. Fusiform aneurysmal dilatation of the aorta at the aortic hiatus 4.0 cm diameter. Focal saccular aneurysmal dilatation at the anterior proximal abdominal aorta at the level of the celiac artery, 4.2 x 3.2 cm image 25. Aneurysmal dilatation of the mid to distal abdominal aorta measuring 4.0 x 3.8 cm. Aneurysmal dilatation of common iliac artery 17 mm LEFT and 14 mm RIGHT. Reproductive: Uterus surgically absent.  Ovaries not visualized. Other: No hernia or definite inflammatory process. No free air or free fluid. Musculoskeletal: Diffuse osseous demineralization. IMPRESSION: Aneurysmal dilatation of the abdominal aorta to 4.0 cm diameter at the aortic hiatus, 4.2 x 3.2 cm at the celiac artery, and a 4.0 x 3.8 cm at the mid to distal aorta. Aneurysmal dilatation of common iliac artery 17 mm LEFT and 14 mm RIGHT. Probable small bowel obstruction in the pelvis with dilated small bowel loops radiating from a point in the LEFT pelvis, question adhesion. Cannot exclude rectal wall thickening ; recommend proctoscopy to exclude rectal neoplasm. Cholelithiasis. Distal colonic diverticulosis. Electronically Signed   By: Valerie SouthwardMark  Boles M.D.   On: 04/18/2016 11:58      Lauralie Carr M 04/19/2016  Patient ID: Valerie PianoImogene J Carr, female   DOB: 1929-12-01, 83 y.o.   MRN: [id number]

## 2016-04-19 NOTE — Progress Notes (Addendum)
PROGRESS NOTE  Valerie Carr BJY:[id number] DOB: 17-Jan-1930 DOA: 04/18/2016 PCP: Cala Bradford, MD  HPI/Recap of past 24 hours:  Report abdominal pain, not able to tell me about vomiting, bm or flatus due to dementia, no fever, but Daughter at bedside, report vomiting this am  Assessment/Plan: Active Problems:   Alzheimer's disease   Sick sinus syndrome (HCC)   Chronic atrial fibrillation (HCC)   Cardiac pacemaker in situ   Coronary artery disease involving coronary bypass graft of native heart without angina pectoris   HLD (hyperlipidemia)   Essential hypertension   Carotid artery stenosis, asymptomatic   SBO (small bowel obstruction)  Abdominal pain, ?small bowel obstruction (h/o abdominal hysterectomy, appendectomy) abb x ray on 3/24 "Persistent mildly dilated small bowel loops in the left pelvis, suggesting persistent partial mid to distal small bowel obstruction" wbc elevated, Remain on ivf, remain npo, getting daily ab x ray, may need NG suction if no improving She has been requiring prn iv morphine for abdominal pain, last around 1pm today, she has required prn iv zofran for nausea, last dose at 12 noon General surgery is following, appreciate input  Cholelithiasis on CTAb/pel:  lft wnl  Coronary artery disease status post CABG -No chest pain, this appears to be stable, currently npo, oral meds held  Sick sinus syndrome/A. fib status post Maze procedure and now with pacemaker -Monitor on telemetry., her EKG shows paced rhythm. -Not on anticoagulation probably due to age/dementia, she is on full dose aspirin.  Will hold that for now in case she needs surgical intervention - CHADSVASC score at least 2  Hypertension -Resume home medications when able to take oral meds, placed on IV hydralazine as needed if she is unable to take p.o.  noninsulin dependent Diabetes mellitus type II -She is on metformin at home, place patient on sliding scale while here.  Chronic  kidney disease stage III -Patient's GFR has been in stage III range at least since 2015, creatinine appears stable right now. ua does showed glucose, but no infection  Abdominal aortic aneurysm to max of 4.2 cm -This was present at least on a CT scan dated in 2010, so doubt acute or new  Alzheimer's dementia -She has underlying confusion, (not oriented to time), no behavioral disturbances noted, continue home medications   Severe malnutrition: Daughter report significant weight loss in the last few months 15pounds Will ask nutrition to see if patient can take oral  FTT/frequent falls: she has lived at a independent living for the past 9years, will need higher level of care  I have discussed with daughter if patient does not improved, may consider palliative care/comfort measures, she agrees to it, but hopefully, patient will be able to improve and able to eat.     Code Status: DNR  Family Communication: patient and daughter at bedside  Disposition Plan: pending   Consultants:  General surgery  Procedures:  Daily abdominal x ray  Antibiotics:  none   Objective: BP (!) 130/91 (BP Location: Right Arm)   Pulse 71   Temp 97.5 F (36.4 C) (Oral)   Resp 18   Ht 5\' 2"  (1.575 m)   Wt 58.1 kg (128 lb)   SpO2 98%   BMI 23.41 kg/m   Intake/Output Summary (Last 24 hours) at 04/19/16 1358 Last data filed at 04/19/16 0744  Gross per 24 hour  Intake              950 ml  Output  250 ml  Net              700 ml   Filed Weights   04/18/16 0924  Weight: 58.1 kg (128 lb)    Exam:   General:  Frail, demented, NAD  Cardiovascular: paced rhythm  Respiratory: CTABL  Abdomen: diffuse abdominal tenderness, guarding, decreased bowel sounds,  Musculoskeletal: No Edema  Neuro: demented   Data Reviewed: Basic Metabolic Panel:  Recent Labs Lab 04/16/16 1541 04/18/16 1011 04/19/16 0529  NA 141 141 137  K 3.6 3.0* 3.9  CL 102 107 106  CO2  --  26  21*  GLUCOSE 117* 216* 324*  BUN 30* 25* 26*  CREATININE 1.40* 1.16* 1.12*  CALCIUM  --  9.4 8.5*   Liver Function Tests:  Recent Labs Lab 04/18/16 1011 04/19/16 0529  AST 18 17  ALT 13* 10*  ALKPHOS 44 40  BILITOT 0.7 0.7  PROT 8.2* 6.8  ALBUMIN 4.7 3.5    Recent Labs Lab 04/18/16 1011  LIPASE 38   No results for input(s): AMMONIA in the last 168 hours. CBC:  Recent Labs Lab 04/16/16 1541 04/18/16 1011 04/19/16 0529  WBC  --  6.0 15.9*  NEUTROABS  --  5.1  --   HGB 12.9 12.5 14.4  HCT 38.0 36.5 41.8  MCV  --  85.7 84.6  PLT  --  178 278   Cardiac Enzymes:   No results for input(s): CKTOTAL, CKMB, CKMBINDEX, TROPONINI in the last 168 hours. BNP (last 3 results) No results for input(s): BNP in the last 8760 hours.  ProBNP (last 3 results) No results for input(s): PROBNP in the last 8760 hours.  CBG:  Recent Labs Lab 04/16/16 1457 04/18/16 1638 04/18/16 2152 04/19/16 0743 04/19/16 1148  GLUCAP 113* 226* 272* 285* 232*    Recent Results (from the past 240 hour(s))  MRSA PCR Screening     Status: None   Collection Time: 04/18/16  3:17 PM  Result Value Ref Range Status   MRSA by PCR NEGATIVE NEGATIVE Final    Comment:        The GeneXpert MRSA Assay (FDA approved for NASAL specimens only), is one component of a comprehensive MRSA colonization surveillance program. It is not intended to diagnose MRSA infection nor to guide or monitor treatment for MRSA infections.      Studies: Dg Abd 2 Views  Result Date: 04/19/2016 CLINICAL DATA:  Abdominal pain, small-bowel obstruction suspected on CT study from 1 day prior EXAM: ABDOMEN - 2 VIEW COMPARISON:  04/18/2016 CT abdomen/pelvis. FINDINGS: Persistent mildly dilated small bowel loops in the left pelvis measuring up to 3.2 cm diameter, not appreciably changed. Mild colonic stool volume. No evidence of pneumatosis or pneumoperitoneum. Aneurysmal atherosclerotic abdominal aorta. Cholelithiasis.  IMPRESSION: 1. Persistent mildly dilated small bowel loops in the left pelvis, suggesting persistent partial mid to distal small bowel obstruction. 2. No evidence of pneumatosis or pneumoperitoneum. 3. Cholelithiasis. 4. Aneurysmal atherosclerotic abdominal aorta. Electronically Signed   By: Delbert Phenix M.D.   On: 04/19/2016 09:46    Scheduled Meds: . chlorhexidine  15 mL Mouth Rinse BID  . donepezil  10 mg Oral QHS  . DULoxetine  60 mg Oral BID  . insulin aspart  0-9 Units Subcutaneous TID WC  . irbesartan  37.5 mg Oral Daily  . mouth rinse  15 mL Mouth Rinse q12n4p  . memantine  10 mg Oral BID  . QUEtiapine  25 mg Oral q morning -  10a  . sodium chloride flush  3 mL Intravenous Q12H  . traZODone  100 mg Oral QHS    Continuous Infusions: . dextrose 5 % and 0.45 % NaCl with KCl 20 mEq/L 75 mL/hr at 04/19/16 0500     Time spent: 35mins  Andersen Mckiver MD, PhD  [facility] Pager [phone]. If 7PM-7AM, please contact night-coverage at [email or web address], password [email or web address] 04/19/2016, 1:58 PM  LOS: 0 days

## 2016-04-19 NOTE — Evaluation (Signed)
Physical Therapy Evaluation Patient Details Name: Valerie Carr MRN: [id number] DOB: May 06, 1929 Today's Date: 04/19/2016   History of Present Illness  Valerie Carr is a 83 y.o. female with medical history significant of dementia, coronary artery disease status post CABG, atrial fibrillation status post Maze procedure and sick sinus syndrome status post pacemaker, hypertension, hyperlipidemia, type 2 diabetes mellitus, presents to the emergency room with chief complaint of abdominal pain, as well as nausea vomiting; CT + possible SBO  Clinical Impression  Pt admitted with above diagnosis. Pt currently with functional limitations due to the deficits listed below (see PT Problem List). * Pt will benefit from skilled PT to increase their independence and safety with mobility to allow discharge to the venue listed below.   Will continue to follow--pt dtr is very stressed about what they are going to do but it is obvious that this pt is not safe to continue living alone at this point  Mobility limited on eval, see below: Pt is extremely limited by dizziness and c/o pain; pt complained of dizziness in sitting and standing BP in sitting 120/64 BP after  Returning to supine 130/74 RN aware; Unable to stand a second time or long enough to get standing BP      Follow Up Recommendations SNF;Supervision/Assistance - 24 hour (pt currently obs)    Equipment Recommendations  None recommended by PT    Recommendations for Other Services       Precautions / Restrictions Precautions Precautions: Fall Restrictions Weight Bearing Restrictions: No      Mobility  Bed Mobility Overal bed mobility: Needs Assistance Bed Mobility: Supine to Sit;Sit to Supine     Supine to sit: Min guard Sit to supine: Supervision   General bed mobility comments: incr time, min/guard to bring trunk to upright  Transfers Overall transfer level: Needs assistance   Transfers: Sit to/from Stand Sit to Stand:  Min assist         General transfer comment: pt unable to remain standing d/t c/o dizziness; unable to stand a second time to get orthostatics  Ambulation/Gait             General Gait Details: unable d/t dizziness  Stairs            Wheelchair Mobility    Modified Rankin (Stroke Patients Only)       Balance Overall balance assessment: Needs assistance;History of Falls   Sitting balance-Leahy Scale: Fair     Standing balance support: No upper extremity supported;Single extremity supported Standing balance-Leahy Scale: Poor Standing balance comment: pt requires assist to maintain static standing; pt has had multiple falls per dtr with the last one being at [facility] where she injured her elbow                             Pertinent Vitals/Pain Pain Assessment: Faces Faces Pain Scale: Hurts whole lot Pain Location: abd and chest  Pain Descriptors / Indicators: Grimacing;Sore Pain Intervention(s): Patient requesting pain meds-RN notified;Monitored during session    Home Living Family/patient expects to be discharged to:: Private residence Living Arrangements: Alone   Type of Home: House       Home Layout: One level Home Equipment: None Additional Comments: pt dtr and son in law do errands, grocery shopping etc;    Prior Function Level of Independence: Independent         Comments: doesn't drive     Hand Dominance  Extremity/Trunk Assessment   Upper Extremity Assessment Upper Extremity Assessment: Defer to OT evaluation    Lower Extremity Assessment Lower Extremity Assessment: Generalized weakness       Communication   Communication: No difficulties  Cognition Arousal/Alertness: Awake/alert Behavior During Therapy: WFL for tasks assessed/performed Overall Cognitive Status: History of cognitive impairments - at baseline Area of Impairment: Orientation;Memory;Following commands                 Orientation Level:  Disoriented to;Situation   Memory: Decreased short-term memory Following Commands: Follows one step commands consistently;Follows multi-step commands inconsistently              General Comments      Exercises     Assessment/Plan    PT Assessment Patient needs continued PT services  PT Problem List Decreased strength;Decreased activity tolerance;Decreased balance;Decreased cognition;Decreased mobility;Decreased safety awareness       PT Treatment Interventions DME instruction;Gait training;Functional mobility training;Therapeutic exercise;Therapeutic activities    PT Goals (Current goals can be found in the Care Plan section)  Acute Rehab PT Goals PT Goal Formulation: Patient unable to participate in goal setting Time For Goal Achievement: 04/26/16 Potential to Achieve Goals: Good    Frequency Min 3X/week   Barriers to discharge        Co-evaluation               End of Session Equipment Utilized During Treatment: Gait belt Activity Tolerance: Patient limited by fatigue;Other (comment) (dizziness) Patient left: in bed;with call bell/phone within reach;with family/visitor present;with bed alarm set Nurse Communication: Mobility status PT Visit Diagnosis: Unsteadiness on feet (R26.81)    Time: 4098-1191 PT Time Calculation (min) (ACUTE ONLY): 21 min   Charges:   PT Evaluation $PT Eval Moderate Complexity: 1 Procedure     PT G Codes:   PT G-Codes **NOT FOR INPATIENT CLASS** Functional Assessment Tool Used: AM-PAC 6 Clicks Basic Mobility;Clinical judgement Functional Limitation: Changing and maintaining body position Changing and Maintaining Body Position Current Status (Y7829): At least 20 percent but less than 40 percent impaired, limited or restricted Changing and Maintaining Body Position Goal Status (F6213): At least 1 percent but less than 20 percent impaired, limited or restricted    Drucilla Chalet, PT Pager:  [phone] 04/19/2016   Drucilla Chalet 04/19/2016, 2:08 PM

## 2016-04-20 ENCOUNTER — Encounter (HOSPITAL_COMMUNITY): Payer: Self-pay | Admitting: Certified Registered Nurse Anesthetist

## 2016-04-20 ENCOUNTER — Inpatient Hospital Stay (HOSPITAL_COMMUNITY): Payer: Medicare Other

## 2016-04-20 ENCOUNTER — Inpatient Hospital Stay (HOSPITAL_COMMUNITY): Payer: Medicare Other | Admitting: Certified Registered Nurse Anesthetist

## 2016-04-20 ENCOUNTER — Encounter (HOSPITAL_COMMUNITY)
Admission: EM | Disposition: A | Discharge status: Home / Self Care | Payer: Self-pay | Source: Home / Self Care | Attending: Internal Medicine

## 2016-04-20 DIAGNOSIS — G308 Other Alzheimer's disease: Secondary | ICD-10-CM

## 2016-04-20 DIAGNOSIS — K565 Intestinal adhesions [bands], unspecified as to partial versus complete obstruction: Principal | ICD-10-CM

## 2016-04-20 HISTORY — PX: LAPAROTOMY: SHX154

## 2016-04-20 LAB — COMPREHENSIVE METABOLIC PANEL
ALT: 8 U/L — ABNORMAL LOW (ref 14–54)
ANION GAP: 6 (ref 5–15)
AST: 12 U/L — ABNORMAL LOW (ref 15–41)
Albumin: 3.2 g/dL — ABNORMAL LOW (ref 3.5–5.0)
Alkaline Phosphatase: 39 U/L (ref 38–126)
BILIRUBIN TOTAL: 0.5 mg/dL (ref 0.3–1.2)
BUN: 36 mg/dL — ABNORMAL HIGH (ref 6–20)
CO2: 23 mmol/L (ref 22–32)
Calcium: 8.7 mg/dL — ABNORMAL LOW (ref 8.9–10.3)
Chloride: 107 mmol/L (ref 101–111)
Creatinine, Ser: 1.53 mg/dL — ABNORMAL HIGH (ref 0.44–1.00)
GFR, EST AFRICAN AMERICAN: 35 mL/min — AB (ref >60)
GFR, EST NON AFRICAN AMERICAN: 30 mL/min — AB (ref >60)
Glucose, Bld: 245 mg/dL — ABNORMAL HIGH (ref 65–99)
POTASSIUM: 4.6 mmol/L (ref 3.5–5.1)
Sodium: 136 mmol/L (ref 135–145)
TOTAL PROTEIN: 6.3 g/dL — AB (ref 6.5–8.1)

## 2016-04-20 LAB — GLUCOSE, CAPILLARY
GLUCOSE-CAPILLARY: 245 mg/dL — AB (ref 65–99)
Glucose-Capillary: 203 mg/dL — ABNORMAL HIGH (ref 65–99)
Glucose-Capillary: 166 mg/dL — ABNORMAL HIGH (ref 65–99)
Glucose-Capillary: 211 mg/dL — ABNORMAL HIGH (ref 65–99)

## 2016-04-20 LAB — TYPE AND SCREEN
ABO/RH(D): A POS
ANTIBODY SCREEN: NEGATIVE

## 2016-04-20 LAB — CBC WITH DIFFERENTIAL/PLATELET
Basophils Absolute: 0 10*3/uL (ref 0.0–0.1)
Basophils Relative: 0 %
EOS PCT: 0 %
Eosinophils Absolute: 0 10*3/uL (ref 0.0–0.7)
HEMATOCRIT: 39.1 % (ref 36.0–46.0)
Hemoglobin: 13.2 g/dL (ref 12.0–15.0)
LYMPHS PCT: 7 %
Lymphs Abs: 1 10*3/uL (ref 0.7–4.0)
MCH: 29.4 pg (ref 26.0–34.0)
MCHC: 33.8 g/dL (ref 30.0–36.0)
MCV: 87.1 fL (ref 78.0–100.0)
MONO ABS: 2.3 10*3/uL — AB (ref 0.1–1.0)
MONOS PCT: 16 %
NEUTROS ABS: 11.5 10*3/uL — AB (ref 1.7–7.7)
Neutrophils Relative %: 77 %
PLATELETS: 228 10*3/uL (ref 150–400)
RBC: 4.49 MIL/uL (ref 3.87–5.11)
RDW: 13.9 % (ref 11.5–15.5)
WBC: 14.9 10*3/uL — ABNORMAL HIGH (ref 4.0–10.5)

## 2016-04-20 LAB — MAGNESIUM: MAGNESIUM: 2.1 mg/dL (ref 1.7–2.4)

## 2016-04-20 LAB — ABO/RH: ABO/RH(D): A POS

## 2016-04-20 LAB — LACTIC ACID, PLASMA: LACTIC ACID, VENOUS: 1.6 mmol/L (ref 0.5–1.9)

## 2016-04-20 LAB — TSH: TSH: 0.807 u[IU]/mL (ref 0.350–4.500)

## 2016-04-20 SURGERY — LAPAROTOMY, EXPLORATORY
Anesthesia: General | Site: Abdomen

## 2016-04-20 MED ORDER — FENTANYL CITRATE (PF) 100 MCG/2ML IJ SOLN
INTRAMUSCULAR | Status: DC | PRN
  Administered 2016-04-20 (×2): 25 ug via INTRAVENOUS
  Administered 2016-04-20: 50 ug via INTRAVENOUS

## 2016-04-20 MED ORDER — SUGAMMADEX SODIUM 200 MG/2ML IV SOLN
INTRAVENOUS | Status: AC
  Filled 2016-04-20: qty 2

## 2016-04-20 MED ORDER — LACTATED RINGERS IV SOLN
INTRAVENOUS | Status: DC | PRN
  Administered 2016-04-20 (×2): via INTRAVENOUS

## 2016-04-20 MED ORDER — PHENYLEPHRINE 40 MCG/ML (10ML) SYRINGE FOR IV PUSH (FOR BLOOD PRESSURE SUPPORT)
PREFILLED_SYRINGE | INTRAVENOUS | Status: AC
  Filled 2016-04-20: qty 10

## 2016-04-20 MED ORDER — FENTANYL CITRATE (PF) 100 MCG/2ML IJ SOLN
INTRAMUSCULAR | Status: AC
  Administered 2016-04-20: 14:00:00
  Filled 2016-04-20: qty 2

## 2016-04-20 MED ORDER — ONDANSETRON HCL 4 MG/2ML IJ SOLN
INTRAMUSCULAR | Status: AC
  Filled 2016-04-20: qty 2

## 2016-04-20 MED ORDER — ACETAMINOPHEN 10 MG/ML IV SOLN
1000.0000 mg | Freq: Once | INTRAVENOUS | Status: AC
  Administered 2016-04-20: 1000 mg via INTRAVENOUS

## 2016-04-20 MED ORDER — CHLORHEXIDINE GLUCONATE CLOTH 2 % EX PADS: 6.0000 | MEDICATED_PAD | Freq: Once | CUTANEOUS | Status: DC

## 2016-04-20 MED ORDER — PROPOFOL 10 MG/ML IV BOLUS
INTRAVENOUS | Status: DC | PRN
  Administered 2016-04-20: 100 mg via INTRAVENOUS

## 2016-04-20 MED ORDER — PHENYLEPHRINE HCL 10 MG/ML IJ SOLN
INTRAMUSCULAR | Status: DC | PRN
  Administered 2016-04-20 (×6): 80 ug via INTRAVENOUS
  Administered 2016-04-20: 60 ug via INTRAVENOUS

## 2016-04-20 MED ORDER — DEXAMETHASONE SODIUM PHOSPHATE 10 MG/ML IJ SOLN
INTRAMUSCULAR | Status: AC
  Filled 2016-04-20: qty 1

## 2016-04-20 MED ORDER — ROCURONIUM BROMIDE 50 MG/5ML IV SOSY
PREFILLED_SYRINGE | INTRAVENOUS | Status: DC | PRN
  Administered 2016-04-20: 30 mg via INTRAVENOUS
  Administered 2016-04-20: 10 mg via INTRAVENOUS

## 2016-04-20 MED ORDER — ACETAMINOPHEN 10 MG/ML IV SOLN
INTRAVENOUS | Status: AC
  Administered 2016-04-20: 15:00:00
  Filled 2016-04-20: qty 100

## 2016-04-20 MED ORDER — KCL IN DEXTROSE-NACL 20-5-0.45 MEQ/L-%-% IV SOLN
INTRAVENOUS | Status: DC
  Administered 2016-04-20 – 2016-04-21 (×2): via INTRAVENOUS
  Filled 2016-04-20 (×2): qty 1000

## 2016-04-20 MED ORDER — HYDROMORPHONE HCL 1 MG/ML IJ SOLN
1.0000 mg | INTRAMUSCULAR | Status: DC | PRN
  Administered 2016-04-20: 2 mg via INTRAVENOUS
  Administered 2016-04-21: 1 mg via INTRAVENOUS
  Administered 2016-04-21: 2 mg via INTRAVENOUS
  Administered 2016-04-21 (×2): 1 mg via INTRAVENOUS
  Administered 2016-04-21 – 2016-04-22 (×4): 2 mg via INTRAVENOUS
  Filled 2016-04-20: qty 1
  Filled 2016-04-20: qty 2
  Filled 2016-04-20: qty 1
  Filled 2016-04-20 (×2): qty 2
  Filled 2016-04-20: qty 1
  Filled 2016-04-20 (×3): qty 2

## 2016-04-20 MED ORDER — CHLORHEXIDINE GLUCONATE CLOTH 2 % EX PADS
6.0000 | MEDICATED_PAD | Freq: Once | CUTANEOUS | Status: DC
  Administered 2016-04-20: 6 via TOPICAL

## 2016-04-20 MED ORDER — FENTANYL CITRATE (PF) 100 MCG/2ML IJ SOLN
25.0000 ug | INTRAMUSCULAR | Status: DC | PRN
  Administered 2016-04-20 (×2): 25 ug via INTRAVENOUS

## 2016-04-20 MED ORDER — LACTATED RINGERS IV SOLN
INTRAVENOUS | Status: DC | PRN
Start: 2016-04-20 — End: 2016-04-20
  Administered 2016-04-20: 12:00:00 via INTRAVENOUS

## 2016-04-20 MED ORDER — FENTANYL CITRATE (PF) 250 MCG/5ML IJ SOLN
INTRAMUSCULAR | Status: AC
  Filled 2016-04-20: qty 5

## 2016-04-20 MED ORDER — SODIUM CHLORIDE 0.9 % IR SOLN
Status: DC | PRN
  Administered 2016-04-20 (×2): 1000 mL

## 2016-04-20 MED ORDER — SUCCINYLCHOLINE CHLORIDE 200 MG/10ML IV SOSY
PREFILLED_SYRINGE | INTRAVENOUS | Status: AC
  Filled 2016-04-20: qty 10

## 2016-04-20 MED ORDER — PANTOPRAZOLE SODIUM 40 MG IV SOLR
40.0000 mg | Freq: Every day | INTRAVENOUS | Status: DC
  Administered 2016-04-20 – 2016-04-22 (×3): 40 mg via INTRAVENOUS
  Filled 2016-04-20 (×2): qty 40

## 2016-04-20 MED ORDER — ONDANSETRON HCL 4 MG/2ML IJ SOLN
INTRAMUSCULAR | Status: DC | PRN
  Administered 2016-04-20: 4 mg via INTRAVENOUS

## 2016-04-20 MED ORDER — ORAL CARE MOUTH RINSE
15.0000 mL | Freq: Two times a day (BID) | OROMUCOSAL | Status: DC
  Administered 2016-04-20 – 2016-04-24 (×7): 15 mL via OROMUCOSAL

## 2016-04-20 MED ORDER — ROCURONIUM BROMIDE 50 MG/5ML IV SOSY
PREFILLED_SYRINGE | INTRAVENOUS | Status: AC
  Filled 2016-04-20: qty 5

## 2016-04-20 MED ORDER — ONDANSETRON HCL 4 MG/2ML IJ SOLN
4.0000 mg | Freq: Four times a day (QID) | INTRAMUSCULAR | Status: DC | PRN
  Administered 2016-04-25: 4 mg via INTRAVENOUS
  Filled 2016-04-20: qty 2

## 2016-04-20 MED ORDER — SUCCINYLCHOLINE CHLORIDE 200 MG/10ML IV SOSY
PREFILLED_SYRINGE | INTRAVENOUS | Status: DC | PRN
Start: 2016-04-20 — End: 2016-04-20
  Administered 2016-04-20: 100 mg via INTRAVENOUS

## 2016-04-20 MED ORDER — LIDOCAINE 2% (20 MG/ML) 5 ML SYRINGE
INTRAMUSCULAR | Status: DC | PRN
  Administered 2016-04-20: 50 mg via INTRAVENOUS

## 2016-04-20 MED ORDER — LIDOCAINE 2% (20 MG/ML) 5 ML SYRINGE
INTRAMUSCULAR | Status: AC
Start: 2016-04-20 — End: 2016-04-20
  Filled 2016-04-20: qty 5

## 2016-04-20 MED ORDER — DEXAMETHASONE SODIUM PHOSPHATE 10 MG/ML IJ SOLN
INTRAMUSCULAR | Status: DC | PRN
  Administered 2016-04-20: 5 mg via INTRAVENOUS

## 2016-04-20 MED ORDER — ONDANSETRON 4 MG PO TBDP: 4.0000 mg | ORAL_TABLET | Freq: Four times a day (QID) | ORAL | Status: DC | PRN

## 2016-04-20 MED ORDER — SUGAMMADEX SODIUM 200 MG/2ML IV SOLN
INTRAVENOUS | Status: DC | PRN
  Administered 2016-04-20: 150 mg via INTRAVENOUS

## 2016-04-20 MED ORDER — PROPOFOL 10 MG/ML IV BOLUS
INTRAVENOUS | Status: AC
  Filled 2016-04-20: qty 20

## 2016-04-20 MED ORDER — SODIUM CHLORIDE 0.9 % IV SOLN
1.0000 g | INTRAVENOUS | Status: AC
  Administered 2016-04-20: 1 g via INTRAVENOUS
  Filled 2016-04-20: qty 1

## 2016-04-20 MED ORDER — ENOXAPARIN SODIUM 30 MG/0.3ML ~~LOC~~ SOLN
30.0000 mg | SUBCUTANEOUS | Status: DC
Start: 2016-04-21 — End: 2016-04-22
  Administered 2016-04-21: 30 mg via SUBCUTANEOUS
  Filled 2016-04-20: qty 0.3

## 2016-04-20 SURGICAL SUPPLY — 37 items
APPLICATOR COTTON TIP 6IN STRL (MISCELLANEOUS) ×3 IMPLANT
BLADE EXTENDED COATED 6.5IN (ELECTRODE) ×2 IMPLANT
BLADE HEX COATED 2.75 (ELECTRODE) ×3 IMPLANT
CHLORAPREP W/TINT 26ML (MISCELLANEOUS) ×3 IMPLANT
COVER MAYO STAND STRL (DRAPES) IMPLANT
COVER SURGICAL LIGHT HANDLE (MISCELLANEOUS) ×3 IMPLANT
DRAPE LAPAROSCOPIC ABDOMINAL (DRAPES) ×3 IMPLANT
DRAPE WARM FLUID 44X44 (DRAPE) IMPLANT
DRSG OPSITE POSTOP 4X12 (GAUZE/BANDAGES/DRESSINGS) ×2 IMPLANT
ELECT REM PT RETURN 15FT ADLT (MISCELLANEOUS) ×3 IMPLANT
GAUZE SPONGE 4X4 12PLY STRL (GAUZE/BANDAGES/DRESSINGS) ×3 IMPLANT
GLOVE BIOGEL PI IND STRL 7.0 (GLOVE) ×1 IMPLANT
GLOVE BIOGEL PI INDICATOR 7.0 (GLOVE) ×2
GLOVE SURG ORTHO 8.0 STRL STRW (GLOVE) ×3 IMPLANT
GOWN STRL REUS W/TWL LRG LVL3 (GOWN DISPOSABLE) ×3 IMPLANT
GOWN STRL REUS W/TWL XL LVL3 (GOWN DISPOSABLE) ×6 IMPLANT
HANDLE SUCTION POOLE (INSTRUMENTS) IMPLANT
KIT BASIN OR (CUSTOM PROCEDURE TRAY) ×3 IMPLANT
NS IRRIG 1000ML POUR BTL (IV SOLUTION) ×3 IMPLANT
PACK GENERAL/GYN (CUSTOM PROCEDURE TRAY) ×3 IMPLANT
SPONGE LAP 18X18 X RAY DECT (DISPOSABLE) IMPLANT
STAPLER VISISTAT 35W (STAPLE) ×3 IMPLANT
SUCTION POOLE HANDLE (INSTRUMENTS) ×3
SUT NOV 1 T60/GS (SUTURE) IMPLANT
SUT SILK 2 0 (SUTURE) ×3
SUT SILK 2 0 SH CR/8 (SUTURE) ×2 IMPLANT
SUT SILK 2-0 18XBRD TIE 12 (SUTURE) IMPLANT
SUT SILK 3 0 (SUTURE) ×3
SUT SILK 3 0 SH CR/8 (SUTURE) ×2 IMPLANT
SUT SILK 3-0 18XBRD TIE 12 (SUTURE) IMPLANT
SUT VICRYL 2 0 18  UND BR (SUTURE)
SUT VICRYL 2 0 18 UND BR (SUTURE) IMPLANT
TOWEL OR 17X26 10 PK STRL BLUE (TOWEL DISPOSABLE) ×6 IMPLANT
TRAY FOLEY W/METER SILVER 14FR (SET/KITS/TRAYS/PACK) ×2 IMPLANT
TRAY FOLEY W/METER SILVER 16FR (SET/KITS/TRAYS/PACK) IMPLANT
WATER STERILE IRR 1500ML POUR (IV SOLUTION) ×3 IMPLANT
YANKAUER SUCT BULB TIP NO VENT (SUCTIONS) IMPLANT

## 2016-04-20 NOTE — Op Note (Signed)
NAMEarley Carr:  Line, Valerie Carr              ACCOUNT NO.:  [id number]  MEDICAL RECORD NO.:  [id number]  LOCATION:  1505                         [facility]  PHYSICIAN:  Velora Hecklerodd M Allina Riches, MD      DATE OF BIRTH:  1929/11/05  DATE OF PROCEDURE:  04/20/2016                              OPERATIVE REPORT   PREOPERATIVE DIAGNOSIS:  Small bowel obstruction.  POSTOPERATIVE DIAGNOSIS:  Small bowel obstruction, ischemic and infarcted small bowel.  PROCEDURE: 1. Exploratory laparotomy. 2. Lysis of adhesions. 3. Small bowel resection (proximal ileum).  SURGEON:  Velora Hecklerodd M. Jonathon Tan, M.D.  ASSISTANT:  Adolph Pollackodd J. Rosenbower, M.D.  ANESTHESIA:  General.  ESTIMATED BLOOD LOSS:  Minimal.  PREPARATION:  ChloraPrep.  COMPLICATIONS:  None.  INDICATIONS:  The patient is an 83 year old white female, admitted to the medical service with signs and symptoms of small bowel obstruction. The patient failed to improve with conservative management.  The patient developed leukocytosis and increasing abdominal pain.  Abdominal x-ray showed progressive distended loops of small bowel.  The patient was therefore prepared urgently and brought to the operating room for exploration.  DESCRIPTION OF PROCEDURE:  [facility]  The patient was brought to the operating room, placed in supine position on the operating room table.  Following administration of general anesthesia, the patient was positioned and then prepped and draped in the usual aseptic fashion.  After ascertaining that an adequate level of anesthesia had been achieved, a lower midline abdominal incision was made with a #10 blade.  Dissection was carried through subcutaneous tissues.  Fascia was incised in the midline and the peritoneal cavity was entered cautiously.  There was blood-tinged ascites fluid present within the abdomen.  Approximately 1 L of ascites was evacuated.  The bowel appeared ischemic.   Omental adhesions to the pelvis were lysed sharply with use of the electrocautery.  Small bowel was mobilized.  There were 3 tight-band like adhesions between the omentum and the pelvis between appendix epiploica on the transverse colon and sigmoid colon.  These were all lysed allowing for release of the small intestine.  The small intestine was then run from the ligament of Treitz to the ileocecal valve.  There were 3 points along the small bowel, where there had been compression. There was venous thrombosis along a section of small bowel over approximately 24 inches in length.  Decision was made to resect the ischemic and possibly infarcted segment of proximal ileum measuring approximately 24 inches in length.  Therefore, the bowel was transected proximally and distally to this area with GIA staplers.  The mesentery was divided with the EnSeal.  The entire specimen was resected and submitted to Pathology for review.  The proximal and distal ends of the small bowel appeared quite viable. A side-to-side functionally end-to-end anastomosis was created using the GIA stapler.  Enterotomy was closed with a TA-60 stapler.  Mesenteric defect was closed with interrupted 2-0 silk sutures.  Good hemostasis was noted.  Abdomen was copiously irrigated with warm saline, which was evacuated. Omentum was used to cover the anastomosis and small bowel.  Midline incision was closed with interrupted #1 Novafil  simple sutures. Subcutaneous tissues were irrigated.  Skin was closed with stainless steel staples.  Wound was washed and dried and a honey-comb dressing was applied.  The patient was awakened from anesthesia and brought to the recovery room in stable condition.  The patient tolerated the procedure well.   Velora Heckler, MD, [facility] Office: [phone]    TMG/MEDQ  D:  04/20/2016  T:  04/20/2016  Job:  [id number]  cc:   Velora Heckler, MD [address]  [place] [address]

## 2016-04-20 NOTE — Progress Notes (Signed)
General Surgery [facility]  Patient seen and examined this AM.  Worsening SBO with elevated WBC.  Plan ex lap with lysis of adhesions, possible bowel resection.  Discussed by telephone with patient's daughter, [name], who is POA.  [phone].  She is on her way to hospital and will sign consent.  Discussed with Dr. Roda ShuttersXu who agrees with plans for OR.  OR notified and orders entered.  The risks and benefits of the procedure have been discussed at length with the patient.  The patient understands the proposed procedure, potential alternative treatments, and the course of recovery to be expected.  All of the patient's questions have been answered at this time.  The patient wishes to proceed with surgery.  Velora Hecklerodd M. Carry Ortez, MD, [facility] Office: [phone]

## 2016-04-20 NOTE — Progress Notes (Signed)
Pt. Admitted from PACU with NG tube placed. Pt. Removed NG. RN notified surgery. RN replaced NG per MD verbal order and placed to LIS per MD verbal order. RN will continue to monitor.

## 2016-04-20 NOTE — Anesthesia Procedure Notes (Signed)
Procedure Name: Intubation Date/Time: 04/20/2016 12:22 PM Performed by: West Pugh Pre-anesthesia Checklist: Patient identified, Emergency Drugs available, Suction available, Patient being monitored and Timeout performed Patient Re-evaluated:Patient Re-evaluated prior to inductionOxygen Delivery Method: Circle system utilized Preoxygenation: Pre-oxygenation with 100% oxygen Intubation Type: IV induction, Rapid sequence and Cricoid Pressure applied Ventilation: Mask ventilation without difficulty Laryngoscope Size: Mac and 3 Grade View: Grade I Tube type: Subglottic suction tube Tube size: 7.0 mm Number of attempts: 1 Airway Equipment and Method: Stylet Placement Confirmation: ETT inserted through vocal cords under direct vision,  positive ETCO2,  CO2 detector and breath sounds checked- equal and bilateral Tube secured with: Tape Dental Injury: Teeth and Oropharynx as per pre-operative assessment

## 2016-04-20 NOTE — Transfer of Care (Signed)
Immediate Anesthesia Transfer of Care Note  Patient: Valerie Carr  Procedure(s) Performed: Procedure(s): EXPLORATORY LAPAROTOMY, SMALL BOWEL RESECTION, LYSIS OF ADHESIONS (N/A)  Patient Location: PACU  Anesthesia Type:General  Level of Consciousness:  sedated, patient cooperative and responds to stimulation  Airway & Oxygen Therapy:Patient Spontanous Breathing and Patient connected to face mask oxgen  Post-op Assessment:  Report given to PACU RN and Post -op Vital signs reviewed and stable  Post vital signs:  Reviewed and stable  Last Vitals:  Vitals:   04/20/16 0507 04/20/16 1135  BP: 113/66 (!) 128/53  Pulse: 90 72  Resp: 18 18  Temp: 36.6 C 36.1 C    Complications: No apparent anesthesia complications

## 2016-04-20 NOTE — Anesthesia Preprocedure Evaluation (Addendum)
Anesthesia Evaluation  Patient identified by MRN, date of birth, ID band Patient confused    Reviewed: Allergy & Precautions, Patient's Chart, lab work & pertinent test results  Airway Mallampati: I  TM Distance: >3 FB Neck ROM: Full    Dental  (+) Edentulous Upper, Edentulous Lower   Pulmonary former smoker,    breath sounds clear to auscultation       Cardiovascular hypertension, + CAD, + CABG and + Peripheral Vascular Disease  + dysrhythmias Atrial Fibrillation + pacemaker  Rhythm:Regular Rate:Normal  Sick sinus syndrome   Neuro/Psych PSYCHIATRIC DISORDERS Anxiety Dementia   GI/Hepatic negative GI ROS, Neg liver ROS,   Endo/Other  diabetes, Type 2, Oral Hypoglycemic Agents  Renal/GU negative Renal ROS  negative genitourinary   Musculoskeletal negative musculoskeletal ROS (+)   Abdominal   Peds negative pediatric ROS (+)  Hematology negative hematology ROS (+)   Anesthesia Other Findings   Reproductive/Obstetrics negative OB ROS                           Lab Results  Component Value Date   WBC 14.9 (H) 04/20/2016   HGB 13.2 04/20/2016   HCT 39.1 04/20/2016   MCV 87.1 04/20/2016   PLT 228 04/20/2016   Lab Results  Component Value Date   CREATININE 1.53 (H) 04/20/2016   BUN 36 (H) 04/20/2016   NA 136 04/20/2016   K 4.6 04/20/2016   CL 107 04/20/2016   CO2 23 04/20/2016   Lab Results  Component Value Date   INR 0.9 01/06/2011   EKG: Atrial paced.  Normal Echo in 2012. No recent studies.  Anesthesia Physical Anesthesia Plan  ASA: III and emergent  Anesthesia Plan: General   Post-op Pain Management:    Induction: Intravenous  Airway Management Planned: Oral ETT  Additional Equipment:   Intra-op Plan:   Post-operative Plan: Possible Post-op intubation/ventilation  Informed Consent: I have reviewed the patients History and Physical, chart, labs and discussed the  procedure including the risks, benefits and alternatives for the proposed anesthesia with the patient or authorized representative who has indicated his/her understanding and acceptance.   Dental advisory given  Plan Discussed with:   Anesthesia Plan Comments: (Magnet in room)        Anesthesia Quick Evaluation

## 2016-04-20 NOTE — Progress Notes (Addendum)
PROGRESS NOTE  Valerie Carr ZOX:[id number] DOB: 12-11-1929 DOA: 04/18/2016 PCP: [name],[name], MD  HPI/Recap of past 24 hours:  Worsening abdominal pain, no vomiting overnight, no bm /no flatus  Not able to elicit detailed history due to dementia,  no fever, wbc remain elevated, cr increasing  Assessment/Plan: Active Problems:   Alzheimer's disease   Sick sinus syndrome (HCC)   Chronic atrial fibrillation (HCC)   Cardiac pacemaker in situ   Coronary artery disease involving coronary bypass graft of native heart without angina pectoris   HLD (hyperlipidemia)   Essential hypertension   Carotid artery stenosis, asymptomatic   SBO (small bowel obstruction)  Abdominal pain, ?small bowel obstruction (h/o abdominal hysterectomy, appendectomy) abb x ray on 3/24 "Persistent mildly dilated small bowel loops in the left pelvis, suggesting persistent partial mid to distal small bowel obstruction" wbc elevated, Remain on ivf, remain npo, getting daily ab x ray, kub with multiple dilated bowel, she has worsening of ab pain To OR today, appreciate General surgery Dr Ardine EngGerkin's  input  Cholelithiasis on CTAb/pel:  lft wnl  AKI on Chronic kidney disease stage III -Patient's GFR has been in stage III range at least since 2015,  - ua does showed glucose, but no infection Worsening of cr likely from sbo, dehydration, renal dosing meds  Coronary artery disease status post CABG -No chest pain, this appears to be stable, currently npo, oral meds held  Sick sinus syndrome/A. fib status post Maze procedure and now with pacemaker -Monitor on telemetry., her EKG shows paced rhythm. -Not on anticoagulation probably due to age/dementia, she is on full dose aspirin.  Will hold that for now in case she needs surgical intervention - CHADSVASC score at least 2  Hypertension -Resume home medications when able to take oral meds, placed on IV hydralazine as needed if she is unable to take  p.o.  noninsulin dependent Diabetes mellitus type II -She is on metformin at home, place patient on sliding scale while here.    Abdominal aortic aneurysm to max of 4.2 cm -This was present at least on a CT scan dated in 2010, so doubt acute or new  Alzheimer's dementia -She has underlying confusion, (not oriented to time), no behavioral disturbances noted, continue home medications   Severe malnutrition: Daughter report significant weight loss in the last few months 15pounds Will ask nutrition to see if patient can take oral  FTT/frequent falls: she has lived at a independent living for the past 9years, will need higher level of care  I have discussed with daughter if patient does not improved, may consider palliative care/comfort measures, she agrees to it, but hopefully, patient will be able to improve and able to eat.     Code Status: DNR  Family Communication: patient   Disposition Plan: pending   Consultants:  General surgery  Procedures:  Daily abdominal x ray  Ex lap/LOA on 3/25  Antibiotics:  perioperative   Objective: BP 113/66 (BP Location: Left Arm)   Pulse 90   Temp 97.8 F (36.6 C) (Oral)   Resp 18   Ht 5\' 2"  (1.575 m)   Wt 58.1 kg (128 lb)   SpO2 96%   BMI 23.41 kg/m   Intake/Output Summary (Last 24 hours) at 04/20/16 1025 Last data filed at 04/20/16 0700  Gross per 24 hour  Intake              625 ml  Output  0 ml  Net              625 ml   Filed Weights   04/18/16 0924  Weight: 58.1 kg (128 lb)    Exam:   General:  Frail, demented, c/o worsening of abdominal pain  Cardiovascular: paced rhythm  Respiratory: CTABL  Abdomen: diffuse abdominal tenderness, guarding, decreased bowel sounds,  Musculoskeletal: No Edema  Neuro: demented   Data Reviewed: Basic Metabolic Panel:  Recent Labs Lab 04/16/16 1541 04/18/16 1011 04/19/16 0529 04/20/16 0517  NA 141 141 137 136  K 3.6 3.0* 3.9 4.6  CL 102  107 106 107  CO2  --  26 21* 23  GLUCOSE 117* 216* 324* 245*  BUN 30* 25* 26* 36*  CREATININE 1.40* 1.16* 1.12* 1.53*  CALCIUM  --  9.4 8.5* 8.7*  MG  --   --   --  2.1   Liver Function Tests:  Recent Labs Lab 04/18/16 1011 04/19/16 0529 04/20/16 0517  AST 18 17 12*  ALT 13* 10* 8*  ALKPHOS 44 40 39  BILITOT 0.7 0.7 0.5  PROT 8.2* 6.8 6.3*  ALBUMIN 4.7 3.5 3.2*    Recent Labs Lab 04/18/16 1011  LIPASE 38   No results for input(s): AMMONIA in the last 168 hours. CBC:  Recent Labs Lab 04/16/16 1541 04/18/16 1011 04/19/16 0529 04/20/16 0517  WBC  --  6.0 15.9* 14.9*  NEUTROABS  --  5.1  --  11.5*  HGB 12.9 12.5 14.4 13.2  HCT 38.0 36.5 41.8 39.1  MCV  --  85.7 84.6 87.1  PLT  --  178 278 228   Cardiac Enzymes:   No results for input(s): CKTOTAL, CKMB, CKMBINDEX, TROPONINI in the last 168 hours. BNP (last 3 results) No results for input(s): BNP in the last 8760 hours.  ProBNP (last 3 results) No results for input(s): PROBNP in the last 8760 hours.  CBG:  Recent Labs Lab 04/19/16 0743 04/19/16 1148 04/19/16 1656 04/19/16 2122 04/20/16 0723  GLUCAP 285* 232* 208* 151* 245*    Recent Results (from the past 240 hour(s))  MRSA PCR Screening     Status: None   Collection Time: 04/18/16  3:17 PM  Result Value Ref Range Status   MRSA by PCR NEGATIVE NEGATIVE Final    Comment:        The GeneXpert MRSA Assay (FDA approved for NASAL specimens only), is one component of a comprehensive MRSA colonization surveillance program. It is not intended to diagnose MRSA infection nor to guide or monitor treatment for MRSA infections.      Studies: Dg Abd 2 Views  Result Date: 04/20/2016 CLINICAL DATA:  Abdominal pain.  Recent small bowel obstruction. EXAM: ABDOMEN - 2 VIEW COMPARISON:  CT abdomen and pelvis April 18, 2016; abdominal radiographs April 19, 2016 FINDINGS: Supine and left lateral decubitus images were obtained. Multiple loops of dilated bowel  remained. There are occasional air-fluid levels. No free air. Sizable gallstone noted in the gallbladder region. There is new calcification in the aorta with abdominal aortic distention as noted on recent CT. IMPRESSION: The bowel gas pattern is consistent with a degree of persistent bowel obstruction. No free air. Gallstone present. Aortic aneurysm, better delineated on recent CT. Electronically Signed   By: Bretta Bang III M.D.   On: 04/20/2016 10:09    Scheduled Meds: . chlorhexidine  15 mL Mouth Rinse BID  . donepezil  10 mg Oral QHS  . DULoxetine  60  mg Oral BID  . insulin aspart  0-9 Units Subcutaneous TID WC  . irbesartan  37.5 mg Oral Daily  . mouth rinse  15 mL Mouth Rinse q12n4p  . memantine  10 mg Oral BID  . QUEtiapine  25 mg Oral q morning - 10a  . sodium chloride flush  3 mL Intravenous Q12H  . traZODone  100 mg Oral QHS    Continuous Infusions: . dextrose 5 % and 0.45 % NaCl with KCl 20 mEq/L 75 mL/hr at 04/20/16 0802     Time spent:  Landyn Buckalew MD, PhD  [facility] Pager [phone]. If 7PM-7AM, please contact night-coverage at [email or web address], password [facility] 04/20/2016, 10:25 AM  LOS: 1 day

## 2016-04-20 NOTE — Progress Notes (Signed)
General Surgery [facility]  Assessment & Plan:  Abdominal pain, small bowel obstruction             Patient with increased pain this AM             WBC at 14.9K this AM, afebrile             AXR just done - increased dilated loops of small bowel consistent with worsening obstruction             Will discuss operative intervention and timing of surgery with patient's daughter this AM        Velora Heckler, MD, [facility]       Office: [phone]    Subjective: Patient complains of increased abd pain this AM.  Wants to eat - wants a 'Coke' to drink.  Objective: Vital signs in last 24 hours: Temp:  [97.5 F (36.4 C)-97.9 F (36.6 C)] 97.8 F (36.6 C) (03/25 0507) Pulse Rate:  [71-90] 90 (03/25 0507) Resp:  [18] 18 (03/25 0507) BP: (113-126)/(46-66) 113/66 (03/25 0507) SpO2:  [96 %-98 %] 96 % (03/25 0507) Last BM Date:  (pt rpts today, patient has memory impairment)  Intake/Output from previous day: 03/24 0701 - 03/25 0700 In: 625 [I.V.:625] Out: 250 [Urine:250] Intake/Output this shift: No intake/output data recorded.  Physical Exam: HEENT - sclerae clear, mucous membranes moist Neck - soft Chest - clear bilaterally Cor - RRR Abdomen - mild to moderate distension; BS present; mild diffuse tenderness; well healed lower midline wound Ext - no edema, non-tender Neuro - alert & oriented, no focal deficits  Lab Results:   Recent Labs  04/19/16 0529 04/20/16 0517  WBC 15.9* 14.9*  HGB 14.4 13.2  HCT 41.8 39.1  PLT 278 228   BMET  Recent Labs  04/19/16 0529 04/20/16 0517  NA 137 136  K 3.9 4.6  CL 106 107  CO2 21* 23  GLUCOSE 324* 245*  BUN 26* 36*  CREATININE 1.12* 1.53*  CALCIUM 8.5* 8.7*   PT/INR No results for input(s): LABPROT, INR in the last 72 hours. Comprehensive Metabolic Panel:    Component Value Date/Time   NA 136 04/20/2016 0517   NA 137 04/19/2016 0529   K 4.6 04/20/2016 0517    K 3.9 04/19/2016 0529   CL 107 04/20/2016 0517   CL 106 04/19/2016 0529   CO2 23 04/20/2016 0517   CO2 21 (L) 04/19/2016 0529   BUN 36 (H) 04/20/2016 0517   BUN 26 (H) 04/19/2016 0529   CREATININE 1.53 (H) 04/20/2016 0517   CREATININE 1.12 (H) 04/19/2016 0529   GLUCOSE 245 (H) 04/20/2016 0517   GLUCOSE 324 (H) 04/19/2016 0529   CALCIUM 8.7 (L) 04/20/2016 0517   CALCIUM 8.5 (L) 04/19/2016 0529   AST 12 (L) 04/20/2016 0517   AST 17 04/19/2016 0529   ALT 8 (L) 04/20/2016 0517   ALT 10 (L) 04/19/2016 0529   ALKPHOS 39 04/20/2016 0517   ALKPHOS 40 04/19/2016 0529   BILITOT 0.5 04/20/2016 0517   BILITOT 0.7 04/19/2016 0529   PROT 6.3 (L) 04/20/2016 0517   PROT 6.8 04/19/2016 0529   ALBUMIN 3.2 (L) 04/20/2016 0517   ALBUMIN 3.5 04/19/2016 0529    Studies/Results: Ct Abdomen Pelvis Wo Contrast  Result Date: 04/18/2016 CLINICAL DATA:  Generalized lower abdominal pain for 4 years, dementia, type II diabetes mellitus, hypertension, cholelithiasis, vascular disease, former smoker  EXAM: CT ABDOMEN AND PELVIS WITHOUT CONTRAST TECHNIQUE: Multidetector CT imaging of the abdomen and pelvis was performed following the standard protocol without IV contrast. Sagittal and coronal MPR images reconstructed from axial data set. COMPARISON:  None FINDINGS: Lower chest: Basilar emphysematous changes with minimal atelectasis at lingula and lower lobes. Hepatobiliary: 2.0 cm calcified gallstone in gallbladder. Liver and gallbladder otherwise normal appearance Pancreas: Atrophic, otherwise unremarkable Spleen: Normal appearance. Adrenals/Urinary Tract: Thickening of adrenal glands without discrete mass. Tiny calcifications a kidneys which are likely a combination of vascular calcifications and a tiny upper pole RIGHT renal calculus. No mass or hydronephrosis. Bladder and ureters unremarkable Stomach/Bowel: Appendix not visualized, by history surgically absent. Small amount retained high attenuation material in  RIGHT colon. Few sigmoid and descending diverticula without evidence of diverticulitis. Questionable rectal wall thickening versus artifact from underdistention Stomach unremarkable. Dilated small bowel loops in pelvis with decompressed distal small bowel loops compatible with small bowel obstruction question due to adhesion in the LEFT pelvis. Vascular/Lymphatic: No adenopathy. Atherosclerotic calcifications of the aorta, iliac arteries, and coronary arteries. Fusiform aneurysmal dilatation of the aorta at the aortic hiatus 4.0 cm diameter. Focal saccular aneurysmal dilatation at the anterior proximal abdominal aorta at the level of the celiac artery, 4.2 x 3.2 cm image 25. Aneurysmal dilatation of the mid to distal abdominal aorta measuring 4.0 x 3.8 cm. Aneurysmal dilatation of common iliac artery 17 mm LEFT and 14 mm RIGHT. Reproductive: Uterus surgically absent.  Ovaries not visualized. Other: No hernia or definite inflammatory process. No free air or free fluid. Musculoskeletal: Diffuse osseous demineralization. IMPRESSION: Aneurysmal dilatation of the abdominal aorta to 4.0 cm diameter at the aortic hiatus, 4.2 x 3.2 cm at the celiac artery, and a 4.0 x 3.8 cm at the mid to distal aorta. Aneurysmal dilatation of common iliac artery 17 mm LEFT and 14 mm RIGHT. Probable small bowel obstruction in the pelvis with dilated small bowel loops radiating from a point in the LEFT pelvis, question adhesion. Cannot exclude rectal wall thickening ; recommend proctoscopy to exclude rectal neoplasm. Cholelithiasis. Distal colonic diverticulosis. Electronically Signed   By: Ulyses SouthwardMark  Boles M.D.   On: 04/18/2016 11:58   Dg Abd 2 Views  Result Date: 04/19/2016 CLINICAL DATA:  Abdominal pain, small-bowel obstruction suspected on CT study from 1 day prior EXAM: ABDOMEN - 2 VIEW COMPARISON:  04/18/2016 CT abdomen/pelvis. FINDINGS: Persistent mildly dilated small bowel loops in the left pelvis measuring up to 3.2 cm diameter, not  appreciably changed. Mild colonic stool volume. No evidence of pneumatosis or pneumoperitoneum. Aneurysmal atherosclerotic abdominal aorta. Cholelithiasis. IMPRESSION: 1. Persistent mildly dilated small bowel loops in the left pelvis, suggesting persistent partial mid to distal small bowel obstruction. 2. No evidence of pneumatosis or pneumoperitoneum. 3. Cholelithiasis. 4. Aneurysmal atherosclerotic abdominal aorta. Electronically Signed   By: Delbert PhenixJason A Poff M.D.   On: 04/19/2016 09:46      Kenyatta Keidel M 04/20/2016  Patient ID: [name], female   DOB: June 30, 1929, 83 y.o.   MRN: [id number]

## 2016-04-20 NOTE — Brief Op Note (Signed)
04/18/2016 - 04/20/2016  1:37 PM  PATIENT:  Valerie Carr  83 y.o. female  PRE-OPERATIVE DIAGNOSIS:  small bowel obstruction  POST-OPERATIVE DIAGNOSIS:  ischemic and infarcted small bowel with obstruction  PROCEDURE:  Procedure(s): EXPLORATORY LAPAROTOMY, SMALL BOWEL RESECTION, LYSIS OF ADHESIONS (N/A)  SURGEON:  Surgeon(s) and Role:    * Darnell Levelodd Rielyn Krupinski, MD - Primary    * Avel Peaceodd Rosenbower, MD - Assisting  ANESTHESIA:   general  EBL:  Total I/O In: 1000 [I.V.:1000] Out: 150 [Urine:50; Blood:100]  BLOOD ADMINISTERED:none  DRAINS: none   LOCAL MEDICATIONS USED:  NONE  SPECIMEN:  Excision  DISPOSITION OF SPECIMEN:  PATHOLOGY  COUNTS:  YES  TOURNIQUET:  * No tourniquets in log *  DICTATION: .Other Dictation: Dictation Number [id number]  PLAN OF CARE: Admit to inpatient   PATIENT DISPOSITION:  ICU - extubated and stable.   Delay start of Pharmacological VTE agent (>24hrs) due to surgical blood loss or risk of bleeding: yes  Velora Hecklerodd M. Dorthie Santini, MD, [facility] Office: [phone]

## 2016-04-20 NOTE — Progress Notes (Signed)
Patient belongings taken to Room 1241.  Handed off report of belongings to Nurse Tech.

## 2016-04-20 NOTE — Anesthesia Postprocedure Evaluation (Addendum)
Anesthesia Post Note  Patient: Valerie Carr  Procedure(s) Performed: Procedure(s) (LRB): EXPLORATORY LAPAROTOMY, SMALL BOWEL RESECTION, LYSIS OF ADHESIONS (N/A)  Patient location during evaluation: PACU Anesthesia Type: General Level of consciousness: awake and alert Pain management: pain level controlled Vital Signs Assessment: post-procedure vital signs reviewed and stable Respiratory status: spontaneous breathing, nonlabored ventilation, respiratory function stable and patient connected to nasal cannula oxygen Cardiovascular status: blood pressure returned to baseline and stable Postop Assessment: no signs of nausea or vomiting Anesthetic complications: no Comments: Pain well controlled, resting comfortably.       Last Vitals:  Vitals:   04/20/16 1447 04/20/16 1500  BP: (!) 135/55 (!) 148/70  Pulse: 80 76  Resp: (!) 23 16  Temp:  36.8 C                  Shelton SilvasKevin D Hollis

## 2016-04-21 ENCOUNTER — Encounter (HOSPITAL_COMMUNITY): Payer: Self-pay | Admitting: Surgery

## 2016-04-21 DIAGNOSIS — E119 Type 2 diabetes mellitus without complications: Secondary | ICD-10-CM

## 2016-04-21 LAB — CBC
HEMATOCRIT: 32.4 % — AB (ref 36.0–46.0)
HEMOGLOBIN: 11.1 g/dL — AB (ref 12.0–15.0)
MCH: 29.9 pg (ref 26.0–34.0)
MCHC: 34.3 g/dL (ref 30.0–36.0)
MCV: 87.3 fL (ref 78.0–100.0)
PLATELETS: 183 10*3/uL (ref 150–400)
RBC: 3.71 MIL/uL — AB (ref 3.87–5.11)
RDW: 13.9 % (ref 11.5–15.5)
WBC: 11.5 10*3/uL — AB (ref 4.0–10.5)

## 2016-04-21 LAB — GLUCOSE, CAPILLARY
Glucose-Capillary: 178 mg/dL — ABNORMAL HIGH (ref 65–99)
Glucose-Capillary: 118 mg/dL — ABNORMAL HIGH (ref 65–99)

## 2016-04-21 LAB — BASIC METABOLIC PANEL
ANION GAP: 3 — AB (ref 5–15)
BUN: 30 mg/dL — ABNORMAL HIGH (ref 6–20)
CHLORIDE: 107 mmol/L (ref 101–111)
CO2: 24 mmol/L (ref 22–32)
Calcium: 8.3 mg/dL — ABNORMAL LOW (ref 8.9–10.3)
Creatinine, Ser: 1.05 mg/dL — ABNORMAL HIGH (ref 0.44–1.00)
GFR calc non Af Amer: 47 mL/min — ABNORMAL LOW (ref >60)
GFR, EST AFRICAN AMERICAN: 55 mL/min — AB (ref >60)
Glucose, Bld: 234 mg/dL — ABNORMAL HIGH (ref 65–99)
POTASSIUM: 4.6 mmol/L (ref 3.5–5.1)
SODIUM: 134 mmol/L — AB (ref 135–145)

## 2016-04-21 LAB — HEMOGLOBIN A1C
Hgb A1c MFr Bld: 7.1 % — ABNORMAL HIGH (ref 4.8–5.6)
Mean Plasma Glucose: 157 mg/dL

## 2016-04-21 MED ORDER — ACETAMINOPHEN 10 MG/ML IV SOLN
1000.0000 mg | Freq: Once | INTRAVENOUS | Status: AC
  Administered 2016-04-21: 1000 mg via INTRAVENOUS
  Filled 2016-04-21: qty 100

## 2016-04-21 MED ORDER — SODIUM CHLORIDE 0.9 % IV SOLN
INTRAVENOUS | Status: DC
  Administered 2016-04-21 – 2016-04-23 (×4): via INTRAVENOUS

## 2016-04-21 MED ORDER — SODIUM CHLORIDE 0.9 % IV SOLN
INTRAVENOUS | Status: DC
  Administered 2016-04-21: 09:00:00 via INTRAVENOUS

## 2016-04-21 MED ORDER — INSULIN ASPART 100 UNIT/ML ~~LOC~~ SOLN
0.0000 [IU] | Freq: Three times a day (TID) | SUBCUTANEOUS | Status: DC
  Administered 2016-04-22: 1 [IU] via SUBCUTANEOUS
  Administered 2016-04-24 (×2): 2 [IU] via SUBCUTANEOUS
  Administered 2016-04-24: 1 [IU] via SUBCUTANEOUS
  Administered 2016-04-25: 2 [IU] via SUBCUTANEOUS

## 2016-04-21 MED ORDER — PHENOL 1.4 % MT LIQD
1.0000 | OROMUCOSAL | Status: DC | PRN
Start: 2016-04-21 — End: 2016-04-25
  Administered 2016-04-21: 1 via OROMUCOSAL
  Filled 2016-04-21: qty 177

## 2016-04-21 NOTE — Progress Notes (Signed)
Pt stood at bedside x3 with walker and assistance. Unable to ambulate in room d/t weakness. Also, multiple attempts to pull out NG tube while attempting to ambulate her.

## 2016-04-21 NOTE — Progress Notes (Signed)
Inpatient Diabetes Program Recommendations  AACE/ADA: New Consensus Statement on Inpatient Glycemic Control (2015)  Target Ranges:  Prepandial:   less than 140 mg/dL      Peak postprandial:   less than 180 mg/dL (1-2 hours)      Critically ill patients:  140 - 180 mg/dL   Lab Results  Component Value Date   GLUCAP 211 (H) 04/20/2016   HGBA1C 7.1 (H) 04/20/2016   Results for Valerie Carr, Valerie Carr (MRN [id number]) as of 04/21/2016 10:42  Ref. Range 04/20/2016 07:23 04/20/2016 11:32 04/20/2016 12:07 04/20/2016 14:45 04/20/2016 17:02  Glucose-Capillary Latest Ref Range: 65 - 99 mg/dL 409245 (H) 811203 (H) 914178 (H) 166 (H) 211 (H)   Review of Glycemic Control  Inpatient Diabetes Program Recommendations:   Add Novolog 0-9 units Q4H.  Will follow.  Thank you. Ailene Ardshonda Dimitriy Carreras, RD, LDN, CDE Inpatient Diabetes Coordinator [phone]

## 2016-04-21 NOTE — Progress Notes (Signed)
1 Day Post-Op  Subjective: Sitting up some in bed, cold, and not really remembering what happened.  No distress. Extubated and breathing without issue.   Objective: Vital signs in last 24 hours: Temp:  [97 F (36.1 C)-99.6 F (37.6 C)] 98.1 F (36.7 C) (03/26 0410) Pulse Rate:  [64-113] 74 (03/26 0410) Resp:  [8-29] 19 (03/26 0410) BP: (119-163)/(40-70) 152/45 (03/26 0410) SpO2:  [75 %-100 %] 100 % (03/26 0410) Last BM Date:  (Not known) 3800 IV Urine 1050 NG 25 Blood 100 Afebrile, VSS Creatinine is better with hydration and GFR is better this AM WBC better, H/H down some No xray.   Intake/Output from previous day: 03/25 0701 - 03/26 0700 In: 3786.7 [I.V.:3686.7; IV Piggyback:100] Out: 1175 [Urine:1050; Emesis/NG output:25; Blood:100] Intake/Output this shift: Total I/O In: -  Out: 175 [Urine:175]  General appearance: alert, cooperative, no distress and says she hurts and is sore, did not remeber surgery. Resp: clear to auscultation bilaterally and anterior exam GI: soft, no BS, incision looks fine, no flatus  - foley in place  Lab Results:   Recent Labs  04/20/16 0517 04/21/16 0350  WBC 14.9* 11.5*  HGB 13.2 11.1*  HCT 39.1 32.4*  PLT 228 183    BMET  Recent Labs  04/20/16 0517 04/21/16 0350  NA 136 134*  K 4.6 4.6  CL 107 107  CO2 23 24  GLUCOSE 245* 234*  BUN 36* 30*  CREATININE 1.53* 1.05*  CALCIUM 8.7* 8.3*   PT/INR No results for input(s): LABPROT, INR in the last 72 hours.   Recent Labs Lab 04/18/16 1011 04/19/16 0529 04/20/16 0517  AST 18 17 12*  ALT 13* 10* 8*  ALKPHOS 44 40 39  BILITOT 0.7 0.7 0.5  PROT 8.2* 6.8 6.3*  ALBUMIN 4.7 3.5 3.2*     Lipase     Component Value Date/Time   LIPASE 38 04/18/2016 1011     Studies/Results: Dg Abd 1 View  Result Date: 04/20/2016 CLINICAL DATA:  Evaluate NG tube. EXAM: ABDOMEN - 1 VIEW COMPARISON:  April 20, 2016 FINDINGS: The NG tube terminates in the left upper quadrant.  Air-filled mildly prominent loops of bowel are identified, less prominent since earlier today. IMPRESSION: The NG tube is in good position. Air-filled prominent loops of small bowel, improved since earlier today. Electronically Signed   By: Gerome Samavid  Williams III M.D   On: 04/20/2016 19:36   Dg Abd 2 Views  Result Date: 04/20/2016 CLINICAL DATA:  Abdominal pain.  Recent small bowel obstruction. EXAM: ABDOMEN - 2 VIEW COMPARISON:  CT abdomen and pelvis April 18, 2016; abdominal radiographs April 19, 2016 FINDINGS: Supine and left lateral decubitus images were obtained. Multiple loops of dilated bowel remained. There are occasional air-fluid levels. No free air. Sizable gallstone noted in the gallbladder region. There is new calcification in the aorta with abdominal aortic distention as noted on recent CT. IMPRESSION: The bowel gas pattern is consistent with a degree of persistent bowel obstruction. No free air. Gallstone present. Aortic aneurysm, better delineated on recent CT. Electronically Signed   By: Bretta BangWilliam  Woodruff III M.D.   On: 04/20/2016 10:09    Medications: . enoxaparin (LOVENOX) injection  30 mg Subcutaneous Q24H  . mouth rinse  15 mL Mouth Rinse BID  . pantoprazole (PROTONIX) IV  40 mg Intravenous QHS   . sodium chloride  at 75 ml/hr   Assessment/Plan Small bowel obstruction, ischemic and infarcted small bowel s/p Exploratory laparotomy,  Lysis of adhesions,  Small bowel resection (proximal ileum), 04/20/16, Dr. Darnell Level Alzheimer's disease   Sick sinus syndrome/Chronic atrial fibrillation/PTVP [employer] 01/07/11  S/p CABG 06/2005   Essential hypertension   Carotid artery stenosis, asymptomatic HLD (hyperlipidemia) FEN:  IIV fluids/NPO DVT:  Lovenox ID:  Invanz pre op     Plan:  I would leave foley today, get her OOB some.  I have resumed the IV tylenol, and hope to keep her narcotics low.  Continue NG, nothing coming from it right now.   LOS: 2 days     Valerie Carr 04/21/2016 [phone]

## 2016-04-21 NOTE — Progress Notes (Signed)
When RN came in to patient's room  during shift change, RN noticed pt. had pulled out her NG tube again, although she did not recall doing so. She had made multiple attempts to remove it during the day, per day shift RN.. On assessment, bowel sounds are hypoactive, abdomen is soft , total NG output since 6 am today is approx. 150 ml. Spoke to Surgeon on call and he recommended we can hold off on putting another NG tube in. RN will continue to monitor patient.

## 2016-04-21 NOTE — Progress Notes (Signed)
PROGRESS NOTE  Valerie Carr ZOX:[id number] DOB: January 17, 1930 DOA: 04/18/2016 PCP: [name],[name], MD  HPI/Recap of past 24 hours:  s/p ex lap on 3/25, on ng suction ( patient removed once, ng replace)   Vital stable, no fever, wbc and cr improving, blood sugar elevated  Assessment/Plan: Active Problems:   Alzheimer's disease   Sick sinus syndrome (HCC)   Chronic atrial fibrillation (HCC)   Cardiac pacemaker in situ   Coronary artery disease involving coronary bypass graft of native heart without angina pectoris   HLD (hyperlipidemia)   Essential hypertension   Carotid artery stenosis, asymptomatic   SBO (small bowel obstruction)  small bowel obstruction (h/o abdominal hysterectomy, appendectomy), ischemic and infarcted small bowel s/p Exploratory laparotomy. Lysis of adhesions. Small bowel resection (proximal ileum). On 3/25 by general surgery Dr Gerrit FriendsGerkin On NG suction, ivf ( changed from d5/ns/k to ns due to elevated blood sugar) Management per general surgery  Cholelithiasis on CTAb/pel:  lft wnl  AKI on Chronic kidney disease stage III -Patient's GFR has been in stage III range at least since 2015,  - ua does showed glucose, but no infection -Worsening of cr likely from sbo, dehydration, -cr improved post op, continue monitor,  renal dosing meds  Coronary artery disease status post CABG -No chest pain, this appears to be stable, currently npo, oral meds held  Sick sinus syndrome/A. fib status post Maze procedure and now with pacemaker -Monitor on telemetry., her EKG shows paced rhythm. -Not on anticoagulation probably due to age/dementia, she is on full dose aspirin.  oral meds held, - CHADSVASC score at least 2  Hypertension -Resume home medications when able to take oral meds, placed on IV hydralazine as needed if she is unable to take p.o.  noninsulin dependent Diabetes mellitus type II -She is on metformin at home, place patient on sliding scale while here.   -am blood sugar elevated, change ivf, continue monitor  Abdominal aortic aneurysm to max of 4.2 cm -This was present at least on a CT scan dated in 2010, so doubt acute or new  Alzheimer's dementia -She has underlying confusion, (not oriented to time), no behavioral disturbances noted, continue home medications   Severe malnutrition: Daughter report significant weight loss in the last few months 15pounds Will ask nutrition to see if patient can take oral  FTT/frequent falls: she has lived at a independent living for the past 9years, will need higher level of care  I have discussed with daughter if patient does not improved, may consider palliative care/comfort measures, she agrees to it, but hopefully, patient will be able to improve and able to eat.     Code Status: DNR  Family Communication: patient   Disposition Plan: pending   Consultants:  General surgery  Procedures:  Ex lap/LOA /small bowel resection on 3/25  Antibiotics:  Perioperative invanz on 3/25   Objective: BP (!) 152/45   Pulse 74   Temp 98.1 F (36.7 C) (Axillary)   Resp 19   Ht 5\' 2"  (1.575 m)   Wt 58.1 kg (128 lb)   SpO2 100%   BMI 23.41 kg/m   Intake/Output Summary (Last 24 hours) at 04/21/16 0835 Last data filed at 04/21/16 0800  Gross per 24 hour  Intake          3786.67 ml  Output             1350 ml  Net          2436.67 ml  Filed Weights   04/18/16 0924  Weight: 58.1 kg (128 lb)    Exam:   General:  On ng suction, demented, no agitation this am  Cardiovascular: paced rhythm  Respiratory: CTABL  Abdomen: post op changes  Musculoskeletal: No Edema  Neuro: demented   Data Reviewed: Basic Metabolic Panel:  Recent Labs Lab 04/16/16 1541 04/18/16 1011 04/19/16 0529 04/20/16 0517 04/21/16 0350  NA 141 141 137 136 134*  K 3.6 3.0* 3.9 4.6 4.6  CL 102 107 106 107 107  CO2  --  26 21* 23 24  GLUCOSE 117* 216* 324* 245* 234*  BUN 30* 25* 26* 36* 30*    CREATININE 1.40* 1.16* 1.12* 1.53* 1.05*  CALCIUM  --  9.4 8.5* 8.7* 8.3*  MG  --   --   --  2.1  --    Liver Function Tests:  Recent Labs Lab 04/18/16 1011 04/19/16 0529 04/20/16 0517  AST 18 17 12*  ALT 13* 10* 8*  ALKPHOS 44 40 39  BILITOT 0.7 0.7 0.5  PROT 8.2* 6.8 6.3*  ALBUMIN 4.7 3.5 3.2*    Recent Labs Lab 04/18/16 1011  LIPASE 38   No results for input(s): AMMONIA in the last 168 hours. CBC:  Recent Labs Lab 04/16/16 1541 04/18/16 1011 04/19/16 0529 04/20/16 0517 04/21/16 0350  WBC  --  6.0 15.9* 14.9* 11.5*  NEUTROABS  --  5.1  --  11.5*  --   HGB 12.9 12.5 14.4 13.2 11.1*  HCT 38.0 36.5 41.8 39.1 32.4*  MCV  --  85.7 84.6 87.1 87.3  PLT  --  178 278 228 183   Cardiac Enzymes:   No results for input(s): CKTOTAL, CKMB, CKMBINDEX, TROPONINI in the last 168 hours. BNP (last 3 results) No results for input(s): BNP in the last 8760 hours.  ProBNP (last 3 results) No results for input(s): PROBNP in the last 8760 hours.  CBG:  Recent Labs Lab 04/19/16 2122 04/20/16 0723 04/20/16 1132 04/20/16 1445 04/20/16 1702  GLUCAP 151* 245* 203* 166* 211*    Recent Results (from the past 240 hour(s))  MRSA PCR Screening     Status: None   Collection Time: 04/18/16  3:17 PM  Result Value Ref Range Status   MRSA by PCR NEGATIVE NEGATIVE Final    Comment:        The GeneXpert MRSA Assay (FDA approved for NASAL specimens only), is one component of a comprehensive MRSA colonization surveillance program. It is not intended to diagnose MRSA infection nor to guide or monitor treatment for MRSA infections.      Studies: Dg Abd 1 View  Result Date: 04/20/2016 CLINICAL DATA:  Evaluate NG tube. EXAM: ABDOMEN - 1 VIEW COMPARISON:  April 20, 2016 FINDINGS: The NG tube terminates in the left upper quadrant. Air-filled mildly prominent loops of bowel are identified, less prominent since earlier today. IMPRESSION: The NG tube is in good position. Air-filled  prominent loops of small bowel, improved since earlier today. Electronically Signed   By: Gerome Sam III M.D   On: 04/20/2016 19:36   Dg Abd 2 Views  Result Date: 04/20/2016 CLINICAL DATA:  Abdominal pain.  Recent small bowel obstruction. EXAM: ABDOMEN - 2 VIEW COMPARISON:  CT abdomen and pelvis April 18, 2016; abdominal radiographs April 19, 2016 FINDINGS: Supine and left lateral decubitus images were obtained. Multiple loops of dilated bowel remained. There are occasional air-fluid levels. No free air. Sizable gallstone noted in the gallbladder region. There  is new calcification in the aorta with abdominal aortic distention as noted on recent CT. IMPRESSION: The bowel gas pattern is consistent with a degree of persistent bowel obstruction. No free air. Gallstone present. Aortic aneurysm, better delineated on recent CT. Electronically Signed   By: Bretta Bang III M.D.   On: 04/20/2016 10:09    Scheduled Meds: . enoxaparin (LOVENOX) injection  30 mg Subcutaneous Q24H  . mouth rinse  15 mL Mouth Rinse BID  . pantoprazole (PROTONIX) IV  40 mg Intravenous QHS    Continuous Infusions: . sodium chloride       Time spent:  Javius Sylla MD, PhD  [facility] Pager [phone]. If 7PM-7AM, please contact night-coverage at [email or web address], password [facility] 04/21/2016, 8:35 AM  LOS: 2 days

## 2016-04-21 NOTE — Care Management Note (Signed)
Case Management Note  Patient Details  Name: Valerie Carr MRN: [id number] Date of Birth: July 06, 1929  Subjective/Objective:        abd pain n&v, lives alone has adult children for support.            Action/Plan:Date:  April 21, 2016 Chart reviewed for concurrent status and case management needs. Will continue to follow patient progress. Discharge Planning: following for needs Expected discharge date: [id number] Marcelle SmilingRhonda Davis, BSN, [place]3, [place]   [phone]  Expected Discharge Date:   (unknown)               Expected Discharge Plan:  Home/Self Care  In-House Referral:     Discharge planning Services     Post Acute Care Choice:    Choice offered to:     DME Arranged:    DME Agency:     HH Arranged:    HH Agency:     Status of Service:  In process, will continue to follow  If discussed at Long Length of Stay Meetings, dates discussed:    Additional Comments:  Golda AcreDavis, Rhonda Lynn, RN 04/21/2016, 11:17 AM

## 2016-04-21 NOTE — Progress Notes (Signed)
Initial Nutrition Assessment  DOCUMENTATION CODES:   Not applicable  INTERVENTION:  - Diet advancement as medically feasible. - RD will continue to monitor for needs.  NUTRITION DIAGNOSIS:   Inadequate oral intake related to inability to eat as evidenced by NPO status.  GOAL:   Patient will meet greater than or equal to 90% of their needs  MONITOR:   Diet advancement, Weight trends, Labs, I & O's  REASON FOR ASSESSMENT:   Malnutrition Screening Tool  ASSESSMENT:   83 y.o. female with medical history significant of dementia, coronary artery disease status post CABG, atrial fibrillation status post Maze procedure and sick sinus syndrome status post pacemaker, hypertension, hyperlipidemia, type 2 diabetes mellitus, presents to the emergency room with chief complaint of abdominal pain, as well as nausea vomiting.  Patient has underlying dementia and there is no family at bedside so history is somewhat limited.  She basically tells me that she has been having abdominal pain for the past hour, and had one emesis episode today.  She does not know if she has had these symptoms in the past.   Pt seen for MST. BMI indicates normal weight. Pt with hx of Alzheimer's dementia and was unable to provide information. She is currently in bilateral mitten and soft wrist restraints and asked why these were in place; shortly after rationale provided pt would ask this question again. No family/visitors present at this time to provide PTA information.   POD #1 ex lap with LOA and small bowel resection. NGT currently in place in R nare and pt complains of itchiness inside of nostril(s). No output noted at this time.   Physical assessment shows mild muscle wasting to legs and no fat wasting. Feel that mild muscle wasting to legs is age-related rather than related to malnutrition. Per MST report, in the ED it was reported that pt has lost 15 lbs in the past 6 months. This is inconsistent with current weight  which is consistent with most weights from 2015-present. From June 2017-October 2017, pt gained 13 lbs. She has subsequently lost 10 lbs (7% body weight), which is not significant for 5 month time frame. MD note states severe malnutrition but unable to confirm this based on ASPEN guidelines.   Medications reviewed; 40 mg IV Protonix/day.  Labs reviewed; Na: 134 mmol/L, BUN: 30 mg/dL, creatinine: 1.611.05 mg/dL, Ca: 8.3 mg/dL, GFR: 47 mL/min.   IVF: NS @ 75 mL/hr.    Diet Order:  Diet NPO time specified Except for: Ice Chips, Sips with Meds  Skin:  Wound (see comment) (Abdominal incision from 3/25)  Last BM:  PTA/unknown  Height:   Ht Readings from Last 1 Encounters:  04/18/16 5\' 2"  (1.575 m)    Weight:   Wt Readings from Last 1 Encounters:  04/18/16 128 lb (58.1 kg)    Ideal Body Weight:  50 kg  BMI:  Body mass index is 23.41 kg/m.  Estimated Nutritional Needs:   Kcal:  1450-1630 (25-28 kcal/kg)  Protein:  65-75 grams  Fluid:  >/= 1.7 L/day  EDUCATION NEEDS:   No education needs identified at this time    Trenton GammonJessica Kenlie Seki, MS, RD, LDN, CNSC Inpatient Clinical Dietitian Pager # [phone] After hours/weekend pager # [phone]

## 2016-04-22 ENCOUNTER — Encounter (HOSPITAL_COMMUNITY): Payer: Medicare Other

## 2016-04-22 DIAGNOSIS — N179 Acute kidney failure, unspecified: Secondary | ICD-10-CM

## 2016-04-22 LAB — BASIC METABOLIC PANEL
ANION GAP: 5 (ref 5–15)
BUN: 24 mg/dL — ABNORMAL HIGH (ref 6–20)
CALCIUM: 8.2 mg/dL — AB (ref 8.9–10.3)
CO2: 25 mmol/L (ref 22–32)
CREATININE: 0.96 mg/dL (ref 0.44–1.00)
Chloride: 104 mmol/L (ref 101–111)
GFR, EST NON AFRICAN AMERICAN: 53 mL/min — AB (ref >60)
Glucose, Bld: 131 mg/dL — ABNORMAL HIGH (ref 65–99)
Potassium: 3.9 mmol/L (ref 3.5–5.1)
Sodium: 134 mmol/L — ABNORMAL LOW (ref 135–145)

## 2016-04-22 LAB — CBC
HCT: 29.3 % — ABNORMAL LOW (ref 36.0–46.0)
Hemoglobin: 10 g/dL — ABNORMAL LOW (ref 12.0–15.0)
MCH: 29.7 pg (ref 26.0–34.0)
MCHC: 34.1 g/dL (ref 30.0–36.0)
MCV: 86.9 fL (ref 78.0–100.0)
PLATELETS: 194 10*3/uL (ref 150–400)
RBC: 3.37 MIL/uL — ABNORMAL LOW (ref 3.87–5.11)
RDW: 13.7 % (ref 11.5–15.5)
WBC: 9.8 10*3/uL (ref 4.0–10.5)

## 2016-04-22 LAB — GLUCOSE, CAPILLARY
GLUCOSE-CAPILLARY: 127 mg/dL — AB (ref 65–99)
GLUCOSE-CAPILLARY: 119 mg/dL — AB (ref 65–99)
GLUCOSE-CAPILLARY: 95 mg/dL (ref 65–99)
Glucose-Capillary: 106 mg/dL — ABNORMAL HIGH (ref 65–99)

## 2016-04-22 LAB — MAGNESIUM: Magnesium: 1.6 mg/dL — ABNORMAL LOW (ref 1.7–2.4)

## 2016-04-22 MED ORDER — QUETIAPINE FUMARATE 25 MG PO TABS
25.0000 mg | ORAL_TABLET | Freq: Every day | ORAL | Status: AC
  Administered 2016-04-22 – 2016-04-23 (×2): 25 mg via ORAL
  Filled 2016-04-22 (×2): qty 1

## 2016-04-22 MED ORDER — ACETAMINOPHEN 10 MG/ML IV SOLN
1000.0000 mg | Freq: Four times a day (QID) | INTRAVENOUS | Status: AC | PRN
  Administered 2016-04-22: 1000 mg via INTRAVENOUS
  Filled 2016-04-22 (×2): qty 100

## 2016-04-22 MED ORDER — MAGNESIUM SULFATE 2 GM/50ML IV SOLN
2.0000 g | Freq: Once | INTRAVENOUS | Status: AC
  Administered 2016-04-22: 2 g via INTRAVENOUS

## 2016-04-22 MED ORDER — HYDROMORPHONE HCL 1 MG/ML IJ SOLN
0.5000 mg | INTRAMUSCULAR | Status: DC | PRN
  Administered 2016-04-22 – 2016-04-23 (×2): 0.5 mg via INTRAVENOUS
  Administered 2016-04-23 – 2016-04-25 (×7): 1 mg via INTRAVENOUS
  Filled 2016-04-22 (×9): qty 1

## 2016-04-22 MED ORDER — METOPROLOL TARTRATE 5 MG/5ML IV SOLN
2.5000 mg | Freq: Three times a day (TID) | INTRAVENOUS | Status: DC
  Administered 2016-04-22 – 2016-04-25 (×9): 2.5 mg via INTRAVENOUS
  Filled 2016-04-22 (×9): qty 5

## 2016-04-22 MED ORDER — HYDRALAZINE HCL 20 MG/ML IJ SOLN: 10.0000 mg | Freq: Four times a day (QID) | INTRAMUSCULAR | Status: DC | PRN

## 2016-04-22 MED ORDER — ENOXAPARIN SODIUM 40 MG/0.4ML ~~LOC~~ SOLN
40.0000 mg | SUBCUTANEOUS | Status: DC
  Administered 2016-04-22 – 2016-04-25 (×4): 40 mg via SUBCUTANEOUS
  Filled 2016-04-22 (×4): qty 0.4

## 2016-04-22 NOTE — Progress Notes (Signed)
qPhysical Therapy Treatment Patient Details Name: Valerie Carr MRN: [id number] DOB: 11-Apr-1929 Today's Date: 04/22/2016    History of Present Illness Valerie Carr is a 83 y.o. female with medical history significant of dementia, coronary artery disease status post CABG, atrial fibrillation , sick sinus syndrome status post pacemaker, hypertension, hyperlipidemia, type 2 diabetes mellitus, presents to the emergency room with chief complaint of abdominal pain, as well as nausea vomiting; , S/P exCT + possible SBO,S/P explaratory lap 3/25    PT Comments    Assisted the patient to sitting and partial standing at the bedside. Recommend SNF. Daughter in room. Continue PT.  Follow Up Recommendations  SNF;Supervision/Assistance - 24 hour     Equipment Recommendations  None recommended by PT    Recommendations for Other Services       Precautions / Restrictions Precautions Precautions: Fall    Mobility  Bed Mobility   Bed Mobility: Rolling;Sidelying to Sit;Sit to Sidelying Rolling: Mod assist Sidelying to sit: Mod assist;HOB elevated     Sit to sidelying: Mod assist;HOB elevated General bed mobility comments: incr time, mod assist to bring trunk upright, tactile cues to move the legs, assist legs onto the bed.o bring trunk to upright  Transfers Overall transfer level: Needs assistance     Sit to Stand: Mod assist         General transfer comment: partial stand from bed with assist to rise and scoot sideways along the bed  x 3 to move towards HOB.  Ambulation/Gait                 Stairs            Wheelchair Mobility    Modified Rankin (Stroke Patients Only)       Balance                                            Cognition Arousal/Alertness: Awake/alert   Overall Cognitive Status: History of cognitive impairments - at baseline Area of Impairment: Orientation;Memory;Following commands                        Following Commands: Follows one step commands consistently;Follows multi-step commands inconsistently              Exercises      General Comments        Pertinent Vitals/Pain Faces Pain Scale: Hurts whole lot Pain Location: abd and chest  Pain Descriptors / Indicators: Discomfort;Grimacing;Guarding;Squeezing Pain Intervention(s): Monitored during session;Repositioned    Home Living                      Prior Function            PT Goals (current goals can now be found in the care plan section) Progress towards PT goals: Progressing toward goals    Frequency    Min 3X/week      PT Plan Current plan remains appropriate    Co-evaluation             End of Session   Activity Tolerance: Patient limited by pain Patient left: in bed;with call bell/phone within reach;with bed alarm set;with family/visitor present Nurse Communication: Mobility status PT Visit Diagnosis: Unsteadiness on feet (R26.81);Pain     Time: 4098-1191 PT Time Calculation (min) (ACUTE ONLY): 28 min  Charges:  $  Therapeutic Activity: 23-37 mins                    G Codes:  Functional Assessment Tool Used: AM-PAC 6 Clicks Basic Mobility    St. MartinKaren Tyteanna Ost PT [phone]    Rada HayHill, Ermelinda Eckert Elizabeth 04/22/2016, 6:33 PM

## 2016-04-22 NOTE — Progress Notes (Addendum)
PROGRESS NOTE  Valerie Carr ZOX:[id number] DOB: 10-Oct-1929 DOA: 04/18/2016 PCP: Cala Bradford, MD  Brief Summary:   h/o CABG, dementia, presented with acute on chronic abdominal pain, n/v, found to have SBO (ischemic bowel), s/p ex lap, LOA and small bowel resection on 3/25 Surgery following.   HPI/Recap of past 24 hours:  s/p ex lap on 3/25, she pulled out NG tube again, she has mittens on this am   Vital stable, no fever, wbc and cr improving, blood sugar improved  Assessment/Plan: Active Problems:   Alzheimer's disease   Sick sinus syndrome (HCC)   Chronic atrial fibrillation (HCC)   Cardiac pacemaker in situ   Coronary artery disease involving coronary bypass graft of native heart without angina pectoris   HLD (hyperlipidemia)   Essential hypertension   Carotid artery stenosis, asymptomatic   SBO (small bowel obstruction)  small bowel obstruction (h/o abdominal hysterectomy, appendectomy), ischemic and infarcted small bowel s/p Exploratory laparotomy. Lysis of adhesions. Small bowel resection (proximal ileum). On 3/25 by general surgery Dr Gerrit Friends She pulled off NG tube twice, she reported passing gas, less pain, no n/v, NG was not placed back, she is started on ice chips and sip of water on 3/27 per general surgery Continue ivf, will follow general surgery recommendations.  Cholelithiasis on CTAb/pel:  lft wnl  AKI on Chronic kidney disease stage III -Patient's GFR has been in stage III range at least since 2015,  - ua does showed glucose, but no infection -Worsening of cr likely from sbo, dehydration, -cr  Peaked at 1.5 on 3/25, cr improved post op, cr 0.96 on 3/27, continue monitor,  renal dosing meds  Hypomagnesemia: replace mag  Coronary artery disease status post CABG -No chest pain, this appears to be stable, currently npo, oral meds held  Sick sinus syndrome/A. fib status post Maze procedure and now with pacemaker -Monitor on telemetry., her EKG shows  paced rhythm. -Not on anticoagulation probably due to age/dementia, she is on full dose aspirin.  oral meds held, - CHADSVASC score at least 2  Hypertension -Resume home medications when able to take oral meds,  -on iv lopressor, and  IV hydralazine prn,  -transition to oral meds when able to take po consistently  noninsulin dependent Diabetes mellitus type II -a1c 7 -She is on metformin at home, place patient on sliding scale while here.    Abdominal aortic aneurysm to max of 4.2 cm -This was present at least on a CT scan dated in 2010, so doubt acute or new  Alzheimer's dementia -She has underlying confusion, (not oriented to time), resume home medications when able to take oral meds  Severe malnutrition: Daughter report significant weight loss in the last few months 15pounds Nutrition consult when able to have oral intake  FTT/frequent falls: she has lived at a independent living for the past 9years, will need SNF at discharge  I have discussed with daughter if patient does not improved, may consider palliative care/comfort measures, she agrees to it, but hopefully, patient will be able to improve and able to eat.     Code Status: DNR  Family Communication: patient   Disposition Plan: she was transferred to stepdown post op, improving, transfer to med tele on 3/27   Consultants:  General surgery  Procedures:  Ex lap/LOA /small bowel resection on 3/25  Antibiotics:  Perioperative invanz on 3/25   Objective: BP (!) 141/57   Pulse 79   Temp 99.1 F (37.3 C) (Oral)  Resp 16   Ht 5\' 2"  (1.575 m)   Wt 58.1 kg (128 lb)   SpO2 97%   BMI 23.41 kg/m   Intake/Output Summary (Last 24 hours) at 04/22/16 1900 Last data filed at 04/22/16 1700  Gross per 24 hour  Intake             1625 ml  Output             1195 ml  Net              430 ml   Filed Weights   04/18/16 0924  Weight: 58.1 kg (128 lb)    Exam:   General:   demented, no agitation this  am, slight confusion, able to be redirected.   Cardiovascular: paced rhythm  Respiratory: CTABL  Abdomen: post op changes  Musculoskeletal: No Edema  Neuro: demented   Data Reviewed: Basic Metabolic Panel:  Recent Labs Lab 04/18/16 1011 04/19/16 0529 04/20/16 0517 04/21/16 0350 04/22/16 0335  NA 141 137 136 134* 134*  K 3.0* 3.9 4.6 4.6 3.9  CL 107 106 107 107 104  CO2 26 21* 23 24 25   GLUCOSE 216* 324* 245* 234* 131*  BUN 25* 26* 36* 30* 24*  CREATININE 1.16* 1.12* 1.53* 1.05* 0.96  CALCIUM 9.4 8.5* 8.7* 8.3* 8.2*  MG  --   --  2.1  --  1.6*   Liver Function Tests:  Recent Labs Lab 04/18/16 1011 04/19/16 0529 04/20/16 0517  AST 18 17 12*  ALT 13* 10* 8*  ALKPHOS 44 40 39  BILITOT 0.7 0.7 0.5  PROT 8.2* 6.8 6.3*  ALBUMIN 4.7 3.5 3.2*    Recent Labs Lab 04/18/16 1011  LIPASE 38   No results for input(s): AMMONIA in the last 168 hours. CBC:  Recent Labs Lab 04/18/16 1011 04/19/16 0529 04/20/16 0517 04/21/16 0350 04/22/16 0335  WBC 6.0 15.9* 14.9* 11.5* 9.8  NEUTROABS 5.1  --  11.5*  --   --   HGB 12.5 14.4 13.2 11.1* 10.0*  HCT 36.5 41.8 39.1 32.4* 29.3*  MCV 85.7 84.6 87.1 87.3 86.9  PLT 178 278 228 183 194   Cardiac Enzymes:   No results for input(s): CKTOTAL, CKMB, CKMBINDEX, TROPONINI in the last 168 hours. BNP (last 3 results) No results for input(s): BNP in the last 8760 hours.  ProBNP (last 3 results) No results for input(s): PROBNP in the last 8760 hours.  CBG:  Recent Labs Lab 04/20/16 1702 04/21/16 2155 04/22/16 0800 04/22/16 1146 04/22/16 1658  GLUCAP 211* 118* 127* 119* 106*    Recent Results (from the past 240 hour(s))  MRSA PCR Screening     Status: None   Collection Time: 04/18/16  3:17 PM  Result Value Ref Range Status   MRSA by PCR NEGATIVE NEGATIVE Final    Comment:        The GeneXpert MRSA Assay (FDA approved for NASAL specimens only), is one component of a comprehensive MRSA colonization surveillance  program. It is not intended to diagnose MRSA infection nor to guide or monitor treatment for MRSA infections.      Studies: No results found.  Scheduled Meds: . enoxaparin (LOVENOX) injection  40 mg Subcutaneous Q24H  . insulin aspart  0-9 Units Subcutaneous TID WC  . mouth rinse  15 mL Mouth Rinse BID  . metoprolol  2.5 mg Intravenous Q8H  . pantoprazole (PROTONIX) IV  40 mg Intravenous QHS    Continuous Infusions: . sodium chloride  75 mL/hr at 04/22/16 1500     Time spent:  Kielan Dreisbach MD, PhD  [facility] Pager [phone]. If 7PM-7AM, please contact night-coverage at [email or web address], password [facility] 04/22/2016, 7:00 PM  LOS: 3 days

## 2016-04-22 NOTE — Progress Notes (Signed)
Pt with increased confusion, getting OOB.  Patient moved to bed closer to the desk to support safety.  [name] Welcome called to notify of room change and was directed to voicemail box that was "full and cannot leave message".  Orlena SheldonGrandson Steve called and massage was left of voicemail.

## 2016-04-22 NOTE — Progress Notes (Signed)
Patient ID: Valerie Carr, female   DOB: Apr 06, 1929, 83 y.o.   MRN: [id number]  [facility] [place] [facility] Surgery Progress Note  2 Days Post-Op  Subjective: Still confused as to why she is in the hospital. Daughter at bedside. She reports little abdominal pain at this time. She does feel a little bloated. Denies n/v. Thinks that she is passing a little flatus. No BM.  Objective: Vital signs in last 24 hours: Temp:  [97.7 F (36.5 C)-99.1 F (37.3 C)] 99.1 F (37.3 C) (03/27 1107) Pulse Rate:  [70-87] 82 (03/27 1107) Resp:  [12-28] 14 (03/27 1107) BP: (121-177)/(40-102) 139/49 (03/27 1107) SpO2:  [96 %-100 %] 96 % (03/27 1107) Last BM Date: 04/18/16  Intake/Output from previous day: 03/26 0701 - 03/27 0700 In: 1315 [I.V.:1215; IV Piggyback:100] Out: 1175 [Urine:1050; Emesis/NG output:125] Intake/Output this shift: Total I/O In: 575 [I.V.:525; IV Piggyback:50] Out: 370 [Urine:370]  PE: Gen:  Alert, NAD, pleasant Pulm:  CTAB, no W/R/R, effort normal Abd: Soft, mild distension, +BS, midline incision C/D/I with staples intact and honeycomb dressing in place  Lab Results:   Recent Labs  04/21/16 0350 04/22/16 0335  WBC 11.5* 9.8  HGB 11.1* 10.0*  HCT 32.4* 29.3*  PLT 183 194   BMET  Recent Labs  04/21/16 0350 04/22/16 0335  NA 134* 134*  K 4.6 3.9  CL 107 104  CO2 24 25  GLUCOSE 234* 131*  BUN 30* 24*  CREATININE 1.05* 0.96  CALCIUM 8.3* 8.2*   PT/INR No results for input(s): LABPROT, INR in the last 72 hours. CMP     Component Value Date/Time   NA 134 (L) 04/22/2016 0335   K 3.9 04/22/2016 0335   CL 104 04/22/2016 0335   CO2 25 04/22/2016 0335   GLUCOSE 131 (H) 04/22/2016 0335   BUN 24 (H) 04/22/2016 0335   CREATININE 0.96 04/22/2016 0335   CALCIUM 8.2 (L) 04/22/2016 0335   PROT 6.3 (L) 04/20/2016 0517   ALBUMIN 3.2 (L) 04/20/2016 0517   AST 12 (L) 04/20/2016 0517   ALT 8 (L) 04/20/2016 0517   ALKPHOS 39 04/20/2016 0517   BILITOT 0.5 04/20/2016 0517    GFRNONAA 53 (L) 04/22/2016 0335   GFRAA >60 04/22/2016 0335   Lipase     Component Value Date/Time   LIPASE 38 04/18/2016 1011       Studies/Results: Dg Abd 1 View  Result Date: 04/20/2016 CLINICAL DATA:  Evaluate NG tube. EXAM: ABDOMEN - 1 VIEW COMPARISON:  April 20, 2016 FINDINGS: The NG tube terminates in the left upper quadrant. Air-filled mildly prominent loops of bowel are identified, less prominent since earlier today. IMPRESSION: The NG tube is in good position. Air-filled prominent loops of small bowel, improved since earlier today. Electronically Signed   By: Gerome Sam III M.D   On: 04/20/2016 19:36    Anti-infectives: Anti-infectives    Start     Dose/Rate Route Frequency Ordered Stop   04/20/16 1100  ertapenem (INVANZ) 1 g in sodium chloride 0.9 % 50 mL IVPB     1 g 100 mL/hr over 30 Minutes Intravenous On call to O.R. 04/20/16 1052 04/20/16 1259       Assessment/Plan Small bowel obstruction, ischemic and infarcted small bowel s/p Exploratory laparotomy,  Lysis of adhesions, Small bowel resection (proximal ileum), 04/20/16, Dr. Darnell Level - POD 2 - patient removed NG tube  ABLA - Hg 10, continue to monitor Alzheimer's disease Sick sinus syndrome/Chronic atrial fibrillation/PTVP [employer] 01/07/11 S/p CABG6/2007  Essential hypertension Carotid artery stenosis, asymptomatic HLD (hyperlipidemia)  FEN:  IV fluids/NPO except few sips/chips DVT:  Lovenox ID:  Invanz pre op  Plan: Good BS on exam and patient thinks she is passing some flatus. Ok for a few sips of water and ice chips. D/c foley. Consult PT. Will also consult social work to help look for possible SNF placement when ready for discharge.    LOS: 3 days    Edson SnowballBROOKE A MILLER , [facility] 04/22/2016, 1:45 PM Pager: [phone] Consults: [phone] Mon-Fri 7:00 am-4:30 pm Sat-Sun 7:00 am-11:30 am

## 2016-04-22 NOTE — Progress Notes (Addendum)
Assumed care of pt @ 1100.  I agree with previous RN's assessment.  Pt is alert, oriented to self.  Denies pain at this time.  Bowel sounds present (hypoactive), no nausea/ emesis.  Will continue with plan of care.   1800- Pt confused, pulled off midline dressing, attempts OOB, moved closer to Nurse station.

## 2016-04-23 DIAGNOSIS — I25810 Atherosclerosis of coronary artery bypass graft(s) without angina pectoris: Secondary | ICD-10-CM

## 2016-04-23 DIAGNOSIS — R1010 Upper abdominal pain, unspecified: Secondary | ICD-10-CM

## 2016-04-23 DIAGNOSIS — I6523 Occlusion and stenosis of bilateral carotid arteries: Secondary | ICD-10-CM

## 2016-04-23 DIAGNOSIS — Z9049 Acquired absence of other specified parts of digestive tract: Secondary | ICD-10-CM

## 2016-04-23 LAB — CBC
HCT: 26.3 % — ABNORMAL LOW (ref 36.0–46.0)
HEMOGLOBIN: 9.2 g/dL — AB (ref 12.0–15.0)
MCH: 29.8 pg (ref 26.0–34.0)
MCHC: 35 g/dL (ref 30.0–36.0)
MCV: 85.1 fL (ref 78.0–100.0)
Platelets: 186 10*3/uL (ref 150–400)
RBC: 3.09 MIL/uL — ABNORMAL LOW (ref 3.87–5.11)
RDW: 13.4 % (ref 11.5–15.5)
WBC: 6 10*3/uL (ref 4.0–10.5)

## 2016-04-23 LAB — TROPONIN I
TROPONIN I: 0.03 ng/mL — AB (ref <0.03)
Troponin I: <0.03 ng/mL (ref <0.03)
Troponin I: <0.03 ng/mL (ref <0.03)

## 2016-04-23 LAB — BASIC METABOLIC PANEL
ANION GAP: 7 (ref 5–15)
BUN: 15 mg/dL (ref 6–20)
CALCIUM: 7.9 mg/dL — AB (ref 8.9–10.3)
CO2: 26 mmol/L (ref 22–32)
CREATININE: 0.79 mg/dL (ref 0.44–1.00)
Chloride: 104 mmol/L (ref 101–111)
Glucose, Bld: 99 mg/dL (ref 65–99)
Potassium: 2.8 mmol/L — ABNORMAL LOW (ref 3.5–5.1)
SODIUM: 137 mmol/L (ref 135–145)

## 2016-04-23 LAB — GLUCOSE, CAPILLARY
GLUCOSE-CAPILLARY: 94 mg/dL (ref 65–99)
Glucose-Capillary: 91 mg/dL (ref 65–99)
Glucose-Capillary: 70 mg/dL (ref 65–99)

## 2016-04-23 LAB — MAGNESIUM: MAGNESIUM: 1.9 mg/dL (ref 1.7–2.4)

## 2016-04-23 MED ORDER — SODIUM CHLORIDE 0.9 % IV SOLN: 30.0000 meq | Freq: Once | INTRAVENOUS | Status: DC

## 2016-04-23 MED ORDER — POTASSIUM CHLORIDE 10 MEQ/100ML IV SOLN
10.0000 meq | INTRAVENOUS | Status: AC
  Administered 2016-04-23 (×3): 10 meq via INTRAVENOUS
  Filled 2016-04-23 (×3): qty 100

## 2016-04-23 MED ORDER — KCL IN DEXTROSE-NACL 40-5-0.9 MEQ/L-%-% IV SOLN
INTRAVENOUS | Status: DC
  Administered 2016-04-23 – 2016-04-24 (×2): via INTRAVENOUS
  Filled 2016-04-23 (×3): qty 1000

## 2016-04-23 MED ORDER — PANTOPRAZOLE SODIUM 40 MG IV SOLR
40.0000 mg | Freq: Two times a day (BID) | INTRAVENOUS | Status: DC
  Administered 2016-04-23 – 2016-04-25 (×5): 40 mg via INTRAVENOUS
  Filled 2016-04-23 (×5): qty 40

## 2016-04-23 NOTE — NC FL2 (Signed)
[place] MEDICAID FL2 LEVEL OF CARE SCREENING TOOL     IDENTIFICATION  Patient Name: Valerie Carr Birthdate: 1929-02-20 Sex: female Admission Date (Current Location): 04/18/2016  [place] Number:  [place] Bast [id number] N Facility and Address:  [facility],  [address], [place] [address]      Provider Number: [id number]  Attending Physician Name and Address:  Merlene Laughter, DO  Relative Name and Phone Number:       Current Level of Care: SNF Recommended Level of Care: Skilled Nursing Facility Prior Approval Number:    Date Approved/Denied:   PASRR Number: [id number]  Discharge Plan: SNF    Current Diagnoses: Patient Active Problem List   Diagnosis Date Noted  . SBO (small bowel obstruction) 04/18/2016  . Chronic atrial fibrillation (HCC) 05/31/2014  . Cardiac pacemaker in situ 05/31/2014  . Coronary artery disease involving coronary bypass graft of native heart without angina pectoris 05/31/2014  . HLD (hyperlipidemia) 05/31/2014  . Essential hypertension 05/31/2014  . Carotid artery stenosis, asymptomatic 05/31/2014  . Sick sinus syndrome (HCC) 04/18/2013  . Coronary atherosclerosis of native coronary artery 01/11/2013  . Essential hypertension, benign 01/11/2013  . Mixed hyperlipidemia 01/11/2013  . Alzheimer's disease 09/02/2012  . Memory loss 09/02/2012  . Bradycardia 01/06/2011    Orientation RESPIRATION BLADDER Height & Weight     Self, Place  Normal Continent Weight: 128 lb (58.1 kg) Height:  5\' 2"  (157.5 cm)  BEHAVIORAL SYMPTOMS/MOOD NEUROLOGICAL BOWEL NUTRITION STATUS      Continent Diet  AMBULATORY STATUS COMMUNICATION OF NEEDS Skin   Limited Assist Verbally Surgical wounds                       Personal Care Assistance Level of Assistance  Feeding   Feeding assistance: Limited assistance       Functional Limitations Info  Sight, Speech Sight Info: Impaired Hearing Info: Adequate Speech Info:  Adequate    SPECIAL CARE FACTORS FREQUENCY  PT (By licensed PT), OT (By licensed OT)                    Contractures Contractures Info: Not present    Additional Factors Info  Code Status Code Status Info: DNR             Current Medications (04/23/2016):  This is the current hospital active medication list Current Facility-Administered Medications  Medication Dose Route Frequency Provider Last Rate Last Dose  . 0.9 %  sodium chloride infusion   Intravenous Continuous Sherrie George, PA-C 75 mL/hr at 04/23/16 0933    . acetaminophen (OFIRMEV) IV 1,000 mg  1,000 mg Intravenous Q6H PRN Edson Snowball, PA-C   1,000 mg at 04/22/16 1820  . enoxaparin (LOVENOX) injection 40 mg  40 mg Subcutaneous Q24H Otho Bellows, RPH   40 mg at 04/23/16 7846  . hydrALAZINE (APRESOLINE) injection 10 mg  10 mg Intravenous Q6H PRN Albertine Grates, MD      . HYDROmorphone (DILAUDID) injection 0.5-1 mg  0.5-1 mg Intravenous Q4H PRN Edson Snowball, PA-C   0.5 mg at 04/23/16 0524  . insulin aspart (novoLOG) injection 0-9 Units  0-9 Units Subcutaneous TID WC Albertine Grates, MD   1 Units at 04/22/16 [phone]  . MEDLINE mouth rinse  15 mL Mouth Rinse BID Albertine Grates, MD   15 mL at 04/22/16 0932  . metoprolol (LOPRESSOR) injection 2.5 mg  2.5 mg Intravenous Q8H Albertine Grates, MD  2.5 mg at 04/23/16 0609  . ondansetron (ZOFRAN-ODT) disintegrating tablet 4 mg  4 mg Oral Q6H PRN Darnell Levelodd Gerkin, MD       Or  . ondansetron [facility](ZOFRAN) injection 4 mg  4 mg Intravenous Q6H PRN Darnell Levelodd Gerkin, MD      . pantoprazole (PROTONIX) injection 40 mg  40 mg Intravenous Q12H Omair Latif Sheikh, DO      . phenol (CHLORASEPTIC) mouth spray 1 spray  1 spray Mouth/Throat PRN Albertine GratesFang Xu, MD   1 spray at 04/21/16 0840  . potassium chloride 10 mEq in 100 mL IVPB  10 mEq Intravenous Q1 Hr x 3 Omair Latif Sheikh, DO   10 mEq at 04/23/16 1021  . QUEtiapine (SEROQUEL) tablet 25 mg  25 mg Oral QHS Albertine GratesFang Xu, MD   25 mg at 04/22/16 2204     Discharge  Medications: Please see discharge summary for a list of discharge medications.  Relevant Imaging Results:  Relevant Lab Results:   Additional Information SS# [id number]  Deatra RobinsonBarnes, [place] Gorsline [place], [place]

## 2016-04-23 NOTE — Progress Notes (Signed)
Glucose down, so I changed IV fluids and added K+ to them.   D5NS+40 KCL at 75 ml

## 2016-04-23 NOTE — Consult Note (Signed)
SW spoke with pt's dtr [name] [phone] re recommendation for SNF. Pt with no previous SNF stay per dtr. CSW explained placement process and answered questions. Pt's dtr reports agreeable to SNF for short term rehab. Will f/u with offers once available.   Dellie BurnsJosie Chasidy Janak, MSW, LCSW [phone] (coverage)

## 2016-04-23 NOTE — Care Management Important Message (Signed)
Important Message  Patient Details  Name: Valerie Carr MRN: [id number] Date of Birth: 01-02-1930   Medicare Important Message Given:  Yes    Caren MacadamFuller, Chene Kasinger 04/23/2016, 11:20 AMImportant Message  Patient Details  Name: Valerie Carr MRN: [id number] Date of Birth: 01-02-1930   Medicare Important Message Given:  Yes    Caren MacadamFuller, Kelee Cunningham 04/23/2016, 11:20 AM

## 2016-04-23 NOTE — Progress Notes (Signed)
3 Days Post-Op  Subjective: She remains confused, sites ok. Some BS.  She doesn't really remember what happened without some assistance.  She still has O2 on, not on this at home.     Objective: Vital signs in last 24 hours: Temp:  [99.1 F (37.3 C)-99.8 F (37.7 C)] 99.8 F (37.7 C) (03/28 0607) Pulse Rate:  [79-81] 81 (03/28 0607) Resp:  [16-18] 18 (03/28 0607) BP: (124-141)/(41-57) 129/41 (03/28 0607) SpO2:  [95 %-99 %] 99 % (03/28 0607) Last BM Date: 04/18/16  NPO - 30 ml 2400 IV 770 urine recorded Afebrile, VSS K+ 2.8/Mag 1.9  - being replaced by Medicine NO films    Intake/Output from previous day: 03/27 0701 - 03/28 0700 In: 2355 [P.O.:30; I.V.:2175; IV Piggyback:150] Out: 770 [Urine:770] Intake/Output this shift: No intake/output data recorded.  General appearance: alert, cooperative and no distress Resp: clear to auscultation bilaterally GI: large silicone dressing in place.  site is tender, + BS, she can't tell us if she has had flatus.   Lab Results:   Recent Labs  04/22/16 0335 04/23/16 0547  WBC 9.8 6.0  HGB 10.0* 9.2*  HCT 29.3* 26.3*  PLT 194 186    BMET  Recent Labs  04/22/16 0335 04/23/16 0547  NA 134* 137  K 3.9 2.8*  CL 104 104  CO2 25 26  GLUCOSE 131* 99  BUN 24* 15  CREATININE 0.96 0.79  CALCIUM 8.2* 7.9*   PT/INR No results for input(s): LABPROT, INR in the last 72 hours.   Recent Labs Lab 04/18/16 1011 04/19/16 0529 04/20/16 0517  AST 18 17 12*  ALT 13* 10* 8*  ALKPHOS 44 40 39  BILITOT 0.7 0.7 0.5  PROT 8.2* 6.8 6.3*  ALBUMIN 4.7 3.5 3.2*     Lipase     Component Value Date/Time   LIPASE 38 04/18/2016 1011     Studies/Results: No results found.  Medications: . enoxaparin (LOVENOX) injection  40 mg Subcutaneous Q24H  . insulin aspart  0-9 Units Subcutaneous TID WC  . mouth rinse  15 mL Mouth Rinse BID  . metoprolol  2.5 mg Intravenous Q8H  . pantoprazole (PROTONIX) IV  40 mg Intravenous Q12H  .  potassium chloride  10 mEq Intravenous Q1 Hr x 3  . potassium chloride  10 mEq Intravenous Q1 Hr x 3  . QUEtiapine  25 mg Oral QHS   . sodium chloride 75 mL/hr at 04/23/16 [id number]   Assessment/Plan Small bowel obstruction, ischemic and infarcted small bowel s/p Exploratory laparotomy, Lysis of adhesions, Small bowel resection (proximal ileum), 04/20/16, Dr. Darnell Levelodd Gerkin - POD 3 - patient removed NG tube  Hypokalemia - being replaced by Medicine ABLA - Hg 10, continue to monitor Alzheimer's disease Sick sinus syndrome/Chronic atrial fibrillation/PTVP [employer] 01/07/11 S/p CABG6/2007 Essential hypertension Carotid artery stenosis, asymptomatic HLD (hyperlipidemia)  FEN: IV fluids/NPO except few sips/chips DVT: Lovenox ID: Invanz pre op     Plan:  Sips of clears from the floor and hold if she has nausea.  She is getting up and PT has seen her and started Rx.       LOS: 4 days    Tiffine Henigan 04/23/2016 [phone]

## 2016-04-23 NOTE — Progress Notes (Signed)
PROGRESS NOTE    Valerie Carr  ZOX:[id number] DOB: Mar 28, 1929 DOA: 04/18/2016 PCP: [name],[name], MD   Brief Narrative:  Valerie Carr is a 83 y.o. female with medical history significant of dementia, coronary artery disease status post CABG, atrial fibrillation status post Maze procedure and sick sinus syndrome status post pacemaker, hypertension, hyperlipidemia, type 2 diabetes mellitus, presents to the emergency room with chief complaint of abdominal pain, as well as nausea vomiting. In the ED, patient's vital signs are stable, she had blood work done which was fairly unremarkable except for a glucose of 216 and a potassium of 3.0, her creatinine is 1.1, she underwent a CT scan of the abdomen and pelvis which showed possible small bowel obstruction as well as a AAA.  TRH was asked to admit because of her multiple medical comorbidities andGeneral surgery was consulted and she was found to have a small bowel obstruction with ischemic and infarcted bowel. She underwent an Exploratory Laparotomy with lysis of adhesions and small bowel resection on 04/20/16. She remains confused and has pulled out her NG tube x2. She was found to be hypokalemic and diet being advanced per Surgery.   Assessment & Plan:   Active Problems:   Alzheimer's disease   Sick sinus syndrome (HCC)   Chronic atrial fibrillation (HCC)   Cardiac pacemaker in situ   Coronary artery disease involving coronary bypass graft of native heart without angina pectoris   HLD (hyperlipidemia)   Essential hypertension   Carotid artery stenosis, asymptomatic   SBO (small bowel obstruction)  Small bowel obstruction (h/o abdominal hysterectomy, appendectomy), ischemic and infarcted small bowel s/p Exploratory laparotomy. Lysis of adhesions. Small bowel resection (proximal ileum). On 3/25 by general surgery Dr Gerrit FriendsGerkin POD 3 -She pulled off NG tube twice, she reported passing gas, less pain, no n/v, NG was not placed back, she is  started on ice chips and sip of water on 3/27 per general surgery -She is continued on Ice Chips and Sips with Meds -Continue IVF with NS at 75 mL -Will follow general surgery recommendations.  Cholelithiasis on CTAb/pel:  -lfts wnl -General Surgery Following  AKI on Chronic kidney disease stage III -Patient's GFR has been in stage III range at least since 2015,  -Urinalysis does showed glucose, but no infection -Worsening of cr was likely from sbo, dehydration -Creatinine peaked at 1.5 on 3/25, cr improved post op, cr 0.79 on 3/28,  -continue monitor and continue with gentle IVF with NS at 50 mL/hr -Renal dosing meds  Hypomagnesemia:  -Replaced Mag and now 1.9 -Repeat Mag Level in AM  Hypokalemia -Patient's Potassium was 2.8 -Replete with 2 Runs of IV KCl 30 mEQ -Repeat CMP in AM  Coronary artery disease status post CABG -No chest pain, this appears to be stable, currently npo, oral meds held  Sick sinus syndrome/A. fib status post Maze procedure and now with pacemaker -Monitor on telemetry., her EKG shows paced rhythm. -Not on anticoagulation probably due to age/dementia, she is on full dose aspirin. oral meds held because patient was NPO; Diet being advanced to Sips with Meds by General Surgery - CHADSVASC score at least 2  Hypertension -Resume home medications when able to take oral meds,  -on IV Lopressor 2.5 mg IV q8h, and  IV hydralazine 10 mg IV q6hprn for SBP >180 -Transition to oral meds when able to take po consistently  Noninsulin dependent Diabetes mellitus type II -Hba1c 7 -She is on metformin at home -Patient on Sensitive Novolog  sliding TID.   Abdominal Aortic Aneurysm to max of 4.2 cm -This was present at least on a CT scan dated in 2010 -so doubt acute or new -Continue to Follow as an outpatient   Alzheimer's Dementia -She has underlying confusion, (not oriented to time or place today),  -Resume home medications when able to take oral  meds -C/w Seroquel 25 mg po qHS  Severe Malnutrition: -Daughter report significant weight loss in the last few months 15 pounds -Nutrition consult when able to have oral intake  FTT/frequent falls:  -she has lived at a independent living for the past 9 years -will need SNF at discharge per PT recommendations  Acute Blood Loss Anemia from Surgery -Hb/Hct Trending down and went from 11.1/32.4 on 3/26 -> 9.2/26.3 -Continue to Monitor -Partly dilutional drop -Repeat CBC in AM  DVT prophylaxis: Lovenox 40 mg sq q24h Code Status: DO NOT RESUSCITATE Family Communication: No family present at bedside Disposition Plan: SNF at D/C when medically Stable  Consultants:   [facility]   Procedures:  s/p ex lap, Lysis of Adhesions and small bowel resection on 3/25    Antimicrobials:  Anti-infectives    Start     Dose/Rate Route Frequency Ordered Stop   04/20/16 1100  ertapenem (INVANZ) 1 g in sodium chloride 0.9 % 50 mL IVPB     1 g 100 mL/hr over 30 Minutes Intravenous On call to O.R. 04/20/16 1052 04/20/16 1259     Subjective: Still confused. Had no complaints except abdominal pain. Not nauseous and was NPO this AM. No other concerns or complaints.   Objective: Vitals:   04/22/16 1107 04/22/16 1434 04/22/16 2037 04/23/16 0607  BP: (!) 139/49 (!) 141/57 (!) 124/46 (!) 129/41  Pulse: 82 79 80 81  Resp: 14 16 18 18   Temp: 99.1 F (37.3 C) 99.1 F (37.3 C) 99.6 F (37.6 C) 99.8 F (37.7 C)  TempSrc: Oral Oral Oral Oral  SpO2: 96% 97% 95% 99%  Weight:      Height:        Intake/Output Summary (Last 24 hours) at 04/23/16 0801 Last data filed at 04/23/16 0600  Gross per 24 hour  Intake             1830 ml  Output              535 ml  Net             1295 ml   Filed Weights   04/18/16 0924  Weight: 58.1 kg (128 lb)   Examination: Physical Exam:  Constitutional:  NAD and appears calm and comfortable but confused Eyes: Lids and conjunctivae  normal, sclerae anicteric  ENMT: External Ears, Nose appear normal. Grossly normal hearing.  Neck: Appears normal, supple, no cervical masses, normal ROM, no appreciable thyromegaly, no JVD Respiratory: Diminished to auscultation bilaterally, no wheezing, rales, rhonchi or crackles. Normal respiratory effort and patient is not tachypenic. No accessory muscle use.  Cardiovascular: RRR, no murmurs / rubs / gallops. S1 and S2 auscultated. No extremity edema.   Abdomen: Soft, mildly tender, non-distended. No masses palpated. No appreciable hepatosplenomegaly. Bowel sounds positive x4. Midline Abdominal Staples and Incisions C/D/I.  GU: Deferred. Musculoskeletal: No clubbing / cyanosis of digits/nails. No joint deformity upper and lower extremities. .  Skin: No rashes, lesions, ulcers. No induration; Warm and dry.  Neurologic: CN 2-12 grossly intact with no focal deficits. Romberg sign cerebellar reflexes not assessed.  Psychiatric: Impaired judgment and insight.  Alert and awake but not oriented. Normal mood and appropriate affect.   Data Reviewed: I have personally reviewed following labs and imaging studies  CBC:  Recent Labs Lab 04/18/16 1011 04/19/16 0529 04/20/16 0517 04/21/16 0350 04/22/16 0335 04/23/16 0547  WBC 6.0 15.9* 14.9* 11.5* 9.8 6.0  NEUTROABS 5.1  --  11.5*  --   --   --   HGB 12.5 14.4 13.2 11.1* 10.0* 9.2*  HCT 36.5 41.8 39.1 32.4* 29.3* 26.3*  MCV 85.7 84.6 87.1 87.3 86.9 85.1  PLT 178 278 228 183 194 186   Basic Metabolic Panel:  Recent Labs Lab 04/19/16 0529 04/20/16 0517 04/21/16 0350 04/22/16 0335 04/23/16 0547  NA 137 136 134* 134* 137  K 3.9 4.6 4.6 3.9 2.8*  CL 106 107 107 104 104  CO2 21* 23 24 25 26   GLUCOSE 324* 245* 234* 131* 99  BUN 26* 36* 30* 24* 15  CREATININE 1.12* 1.53* 1.05* 0.96 0.79  CALCIUM 8.5* 8.7* 8.3* 8.2* 7.9*  MG  --  2.1  --  1.6* 1.9   GFR: Estimated Creatinine Clearance: 41.4 mL/min (by C-G formula based on SCr of 0.79  mg/dL). Liver Function Tests:  Recent Labs Lab 04/18/16 1011 04/19/16 0529 04/20/16 0517  AST 18 17 12*  ALT 13* 10* 8*  ALKPHOS 44 40 39  BILITOT 0.7 0.7 0.5  PROT 8.2* 6.8 6.3*  ALBUMIN 4.7 3.5 3.2*    Recent Labs Lab 04/18/16 1011  LIPASE 38   No results for input(s): AMMONIA in the last 168 hours. Coagulation Profile: No results for input(s): INR, PROTIME in the last 168 hours. Cardiac Enzymes: No results for input(s): CKTOTAL, CKMB, CKMBINDEX, TROPONINI in the last 168 hours. BNP (last 3 results) No results for input(s): PROBNP in the last 8760 hours. HbA1C: No results for input(s): HGBA1C in the last 72 hours. CBG:  Recent Labs Lab 04/21/16 2155 04/22/16 0800 04/22/16 1146 04/22/16 1658 04/22/16 2036  GLUCAP 118* 127* 119* 106* 95   Lipid Profile: No results for input(s): CHOL, HDL, LDLCALC, TRIG, CHOLHDL, LDLDIRECT in the last 72 hours. Thyroid Function Tests: No results for input(s): TSH, T4TOTAL, FREET4, T3FREE, THYROIDAB in the last 72 hours. Anemia Panel: No results for input(s): VITAMINB12, FOLATE, FERRITIN, TIBC, IRON, RETICCTPCT in the last 72 hours. Sepsis Labs:  Recent Labs Lab 04/18/16 1905 04/20/16 0517  LATICACIDVEN 1.8 1.6    Recent Results (from the past 240 hour(s))  MRSA PCR Screening     Status: None   Collection Time: 04/18/16  3:17 PM  Result Value Ref Range Status   MRSA by PCR NEGATIVE NEGATIVE Final    Comment:        The GeneXpert MRSA Assay (FDA approved for NASAL specimens only), is one component of a comprehensive MRSA colonization surveillance program. It is not intended to diagnose MRSA infection nor to guide or monitor treatment for MRSA infections.     Radiology Studies: No results found.  Scheduled Meds: . enoxaparin (LOVENOX) injection  40 mg Subcutaneous Q24H  . insulin aspart  0-9 Units Subcutaneous TID WC  . mouth rinse  15 mL Mouth Rinse BID  . metoprolol  2.5 mg Intravenous Q8H  . pantoprazole  (PROTONIX) IV  40 mg Intravenous QHS  . QUEtiapine  25 mg Oral QHS   Continuous Infusions: . sodium chloride 75 mL/hr at 04/23/16 0027    LOS: 4 days   Merlene Laughter, DO [facility] Pager [phone]  If 7PM-7AM, please  contact night-coverage [email or web address] Password [email or web address] 04/23/2016, 8:01 AM

## 2016-04-24 ENCOUNTER — Encounter (HOSPITAL_COMMUNITY): Payer: Self-pay | Admitting: Surgery

## 2016-04-24 DIAGNOSIS — R1033 Periumbilical pain: Secondary | ICD-10-CM

## 2016-04-24 DIAGNOSIS — E876 Hypokalemia: Secondary | ICD-10-CM

## 2016-04-24 LAB — COMPREHENSIVE METABOLIC PANEL
ALBUMIN: 2.5 g/dL — AB (ref 3.5–5.0)
ALK PHOS: 41 U/L (ref 38–126)
ALK PHOS: 42 U/L (ref 38–126)
ALT: 16 U/L (ref 14–54)
ALT: 18 U/L (ref 14–54)
ANION GAP: 8 (ref 5–15)
ANION GAP: 5 (ref 5–15)
AST: 18 U/L (ref 15–41)
AST: 22 U/L (ref 15–41)
Albumin: 2.7 g/dL — ABNORMAL LOW (ref 3.5–5.0)
BILIRUBIN TOTAL: 1 mg/dL (ref 0.3–1.2)
BUN: 9 mg/dL (ref 6–20)
BUN: 6 mg/dL (ref 6–20)
CALCIUM: 7.7 mg/dL — AB (ref 8.9–10.3)
CALCIUM: 7.6 mg/dL — AB (ref 8.9–10.3)
CO2: 24 mmol/L (ref 22–32)
CO2: 27 mmol/L (ref 22–32)
Chloride: 103 mmol/L (ref 101–111)
Chloride: 102 mmol/L (ref 101–111)
Creatinine, Ser: 0.84 mg/dL (ref 0.44–1.00)
Creatinine, Ser: 0.8 mg/dL (ref 0.44–1.00)
GFR calc Af Amer: >60 mL/min (ref >60)
GFR calc non Af Amer: >60 mL/min (ref >60)
GLUCOSE: 166 mg/dL — AB (ref 65–99)
Glucose, Bld: 170 mg/dL — ABNORMAL HIGH (ref 65–99)
Potassium: 3.2 mmol/L — ABNORMAL LOW (ref 3.5–5.1)
Potassium: 3.4 mmol/L — ABNORMAL LOW (ref 3.5–5.1)
SODIUM: 134 mmol/L — AB (ref 135–145)
Sodium: 135 mmol/L (ref 135–145)
TOTAL PROTEIN: 5.5 g/dL — AB (ref 6.5–8.1)
Total Bilirubin: 0.8 mg/dL (ref 0.3–1.2)
Total Protein: 6.1 g/dL — ABNORMAL LOW (ref 6.5–8.1)

## 2016-04-24 LAB — CBC
HEMATOCRIT: 25.4 % — AB (ref 36.0–46.0)
HEMOGLOBIN: 9.1 g/dL — AB (ref 12.0–15.0)
MCH: 30.2 pg (ref 26.0–34.0)
MCHC: 35.8 g/dL (ref 30.0–36.0)
MCV: 84.4 fL (ref 78.0–100.0)
Platelets: 227 10*3/uL (ref 150–400)
RBC: 3.01 MIL/uL — ABNORMAL LOW (ref 3.87–5.11)
RDW: 13.3 % (ref 11.5–15.5)
WBC: 5.8 10*3/uL (ref 4.0–10.5)

## 2016-04-24 LAB — GLUCOSE, CAPILLARY
GLUCOSE-CAPILLARY: 116 mg/dL — AB (ref 65–99)
GLUCOSE-CAPILLARY: 142 mg/dL — AB (ref 65–99)
GLUCOSE-CAPILLARY: 140 mg/dL — AB (ref 65–99)
Glucose-Capillary: 177 mg/dL — ABNORMAL HIGH (ref 65–99)
Glucose-Capillary: 163 mg/dL — ABNORMAL HIGH (ref 65–99)

## 2016-04-24 LAB — PHOSPHORUS: Phosphorus: 1.3 mg/dL — ABNORMAL LOW (ref 2.5–4.6)

## 2016-04-24 LAB — MAGNESIUM: Magnesium: 1.6 mg/dL — ABNORMAL LOW (ref 1.7–2.4)

## 2016-04-24 MED ORDER — POTASSIUM CHLORIDE 10 MEQ/100ML IV SOLN
10.0000 meq | INTRAVENOUS | Status: AC
  Administered 2016-04-24 (×2): 10 meq via INTRAVENOUS
  Filled 2016-04-24 (×3): qty 100

## 2016-04-24 MED ORDER — TRAMADOL HCL 50 MG PO TABS
50.0000 mg | ORAL_TABLET | Freq: Four times a day (QID) | ORAL | Status: DC | PRN
Start: 2016-04-24 — End: 2016-04-25
  Administered 2016-04-24: 50 mg via ORAL
  Filled 2016-04-24: qty 1

## 2016-04-24 MED ORDER — ACETAMINOPHEN 325 MG PO TABS
650.0000 mg | ORAL_TABLET | Freq: Four times a day (QID) | ORAL | Status: DC | PRN
  Administered 2016-04-24: 650 mg via ORAL
  Filled 2016-04-24: qty 2

## 2016-04-24 MED ORDER — POTASSIUM CHLORIDE 10 MEQ/100ML IV SOLN
10.0000 meq | Freq: Once | INTRAVENOUS | Status: AC
  Administered 2016-04-24: 10 meq via INTRAVENOUS
  Filled 2016-04-24: qty 100

## 2016-04-24 MED ORDER — ACETAMINOPHEN 325 MG PO TABS
650.0000 mg | ORAL_TABLET | ORAL | Status: DC
  Administered 2016-04-24 – 2016-04-25 (×5): 650 mg via ORAL
  Filled 2016-04-24 (×5): qty 2

## 2016-04-24 MED ORDER — SODIUM CHLORIDE 0.9 % IV SOLN: 30.0000 meq | Freq: Once | INTRAVENOUS | Status: DC

## 2016-04-24 MED ORDER — MAGNESIUM SULFATE 2 GM/50ML IV SOLN
2.0000 g | Freq: Once | INTRAVENOUS | Status: AC
  Administered 2016-04-24: 2 g via INTRAVENOUS
  Filled 2016-04-24: qty 50

## 2016-04-24 NOTE — Progress Notes (Signed)
4 Days Post-Op  Subjective: Complains of hurting. Tolerating sips of clears, says she is passing gas.  Getting up to BR.  Site looks fine and she has BS.  Objective: Vital signs in last 24 hours: Temp:  [98.2 F (36.8 C)-99.8 F (37.7 C)] 99.3 F (37.4 C) (03/29 0611) Pulse Rate:  [81-85] 81 (03/29 0611) Resp:  [18] 18 (03/29 0611) BP: (134-159)/(44-70) 134/44 (03/29 0611) SpO2:  [95 %-97 %] 95 % (03/29 0611) Last BM Date: 04/18/16 (preop) 220 PO 2200 IV 400 urine recorded Afebrile, VSS K+ 3.2 Mag 1.6 H/H is stable  Intake/Output from previous day: 03/28 0701 - 03/29 0700 In: 2557.5 [P.O.:220; I.V.:1737.5; IV Piggyback:600] Out: 400 [Urine:400] Intake/Output this shift: Total I/O In: -  Out: 300 [Urine:300]  General appearance: alert, cooperative and no distress Resp: clear to auscultation bilaterally GI: soft sore, + BS, + flatus.  Incision looks fine.  Lab Results:   Recent Labs  04/23/16 0547 04/24/16 0553  WBC 6.0 5.8  HGB 9.2* 9.1*  HCT 26.3* 25.4*  PLT 186 227    BMET  Recent Labs  04/23/16 0547 04/24/16 0553  NA 137 135  K 2.8* 3.2*  CL 104 103  CO2 26 24  GLUCOSE 99 166*  BUN 15 9  CREATININE 0.79 0.84  CALCIUM 7.9* 7.7*   PT/INR No results for input(s): LABPROT, INR in the last 72 hours.   Recent Labs Lab 04/18/16 1011 04/19/16 0529 04/20/16 0517 04/24/16 0553  AST 18 17 12* 18  ALT 13* 10* 8* 16  ALKPHOS 44 40 39 41  BILITOT 0.7 0.7 0.5 1.0  PROT 8.2* 6.8 6.3* 5.5*  ALBUMIN 4.7 3.5 3.2* 2.5*     Lipase     Component Value Date/Time   LIPASE 38 04/18/2016 1011     Studies/Results: No results found. Prior to Admission medications   Medication Sig Start Date End Date Taking? Authorizing Provider  aspirin 325 MG tablet Take 325 mg by mouth daily.   Yes Historical Provider, MD  donepezil (ARICEPT) 10 MG tablet TAKE 1 TABLET (10 MG TOTAL) BY MOUTH AT BEDTIME. 06/27/15  Yes Marvel Plan, MD  DULoxetine (CYMBALTA) 60 MG  capsule Take 60 mg by mouth daily.    Yes Historical Provider, MD  memantine (NAMENDA) 10 MG tablet Take 1 tablet (10 mg total) by mouth 2 (two) times daily. 01/11/13  Yes Ronal Fear, NP  metFORMIN (GLUCOPHAGE-XR) 500 MG 24 hr tablet Take 500 mg by mouth daily with breakfast.     Yes Historical Provider, MD  omeprazole (PRILOSEC) 40 MG capsule Take 40 mg by mouth daily. 02/28/15  Yes Historical Provider, MD  ONE TOUCH ULTRA TEST test strip TEST DAILY DX E11.51 06/19/14  Yes Historical Provider, MD  QUEtiapine (SEROQUEL) 25 MG tablet Take 1 tablet (25 mg total) by mouth every morning. 11/08/15  Yes Marvel Plan, MD  rosuvastatin (CRESTOR) 20 MG tablet Take 20 mg by mouth daily. 03/21/16  Yes Historical Provider, MD  traZODone (DESYREL) 100 MG tablet Take 100 mg by mouth at bedtime.     Yes Historical Provider, MD  valsartan (DIOVAN) 160 MG tablet Take 160 mg by mouth daily.   Yes Historical Provider, MD    Medications: . enoxaparin (LOVENOX) injection  40 mg Subcutaneous Q24H  . insulin aspart  0-9 Units Subcutaneous TID WC  . mouth rinse  15 mL Mouth Rinse BID  . metoprolol  2.5 mg Intravenous Q8H  . pantoprazole (PROTONIX) IV  40 mg Intravenous Q12H  . potassium chloride  10 mEq Intravenous Q1 Hr x 3    Assessment/Plan Small bowel obstruction, ischemic and infarcted small bowel s/p Exploratory laparotomy, Lysis of adhesions, Small bowel resection (proximal ileum), 04/20/16, Dr. Darnell Levelodd Gerkin - POD 4 - patient removed NG tube  Hypokalemia /Hypomagnesemia - being replaced by Medicine Anemia - Hg 10,  Alzheimer's disease Sick sinus syndrome/Chronic atrial fibrillation/PTVP [employer] 01/07/11 S/p CABG6/2007 Essential hypertension Carotid artery stenosis, asymptomatic HLD (hyperlipidemia) FEN: IV fluids/NPO except few sips/chips DVT: Lovenox ID: Invanz pre op  Plan:  Clear liquids and some PO pain meds. Get her back on some of her meds for dementia.  PT ordered.          LOS: 5 days    Valerie Carr 04/24/2016 [phone]

## 2016-04-24 NOTE — Progress Notes (Signed)
Critical troponin 0.03. NP Donnamarie PoagK. Kirby notified.

## 2016-04-24 NOTE — Progress Notes (Signed)
Pt will discharge to SNF, CSW will follow.

## 2016-04-24 NOTE — Progress Notes (Signed)
qPhysical Therapy Treatment Patient Details Name: Valerie Carr MRN: [id number] DOB: 12-18-1929 Today's Date: 04/24/2016    History of Present Illness Valerie Carr is a 83 y.o. female with medical history significant of dementia, coronary artery disease status post CABG, atrial fibrillation , sick sinus syndrome status post pacemaker, hypertension, hyperlipidemia, type 2 diabetes mellitus, presents to the emergency room with chief complaint of abdominal pain, as well as nausea vomiting; , S/P exCT + possible SBO,S/P explaratory lap 3/25    PT Comments    POD # 4 LAP Assisted OOB to amb a limited distance due to pain and fatigue.   Follow Up Recommendations  SNF;Supervision/Assistance - 24 hour     Equipment Recommendations  None recommended by PT    Recommendations for Other Services       Precautions / Restrictions Precautions Precautions: Fall Precaution Comments: Hx Alzheimers Restrictions Weight Bearing Restrictions: No    Mobility  Bed Mobility Overal bed mobility: Needs Assistance Bed Mobility: Supine to Sit;Sit to Supine     Supine to sit: Min guard;Min assist Sit to supine: Min guard;Min assist   General bed mobility comments: repeat functional VC's and increased time.  pt pleasant and following commands  Transfers Overall transfer level: Needs assistance Equipment used: None Transfers: Sit to/from Stand Sit to Stand: Min assist;Mod assist         General transfer comment: 50% VC's on proper hand placement to avoid pulling up on walker and safety with turn completion.   Ambulation/Gait Ambulation/Gait assistance: Mod assist;+2 physical assistance;+2 safety/equipment Ambulation Distance (Feet): 15 Feet Assistive device: Rolling walker (2 wheeled) Gait Pattern/deviations: Step-to pattern;Step-through pattern;Trunk flexed;Drifts right/left Gait velocity: decreased   General Gait Details: + 2 assist for safety and guidance with walker.  Distance  limited by ABD pain and fatigue.    Stairs            Wheelchair Mobility    Modified Rankin (Stroke Patients Only)       Balance                                            Cognition Arousal/Alertness: Awake/alert Behavior During Therapy: WFL for tasks assessed/performed Overall Cognitive Status: Within Functional Limits for tasks assessed                                        Exercises      General Comments        Pertinent Vitals/Pain Pain Assessment: Faces Faces Pain Scale: Hurts even more Pain Location: low ABD "I had surgery" Pain Descriptors / Indicators: Discomfort;Grimacing;Guarding;Squeezing Pain Intervention(s): Monitored during session    Home Living                      Prior Function            PT Goals (current goals can now be found in the care plan section) Progress towards PT goals: Progressing toward goals    Frequency    Min 3X/week      PT Plan Current plan remains appropriate    Co-evaluation             End of Session Equipment Utilized During Treatment: Gait belt Activity Tolerance: Patient limited by pain;Patient limited by fatigue  Patient left: in bed;with call bell/phone within reach;with bed alarm set;with family/visitor present Nurse Communication: Mobility status PT Visit Diagnosis: Unsteadiness on feet (R26.81);Pain     Time: 1610-9604 PT Time Calculation (min) (ACUTE ONLY): 13 min  Charges:  $Gait Training: 8-22 mins                    G Codes:       Felecia Shelling  PTA [facility]  Acute  Rehab Pager      [phone]

## 2016-04-24 NOTE — Progress Notes (Signed)
PROGRESS NOTE    Valerie Carr  ZOX:[id number] DOB: 1929/04/04 DOA: 04/18/2016 PCP: Cala Bradford, MD   Brief Narrative:  Valerie Carr is a 83 y.o. female with medical history significant of dementia, coronary artery disease status post CABG, atrial fibrillation status post Maze procedure and sick sinus syndrome status post pacemaker, Hypertension, Hyperlipidemia, Type 2 diabetes Mellitus, presents to the emergency room with chief complaint of abdominal pain, as well as nausea vomiting. In the ED, patient's vital signs are stable, she had blood work done which was fairly unremarkable except for a glucose of 216 and a potassium of 3.0, her creatinine is 1.1, she underwent a CT scan of the abdomen and pelvis which showed possible small bowel obstruction as well as a AAA.  TRH was asked to admit because of her multiple medical comorbidities andGeneral surgery was consulted and she was found to have a small bowel obstruction with ischemic and infarcted bowel. She underwent an Exploratory Laparotomy with lysis of adhesions and small bowel resection on 04/20/16. She remains confused and has pulled out her NG tube x2. She was found to be hypokalemic and diet being advanced per Surgery.   Assessment & Plan:   Active Problems:   Alzheimer's disease   Sick sinus syndrome (HCC)   Chronic atrial fibrillation (HCC)   Cardiac pacemaker in situ   Coronary artery disease involving coronary bypass graft of native heart without angina pectoris   HLD (hyperlipidemia)   Essential hypertension   Carotid artery stenosis, asymptomatic   SBO (small bowel obstruction)  Small bowel obstruction (h/o abdominal hysterectomy, appendectomy), ischemic and infarcted small bowel s/p Exploratory laparotomy. Lysis of adhesions. Small bowel resection (proximal ileum). On 3/25 by general surgery Dr Gerrit Friends POD 4 -She pulled off NG tube twice, she reported passing gas, less pain, no n/v, NG was not placed back, she is  started on ice chips and sip of water on 3/27 per general surgery -She is continued on Ice Chips and Sips with Meds and per General Surgery will advance Diet as tolerated -Continue IVF with NS at 75 mL -Will follow general surgery recommendations. -Pain Control with Acetaminophen and IV Dilaudid  Cholelithiasis on CTAb/pel:  -lfts wnl -General Surgery Following  AKI on Chronic kidney disease stage III -Patient's GFR has been in stage III range at least since 2015,  -Urinalysis does showed glucose, but no infection -Worsening of cr was likely from sbo, dehydration -Creatinine peaked at 1.5 on 3/25, cr improved post op, cr 0.84 on 3/39,  -continue monitor and continue with gentle IVF with NS at 50 mL/hr -Renal dosing meds  Hypomagnesemia:  -Replaced Mag and now 1.6 -Repeat Mag Level in AM  Hypokalemia -Patient's Potassium was 3.2 -Replete with of IV KCl 30 mEQ and Patient is on  -Repeat CMP in AM  Coronary artery disease status post CABG -No chest pain, this appears to be stable, currently npo, oral meds held  Sick sinus syndrome/A. fib status post Maze procedure and now with pacemaker -Monitor on telemetry., her EKG shows paced rhythm. -Not on anticoagulation probably due to age/dementia, she is on full dose aspirin. oral meds held because patient was NPO; Diet being advanced to Sips with Meds by General Surgery - CHADSVASC score at least 2  Hypertension -Resume home medications when able to take oral meds,  -on IV Lopressor 2.5 mg IV q8h, and  IV hydralazine 10 mg IV q6hprn for SBP >180 -Transition to oral meds when able to take po consistently  Noninsulin dependent Diabetes mellitus type II -Hba1c 7 -She is on metformin at home -Patient on Sensitive Novolog sliding TID.   Abdominal Aortic Aneurysm to max of 4.2 cm -This was present at least on a CT scan dated in 2010 -so doubt acute or new -Continue to Follow as an outpatient   Alzheimer's Dementia -She  has underlying confusion, (not oriented to time or place today),  -Resume home medications when able to take oral meds -C/w Seroquel 25 mg po qHS  Severe Malnutrition: -Daughter report significant weight loss in the last few months 15 pounds -Nutrition consult when able to have oral intake  FTT/frequent falls:  -she has lived at a independent living for the past 9 years -will need SNF at discharge per PT recommendations  Acute Blood Loss Anemia from Surgery -Hb/Hct down to now 9.1/25.4 -Continue to Monitor -Partly dilutional drop -Repeat CBC in AM  DVT prophylaxis: Lovenox 40 mg sq q24h Code Status: DO NOT RESUSCITATE Family Communication: No family present at bedside Disposition Plan: SNF at D/C when medically Stable  Consultants:   [facility]   Procedures:  s/p ex lap, Lysis of Adhesions and small bowel resection on 3/25    Antimicrobials:  Anti-infectives    Start     Dose/Rate Route Frequency Ordered Stop   04/20/16 1100  ertapenem (INVANZ) 1 g in sodium chloride 0.9 % 50 mL IVPB     1 g 100 mL/hr over 30 Minutes Intravenous On call to O.R. 04/20/16 1052 04/20/16 1259     Subjective: Patient was more with it today and complaining of Abdominal Pain. States she was able to drink orally without nausea. Had no other complaints or concerns at this time.    Objective: Vitals:   04/23/16 1439 04/23/16 2049 04/24/16 0611 04/24/16 1432  BP: (!) 159/70 (!) 149/64 (!) 134/44 (!) 153/52  Pulse: 83 85 81 73  Resp: 18 18 18 18   Temp: 99.8 F (37.7 C) 98.2 F (36.8 C) 99.3 F (37.4 C) 97.5 F (36.4 C)  TempSrc: Oral Oral Oral Oral  SpO2: 97% 96% 95% 99%  Weight:      Height:        Intake/Output Summary (Last 24 hours) at 04/24/16 2158 Last data filed at 04/24/16 1746  Gross per 24 hour  Intake          1008.75 ml  Output              825 ml  Net           183.75 ml   Filed Weights   04/18/16 0924  Weight: 58.1 kg (128 lb)    Examination: Physical Exam:  Constitutional:  NAD and appears calm and comfortable but confused Eyes: Lids and conjunctivae normal, sclerae anicteric  ENMT: External Ears, Nose appear normal. Grossly normal hearing.  Neck: Appears normal, supple, no cervical masses, normal ROM, no appreciable thyromegaly, no JVD Respiratory: Diminished to auscultation bilaterally, no wheezing, rales, rhonchi or crackles. Normal respiratory effort and patient is not tachypenic. No accessory muscle use.  Cardiovascular: RRR, no murmurs / rubs / gallops. S1 and S2 auscultated. No extremity edema.   Abdomen: Soft, mildly tender, non-distended. No masses palpated. No appreciable hepatosplenomegaly. Bowel sounds positive x4. Midline Abdominal Staples and Incisions C/D/I. No oozing noted.  GU: Deferred. Musculoskeletal: No clubbing / cyanosis of digits/nails. No joint deformity upper and lower extremities. .  Skin: No rashes, lesions, ulcers. No induration; Warm and dry.  Neurologic:  CN 2-12 grossly intact with no focal deficits. Romberg sign cerebellar reflexes not assessed.  Psychiatric: Impaired judgment and insight. Alert and awake but not oriented. Normal mood and appropriate affect.   Data Reviewed: I have personally reviewed following labs and imaging studies  CBC:  Recent Labs Lab 04/18/16 1011  04/20/16 0517 04/21/16 0350 04/22/16 0335 04/23/16 0547 04/24/16 0553  WBC 6.0  < > 14.9* 11.5* 9.8 6.0 5.8  NEUTROABS 5.1  --  11.5*  --   --   --   --   HGB 12.5  < > 13.2 11.1* 10.0* 9.2* 9.1*  HCT 36.5  < > 39.1 32.4* 29.3* 26.3* 25.4*  MCV 85.7  < > 87.1 87.3 86.9 85.1 84.4  PLT 178  < > 228 183 194 186 227  < > = values in this interval not displayed. Basic Metabolic Panel:  Recent Labs Lab 04/20/16 0517 04/21/16 0350 04/22/16 0335 04/23/16 0547 04/24/16 0553  NA 136 134* 134* 137 135  K 4.6 4.6 3.9 2.8* 3.2*  CL 107 107 104 104 103  CO2 23 24 25 26 24   GLUCOSE 245* 234* 131* 99 166*   BUN 36* 30* 24* 15 9  CREATININE 1.53* 1.05* 0.96 0.79 0.84  CALCIUM 8.7* 8.3* 8.2* 7.9* 7.7*  MG 2.1  --  1.6* 1.9 1.6*  PHOS  --   --   --   --  1.3*   GFR: Estimated Creatinine Clearance: 39.4 mL/min (by C-G formula based on SCr of 0.84 mg/dL). Liver Function Tests:  Recent Labs Lab 04/18/16 1011 04/19/16 0529 04/20/16 0517 04/24/16 0553  AST 18 17 12* 18  ALT 13* 10* 8* 16  ALKPHOS 44 40 39 41  BILITOT 0.7 0.7 0.5 1.0  PROT 8.2* 6.8 6.3* 5.5*  ALBUMIN 4.7 3.5 3.2* 2.5*    Recent Labs Lab 04/18/16 1011  LIPASE 38   No results for input(s): AMMONIA in the last 168 hours. Coagulation Profile: No results for input(s): INR, PROTIME in the last 168 hours. Cardiac Enzymes:  Recent Labs Lab 04/23/16 1001 04/23/16 1542 04/23/16 2221  TROPONINI <0.03 <0.03 0.03*   BNP (last 3 results) No results for input(s): PROBNP in the last 8760 hours. HbA1C: No results for input(s): HGBA1C in the last 72 hours. CBG:  Recent Labs Lab 04/23/16 1718 04/23/16 2040 04/24/16 0744 04/24/16 1206 04/24/16 1653  GLUCAP 70 116* 177* 163* 142*   Lipid Profile: No results for input(s): CHOL, HDL, LDLCALC, TRIG, CHOLHDL, LDLDIRECT in the last 72 hours. Thyroid Function Tests: No results for input(s): TSH, T4TOTAL, FREET4, T3FREE, THYROIDAB in the last 72 hours. Anemia Panel: No results for input(s): VITAMINB12, FOLATE, FERRITIN, TIBC, IRON, RETICCTPCT in the last 72 hours. Sepsis Labs:  Recent Labs Lab 04/18/16 1905 04/20/16 0517  LATICACIDVEN 1.8 1.6    Recent Results (from the past 240 hour(s))  MRSA PCR Screening     Status: None   Collection Time: 04/18/16  3:17 PM  Result Value Ref Range Status   MRSA by PCR NEGATIVE NEGATIVE Final    Comment:        The GeneXpert MRSA Assay (FDA approved for NASAL specimens only), is one component of a comprehensive MRSA colonization surveillance program. It is not intended to diagnose MRSA infection nor to guide or monitor  treatment for MRSA infections.     Radiology Studies: No results found.  Scheduled Meds: . acetaminophen  650 mg Oral Q4H  . enoxaparin (LOVENOX) injection  40 mg  Subcutaneous Q24H  . insulin aspart  0-9 Units Subcutaneous TID WC  . mouth rinse  15 mL Mouth Rinse BID  . metoprolol  2.5 mg Intravenous Q8H  . pantoprazole (PROTONIX) IV  40 mg Intravenous Q12H   Continuous Infusions: . dextrose 5 % and 0.9 % NaCl with KCl 40 mEq/L 75 mL/hr at 04/24/16 0628    LOS: 5 days   Merlene Laughter, DO [facility] Pager [phone]  If 7PM-7AM, please contact night-coverage [email or web address] Password [facility] 04/24/2016, 9:58 PM

## 2016-04-24 NOTE — Progress Notes (Signed)
Nutrition Follow-up  DOCUMENTATION CODES:   Not applicable  INTERVENTION:  - Diet advancement as medically feasible. - RD will continue to monitor for needs.  NUTRITION DIAGNOSIS:   Inadequate oral intake related to inability to eat as evidenced by NPO status. -ongoing  GOAL:   Patient will meet greater than or equal to 90% of their needs -unmet   MONITOR:   Diet advancement, Weight trends, Labs, I & O's  ASSESSMENT:   83 y.o. female with medical history significant of dementia, coronary artery disease status post CABG, atrial fibrillation status post Maze procedure and sick sinus syndrome status post pacemaker, hypertension, hyperlipidemia, type 2 diabetes mellitus, presents to the emergency room with chief complaint of abdominal pain, as well as nausea vomiting.  Patient has underlying dementia and there is no family at bedside so history is somewhat limited.  She basically tells me that she has been having abdominal pain for the past hour, and had one emesis episode today.  She does not know if she has had these symptoms in the past.   3/29 Pt remains NPO although able to have sips of clears from the floor stock. Pt removed NGT 3/26 PM and tube not replaced since that time. Pt still has not had a BM since day of admission. No new weight since admission. Will continue to monitor for ability to advance diet and provide interventions at that time.  Medications reviewed; sliding scale Novolog, 2 g IV Mg sulfate x1 rn today, 40 mg IV Protonix BID, 10 mEq IV KCl x6 runs yesterday and x3 runs today.  Labs reviewed; K: 3.2 mmol/L, Ca: 7.7 mg/dL, Phos: 1.3 mg/dL, Mg: 1.6 mg/dL.   IVF: D5-NS-40 mEq KCl @ 75 mL/hr (206 kcal).     3/26 - Pt with hx of Alzheimer's dementia and was unable to provide information. - She is currently in bilateral mitten and soft wrist restraints and asked why these were in place; shortly after rationale provided pt would ask this question again.  - No  family/visitors present at this time to provide PTA information.  - POD #1 ex lap with LOA and small bowel resection.  - NGT in place in R nare. No output noted at this time.  - Physical assessment shows mild muscle wasting to legs and no fat wasting.  - Feel that mild muscle wasting to legs is age-related rather than related to malnutrition.  - Per [facility] report, in the ED it was reported that pt has lost 15 lbs in the past 6 months.  - This is inconsistent with current weight which is consistent with most weights from 2015-present.  - From June 2017-October 2017, pt gained 13 lbs.  - She has subsequently lost 10 lbs (7% body weight), which is not significant for 5 month time frame.  - MD note states severe malnutrition but unable to confirm this based on ASPEN guidelines.    IVF: NS @ 75 mL/hr.    Diet Order:  Diet NPO time specified Except for: Ice Chips, Sips with Meds, Other (See Comments)  Skin:  Wound (see comment) (Abdominal incision from 3/25)  Last BM:  PTA/unknown  Height:   Ht Readings from Last 1 Encounters:  04/18/16 5\' 2"  (1.575 m)    Weight:   Wt Readings from Last 1 Encounters:  04/18/16 128 lb (58.1 kg)    Ideal Body Weight:  50 kg  BMI:  Body mass index is 23.41 kg/m.  Estimated Nutritional Needs:   Kcal:  1450-1630 (25-28 kcal/kg)  Protein:  65-75 grams  Fluid:  >/= 1.7 L/day  EDUCATION NEEDS:   No education needs identified at this time    Trenton Gammon, MS, RD, LDN, CNSC Inpatient Clinical Dietitian Pager # [phone] After hours/weekend pager # [phone]

## 2016-04-24 NOTE — Progress Notes (Signed)
CSW assisting with d/c planning. Pt / daughter have chosen [facility] for [facility]. CSW will continue to follow to assist with d/c planning to SNF.  Cori RazorJamie Eran Windish LCSW [phone]

## 2016-04-25 DIAGNOSIS — R2689 Other abnormalities of gait and mobility: Secondary | ICD-10-CM | POA: Diagnosis not present

## 2016-04-25 DIAGNOSIS — Z48815 Encounter for surgical aftercare following surgery on the digestive system: Secondary | ICD-10-CM | POA: Diagnosis not present

## 2016-04-25 DIAGNOSIS — G308 Other Alzheimer's disease: Secondary | ICD-10-CM | POA: Diagnosis not present

## 2016-04-25 DIAGNOSIS — Z950 Presence of cardiac pacemaker: Secondary | ICD-10-CM | POA: Diagnosis not present

## 2016-04-25 DIAGNOSIS — N179 Acute kidney failure, unspecified: Secondary | ICD-10-CM | POA: Diagnosis not present

## 2016-04-25 DIAGNOSIS — E119 Type 2 diabetes mellitus without complications: Secondary | ICD-10-CM | POA: Diagnosis not present

## 2016-04-25 DIAGNOSIS — R278 Other lack of coordination: Secondary | ICD-10-CM | POA: Diagnosis not present

## 2016-04-25 DIAGNOSIS — K56609 Unspecified intestinal obstruction, unspecified as to partial versus complete obstruction: Secondary | ICD-10-CM | POA: Diagnosis not present

## 2016-04-25 DIAGNOSIS — I482 Chronic atrial fibrillation: Secondary | ICD-10-CM | POA: Diagnosis not present

## 2016-04-25 DIAGNOSIS — I2510 Atherosclerotic heart disease of native coronary artery without angina pectoris: Secondary | ICD-10-CM | POA: Diagnosis not present

## 2016-04-25 DIAGNOSIS — K565 Intestinal adhesions [bands], unspecified as to partial versus complete obstruction: Secondary | ICD-10-CM | POA: Diagnosis not present

## 2016-04-25 DIAGNOSIS — I10 Essential (primary) hypertension: Secondary | ICD-10-CM | POA: Diagnosis not present

## 2016-04-25 DIAGNOSIS — E785 Hyperlipidemia, unspecified: Secondary | ICD-10-CM | POA: Diagnosis not present

## 2016-04-25 DIAGNOSIS — I495 Sick sinus syndrome: Secondary | ICD-10-CM | POA: Diagnosis not present

## 2016-04-25 DIAGNOSIS — G309 Alzheimer's disease, unspecified: Secondary | ICD-10-CM | POA: Diagnosis not present

## 2016-04-25 DIAGNOSIS — R109 Unspecified abdominal pain: Secondary | ICD-10-CM | POA: Diagnosis not present

## 2016-04-25 DIAGNOSIS — I4891 Unspecified atrial fibrillation: Secondary | ICD-10-CM | POA: Diagnosis not present

## 2016-04-25 DIAGNOSIS — F0280 Dementia in other diseases classified elsewhere without behavioral disturbance: Secondary | ICD-10-CM | POA: Diagnosis not present

## 2016-04-25 DIAGNOSIS — M6281 Muscle weakness (generalized): Secondary | ICD-10-CM | POA: Diagnosis not present

## 2016-04-25 DIAGNOSIS — R1033 Periumbilical pain: Secondary | ICD-10-CM | POA: Diagnosis not present

## 2016-04-25 LAB — CBC WITH DIFFERENTIAL/PLATELET
BASOS ABS: 0 10*3/uL (ref 0.0–0.1)
BASOS PCT: 0 %
Eosinophils Absolute: 0.2 10*3/uL (ref 0.0–0.7)
Eosinophils Relative: 4 %
HCT: 27.6 % — ABNORMAL LOW (ref 36.0–46.0)
Hemoglobin: 9.9 g/dL — ABNORMAL LOW (ref 12.0–15.0)
LYMPHS PCT: 14 %
Lymphs Abs: 0.8 10*3/uL (ref 0.7–4.0)
MCH: 30.1 pg (ref 26.0–34.0)
MCHC: 35.9 g/dL (ref 30.0–36.0)
MCV: 83.9 fL (ref 78.0–100.0)
MONO ABS: 1.1 10*3/uL — AB (ref 0.1–1.0)
Monocytes Relative: 18 %
Neutro Abs: 3.7 10*3/uL (ref 1.7–7.7)
Neutrophils Relative %: 64 %
PLATELETS: 261 10*3/uL (ref 150–400)
RBC: 3.29 MIL/uL — ABNORMAL LOW (ref 3.87–5.11)
RDW: 13.3 % (ref 11.5–15.5)
WBC: 5.8 10*3/uL (ref 4.0–10.5)

## 2016-04-25 LAB — MAGNESIUM: Magnesium: 1.8 mg/dL (ref 1.7–2.4)

## 2016-04-25 LAB — GLUCOSE, CAPILLARY
GLUCOSE-CAPILLARY: 202 mg/dL — AB (ref 65–99)
Glucose-Capillary: 188 mg/dL — ABNORMAL HIGH (ref 65–99)

## 2016-04-25 LAB — PHOSPHORUS: PHOSPHORUS: 1.3 mg/dL — AB (ref 2.5–4.6)

## 2016-04-25 MED ORDER — DONEPEZIL HCL 5 MG PO TABS
10.0000 mg | ORAL_TABLET | Freq: Every day | ORAL | Status: DC
  Filled 2016-04-25: qty 2

## 2016-04-25 MED ORDER — POTASSIUM PHOSPHATES 15 MMOLE/5ML IV SOLN
20.0000 mmol | Freq: Once | INTRAVENOUS | Status: AC
  Administered 2016-04-25: 20 mmol via INTRAVENOUS
  Filled 2016-04-25: qty 6.67

## 2016-04-25 MED ORDER — TRAMADOL HCL 50 MG PO TABS: 50.0000 mg | ORAL_TABLET | Freq: Four times a day (QID) | ORAL | 0 refills | Status: DC | PRN

## 2016-04-25 MED ORDER — POTASSIUM CHLORIDE 20 MEQ/15ML (10%) PO SOLN
40.0000 meq | Freq: Once | ORAL | Status: AC
  Administered 2016-04-25: 40 meq via ORAL
  Filled 2016-04-25: qty 30

## 2016-04-25 MED ORDER — MEMANTINE HCL 10 MG PO TABS
10.0000 mg | ORAL_TABLET | Freq: Two times a day (BID) | ORAL | Status: DC
  Administered 2016-04-25: 10 mg via ORAL
  Filled 2016-04-25: qty 1

## 2016-04-25 NOTE — Progress Notes (Signed)
qPhysical Therapy Treatment Patient Details Name: Valerie Carr MRN: [id number] DOB: 1929-06-27 Today's Date: 04/25/2016    History of Present Illness Valerie Carr is a 83 y.o. female with medical history significant of dementia, coronary artery disease status post CABG, atrial fibrillation , sick sinus syndrome status post pacemaker, hypertension, hyperlipidemia, type 2 diabetes mellitus, presents to the emergency room with chief complaint of abdominal pain, as well as nausea vomiting; , S/P exCT + possible SBO,S/P explaratory lap 3/25    PT Comments    Assisted OOB to BSc then amb an increased distance in hallway.  Pt progressing slowly and will need [facility] at SNF  Follow Up Recommendations  SNF     Equipment Recommendations       Recommendations for Other Services       Precautions / Restrictions Restrictions Weight Bearing Restrictions: No    Mobility  Bed Mobility Overal bed mobility: Needs Assistance Bed Mobility: Supine to Sit;Sit to Supine Rolling: Mod assist Sidelying to sit: Mod assist;HOB elevated Supine to sit: Mod assist;Min assist Sit to supine: Mod assist   General bed mobility comments: repeat functional VC's and increased time.  pt pleasant and following commands  Transfers Overall transfer level: Needs assistance Equipment used: None Transfers: Sit to/from [employer] Sit to Stand: Min assist;Mod assist         General transfer comment: 50% VC's on proper hand placement to avoid pulling up on walker and safety with turn completion.   assisted on/off BSC  Ambulation/Gait Ambulation/Gait assistance: Mod assist;+2 physical assistance;+2 safety/equipment Ambulation Distance (Feet): 28 Feet Assistive device: Rolling walker (2 wheeled) Gait Pattern/deviations: Step-to pattern;Step-through pattern;Trunk flexed;Drifts right/left Gait velocity: decreased   General Gait Details: + 2 assist for safety and guidance with walker.   Distance limited by ABD pain and fatigue.    Stairs            Wheelchair Mobility    Modified Rankin (Stroke Patients Only)       Balance                                            Cognition Arousal/Alertness: Awake/alert Behavior During Therapy: WFL for tasks assessed/performed Overall Cognitive Status: Within Functional Limits for tasks assessed                                 General Comments: Hx Alzheimers      Exercises      General Comments        Pertinent Vitals/Pain      Home Living                      Prior Function            PT Goals (current goals can now be found in the care plan section) Progress towards PT goals: Progressing toward goals    Frequency    Min 3X/week      PT Plan Current plan remains appropriate    Co-evaluation             End of Session Equipment Utilized During Treatment: Gait belt Activity Tolerance: Patient limited by pain;Patient limited by fatigue Patient left: in bed;with call bell/phone within reach;with bed alarm set;with family/visitor present Nurse Communication: Mobility status PT Visit  Diagnosis: Unsteadiness on feet (R26.81);Pain     Time: [id number] Calculation (min) (ACUTE ONLY): 24 min  Charges:  $Gait Training: 8-22 mins $Therapeutic Activity: 8-22 mins                    G Codes:       Felecia Shelling  PTA [facility]  Acute  Rehab Pager      [phone]

## 2016-04-25 NOTE — Clinical Social Work Placement (Signed)
   CLINICAL SOCIAL WORK PLACEMENT  NOTE  Date:  04/25/2016  Patient Details  Name: Valerie Carr MRN: [id number] Date of Birth: 03-09-1929  Clinical Social Work is seeking post-discharge placement for this patient at the Skilled  Nursing Facility level of care (*CSW will initial, date and re-position this form in  chart as items are completed):  Yes   Patient/family provided with [facility] Clinical Social Work Department's list of facilities offering this level of care within the geographic area requested by the patient (or if unable, by the patient's family).  Yes   Patient/family informed of their freedom to choose among providers that offer the needed level of care, that participate in Medicare, Medicaid or managed care program needed by the patient, have an available bed and are willing to accept the patient.  Yes   Patient/family informed of 's ownership interest in [facility] and [facility], as well as of the fact that they are under no obligation to receive care at these facilities.  PASRR submitted to EDS on 04/23/16     PASRR number received on 04/23/16     Existing PASRR number confirmed on       FL2 transmitted to all facilities in geographic area requested by pt/family on 04/23/16     FL2 transmitted to all facilities within larger geographic area on       Patient informed that his/her managed care company has contracts with or will negotiate with certain facilities, including the following:        Yes   Patient/family informed of bed offers received.  Patient chooses bed at [facility]     Physician recommends and patient chooses bed at      Patient to be transferred to [facility] on 04/25/16.  Patient to be transferred to facility by [facility]     Patient family notified on 04/25/16 of transfer.  Name of family member notified:  DAUGHTER     PHYSICIAN       Additional Comment: Pt / daughter are in  agreement with d/c to [facility] today. [facility] transport is required. Medical necessity form completed. Daughter is aware out of pocket costs may be associated with [facility] transport. D/C Summary sent to SNF for review. Scripts included in d/c packet. # for report provided to nsg.   _______________________________________________ Royetta Asal, LCSW  [phone] 04/25/2016, 3:27 PM

## 2016-04-25 NOTE — Discharge Summary (Signed)
Physician Discharge Summary  Valerie Carr WUJ:[id number] DOB: [birth year] DOA: 04/18/2016  PCP: Cala Bradford, MD  Admit date: 04/18/2016 Discharge date: 04/25/2016  Admitted From: Home Disposition:  SNF  Recommendations for Outpatient Follow-up:  1. Follow up with PCP in 1-2 weeks 2. Follow up with General Surgery Dr. Gerrit Friends as an outpatient 3. Please obtain BMP/CBC in one week 4. Outpatient Follow Up of Abdominal Aortic Aneurysm  5. Please follow up on the following pending results:  Home Health: No Equipment/Devices: None  Discharge Condition: STABLE CODE STATUS: DO NOT RESUSCITATE Diet recommendation: Heart Healthy - Clear Liquids Advance as Tolerated  Brief/Interim Summary: Valerie Gombos Suttonis a 83 y.o.femalewith medical history significant of dementia, coronary artery disease status post CABG, atrial fibrillation status post Maze procedure and sick sinus syndrome status post pacemaker, Hypertension, Hyperlipidemia, Type 2 diabetes Mellitus, and other comorbids who presented to the emergency room with chief complaint of abdominal pain, as well as nausea vomiting. In the ED, patient's vital signs are stable, she had blood work done which was fairly unremarkable except for a glucose of 216 and a potassium of 3.0, her creatinine is 1.1, she underwent a CT scan of the abdomen and pelvis which showed possible small bowel obstruction as well as a AAA. TR[facility] was asked to admit because of her multiple medical comorbidities and General surgery was consulted and she was found to have a small bowel obstruction with ischemic and infarcted bowel. She underwent an Exploratory Laparotomy with lysis of adhesions and small bowel resection on 04/20/16. She remained confused and has pulled out her NG tube x2. Steadily she improved but remained confused from her baseline dementia. She was found to be hypokalemic and hypokalemia has been improving with supplementation. Patient's diet was advanced by  General Surgery and she was tolerating clear liquids. Patient was deemed medically stable to be transferred to SNF and will need to follow up with General Surgery as an outpatient.   Discharge Diagnoses:  Active Problems:   Alzheimer's disease   Sick sinus syndrome (HCC)   Chronic atrial fibrillation (HCC)   Cardiac pacemaker in situ   Coronary artery disease involving coronary bypass graft of native heart without angina pectoris   HLD (hyperlipidemia)   Essential hypertension   Carotid artery stenosis, asymptomatic   SBO (small bowel obstruction)  Small bowel obstruction(h/o abdominal hysterectomy, appendectomy), ischemic and infarcted small bowel s/p Exploratory laparotomy. Lysis of adhesions. Small bowel resection (proximal ileum). On 3/25 by general surgery Dr Gerrit Friends POD 5 -She pulled out NG tube twice, she reported passing gas, less pain, no n/v,  -She was started on ice chips and sip of water on 3/27 per general surgery and diet advanced to Clears and now advanced to as tolerated -Continue IVF with NS at 75 mL -Will follow general surgery recommendations. -Pain Control with Acetaminophen and Tramadol -Will need to follow up with General Surgery within 1 week to be evaluated and have staples removed.   Cholelithiasis on CTAb/pel:  -lfts wnl -General Surgery Following  AKI on Chronic kidney disease stage III, improved -Patient's GFR has been in stage III range at least since 2015,  -Urinalysis does showed glucose, but no infection -Worsening of cr was likely from sbo, dehydration -Creatinine peaked at 1.5 on 3/25; Now improved to 0.80 -Renal dosing meds  Hypomagnesemia:  -Replaced Mag and now 1.8 -Repeat Mag Leve as an outpatient  Hypokalemia -Patient's Potassium was 3.4 -Replete with po KCl -Repeat CMP at SNF  Coronary artery disease  status post CABG -No chest pain, this appears to be stable,  -Troponin showed values of <0.03, <0.03, and 0.03 -Restart Home  Cardiac medications  Sick sinus syndrome/A. fib status post Maze procedure and now with pacemaker -Monitored on telemetry., her EKG shows paced rhythm. -Not on anticoagulation probably due to age/dementia, she is on full dose aspirin.  -Oral meds can be restarted now that diet being advanced to General Surgery - CHADSVASC score at least 2  Hypertension -Resume home medications as she is able to tolerate po - Was on IV Lopressor 2.5 mg IV q8h, and IV hydralazine 10 mg IV q6hprn for SBP >180  Noninsulin dependent Diabetes mellitus type II -Hba1c 7 -Resume Home metformin -Patient was on on Sensitive Novolog sliding TID in the hospital   Abdominal Aortic Aneurysm to max of 4.2 cm -This was present at least on a CT scan dated in 2010 -so doubt acute or new -Continue to Follow as an outpatient   Alzheimer's Dementia -She has underlying confusion, (not oriented to time or place today),  -Resumed Home medication -C/w Seroquel 25 mg po qHS  Severe Malnutrition: -Daughter report significant weight loss in the last few months 15 pounds -Nutrition consult appreciated  FTT/Frequent Falls:  -she has lived at a independent living for the past 9 years -will need SNF at discharge per PT recommendations  Acute Blood Loss Anemia from Surgery, stable -Hb/Hct now 9.9/27.6 -Partly dilutional drop -Repeat CBC at SNF  Hypophosphatemia -Patient's Phos Level was 1.3 -Replete with K Phos 20 mmol -Repeat Phos at SNF  Discharge Instructions  Discharge Instructions    Call MD for:  difficulty breathing, headache or visual disturbances    Complete by:  As directed    Call MD for:  extreme fatigue    Complete by:  As directed    Call MD for:  persistant dizziness or light-headedness    Complete by:  As directed    Call MD for:  persistant nausea and vomiting    Complete by:  As directed    Call MD for:  redness, tenderness, or signs of infection (pain, swelling, redness, odor or  green/yellow discharge around incision site)    Complete by:  As directed    Call MD for:  severe uncontrolled pain    Complete by:  As directed    Call MD for:  temperature >100.4    Complete by:  As directed    Diet - low sodium heart healthy    Complete by:  As directed    Discharge instructions    Complete by:  As directed    Follow up Care at SNF   Increase activity slowly    Complete by:  As directed      Allergies as of 04/25/2016      Reactions   Clarithromycin Other (See Comments)   unknown   Codeine Phosphate Other (See Comments)   unknown   Effexor [venlafaxine Hydrochloride] Other (See Comments)   unknown   Fexofenadine Hcl Other (See Comments)   unknown   Lisinopril Other (See Comments)   unknown   Prozac [fluoxetine Hcl] Other (See Comments)   unknown   Septra [bactrim] Other (See Comments)   unknown   Wellbutrin [bupropion Hcl] Other (See Comments)   unknown      Medication List    TAKE these medications   aspirin 325 MG tablet Take 325 mg by mouth daily.   donepezil 10 MG tablet Commonly known as:  ARICEPT  TAKE 1 TABLET (10 MG TOTAL) BY MOUTH AT BEDTIME.   DULoxetine 60 MG capsule Commonly known as:  CYMBALTA Take 60 mg by mouth daily.   memantine 10 MG tablet Commonly known as:  NAMENDA Take 1 tablet (10 mg total) by mouth 2 (two) times daily.   metFORMIN 500 MG 24 hr tablet Commonly known as:  GLUCOPHAGE-XR Take 500 mg by mouth daily with breakfast.   omeprazole 40 MG capsule Commonly known as:  PRILOSEC Take 40 mg by mouth daily.   ONE TOUCH ULTRA TEST test strip Generic drug:  glucose blood TEST DAILY DX E11.51   QUEtiapine 25 MG tablet Commonly known as:  SEROQUEL Take 1 tablet (25 mg total) by mouth every morning.   rosuvastatin 20 MG tablet Commonly known as:  CRESTOR Take 20 mg by mouth daily.   traMADol 50 MG tablet Commonly known as:  ULTRAM Take 1-2 tablets (50-100 mg total) by mouth every 6 (six) hours as needed for  moderate pain.   traZODone 100 MG tablet Commonly known as:  DESYREL Take 100 mg by mouth at bedtime.   valsartan 160 MG tablet Commonly known as:  DIOVAN Take 160 mg by mouth daily.       Allergies  Allergen Reactions  . Clarithromycin Other (See Comments)    unknown  . Codeine Phosphate Other (See Comments)    unknown  . Effexor [Venlafaxine Hydrochloride] Other (See Comments)    unknown  . Fexofenadine Hcl Other (See Comments)    unknown  . Lisinopril Other (See Comments)    unknown  . Prozac [Fluoxetine Hcl] Other (See Comments)    unknown  . Septra [Bactrim] Other (See Comments)    unknown  . Wellbutrin [Bupropion Hcl] Other (See Comments)    unknown   Consultations:  General Surgery  Procedures/Studies: Ct Abdomen Pelvis Wo Contrast  Result Date: 04/18/2016 CLINICAL DATA:  Generalized lower abdominal pain for 4 years, dementia, type II diabetes mellitus, hypertension, cholelithiasis, vascular disease, former smoker EXAM: CT ABDOMEN AND PELVIS WITHOUT CONTRAST TECHNIQUE: Multidetector CT imaging of the abdomen and pelvis was performed following the standard protocol without IV contrast. Sagittal and coronal MPR images reconstructed from axial data set. COMPARISON:  None FINDINGS: Lower chest: Basilar emphysematous changes with minimal atelectasis at lingula and lower lobes. Hepatobiliary: 2.0 cm calcified gallstone in gallbladder. Liver and gallbladder otherwise normal appearance Pancreas: Atrophic, otherwise unremarkable Spleen: Normal appearance. Adrenals/Urinary Tract: Thickening of adrenal glands without discrete mass. Tiny calcifications a kidneys which are likely a combination of vascular calcifications and a tiny upper pole RIGHT renal calculus. No mass or hydronephrosis. Bladder and ureters unremarkable Stomach/Bowel: Appendix not visualized, by history surgically absent. Small amount retained high attenuation material in RIGHT colon. Few sigmoid and descending  diverticula without evidence of diverticulitis. Questionable rectal wall thickening versus artifact from underdistention Stomach unremarkable. Dilated small bowel loops in pelvis with decompressed distal small bowel loops compatible with small bowel obstruction question due to adhesion in the LEFT pelvis. Vascular/Lymphatic: No adenopathy. Atherosclerotic calcifications of the aorta, iliac arteries, and coronary arteries. Fusiform aneurysmal dilatation of the aorta at the aortic hiatus 4.0 cm diameter. Focal saccular aneurysmal dilatation at the anterior proximal abdominal aorta at the level of the celiac artery, 4.2 x 3.2 cm image 25. Aneurysmal dilatation of the mid to distal abdominal aorta measuring 4.0 x 3.8 cm. Aneurysmal dilatation of common iliac artery 17 mm LEFT and 14 mm RIGHT. Reproductive: Uterus surgically absent.  Ovaries not visualized. Other:  No hernia or definite inflammatory process. No free air or free fluid. Musculoskeletal: Diffuse osseous demineralization. IMPRESSION: Aneurysmal dilatation of the abdominal aorta to 4.0 cm diameter at the aortic hiatus, 4.2 x 3.2 cm at the celiac artery, and a 4.0 x 3.8 cm at the mid to distal aorta. Aneurysmal dilatation of common iliac artery 17 mm LEFT and 14 mm RIGHT. Probable small bowel obstruction in the pelvis with dilated small bowel loops radiating from a point in the LEFT pelvis, question adhesion. Cannot exclude rectal wall thickening ; recommend proctoscopy to exclude rectal neoplasm. Cholelithiasis. Distal colonic diverticulosis. Electronically Signed   By: Ulyses Southward M.D.   On: 04/18/2016 11:58   Dg Abd 1 View  Result Date: 04/20/2016 CLINICAL DATA:  Evaluate NG tube. EXAM: ABDOMEN - 1 VIEW COMPARISON:  April 20, 2016 FINDINGS: The NG tube terminates in the left upper quadrant. Air-filled mildly prominent loops of bowel are identified, less prominent since earlier today. IMPRESSION: The NG tube is in good position. Air-filled prominent loops  of small bowel, improved since earlier today. Electronically Signed   By: Gerome Sam III M.D   On: 04/20/2016 19:36   Dg Abd 2 Views  Result Date: 04/20/2016 CLINICAL DATA:  Abdominal pain.  Recent small bowel obstruction. EXAM: ABDOMEN - 2 VIEW COMPARISON:  CT abdomen and pelvis April 18, 2016; abdominal radiographs April 19, 2016 FINDINGS: Supine and left lateral decubitus images were obtained. Multiple loops of dilated bowel remained. There are occasional air-fluid levels. No free air. Sizable gallstone noted in the gallbladder region. There is new calcification in the aorta with abdominal aortic distention as noted on recent CT. IMPRESSION: The bowel gas pattern is consistent with a degree of persistent bowel obstruction. No free air. Gallstone present. Aortic aneurysm, better delineated on recent CT. Electronically Signed   By: Bretta Bang III M.D.   On: 04/20/2016 10:09   Dg Abd 2 Views  Result Date: 04/19/2016 CLINICAL DATA:  Abdominal pain, small-bowel obstruction suspected on CT study from 1 day prior EXAM: ABDOMEN - 2 VIEW COMPARISON:  04/18/2016 CT abdomen/pelvis. FINDINGS: Persistent mildly dilated small bowel loops in the left pelvis measuring up to 3.2 cm diameter, not appreciably changed. Mild colonic stool volume. No evidence of pneumatosis or pneumoperitoneum. Aneurysmal atherosclerotic abdominal aorta. Cholelithiasis. IMPRESSION: 1. Persistent mildly dilated small bowel loops in the left pelvis, suggesting persistent partial mid to distal small bowel obstruction. 2. No evidence of pneumatosis or pneumoperitoneum. 3. Cholelithiasis. 4. Aneurysmal atherosclerotic abdominal aorta. Electronically Signed   By: Delbert Phenix M.D.   On: 04/19/2016 09:46     Subjective: Seen and examined and was still confused. No nausea or vomiting and states she has had some abdominal pain. No SOB. Discussed with Surgery and from their standpoint she is ok to go to SNF.  Discharge Exam: Vitals:    04/24/16 2228 04/25/16 0446  BP: (!) 138/55 (!) 157/59  Pulse: 77 78  Resp: 18 20  Temp: 98.4 F (36.9 C) 98.1 F (36.7 C)   Vitals:   04/24/16 0611 04/24/16 1432 04/24/16 2228 04/25/16 0446  BP: (!) 134/44 (!) 153/52 (!) 138/55 (!) 157/59  Pulse: 81 73 77 78  Resp: Temp: 99.3 F (37.4 C) 97.5 F (36.4 C) 98.4 F (36.9 C) 98.1 F (36.7 C)  TempSrc: Oral Oral Oral Oral  SpO2: 95% 99% 97% 93%  Weight:      Height:       General: Pt  is  awake, not in acute distress; Still confused Cardiovascular: RRR, S1/S2 +, no rubs, no gallops Respiratory: CTA bilaterally, no wheezing, no rhonchi Abdominal: Soft, NT, ND, bowel sounds +; Midline abdominal incision with staples was C/D/I.  Extremities: no edema, no cyanosis  The results of significant diagnostics from this hospitalization (including imaging, microbiology, ancillary and laboratory) are listed below for reference.    Microbiology: Recent Results (from the past 240 hour(s))  MRSA PCR Screening     Status: None   Collection Time: 04/18/16  3:17 PM  Result Value Ref Range Status   MRSA by PCR NEGATIVE NEGATIVE Final    Comment:        The GeneXpert MRSA Assay (FDA approved for NASAL specimens only), is one component of a comprehensive MRSA colonization surveillance program. It is not intended to diagnose MRSA infection nor to guide or monitor treatment for MRSA infections.    Labs: BNP (last 3 results) No results for input(s): BNP in the last 8760 hours. Basic Metabolic Panel:  Recent Labs Lab 04/20/16 0517 04/21/16 0350 04/22/16 0335 04/23/16 0547 04/24/16 0553 04/24/16 2246 04/25/16 0545  NA 136 134* 134* 137 135 134*  --   K 4.6 4.6 3.9 2.8* 3.2* 3.4*  --   CL 107 107 104 104 103 102  --   CO2 23 24 25 26 24 27   --   GLUCOSE 245* 234* 131* 99 166* 170*  --   BUN 36* 30* 24* 15 9 6   --   CREATININE 1.53* 1.05* 0.96 0.79 0.84 0.80  --   CALCIUM 8.7* 8.3* 8.2* 7.9* 7.7* 7.6*  --   MG 2.1   --  1.6* 1.9 1.6*  --  1.8  PHOS  --   --   --   --  1.3*  --  1.3*   Liver Function Tests:  Recent Labs Lab 04/19/16 0529 04/20/16 0517 04/24/16 0553 04/24/16 2246  AST 17 12* 18 22  ALT 10* 8* 16 18  ALKPHOS 40 39 41 42  BILITOT 0.7 0.5 1.0 0.8  PROT 6.8 6.3* 5.5* 6.1*  ALBUMIN 3.5 3.2* 2.5* 2.7*   No results for input(s): LIPASE, AMYLASE in the last 168 hours. No results for input(s): AMMONIA in the last 168 hours. CBC:  Recent Labs Lab 04/20/16 0517 04/21/16 0350 04/22/16 0335 04/23/16 0547 04/24/16 0553 04/25/16 0545  WBC 14.9* 11.5* 9.8 6.0 5.8 5.8  NEUTROABS 11.5*  --   --   --   --  3.7  HGB 13.2 11.1* 10.0* 9.2* 9.1* 9.9*  HCT 39.1 32.4* 29.3* 26.3* 25.4* 27.6*  MCV 87.1 87.3 86.9 85.1 84.4 83.9  PLT 228 183 194 186 227 261   Cardiac Enzymes:  Recent Labs Lab 04/23/16 1001 04/23/16 1542 04/23/16 2221  TROPONINI <0.03 <0.03 0.03*   BNP: Invalid input(s): POCBNP CBG:  Recent Labs Lab 04/24/16 1206 04/24/16 1653 04/24/16 2232 04/25/16 0730 04/25/16 1238  GLUCAP 163* 142* 140* 202* 188*   D-Dimer No results for input(s): DDIMER in the last 72 hours. Hgb A1c No results for input(s): HGBA1C in the last 72 hours. Lipid Profile No results for input(s): CHOL, HDL, LDLCALC, TRIG, CHOLHDL, LDLDIRECT in the last 72 hours. Thyroid function studies No results for input(s): TSH, T4TOTAL, T3FREE, THYROIDAB in the last 72 hours.  Invalid input(s): FREET3 Anemia work up No results for input(s): VITAMINB12, FOLATE, FERRITIN, TIBC, IRON, RETICCTPCT in the last 72 hours. Urinalysis    Component Value Date/Time  COLORURINE YELLOW 04/18/2016 1105   APPEARANCEUR CLEAR 04/18/2016 1105   LABSPEC 1.016 04/18/2016 1105   PHURINE 6.0 04/18/2016 1105   GLUCOSEU >=500 (A) 04/18/2016 1105   HGBUR SMALL (A) 04/18/2016 1105   BILIRUBINUR NEGATIVE 04/18/2016 1105   KETONESUR 5 (A) 04/18/2016 1105   PROTEINUR NEGATIVE 04/18/2016 1105   UROBILINOGEN 1.0  05/26/2013 1648   NITRITE NEGATIVE 04/18/2016 1105   LEUKOCYTESUR NEGATIVE 04/18/2016 1105   Sepsis Labs Invalid input(s): PROCALCITONIN,  WBC,  LACTICIDVEN Microbiology Recent Results (from the past 240 hour(s))  MRSA PCR Screening     Status: None   Collection Time: 04/18/16  3:17 PM  Result Value Ref Range Status   MRSA by PCR NEGATIVE NEGATIVE Final    Comment:        The GeneXpert MRSA Assay (FDA approved for NASAL specimens only), is one component of a comprehensive MRSA colonization surveillance program. It is not intended to diagnose MRSA infection nor to guide or monitor treatment for MRSA infections.    Time coordinating discharge: 40 minutes  SIGNED:  Merlene Laughter, DO [facility] 04/25/2016, 1:42 PM Pager [phone]  If 7PM-7AM, please contact night-coverage [email or web address] Password [email or web address]

## 2016-04-28 DIAGNOSIS — I10 Essential (primary) hypertension: Secondary | ICD-10-CM | POA: Diagnosis not present

## 2016-04-28 DIAGNOSIS — R109 Unspecified abdominal pain: Secondary | ICD-10-CM | POA: Diagnosis not present

## 2016-04-28 DIAGNOSIS — I4891 Unspecified atrial fibrillation: Secondary | ICD-10-CM | POA: Diagnosis not present

## 2016-04-28 DIAGNOSIS — E119 Type 2 diabetes mellitus without complications: Secondary | ICD-10-CM | POA: Diagnosis not present

## 2016-04-28 DIAGNOSIS — G309 Alzheimer's disease, unspecified: Secondary | ICD-10-CM | POA: Diagnosis not present

## 2016-04-28 DIAGNOSIS — K56609 Unspecified intestinal obstruction, unspecified as to partial versus complete obstruction: Secondary | ICD-10-CM | POA: Diagnosis not present

## 2016-04-28 DIAGNOSIS — N179 Acute kidney failure, unspecified: Secondary | ICD-10-CM | POA: Diagnosis not present

## 2016-05-06 ENCOUNTER — Other Ambulatory Visit: Payer: Self-pay | Admitting: *Deleted

## 2016-05-06 DIAGNOSIS — K56609 Unspecified intestinal obstruction, unspecified as to partial versus complete obstruction: Secondary | ICD-10-CM | POA: Diagnosis not present

## 2016-05-06 DIAGNOSIS — E119 Type 2 diabetes mellitus without complications: Secondary | ICD-10-CM | POA: Diagnosis not present

## 2016-05-06 DIAGNOSIS — I10 Essential (primary) hypertension: Secondary | ICD-10-CM | POA: Diagnosis not present

## 2016-05-06 DIAGNOSIS — I2510 Atherosclerotic heart disease of native coronary artery without angina pectoris: Secondary | ICD-10-CM | POA: Diagnosis not present

## 2016-05-06 NOTE — Patient Outreach (Signed)
[facility]) Care Management  05/06/2016  Valerie Carr [birth year] [id number]   Met with Valerie Carr, SW at facility. She reports patient will discharge around 4/18 with home care. Patient lives in independent living apartment and will return home upon discharge, she lives alone with family and friends support.  SW will set up home care and RW.  Patient has history of mild dementia, Afib, pacemaker and AAA repair.  Met with patient at bedside, she had a friend visiting. Reviewed [facility] care management program and gave brochure for her daughter to review.   Plan to monitor for [facility] discharge planning needs.  Valerie Crochet. Laymond Purser, RN, BSN, [place] [phone]) Business Cell  [phone]) Toll Free Office

## 2016-05-12 ENCOUNTER — Ambulatory Visit: Payer: Medicare Other | Admitting: Neurology

## 2016-05-20 ENCOUNTER — Other Ambulatory Visit: Payer: Self-pay

## 2016-05-20 MED ORDER — DONEPEZIL HCL 10 MG PO TABS: ORAL_TABLET | ORAL | 3 refills | Status: DC

## 2016-06-27 NOTE — Addendum Note (Signed)
Addendum  created 06/27/16 1125 by Siona Coulston D, MD   Sign clinical note    

## 2016-08-05 ENCOUNTER — Ambulatory Visit (INDEPENDENT_AMBULATORY_CARE_PROVIDER_SITE_OTHER): Payer: Medicare Other | Admitting: Neurology

## 2016-08-05 ENCOUNTER — Encounter: Payer: Self-pay | Admitting: Neurology

## 2016-08-05 VITALS — BP 143/65 | HR 70 | Wt 129.0 lb

## 2016-08-05 DIAGNOSIS — G301 Alzheimer's disease with late onset: Secondary | ICD-10-CM

## 2016-08-05 DIAGNOSIS — F02818 Alzheimer's disease with late onset: Secondary | ICD-10-CM

## 2016-08-05 DIAGNOSIS — I482 Chronic atrial fibrillation, unspecified: Secondary | ICD-10-CM

## 2016-08-05 DIAGNOSIS — I6523 Occlusion and stenosis of bilateral carotid arteries: Secondary | ICD-10-CM

## 2016-08-05 DIAGNOSIS — Z950 Presence of cardiac pacemaker: Secondary | ICD-10-CM | POA: Diagnosis not present

## 2016-08-05 DIAGNOSIS — F0281 Dementia in other diseases classified elsewhere with behavioral disturbance: Secondary | ICD-10-CM

## 2016-08-05 DIAGNOSIS — I10 Essential (primary) hypertension: Secondary | ICD-10-CM

## 2016-08-05 DIAGNOSIS — E785 Hyperlipidemia, unspecified: Secondary | ICD-10-CM | POA: Diagnosis not present

## 2016-08-05 NOTE — Patient Instructions (Signed)
-   continue ASA for stroke prevention. - continue aricept and namenda. - follow up with PCP and cardiology. - continue seroquel daily. - will refer to neuropsych testing for evaluation - check BP and glucose at home and record - follow up in 4 months.
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
## 2016-08-06 ENCOUNTER — Encounter: Payer: Self-pay | Admitting: Psychology

## 2016-08-26 ENCOUNTER — Telehealth: Payer: Self-pay | Admitting: Neurology

## 2016-08-26 DIAGNOSIS — G301 Alzheimer's disease with late onset: Principal | ICD-10-CM

## 2016-08-26 DIAGNOSIS — F02818 Dementia in other diseases classified elsewhere, unspecified severity, with other behavioral disturbance: Secondary | ICD-10-CM

## 2016-08-26 DIAGNOSIS — F0281 Dementia in other diseases classified elsewhere with behavioral disturbance: Secondary | ICD-10-CM

## 2016-08-26 NOTE — Telephone Encounter (Signed)
Dr.Xu can you do referral for nursing, and social worker for patient thanks.

## 2016-08-26 NOTE — Telephone Encounter (Addendum)
Pt's daughter called the office said the pt's behavioral issues are worse. Daughter said the pt is giving her food away, not bathing, forgetting she has cooked food, not taking her medication and is very demanding, aggressive and combative. Daughter said she needs help, she is the only caregiver, she became very teary eyed. Said the pt needs to be where she can have 24 hour care. She is wanting to go to the beach for some relief but doesn't have anyone take care of her mother-family will not help, they are scared of her. She is very teary-eyed, said she can't do it anymore. Please call

## 2016-08-27 NOTE — Addendum Note (Signed)
Addended by: Marvel PlanXU, Ace Bergfeld on: 08/27/2016 06:17 PM   Modules accepted: Orders

## 2016-08-27 NOTE — Telephone Encounter (Signed)
I have made the referral. Let's see if it works out. Thanks.  Valerie PlanJindong Tosha Belgarde, MD PhD Stroke Neurology 08/27/2016 6:16 PM  Orders Placed This Encounter  Procedures  . Ambulatory referral to Home Health    Referral Priority:   Urgent    Referral Type:   Home Health Care    Referral Reason:   Specialty Services Required    Requested Specialty:   Home Health Services    Number of Visits Requested:   1

## 2016-09-02 NOTE — Telephone Encounter (Signed)
Noted. Thanks.   Marvel PlanJindong Julann Mcgilvray, MD PhD Stroke Neurology 09/02/2016 5:36 PM

## 2016-09-02 NOTE — Telephone Encounter (Signed)
Marchelle Folksmanda T/[place] [phone] called the office pt's daughter requested home health be deferred until Thursday due to pt having pink eye. FYI

## 2016-09-03 ENCOUNTER — Emergency Department (HOSPITAL_COMMUNITY): Payer: Medicare Other

## 2016-09-03 ENCOUNTER — Encounter (HOSPITAL_COMMUNITY): Payer: Self-pay

## 2016-09-03 ENCOUNTER — Inpatient Hospital Stay (HOSPITAL_COMMUNITY)
Admission: EM | Admit: 2016-09-03 | Discharge: 2016-09-05 | DRG: 683 | Disposition: A | Discharge status: Home / Self Care | Payer: Medicare Other | Attending: Internal Medicine | Admitting: Internal Medicine

## 2016-09-03 ENCOUNTER — Telehealth: Payer: Self-pay | Admitting: Neurology

## 2016-09-03 DIAGNOSIS — Z87891 Personal history of nicotine dependence: Secondary | ICD-10-CM

## 2016-09-03 DIAGNOSIS — N179 Acute kidney failure, unspecified: Principal | ICD-10-CM

## 2016-09-03 DIAGNOSIS — F03918 Unspecified dementia, unspecified severity, with other behavioral disturbance: Secondary | ICD-10-CM

## 2016-09-03 DIAGNOSIS — R079 Chest pain, unspecified: Secondary | ICD-10-CM | POA: Diagnosis not present

## 2016-09-03 DIAGNOSIS — G309 Alzheimer's disease, unspecified: Secondary | ICD-10-CM

## 2016-09-03 DIAGNOSIS — Z888 Allergy status to other drugs, medicaments and biological substances status: Secondary | ICD-10-CM

## 2016-09-03 DIAGNOSIS — F419 Anxiety disorder, unspecified: Secondary | ICD-10-CM | POA: Diagnosis not present

## 2016-09-03 DIAGNOSIS — B9689 Other specified bacterial agents as the cause of diseases classified elsewhere: Secondary | ICD-10-CM | POA: Diagnosis not present

## 2016-09-03 DIAGNOSIS — E860 Dehydration: Secondary | ICD-10-CM | POA: Diagnosis present

## 2016-09-03 DIAGNOSIS — Z881 Allergy status to other antibiotic agents status: Secondary | ICD-10-CM

## 2016-09-03 DIAGNOSIS — N3000 Acute cystitis without hematuria: Secondary | ICD-10-CM

## 2016-09-03 DIAGNOSIS — E1151 Type 2 diabetes mellitus with diabetic peripheral angiopathy without gangrene: Secondary | ICD-10-CM | POA: Diagnosis present

## 2016-09-03 DIAGNOSIS — Z833 Family history of diabetes mellitus: Secondary | ICD-10-CM

## 2016-09-03 DIAGNOSIS — F0281 Dementia in other diseases classified elsewhere with behavioral disturbance: Secondary | ICD-10-CM | POA: Diagnosis not present

## 2016-09-03 DIAGNOSIS — Z66 Do not resuscitate: Secondary | ICD-10-CM | POA: Diagnosis present

## 2016-09-03 DIAGNOSIS — M25512 Pain in left shoulder: Secondary | ICD-10-CM | POA: Diagnosis not present

## 2016-09-03 DIAGNOSIS — E782 Mixed hyperlipidemia: Secondary | ICD-10-CM | POA: Diagnosis present

## 2016-09-03 DIAGNOSIS — F329 Major depressive disorder, single episode, unspecified: Secondary | ICD-10-CM | POA: Diagnosis present

## 2016-09-03 DIAGNOSIS — F0280 Dementia in other diseases classified elsewhere without behavioral disturbance: Secondary | ICD-10-CM | POA: Diagnosis present

## 2016-09-03 DIAGNOSIS — E785 Hyperlipidemia, unspecified: Secondary | ICD-10-CM | POA: Diagnosis present

## 2016-09-03 DIAGNOSIS — S0990XA Unspecified injury of head, initial encounter: Secondary | ICD-10-CM | POA: Diagnosis not present

## 2016-09-03 DIAGNOSIS — I10 Essential (primary) hypertension: Secondary | ICD-10-CM | POA: Diagnosis present

## 2016-09-03 DIAGNOSIS — F0391 Unspecified dementia with behavioral disturbance: Secondary | ICD-10-CM

## 2016-09-03 DIAGNOSIS — Z808 Family history of malignant neoplasm of other organs or systems: Secondary | ICD-10-CM

## 2016-09-03 DIAGNOSIS — Z7984 Long term (current) use of oral hypoglycemic drugs: Secondary | ICD-10-CM

## 2016-09-03 DIAGNOSIS — N390 Urinary tract infection, site not specified: Secondary | ICD-10-CM | POA: Diagnosis present

## 2016-09-03 DIAGNOSIS — Z8249 Family history of ischemic heart disease and other diseases of the circulatory system: Secondary | ICD-10-CM

## 2016-09-03 DIAGNOSIS — E119 Type 2 diabetes mellitus without complications: Secondary | ICD-10-CM

## 2016-09-03 DIAGNOSIS — I2510 Atherosclerotic heart disease of native coronary artery without angina pectoris: Secondary | ICD-10-CM | POA: Diagnosis present

## 2016-09-03 DIAGNOSIS — Z882 Allergy status to sulfonamides status: Secondary | ICD-10-CM

## 2016-09-03 DIAGNOSIS — Z951 Presence of aortocoronary bypass graft: Secondary | ICD-10-CM

## 2016-09-03 DIAGNOSIS — Z7982 Long term (current) use of aspirin: Secondary | ICD-10-CM

## 2016-09-03 DIAGNOSIS — Z950 Presence of cardiac pacemaker: Secondary | ICD-10-CM

## 2016-09-03 DIAGNOSIS — T148XXA Other injury of unspecified body region, initial encounter: Secondary | ICD-10-CM | POA: Diagnosis not present

## 2016-09-03 DIAGNOSIS — Z885 Allergy status to narcotic agent status: Secondary | ICD-10-CM

## 2016-09-03 DIAGNOSIS — Z90710 Acquired absence of both cervix and uterus: Secondary | ICD-10-CM

## 2016-09-03 LAB — CBC WITH DIFFERENTIAL/PLATELET
BASOS ABS: 0.1 10*3/uL (ref 0.0–0.1)
BASOS PCT: 1 %
Eosinophils Absolute: 0.2 10*3/uL (ref 0.0–0.7)
Eosinophils Relative: 3 %
HEMATOCRIT: 31.2 % — AB (ref 36.0–46.0)
HEMOGLOBIN: 10.3 g/dL — AB (ref 12.0–15.0)
Lymphocytes Relative: 27 %
Lymphs Abs: 1.5 10*3/uL (ref 0.7–4.0)
MCH: 26.2 pg (ref 26.0–34.0)
MCHC: 33 g/dL (ref 30.0–36.0)
MCV: 79.4 fL (ref 78.0–100.0)
MONOS PCT: 10 %
Monocytes Absolute: 0.6 10*3/uL (ref 0.1–1.0)
NEUTROS ABS: 3.2 10*3/uL (ref 1.7–7.7)
NEUTROS PCT: 59 %
Platelets: 211 10*3/uL (ref 150–400)
RBC: 3.93 MIL/uL (ref 3.87–5.11)
RDW: 14.6 % (ref 11.5–15.5)
WBC: 5.6 10*3/uL (ref 4.0–10.5)

## 2016-09-03 LAB — URINALYSIS, ROUTINE W REFLEX MICROSCOPIC
BILIRUBIN URINE: NEGATIVE
Glucose, UA: NEGATIVE mg/dL
Hgb urine dipstick: NEGATIVE
KETONES UR: NEGATIVE mg/dL
Nitrite: NEGATIVE
PROTEIN: NEGATIVE mg/dL
Specific Gravity, Urine: 1.02 (ref 1.005–1.030)
pH: 5 (ref 5.0–8.0)

## 2016-09-03 LAB — COMPREHENSIVE METABOLIC PANEL
ALBUMIN: 3.8 g/dL (ref 3.5–5.0)
ALK PHOS: 46 U/L (ref 38–126)
ALT: 13 U/L — AB (ref 14–54)
AST: 21 U/L (ref 15–41)
Anion gap: 6 (ref 5–15)
BILIRUBIN TOTAL: 0.5 mg/dL (ref 0.3–1.2)
BUN: 34 mg/dL — AB (ref 6–20)
CALCIUM: 8.9 mg/dL (ref 8.9–10.3)
CO2: 25 mmol/L (ref 22–32)
CREATININE: 1.28 mg/dL — AB (ref 0.44–1.00)
Chloride: 106 mmol/L (ref 101–111)
GFR calc Af Amer: 43 mL/min — ABNORMAL LOW (ref >60)
GFR calc non Af Amer: 37 mL/min — ABNORMAL LOW (ref >60)
GLUCOSE: 137 mg/dL — AB (ref 65–99)
Potassium: 4.3 mmol/L (ref 3.5–5.1)
Sodium: 137 mmol/L (ref 135–145)
TOTAL PROTEIN: 7.2 g/dL (ref 6.5–8.1)

## 2016-09-03 LAB — I-STAT TROPONIN, ED
Troponin i, poc: 0 ng/mL (ref 0.00–0.08)
Troponin i, poc: 0 ng/mL (ref 0.00–0.08)

## 2016-09-03 MED ORDER — SODIUM CHLORIDE 0.9 % IV SOLN
INTRAVENOUS | Status: DC
  Administered 2016-09-04 – 2016-09-05 (×3): via INTRAVENOUS

## 2016-09-03 MED ORDER — TRAZODONE HCL 100 MG PO TABS
100.0000 mg | ORAL_TABLET | Freq: Every day | ORAL | Status: DC
  Administered 2016-09-04 (×2): 100 mg via ORAL
  Filled 2016-09-03 (×2): qty 1

## 2016-09-03 MED ORDER — ZIPRASIDONE MESYLATE 20 MG IM SOLR
10.0000 mg | Freq: Once | INTRAMUSCULAR | Status: AC
  Administered 2016-09-03: 10 mg via INTRAMUSCULAR
  Filled 2016-09-03: qty 20

## 2016-09-03 MED ORDER — STERILE WATER FOR INJECTION IJ SOLN
INTRAMUSCULAR | Status: AC
  Administered 2016-09-03: 1.2 mL
  Filled 2016-09-03: qty 10

## 2016-09-03 MED ORDER — ONDANSETRON HCL 4 MG PO TABS: 4.0000 mg | ORAL_TABLET | Freq: Four times a day (QID) | ORAL | Status: DC | PRN

## 2016-09-03 MED ORDER — SODIUM CHLORIDE 0.9 % IV BOLUS (SEPSIS)
500.0000 mL | Freq: Once | INTRAVENOUS | Status: AC
Start: 2016-09-03 — End: 2016-09-03
  Administered 2016-09-03: 500 mL via INTRAVENOUS

## 2016-09-03 MED ORDER — INSULIN ASPART 100 UNIT/ML ~~LOC~~ SOLN: 0.0000 [IU] | Freq: Every day | SUBCUTANEOUS | Status: DC

## 2016-09-03 MED ORDER — ENOXAPARIN SODIUM 30 MG/0.3ML ~~LOC~~ SOLN: 30.0000 mg | Freq: Every day | SUBCUTANEOUS | Status: DC

## 2016-09-03 MED ORDER — DONEPEZIL HCL 5 MG PO TABS
10.0000 mg | ORAL_TABLET | Freq: Every day | ORAL | Status: DC
  Administered 2016-09-04 (×2): 10 mg via ORAL
  Filled 2016-09-03 (×2): qty 2

## 2016-09-03 MED ORDER — MEMANTINE HCL 10 MG PO TABS
10.0000 mg | ORAL_TABLET | Freq: Two times a day (BID) | ORAL | Status: DC
  Administered 2016-09-04 – 2016-09-05 (×4): 10 mg via ORAL
  Filled 2016-09-03 (×4): qty 1

## 2016-09-03 MED ORDER — ONDANSETRON HCL 4 MG/2ML IJ SOLN: 4.0000 mg | Freq: Four times a day (QID) | INTRAMUSCULAR | Status: DC | PRN

## 2016-09-03 MED ORDER — ACETAMINOPHEN 325 MG PO TABS
650.0000 mg | ORAL_TABLET | Freq: Four times a day (QID) | ORAL | Status: DC | PRN
  Administered 2016-09-04 – 2016-09-05 (×2): 650 mg via ORAL
  Filled 2016-09-03 (×2): qty 2

## 2016-09-03 MED ORDER — QUETIAPINE FUMARATE 25 MG PO TABS
25.0000 mg | ORAL_TABLET | Freq: Every morning | ORAL | Status: DC
  Administered 2016-09-04 – 2016-09-05 (×2): 25 mg via ORAL
  Filled 2016-09-03 (×2): qty 1

## 2016-09-03 MED ORDER — SODIUM CHLORIDE 0.9 % IV BOLUS (SEPSIS)
1000.0000 mL | Freq: Once | INTRAVENOUS | Status: AC
Start: 2016-09-03 — End: 2016-09-03
  Administered 2016-09-03: 1000 mL via INTRAVENOUS

## 2016-09-03 MED ORDER — ROSUVASTATIN CALCIUM 20 MG PO TABS
20.0000 mg | ORAL_TABLET | Freq: Every day | ORAL | Status: DC
  Administered 2016-09-04: 20 mg via ORAL
  Filled 2016-09-03: qty 1

## 2016-09-03 MED ORDER — ACETAMINOPHEN 650 MG RE SUPP: 650.0000 mg | Freq: Four times a day (QID) | RECTAL | Status: DC | PRN

## 2016-09-03 MED ORDER — DOCUSATE SODIUM 100 MG PO CAPS
100.0000 mg | ORAL_CAPSULE | Freq: Two times a day (BID) | ORAL | Status: DC
  Administered 2016-09-04 – 2016-09-05 (×4): 100 mg via ORAL
  Filled 2016-09-03 (×4): qty 1

## 2016-09-03 MED ORDER — DEXTROSE 5 % IV SOLN
1.0000 g | Freq: Once | INTRAVENOUS | Status: AC
  Administered 2016-09-03: 1 g via INTRAVENOUS
  Filled 2016-09-03: qty 10

## 2016-09-03 MED ORDER — PANTOPRAZOLE SODIUM 40 MG PO TBEC
40.0000 mg | DELAYED_RELEASE_TABLET | Freq: Every day | ORAL | Status: DC
  Administered 2016-09-04 – 2016-09-05 (×2): 40 mg via ORAL
  Filled 2016-09-03 (×2): qty 1

## 2016-09-03 MED ORDER — INSULIN ASPART 100 UNIT/ML ~~LOC~~ SOLN
0.0000 [IU] | Freq: Three times a day (TID) | SUBCUTANEOUS | Status: DC
  Administered 2016-09-04: 1 [IU] via SUBCUTANEOUS
  Administered 2016-09-04: 2 [IU] via SUBCUTANEOUS
  Administered 2016-09-04 – 2016-09-05 (×3): 1 [IU] via SUBCUTANEOUS

## 2016-09-03 MED ORDER — DULOXETINE HCL 30 MG PO CPEP
60.0000 mg | ORAL_CAPSULE | Freq: Every day | ORAL | Status: DC
Start: 2016-09-04 — End: 2016-09-05
  Administered 2016-09-04 – 2016-09-05 (×2): 60 mg via ORAL
  Filled 2016-09-03 (×2): qty 2

## 2016-09-03 MED ORDER — SODIUM CHLORIDE 0.9 % IV BOLUS (SEPSIS)
500.0000 mL | Freq: Once | INTRAVENOUS | Status: AC
  Administered 2016-09-03: 500 mL via INTRAVENOUS

## 2016-09-03 NOTE — ED Provider Notes (Signed)
[facility]-EMERGENCY DEPT Provider Note   CSN: [id number] Arrival date & time: 09/03/16  1712     History   Chief Complaint Chief Complaint  Patient presents with  . Dizziness    HPI Valerie Carr is a 83 y.o. female.  HPI Pt has a history of dementia.  She lives in an independent living facility.  Pt has been having falls recently.  Pt states she fell and passed out.  Family is not really sure about that.  Pt has not been taking her medications properly.  Family found a stockpile of her medications.  Pt does not seem to be walking properly and her balance is off.  She is supposed to be using a walker.  Family is constantly having to bring her food because she says she is running out.  Family feels like they cannot care for her. Past Medical History:  Diagnosis Date  . Arteriosclerotic cardiovascular disease (ASCVD)   . Chronic anxiety    and depression  . Dementia   . DM type 2 (diabetes mellitus, type 2) (HCC)   . Gallstones    asysmptomatic  . HTN (hypertension)   . Hyperlipidemia   . Memory loss   . Sick sinus syndrome [facility])    s/p PPM    Patient Active Problem List   Diagnosis Date Noted  . SBO (small bowel obstruction) (HCC) 04/18/2016  . Chronic atrial fibrillation (HCC) 05/31/2014  . Cardiac pacemaker in situ 05/31/2014  . Coronary artery disease involving coronary bypass graft of native heart without angina pectoris 05/31/2014  . Hyperlipidemia 05/31/2014  . Essential hypertension 05/31/2014  . Carotid artery stenosis, asymptomatic 05/31/2014  . Sick sinus syndrome (HCC) 04/18/2013  . Coronary atherosclerosis of native coronary artery 01/11/2013  . Essential hypertension, benign 01/11/2013  . Mixed hyperlipidemia 01/11/2013  . Alzheimer's disease 09/02/2012  . Memory loss 09/02/2012  . Bradycardia 01/06/2011    Past Surgical History:  Procedure Laterality Date  . APPENDECTOMY    . CORONARY ARTERY BYPASS GRAFT     07-11-2005, triple bypass  .  ESOPHAGOGASTRODUODENOSCOPY    . LAPAROTOMY N/A 04/20/2016   Procedure: EXPLORATORY LAPAROTOMY, SMALL BOWEL RESECTION, LYSIS OF ADHESIONS;  Surgeon: Darnell Level, MD;  Location: [facility] ORS;  Service: General;  Laterality: N/A;  . PACEMAKER INSERTION  2004   [employer] PPM implanted by Dr Amil Amen, generator change by Dr Johney Frame 01/07/11  . PERMANENT PACEMAKER GENERATOR CHANGE N/A 01/07/2011   Procedure: PERMANENT PACEMAKER GENERATOR CHANGE;  Surgeon: Hillis Range, MD;  Location: [facility] CATH LAB;  Service: Cardiovascular;  Laterality: N/A;  . TONSILLECTOMY    . TOTAL ABDOMINAL HYSTERECTOMY W/ BILATERAL SALPINGOOPHORECTOMY      OB History    No data available       Home Medications    Prior to Admission medications   Medication Sig Start Date End Date Taking? Authorizing Provider  aspirin 325 MG tablet Take 325 mg by mouth daily.    [provider]  donepezil (ARICEPT) 10 MG tablet TAKE 1 TABLET (10 MG TOTAL) BY MOUTH AT BEDTIME. 05/20/16   Marvel Plan, MD  DULoxetine (CYMBALTA) 60 MG capsule Take 60 mg by mouth daily.     [provider]  memantine (NAMENDA) 10 MG tablet Take 1 tablet (10 mg total) by mouth 2 (two) times daily. 01/11/13   Ronal Fear, NP  metFORMIN (GLUCOPHAGE-XR) 500 MG 24 hr tablet Take 500 mg by mouth daily with breakfast.      [provider]  omeprazole (PRILOSEC) 40 MG capsule Take 40 mg by mouth daily. 02/28/15   [provider]  ONE TOUCH ULTRA TEST test strip TEST DAILY DX E11.51 06/19/14   [provider]  QUEtiapine (SEROQUEL) 25 MG tablet Take 1 tablet (25 mg total) by mouth every morning. 11/08/15   Marvel Plan, MD  rosuvastatin (CRESTOR) 20 MG tablet Take 20 mg by mouth daily. 03/21/16   [provider]  traMADol (ULTRAM) 50 MG tablet Take 1-2 tablets (50-100 mg total) by mouth every 6 (six) hours as needed for moderate pain. 04/25/16   Marguerita Merles Latif, DO  traZODone (DESYREL) 100 MG tablet Take 100 mg by mouth  at bedtime.      [provider]  valsartan (DIOVAN) 160 MG tablet Take 160 mg by mouth daily.    [provider]    Family History Family History  Problem Relation Age of Onset  . Diabetes Mother   . Hypertension Mother   . Coronary artery disease Father   . Diabetes Brother   . Throat cancer Brother   . Diabetes Brother   . Hypertension Sister   . Cancer Son        esophageal    Social History Social History  Substance Use Topics  . Smoking status: Former Smoker    Packs/day: 1.00    Years: 25.00  . Smokeless tobacco: Former Neurosurgeon     Comment: quit in 2001  . Alcohol use No     Allergies   Clarithromycin; Codeine phosphate; Effexor [venlafaxine hydrochloride]; Fexofenadine hcl; Lisinopril; Prozac [fluoxetine hcl]; Septra [bactrim]; and Wellbutrin [bupropion hcl]   Review of Systems Review of Systems  All other systems reviewed and are negative.    Physical Exam Updated Vital Signs BP (!) 105/39   Pulse 81   Temp 98.5 F (36.9 C) (Oral)   Resp (!) 24   Ht 1.702 m (5\' 7" )   Wt 54.4 kg (120 lb)   SpO2 96%   BMI 18.79 kg/m   Physical Exam  Constitutional: No distress.  HENT:  Head: Normocephalic and atraumatic.  Right Ear: External ear normal.  Left Ear: External ear normal.  Mouth/Throat: No oropharyngeal exudate.  Eyes: Conjunctivae are normal. Right eye exhibits no discharge. Left eye exhibits no discharge. No scleral icterus.  Neck: Neck supple. No tracheal deviation present.  Cardiovascular: Normal rate, regular rhythm and intact distal pulses.   Pulmonary/Chest: Effort normal and breath sounds normal. No stridor. No respiratory distress. She has no wheezes. She has no rales.  Abdominal: Soft. Bowel sounds are normal. She exhibits no distension. There is no tenderness. There is no rebound and no guarding.  Musculoskeletal: She exhibits no edema or tenderness.  Neurological: She is alert. She has normal strength. No cranial nerve  deficit (no facial droop, extraocular movements intact, no slurred speech) or sensory deficit. She exhibits normal muscle tone. She displays no seizure activity. Coordination normal.  Skin: Skin is warm and dry. No rash noted. She is not diaphoretic.  Psychiatric: She has a normal mood and affect.  Nursing note and vitals reviewed.    ED Treatments / Results  Labs (all labs ordered are listed, but only abnormal results are displayed) Labs Reviewed  CBC WITH DIFFERENTIAL/PLATELET - Abnormal; Notable for the following:       Result Value   Hemoglobin 10.3 (*)    HCT 31.2 (*)    All other components within normal limits  COMPREHENSIVE METABOLIC PANEL - Abnormal;  Notable for the following:    Glucose, Bld 137 (*)    BUN 34 (*)    Creatinine, Ser 1.28 (*)    ALT 13 (*)    GFR calc non Af Amer 37 (*)    GFR calc Af Amer 43 (*)    All other components within normal limits  URINALYSIS, ROUTINE W REFLEX MICROSCOPIC - Abnormal; Notable for the following:    APPearance HAZY (*)    Leukocytes, UA MODERATE (*)    Bacteria, UA MANY (*)    Squamous Epithelial / LPF 6-30 (*)    All other components within normal limits  I-STAT TROPONIN, ED  I-STAT TROPONIN, ED    EKG  EKG Interpretation  Date/Time:  Wednesday September 03 2016 18:45:26 EDT Ventricular Rate:  70 PR Interval:    QRS Duration: 110 QT Interval:  416 QTC Calculation: 449 R Axis:   13 Text Interpretation:  atrial pacing vs first degree av block Low voltage, precordial leads Probable anteroseptal infarct, old Confirmed by Linwood Dibbles [phone]) on 09/03/2016 6:52:00 PM       Radiology Dg Chest 2 View  Result Date: 09/03/2016 CLINICAL DATA:  Type 2 diabetic upper chest pain possibly from unspecified fall. EXAM: CHEST  2 VIEW COMPARISON:  12/31/2010 FINDINGS: Status post median sternotomy. Left-sided pacemaker apparatus projects over the left lung base with right atrial and right ventricular leads noted. Mild vascular congestion is  seen without effusion or pneumothorax. No pulmonary consolidation. No acute nor suspicious osseous lesions. There is mild dextroconvex curvature lower thoracic spine. Aortic atherosclerosis of the aortic arch with mild ectasia of the thoracic aorta is visualized. No aortic aneurysm. Heart is normal in size. No acute nor suspicious osseous abnormalities. IMPRESSION: Mild pulmonary vascular congestion and aortic atherosclerosis. No acute fracture noted. Electronically Signed   By: Tollie Eth M.D.   On: 09/03/2016 19:31   Ct Head Wo Contrast  Result Date: 09/03/2016 CLINICAL DATA:  Per EMS, patient came from [address] (independent living for elderly) patient has a history of dementia, had a recent fall, pt c/o left arm shoulder arm. Patient is a poor historian due to dementia. EXAM: CT HEAD WITHOUT CONTRAST TECHNIQUE: Contiguous axial images were obtained from the base of the skull through the vertex without intravenous contrast. COMPARISON:  07/15/2010 FINDINGS: Brain: There is central and cortical atrophy. Periventricular white matter changes are consistent with small vessel disease. There is no intra or extra-axial fluid collection or mass lesion. The basilar cisterns and ventricles have a normal appearance. There is no CT evidence for acute infarction or hemorrhage. Vascular: There is dense atherosclerotic calcification of the internal carotid arteries. Skull: Normal. Negative for fracture or focal lesion. Sinuses/Orbits: No acute finding. Other: There is cerumen within the rightexternal auditory canal. IMPRESSION: 1. Atrophy and small vessel disease. 2.  No evidence for acute intracranial abnormality. 3. Impacted cerumen in the right external auditory canal. Electronically Signed   By: Norva Pavlov M.D.   On: 09/03/2016 20:28    Procedures Procedures (including critical care time)  Medications Ordered in ED Medications  ziprasidone (GEODON) injection 10 mg (not administered)    cefTRIAXone (ROCEPHIN) 1 g in dextrose 5 % 50 mL IVPB (not administered)  sodium chloride 0.9 % bolus 1,000 mL (not administered)  sterile water (preservative free) injection (not administered)  sodium chloride 0.9 % bolus 500 mL (0 mLs Intravenous Stopped 09/03/16 1916)  sodium chloride 0.9 % bolus 500 mL (500 mLs  Intravenous New Bag/Given 09/03/16 2052)     Initial Impression / Assessment and Plan / ED Course  I have reviewed the triage vital signs and the nursing notes.  Pertinent labs & imaging results that were available during my care of the patient were reviewed by me and considered in my medical decision making (see chart for details).  Clinical Course as of Sep 04 2138  Wed Sep 03, 2016  2139 Patient became agitated and upset when we mentioned having her admitted to the hospital. Patient is adamant that there is nothing wrong with her. She started striking out against staff. Dose of Geodon was given. I have ordered antibiotics and fluids for her UTI and acute kidney injury.  [JK]    Clinical Course User Index [JK] Linwood DibblesKnapp, Jaeanna Mccomber, MD  Pt presents to the ED with worsening behavioral issues.  Likely related to dementia.  Family is concerned about her living situation.  Labs notable for acute kidney injury and uti.  That may be contributing to the behavioral change.  Will start on abx.  Consult with medical service for admission.  Final Clinical Impressions(s) / ED Diagnoses   Final diagnoses:  Acute kidney injury (HCC)  Acute cystitis without hematuria  Dementia with behavioral disturbance, unspecified dementia type      Linwood DibblesKnapp, Darielys Giglia, MD 09/03/16 2140

## 2016-09-03 NOTE — H&P (Signed)
History and Physical    Valerie Carr BFX:[id number] DOB: [birth year] DOA: 09/03/2016  PCP: Harlan Stains, MD Consultants:  None Patient coming from: Home - lives alone; NOK: [phone]  Chief Complaint:  Not feeling well  HPI: Valerie Carr is a 83 y.o. female with medical history significant of pacemaker placement; HTN; HLD; DM; and dementia presenting due to daughter's concerns that patient is not safe living independently.  Patient was alone at the time of my evaluation and was alert and oriented x 3.  She reports "I ain't been feeling too good lately.  I just feel, I don't know how to explain it".  She went on to explain that it feels llike she has to move her feet and legs.  Eating and drinking ok.  No urinary symptoms.   ED Course: Patient with worsening behavioral issues, thought to be related to dementia.  Family is concerned about her living conditions.  AKI and UTI on evaluation, started abx.  Patient agitated and upset with the idea of admission and started striking out against staff.  Given Geodon.  Review of Systems: As per HPI; otherwise review of systems reviewed and negative.   Ambulatory Status:  Ambulates with a walker  Past Medical History:  Diagnosis Date  . Arteriosclerotic cardiovascular disease (ASCVD)   . Chronic anxiety    and depression  . Dementia   . DM type 2 (diabetes mellitus, type 2) ([place])   . Gallstones    asysmptomatic  . HTN (hypertension)   . Hyperlipidemia   . Sick sinus syndrome (HCC)    s/p PPM    Past Surgical History:  Procedure Laterality Date  . APPENDECTOMY    . CORONARY ARTERY BYPASS GRAFT     07-11-2005, triple bypass  . ESOPHAGOGASTRODUODENOSCOPY    . LAPAROTOMY N/A 04/20/2016   Procedure: EXPLORATORY LAPAROTOMY, SMALL BOWEL RESECTION, LYSIS OF ADHESIONS;  Surgeon: Armandina Gemma, MD;  Location: [facility] ORS;  Service: General;  Laterality: N/A;  . PACEMAKER INSERTION  2004   [employer] PPM implanted by Dr Leonia Reeves,  generator change by Dr Rayann Heman 01/07/11  . PERMANENT PACEMAKER GENERATOR CHANGE N/A 01/07/2011   Procedure: PERMANENT PACEMAKER GENERATOR CHANGE;  Surgeon: Thompson Grayer, MD;  Location: [facility] CATH LAB;  Service: Cardiovascular;  Laterality: N/A;  . TONSILLECTOMY    . TOTAL ABDOMINAL HYSTERECTOMY W/ BILATERAL SALPINGOOPHORECTOMY      Social History   Social History  . Marital status: Widowed    Spouse name: N/A  . Number of children: 3  . Years of education: 9   Occupational History  . retired    Social History Main Topics  . Smoking status: Former Smoker    Packs/day: 1.00    Years: 25.00    Quit date: 2001  . Smokeless tobacco: Former Systems developer  . Alcohol use No  . Drug use: No  . Sexual activity: No   Other Topics Concern  . Not on file   Social History Narrative   Patient lives at [place] [facility].   Caffeine Use: none   widowed    Allergies  Allergen Reactions  . Clarithromycin Other (See Comments)    unknown  . Codeine Phosphate Other (See Comments)    unknown  . Effexor [Venlafaxine Hydrochloride] Other (See Comments)    unknown  . Fexofenadine Hcl Other (See Comments)    unknown  . Lisinopril Other (See Comments)    unknown  . Prozac [Fluoxetine Hcl] Other (See Comments)  unknown  . Septra [Bactrim] Other (See Comments)    unknown  . Wellbutrin [Bupropion Hcl] Other (See Comments)    unknown    Family History  Problem Relation Age of Onset  . Diabetes Mother   . Hypertension Mother   . Coronary artery disease Father   . Diabetes Brother   . Throat cancer Brother   . Diabetes Brother   . Hypertension Sister   . Cancer Son        esophageal    Prior to Admission medications   Medication Sig Start Date End Date Taking? Authorizing Provider  donepezil (ARICEPT) 10 MG tablet TAKE 1 TABLET (10 MG TOTAL) BY MOUTH AT BEDTIME. 05/20/16  Yes Rosalin Hawking, MD  DULoxetine (CYMBALTA) 60 MG capsule Take 60 mg by mouth daily.    Yes [provider]  memantine (NAMENDA) 10 MG tablet Take 1 tablet (10 mg total) by mouth 2 (two) times daily. 01/11/13  Yes Philmore Pali, NP  metFORMIN (GLUCOPHAGE-XR) 500 MG 24 hr tablet Take 500 mg by mouth daily with breakfast.     Yes [provider]  omeprazole (PRILOSEC) 40 MG capsule Take 40 mg by mouth daily. 02/28/15  Yes [provider]  ONE TOUCH ULTRA TEST test strip TEST DAILY DX E11.51 06/19/14  Yes [provider]  QUEtiapine (SEROQUEL) 25 MG tablet Take 1 tablet (25 mg total) by mouth every morning. 11/08/15  Yes Rosalin Hawking, MD  rosuvastatin (CRESTOR) 20 MG tablet Take 20 mg by mouth daily. 03/21/16  Yes [provider]  traZODone (DESYREL) 100 MG tablet Take 100 mg by mouth at bedtime.     Yes [provider]  valsartan (DIOVAN) 160 MG tablet Take 160 mg by mouth daily.   Yes [provider]  traMADol (ULTRAM) 50 MG tablet Take 1-2 tablets (50-100 mg total) by mouth every 6 (six) hours as needed for moderate pain. Patient not taking: Reported on 09/03/2016 04/25/16   Kerney Elbe, DO    Physical Exam: Vitals:   09/03/16 1747 09/03/16 1930 09/03/16 2145 09/04/16 0006  BP:   (!) 177/71 (!) 158/58  Pulse:  81 80 75  Resp:  (!) 24 16 20   Temp:    97.8 F (36.6 C)  TempSrc:    Oral  SpO2:  96% 97% 98%  Weight: 54.4 kg (120 lb)   54.8 kg (120 lb 12.8 oz)  Height: 5' 7"  (1.702 m)   5' 7"  (1.702 m)     General:  Appears calm and comfortable and is NAD; none of the combative behaviors were present at all during our delightful encounter. Eyes:  PERRL, EOMI, normal lids, iris ENT:  grossly normal hearing, lips & tongue, mmm Neck:  no LAD, masses or thyromegaly; no carotid bruits Cardiovascular:  RRR, no m/r/g. No LE edema.  Respiratory:   CTA bilaterally with no wheezes/rales/rhonchi.  Normal respiratory effort. Abdomen:  soft, NT, ND, NABS Skin:  no rash or induration seen on limited exam Musculoskeletal:  grossly normal  tone BUE/BLE, good ROM, no bony abnormality Lower extremity:  No LE edema.  Limited foot exam with no ulcerations.  2+ distal pulses. Psychiatric:  grossly normal mood and affect, speech fluent and appropriate, AOx3 Neurologic:  CN 2-12 grossly intact, moves all extremities in coordinated fashion, sensation intact   Radiological Exams on Admission: Dg Chest 2 View  Result Date: 09/03/2016 CLINICAL DATA:  Type 2 diabetic upper chest pain possibly from unspecified fall. EXAM:  CHEST  2 VIEW COMPARISON:  12/31/2010 FINDINGS: Status post median sternotomy. Left-sided pacemaker apparatus projects over the left lung base with right atrial and right ventricular leads noted. Mild vascular congestion is seen without effusion or pneumothorax. No pulmonary consolidation. No acute nor suspicious osseous lesions. There is mild dextroconvex curvature lower thoracic spine. Aortic atherosclerosis of the aortic arch with mild ectasia of the thoracic aorta is visualized. No aortic aneurysm. Heart is normal in size. No acute nor suspicious osseous abnormalities. IMPRESSION: Mild pulmonary vascular congestion and aortic atherosclerosis. No acute fracture noted. Electronically Signed   By: Ashley Royalty M.D.   On: 09/03/2016 19:31   Ct Head Wo Contrast  Result Date: 09/03/2016 CLINICAL DATA:  Per EMS, patient came from [address] (independent living for elderly) patient has a history of dementia, had a recent fall, pt c/o left arm shoulder arm. Patient is a poor historian due to dementia. EXAM: CT HEAD WITHOUT CONTRAST TECHNIQUE: Contiguous axial images were obtained from the base of the skull through the vertex without intravenous contrast. COMPARISON:  07/15/2010 FINDINGS: Brain: There is central and cortical atrophy. Periventricular white matter changes are consistent with small vessel disease. There is no intra or extra-axial fluid collection or mass lesion. The basilar cisterns and ventricles have a  normal appearance. There is no CT evidence for acute infarction or hemorrhage. Vascular: There is dense atherosclerotic calcification of the internal carotid arteries. Skull: Normal. Negative for fracture or focal lesion. Sinuses/Orbits: No acute finding. Other: There is cerumen within the rightexternal auditory canal. IMPRESSION: 1. Atrophy and small vessel disease. 2.  No evidence for acute intracranial abnormality. 3. Impacted cerumen in the right external auditory canal. Electronically Signed   By: Nolon Nations M.D.   On: 09/03/2016 20:28    EKG: Independently reviewed.  Atrial pacing with rate 70; nonspecific ST changes with no evidence of acute ischemia  Labs on Admission: I have personally reviewed the available labs and imaging studies at the time of the admission.  Pertinent labs:   UA: many bacteria; moderate LE; 6-30 WBC Glucose 137 BUN 34/Creatinine 1.28/GFR 37; 6/0.8/>60 on 3/29 Hgb 10.3, normocytic; improved   Assessment/Plan Principal Problem:   AKI (acute kidney injury) ([place]) Active Problems:   Alzheimer's disease   Essential hypertension, benign   Cardiac pacemaker in situ   Acute lower UTI   AKI -Mild AKI, likely related to poor PO intake and dehydration -Hold Diovan for now -Anticipate rapid improvement and likely ready for d/c in AM  Possible UTI -Minimally abnormal UA and patient without symptoms -Given Rocephin IV in ER -Will hold additional antibiotics -Urine culture pending  Alzheimer's with behavioral disturbance -Patient apparently acted out while in the ER -Her daughter is quite concerned about her home safety -SW met with the daughter while in the ER but it is not clear whether the daughter was fully educated about Obs vs. Admit status -Will likely need ongoing education and PCP assistance with placement if the patient is willing -At the time of my evaluation, the patient was alert, oriented, and fully capable of participating in her own  decisions -Continue Aricept, Namenda, Seroquel, and Trazodone  Pacemaker in place -Paced rhythm  HTN -Hold Diovan  DVT prophylaxis: Lovenox  Code Status:  DNR - confirmed with patient Family Communication: None present Disposition Plan:  Home once clinically improved Consults called: SW  Admission status: It is my clinical opinion that referral for OBSERVATION is reasonable and necessary in this  patient based on the above information provided. The aforementioned taken together are felt to place the patient at high risk for further clinical deterioration. However it is anticipated that the patient may be medically stable for discharge from the hospital within 24 to 48 hours.     Karmen Bongo MD [facility]  If note is complete, please contact covering daytime or nighttime physician. [email or web address] Password [email or web address]  09/04/2016, 1:15 AM

## 2016-09-03 NOTE — ED Triage Notes (Signed)
Per EMS, [id number] (independent living for elderly) patient has a history of dementia, had a recent fall, pt c/o left arm shoulder arm. Patient is a poor historian due to dementia.
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
## 2016-09-03 NOTE — Care Management (Signed)
ED CM [facility] CS[facility] concerning patient  With history of  dementia with recurrent falls. [facility] with [facility] acute finding, family are concern that patient is no longer able to care for self. CM reviewed patient's record and [facility]  For RN/ Medical Social Worker. HH nurse is scheduled to visit patient tomorrow as per family. No fED CM needs identified at this time.
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
## 2016-09-03 NOTE — ED Notes (Signed)
Per patients daughter, no visitors except her boyfriend Aurther Lofterry.
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
## 2016-09-04 DIAGNOSIS — Z87891 Personal history of nicotine dependence: Secondary | ICD-10-CM | POA: Diagnosis not present

## 2016-09-04 DIAGNOSIS — I2510 Atherosclerotic heart disease of native coronary artery without angina pectoris: Secondary | ICD-10-CM | POA: Diagnosis present

## 2016-09-04 DIAGNOSIS — E119 Type 2 diabetes mellitus without complications: Secondary | ICD-10-CM | POA: Diagnosis not present

## 2016-09-04 DIAGNOSIS — G301 Alzheimer's disease with late onset: Secondary | ICD-10-CM | POA: Diagnosis not present

## 2016-09-04 DIAGNOSIS — F0391 Unspecified dementia with behavioral disturbance: Secondary | ICD-10-CM | POA: Diagnosis not present

## 2016-09-04 DIAGNOSIS — Z833 Family history of diabetes mellitus: Secondary | ICD-10-CM | POA: Diagnosis not present

## 2016-09-04 DIAGNOSIS — N179 Acute kidney failure, unspecified: Principal | ICD-10-CM

## 2016-09-04 DIAGNOSIS — E860 Dehydration: Secondary | ICD-10-CM | POA: Diagnosis present

## 2016-09-04 DIAGNOSIS — F0281 Dementia in other diseases classified elsewhere with behavioral disturbance: Secondary | ICD-10-CM

## 2016-09-04 DIAGNOSIS — Z8249 Family history of ischemic heart disease and other diseases of the circulatory system: Secondary | ICD-10-CM | POA: Diagnosis not present

## 2016-09-04 DIAGNOSIS — Z881 Allergy status to other antibiotic agents status: Secondary | ICD-10-CM | POA: Diagnosis not present

## 2016-09-04 DIAGNOSIS — I10 Essential (primary) hypertension: Secondary | ICD-10-CM

## 2016-09-04 DIAGNOSIS — N390 Urinary tract infection, site not specified: Secondary | ICD-10-CM | POA: Diagnosis not present

## 2016-09-04 DIAGNOSIS — Z885 Allergy status to narcotic agent status: Secondary | ICD-10-CM | POA: Diagnosis not present

## 2016-09-04 DIAGNOSIS — Z7984 Long term (current) use of oral hypoglycemic drugs: Secondary | ICD-10-CM | POA: Diagnosis not present

## 2016-09-04 DIAGNOSIS — E782 Mixed hyperlipidemia: Secondary | ICD-10-CM | POA: Diagnosis present

## 2016-09-04 DIAGNOSIS — Z888 Allergy status to other drugs, medicaments and biological substances status: Secondary | ICD-10-CM | POA: Diagnosis not present

## 2016-09-04 DIAGNOSIS — Z951 Presence of aortocoronary bypass graft: Secondary | ICD-10-CM | POA: Diagnosis not present

## 2016-09-04 DIAGNOSIS — N3000 Acute cystitis without hematuria: Secondary | ICD-10-CM | POA: Diagnosis not present

## 2016-09-04 DIAGNOSIS — Z882 Allergy status to sulfonamides status: Secondary | ICD-10-CM | POA: Diagnosis not present

## 2016-09-04 DIAGNOSIS — Z950 Presence of cardiac pacemaker: Secondary | ICD-10-CM | POA: Diagnosis not present

## 2016-09-04 DIAGNOSIS — Z808 Family history of malignant neoplasm of other organs or systems: Secondary | ICD-10-CM | POA: Diagnosis not present

## 2016-09-04 DIAGNOSIS — Z7982 Long term (current) use of aspirin: Secondary | ICD-10-CM | POA: Diagnosis not present

## 2016-09-04 DIAGNOSIS — F329 Major depressive disorder, single episode, unspecified: Secondary | ICD-10-CM | POA: Diagnosis present

## 2016-09-04 DIAGNOSIS — E1151 Type 2 diabetes mellitus with diabetic peripheral angiopathy without gangrene: Secondary | ICD-10-CM | POA: Diagnosis present

## 2016-09-04 DIAGNOSIS — Z90710 Acquired absence of both cervix and uterus: Secondary | ICD-10-CM | POA: Diagnosis not present

## 2016-09-04 DIAGNOSIS — E785 Hyperlipidemia, unspecified: Secondary | ICD-10-CM | POA: Diagnosis present

## 2016-09-04 DIAGNOSIS — F419 Anxiety disorder, unspecified: Secondary | ICD-10-CM | POA: Diagnosis present

## 2016-09-04 LAB — CBC
HEMATOCRIT: 29.4 % — AB (ref 36.0–46.0)
HEMOGLOBIN: 9.8 g/dL — AB (ref 12.0–15.0)
MCH: 27.1 pg (ref 26.0–34.0)
MCHC: 33.3 g/dL (ref 30.0–36.0)
MCV: 81.4 fL (ref 78.0–100.0)
Platelets: 172 10*3/uL (ref 150–400)
RBC: 3.61 MIL/uL — AB (ref 3.87–5.11)
RDW: 14.8 % (ref 11.5–15.5)
WBC: 5.4 10*3/uL (ref 4.0–10.5)

## 2016-09-04 LAB — GLUCOSE, CAPILLARY
GLUCOSE-CAPILLARY: 150 mg/dL — AB (ref 65–99)
GLUCOSE-CAPILLARY: 157 mg/dL — AB (ref 65–99)
GLUCOSE-CAPILLARY: 97 mg/dL (ref 65–99)
Glucose-Capillary: 150 mg/dL — ABNORMAL HIGH (ref 65–99)
Glucose-Capillary: 142 mg/dL — ABNORMAL HIGH (ref 65–99)

## 2016-09-04 LAB — MRSA PCR SCREENING: MRSA BY PCR: NEGATIVE

## 2016-09-04 LAB — BASIC METABOLIC PANEL
ANION GAP: 9 (ref 5–15)
BUN: 27 mg/dL — ABNORMAL HIGH (ref 6–20)
CHLORIDE: 109 mmol/L (ref 101–111)
CO2: 22 mmol/L (ref 22–32)
Calcium: 8.3 mg/dL — ABNORMAL LOW (ref 8.9–10.3)
Creatinine, Ser: 0.94 mg/dL (ref 0.44–1.00)
GFR calc non Af Amer: 54 mL/min — ABNORMAL LOW (ref >60)
GLUCOSE: 163 mg/dL — AB (ref 65–99)
POTASSIUM: 3.6 mmol/L (ref 3.5–5.1)
Sodium: 140 mmol/L (ref 135–145)

## 2016-09-04 MED ORDER — DEXTROSE 5 % IV SOLN
1.0000 g | INTRAVENOUS | Status: DC
  Administered 2016-09-04: 1 g via INTRAVENOUS
  Filled 2016-09-04: qty 10

## 2016-09-04 MED ORDER — QUETIAPINE FUMARATE 25 MG PO TABS
25.0000 mg | ORAL_TABLET | Freq: Once | ORAL | Status: AC
  Administered 2016-09-04: 25 mg via ORAL
  Filled 2016-09-04: qty 1

## 2016-09-04 MED ORDER — ENOXAPARIN SODIUM 40 MG/0.4ML ~~LOC~~ SOLN
40.0000 mg | Freq: Every day | SUBCUTANEOUS | Status: DC
  Administered 2016-09-04: 40 mg via SUBCUTANEOUS
  Filled 2016-09-04 (×2): qty 0.4

## 2016-09-04 NOTE — Telephone Encounter (Signed)
Call 3 times cant leave vm see notes:  Unable to leave message on daughters listed phone number. Rn call twice. The phone rings and plays music and no voice mail can be left.

## 2016-09-04 NOTE — Telephone Encounter (Signed)
If daughter calls back explain to her that she needs to call the home health agency we referred her to. We also receive a phone call from her that she deferred home health until Thursday because her mom had pink eye. If she feels mom is safe, pt may need to stay with her for safety reasons. Please tell her to call home health agency we referred her to thanks.  Unable to leave vm on patients daughter phone. The phone rang, and music played for 3 minutes. No prompt was given to leave a vm.Rn tried to call back twice.

## 2016-09-04 NOTE — Progress Notes (Signed)
Received call from pt's daughter stating, "I was told by my mom's doctor's office that if she stays 3 night in a hospital she automatically gets placed in a facility." CSW reiterated information provided during previous conversation with daughter (see CSW's previous note) regarding the process of placement and barriers involved, stated again that 3 night stay does not lead to "automatic placement," and that placement depends on pt's care needs, constraints of payor requirements, beds available (especially if locked unit needed), etc.  Pt's daughter became angry and stated, "I cannot care for her anymore, do you hear me? It will not be me! Y'all are not with her day in and day out! Y'all need to take this more seriously!" CSW de-escalated daughter and daughter began crying. CSW reiterated that pt continues to be monitored medically and also is awaiting therapy evaluation prior to being able to move forward with DC planning. Daughter agreed and ended call.   Kristl Morioka StoutIlean Skill, MSW, LCSW Clinical Social Work 09/04/2016 [phone]

## 2016-09-04 NOTE — Progress Notes (Signed)
PROGRESS NOTE    Valerie Carr  ZOX:[id number] DOB: [birth year] DOA: 09/03/2016 PCP: Laurann Montana, MD    Brief Narrative:  Patient with history of Alzheimer's dementia, hypertension, status post permanent pacemaker placement was brought to the ED due to concerns for patient safety with worsening behavioral issues. Patient noted to be in acute kidney injury with a UTI and subsequently admitted and placed empirically on IV antibiotics.   Assessment & Plan:   Principal Problem:   AKI (acute kidney injury) (HCC) Active Problems:   Alzheimer's disease   Essential hypertension, benign   Cardiac pacemaker in situ   Acute lower UTI  #1 acute kidney injury Likely secondary to prerenal azotemia in the setting of dehydration and ARB use. ARB on hold. Renal function improving with hydration. Follow.  #2 probable urinary tract infection Urine cultures pending. IV Rocephin.  #3 Alzheimer's dementia with behavioral disturbance It was noted that patient did act out while in the ED however admitting physician had a pleasant encounter with patient. Patient's daughter concern about mother's safety at home. Social work in discussions with daughter for patient's disposition and safety. Patient currently pleasant and following commands. Continue Aricept, Namenda, Seroquel and trazodone.  #4 status post pacemaker placement  #5 hypertension Continue to hold Diovan.   DVT prophylaxis: Lovenox Code Status: Full Family Communication: Updated patient. No family at bedside. Disposition Plan: Back home when medically stable hopefully tomorrow.   Consultants:  None  Procedures:   CT head 09/03/2016  Chest x-ray 09/03/2016  Antimicrobials:   IV Rocephin 09/03/2016   Subjective: Patient states ambulating in the hallway. No chest pain. No shortness of breath.  Objective: Vitals:   09/03/16 1930 09/03/16 2145 09/04/16 0006 09/04/16 0631  BP:  (!) 177/71 (!) 158/58 (!) 135/50  Pulse: 81  80 75 70  Resp: (!) 24 16 20 18   Temp:   97.8 F (36.6 C) 97.9 F (36.6 C)  TempSrc:   Oral Oral  SpO2: 96% 97% 98% 98%  Weight:   54.8 kg (120 lb 12.8 oz)   Height:   5\' 7"  (1.702 m)     Intake/Output Summary (Last 24 hours) at 09/04/16 1214 Last data filed at 09/04/16 0612  Gross per 24 hour  Intake           2497.5 ml  Output                0 ml  Net           2497.5 ml   Filed Weights   09/03/16 1747 09/04/16 0006  Weight: 54.4 kg (120 lb) 54.8 kg (120 lb 12.8 oz)    Examination:  General exam: Appears calm and comfortable  Respiratory system: Clear to auscultation. Respiratory effort normal. Cardiovascular system: S1 & S2 heard, RRR. No JVD, murmurs, rubs, gallops or clicks. No pedal edema. Gastrointestinal system: Abdomen is nondistended, soft and nontender. No organomegaly or masses felt. Normal bowel sounds heard. Central nervous system: Alert and oriented. No focal neurological deficits. Extremities: Symmetric 5 x 5 power. Skin: No rashes, lesions or ulcers Psychiatry: Judgement and insight appear normal. Mood & affect appropriate.     Data Reviewed: I have personally reviewed following labs and imaging studies  CBC:  Recent Labs Lab 09/03/16 1831 09/04/16 0339  WBC 5.6 5.4  NEUTROABS 3.2  --   HGB 10.3* 9.8*  HCT 31.2* 29.4*  MCV 79.4 81.4  PLT 211 172   Basic Metabolic Panel:  Recent Labs Lab  09/03/16 1831 09/04/16 0339  NA 137 140  K 4.3 3.6  CL 106 109  CO2 25 22  GLUCOSE 137* 163*  BUN 34* 27*  CREATININE 1.28* 0.94  CALCIUM 8.9 8.3*   GFR: Estimated Creatinine Clearance: 38.5 mL/min (by C-G formula based on SCr of 0.94 mg/dL). Liver Function Tests:  Recent Labs Lab 09/03/16 1831  AST 21  ALT 13*  ALKPHOS 46  BILITOT 0.5  PROT 7.2  ALBUMIN 3.8   No results for input(s): LIPASE, AMYLASE in the last 168 hours. No results for input(s): AMMONIA in the last 168 hours. Coagulation Profile: No results for input(s): INR, PROTIME  in the last 168 hours. Cardiac Enzymes: No results for input(s): CKTOTAL, CKMB, CKMBINDEX, TROPONINI in the last 168 hours. BNP (last 3 results) No results for input(s): PROBNP in the last 8760 hours. HbA1C: No results for input(s): HGBA1C in the last 72 hours. CBG:  Recent Labs Lab 09/03/16 2358 09/04/16 0741 09/04/16 1210  GLUCAP 97 150* 157*   Lipid Profile: No results for input(s): CHOL, HDL, LDLCALC, TRIG, CHOLHDL, LDLDIRECT in the last 72 hours. Thyroid Function Tests: No results for input(s): TSH, T4TOTAL, FREET4, T3FREE, THYROIDAB in the last 72 hours. Anemia Panel: No results for input(s): VITAMINB12, FOLATE, FERRITIN, TIBC, IRON, RETICCTPCT in the last 72 hours. Sepsis Labs: No results for input(s): PROCALCITON, LATICACIDVEN in the last 168 hours.  No results found for this or any previous visit (from the past 240 hour(s)).       Radiology Studies: Dg Chest 2 View  Result Date: 09/03/2016 CLINICAL DATA:  Type 2 diabetic upper chest pain possibly from unspecified fall. EXAM: CHEST  2 VIEW COMPARISON:  12/31/2010 FINDINGS: Status post median sternotomy. Left-sided pacemaker apparatus projects over the left lung base with right atrial and right ventricular leads noted. Mild vascular congestion is seen without effusion or pneumothorax. No pulmonary consolidation. No acute nor suspicious osseous lesions. There is mild dextroconvex curvature lower thoracic spine. Aortic atherosclerosis of the aortic arch with mild ectasia of the thoracic aorta is visualized. No aortic aneurysm. Heart is normal in size. No acute nor suspicious osseous abnormalities. IMPRESSION: Mild pulmonary vascular congestion and aortic atherosclerosis. No acute fracture noted. Electronically Signed   By: Tollie Ethavid  Kwon M.D.   On: 09/03/2016 19:31   Ct Head Wo Contrast  Result Date: 09/03/2016 CLINICAL DATA:  Per EMS, patient came from [id number] (independent living for elderly) patient has  a history of dementia, had a recent fall, pt c/o left arm shoulder arm. Patient is a poor historian due to dementia. EXAM: CT HEAD WITHOUT CONTRAST TECHNIQUE: Contiguous axial images were obtained from the base of the skull through the vertex without intravenous contrast. COMPARISON:  07/15/2010 FINDINGS: Brain: There is central and cortical atrophy. Periventricular white matter changes are consistent with small vessel disease. There is no intra or extra-axial fluid collection or mass lesion. The basilar cisterns and ventricles have a normal appearance. There is no CT evidence for acute infarction or hemorrhage. Vascular: There is dense atherosclerotic calcification of the internal carotid arteries. Skull: Normal. Negative for fracture or focal lesion. Sinuses/Orbits: No acute finding. Other: There is cerumen within the rightexternal auditory canal. IMPRESSION: 1. Atrophy and small vessel disease. 2.  No evidence for acute intracranial abnormality. 3. Impacted cerumen in the right external auditory canal. Electronically Signed   By: Norva PavlovElizabeth  Brown M.D.   On: 09/03/2016 20:28        Scheduled Meds: .  docusate sodium  100 mg Oral BID  . donepezil  10 mg Oral QHS  . DULoxetine  60 mg Oral Daily  . enoxaparin (LOVENOX) injection  40 mg Subcutaneous QHS  . insulin aspart  0-5 Units Subcutaneous QHS  . insulin aspart  0-9 Units Subcutaneous TID WC  . memantine  10 mg Oral BID  . pantoprazole  40 mg Oral Daily  . QUEtiapine  25 mg Oral q morning - 10a  . rosuvastatin  20 mg Oral q1800  . traZODone  100 mg Oral QHS   Continuous Infusions: . sodium chloride 75 mL/hr at 09/04/16 0014     LOS: 0 days    Time spent: 35 minutes    Markey Deady, MD [facility] Pager [phone]  If 7PM-7AM, please contact night-coverage [email or web address] Password [email or web address] 09/04/2016, 12:14 PM

## 2016-09-04 NOTE — Care Management Note (Signed)
Case Management Note  Patient Details  Name: Valerie Carr MRN: [id number] Date of Birth: 01/24/1930  Subjective/Objective: 83 y/o f admitted w/AKI. [facility] [facility] Hx: dementia. Noted w/recent visit to ED-see CM note. Spoke to dtr(Marylu Dudenhoeffer Welcome-c#[phone]) dtr very e on phone about d/c plan-[facility]-per dtr they had not seen patient from recent d/c. Explained observation status-dtr voiced understanding. Informed of possible d/c in am-she is very upset about not having the help for her mother without having to pay for it since she cannot afford to pay out of pocket. Per CSW & confirmed w/HHC agency [facility] rep-dtr is more concerned about going to the beach than take care of her mother. Dtr said to me that she would not be available to pick her mother up @ d/c-once I informed her that that would be abandondment if she was not available when we called her to pick up her mother-she then said someone would be available to pick her up. She wants a call prior so she can make arrangements for pick up. Per [facility]-they have tried to make visits & dtr have changed the date which would have been today-while dtr was @ the beach. [facility] rep Kenard GowerDrew just now called dtr & her phone said it was full & unable to take messages.  HHC orders to be placed-[facility]. MD updated.                 Action/Plan:   Expected Discharge Date:                  Expected Discharge Plan:  Home w Home Health Services  In-House Referral:     Discharge planning Services  CM Consult  Post Acute Care Choice:    Choice offered to:  Adult Children  DME Arranged:    DME Agency:     HH Arranged:  RN, PT, OT, Nurse's Aide, Social Work [facility]  Status of Service:     If discussed at [employer] Meetings, dates discussed:    Additional Comments:  Lanier ClamMahabir, Eulice [facility], RN 09/04/2016, 3:03 PM

## 2016-09-04 NOTE — Evaluation (Signed)
Physical Therapy Evaluation Patient Details Name: Valerie Carr MRN: [id number] DOB: Jul 26, 1929 Today's Date: 09/04/2016   History of Present Illness  Valerie Carr is a 83 y.o. female with medical history significant of pacemaker placement; HTN; HLD; DM; and dementia presenting 09/03/16  due to daughter's concerns that patient is not safe living independently.  Clinical Impression  The  Patient is pleasantly confused, does not think her daughter knows where she is. The patient is mobilizing well, ambulalted with the RW x 400' and was noted to ambulate with nursing prior to PT walking the patient and patient  Did not recall. Pt admitted with above diagnosis. Pt currently with functional limitations due to the deficits listed below (see PT Problem List). Pt will benefit from skilled PT to increase their independence and safety with mobility to allow discharge to the venue listed below.       Follow Up Recommendations Supervision/Assistance - 24 hour;No PT follow up    Equipment Recommendations  None recommended by PT    Recommendations for Other Services       Precautions / Restrictions Precautions Precautions: Fall      Mobility  Bed Mobility Overal bed mobility: Needs Assistance Bed Mobility: Supine to Sit           General bed mobility comments: sitting on bed edge when PT arrived, bed rail still up.  Transfers Overall transfer level: Needs assistance Equipment used: Rolling walker (2 wheeled) Transfers: Sit to/from Stand Sit to Stand: Supervision         General transfer comment: cues for safety  Ambulation/Gait Ambulation/Gait assistance: Min guard Ambulation Distance (Feet): 400 Feet Assistive device: Rolling walker (2 wheeled) Gait Pattern/deviations: Step-through pattern   Gait velocity interpretation: Below normal speed for age/gender General Gait Details: patient frequently stopped while walking. No balance losses noted. the patient frequently stated  that she was ready to go home  Stairs            Wheelchair Mobility    Modified Rankin (Stroke Patients Only)       Balance Overall balance assessment: Needs assistance   Sitting balance-Leahy Scale: Normal     Standing balance support: During functional activity;No upper extremity supported Standing balance-Leahy Scale: Fair Standing balance comment: able to  get up from toilet, stand and wash hands and walk to bed without any UE support, cues for safety around objects due to not being aware and  talking about not                              Pertinent Vitals/Pain Pain Assessment: No/denies pain    Home Living Family/patient expects to be discharged to:: Skilled nursing facility                 Additional Comments: per chart per daughter, patient has not been taking care of  self,,daughter checks on frequently.    Prior Function Level of Independence: Independent         Comments: doesn't drive     Hand Dominance        Extremity/Trunk Assessment   Upper Extremity Assessment Upper Extremity Assessment: Overall WFL for tasks assessed    Lower Extremity Assessment Lower Extremity Assessment: Overall WFL for tasks assessed    Cervical / Trunk Assessment Cervical / Trunk Assessment: Normal  Communication   Communication: No difficulties  Cognition     Overall Cognitive Status: History of cognitive impairments -  at baseline Area of Impairment: Orientation;Attention;Memory;Safety/judgement;Awareness                 Orientation Level: Place;Time;Situation Current Attention Level: Selective Memory: Decreased short-term memory   Safety/Judgement: Decreased awareness of safety Awareness: Emergent   General Comments: the patient  does not think that the daughter knows where she is.      General Comments      Exercises     Assessment/Plan    PT Assessment Patient needs continued PT services  PT Problem List  Decreased strength;Decreased range of motion;Decreased activity tolerance;Decreased balance;Decreased mobility;Decreased knowledge of precautions;Decreased safety awareness;Decreased knowledge of use of DME;Decreased cognition       PT Treatment Interventions DME instruction;Gait training;Functional mobility training;Therapeutic activities;Patient/family education    PT Goals (Current goals can be found in the Care Plan section)  Acute Rehab PT Goals PT Goal Formulation: Patient unable to participate in goal setting Time For Goal Achievement: 09/11/16 Potential to Achieve Goals: Good    Frequency Min 2X/week   Barriers to discharge Decreased caregiver support      Co-evaluation               AM-PAC PT "6 Clicks" Daily Activity  Outcome Measure Difficulty turning over in bed (including adjusting bedclothes, sheets and blankets)?: A Little Difficulty moving from lying on back to sitting on the side of the bed? : A Little Difficulty sitting down on and standing up from a chair with arms (e.g., wheelchair, bedside commode, etc,.)?: A Little Help needed moving to and from a bed to chair (including a wheelchair)?: Total Help needed walking in hospital room?: Total Help needed climbing 3-5 steps with a railing? : Total 6 Click Score: 12    End of Session Equipment Utilized During Treatment: Gait belt Activity Tolerance: Patient tolerated treatment well Patient left: with call bell/phone within reach;with bed alarm set Nurse Communication: Mobility status PT Visit Diagnosis: Difficulty in walking, not elsewhere classified (R26.2)    Time: 1140-1150 PT Time Calculation (min) (ACUTE ONLY): 10 min   Charges:   PT Evaluation $PT Eval Low Complexity: 1 Low     PT G Codes:   PT G-Codes **NOT FOR INPATIENT CLASS** Functional Limitation: Changing and maintaining body position Changing and Maintaining Body Position Current Status (U9811(G8981): At least 1 percent but less than 20  percent impaired, limited or restricted Changing and Maintaining Body Position Goal Status (B1478(G8982): 0 percent impaired, limited or restricted    [facility] Tecla Mailloux PT [phone]  Rada HayHill, Shabreka Coulon Elizabeth 09/04/2016, 1:38 PM

## 2016-09-04 NOTE — Progress Notes (Signed)
Reviewed recommendations of PT evaluation - recommend supervision/assistance 24 hr. Discussed case with CSW AD- advised ALF placement from hospital not recommended as pt has family involvement and means for obtaining 24/7 supervision. Advised family can continue ALF placement from community.  Discussed with MD and CM. CM discussed with pt's daughter and [facility] ([facility]  CSW signing off, please consult as needs arise.   Ilean SkillMeghan Teigan Manner, MSW, LCSW Clinical Social Work 09/04/2016 [phone]
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
## 2016-09-04 NOTE — Telephone Encounter (Signed)
Pt's daughter returned RN's call, she said the pt has been admitted to the hospital for observation. FYI

## 2016-09-04 NOTE — Care Management Obs Status (Signed)
MEDICARE OBSERVATION STATUS NOTIFICATION   Patient Details  Name: Valerie Carr MRN: [id number] Date of Birth: 07-09-1929   Medicare Observation Status Notification Given:  Yes    MahabirOlegario Messier, Kersten Salmons, RN 09/04/2016, 3:02 PM

## 2016-09-05 DIAGNOSIS — E119 Type 2 diabetes mellitus without complications: Secondary | ICD-10-CM

## 2016-09-05 DIAGNOSIS — F0391 Unspecified dementia with behavioral disturbance: Secondary | ICD-10-CM

## 2016-09-05 DIAGNOSIS — F03918 Unspecified dementia, unspecified severity, with other behavioral disturbance: Secondary | ICD-10-CM

## 2016-09-05 LAB — BASIC METABOLIC PANEL
ANION GAP: 7 (ref 5–15)
BUN: 16 mg/dL (ref 6–20)
CO2: 25 mmol/L (ref 22–32)
Calcium: 8.6 mg/dL — ABNORMAL LOW (ref 8.9–10.3)
Chloride: 108 mmol/L (ref 101–111)
Creatinine, Ser: 0.84 mg/dL (ref 0.44–1.00)
GFR calc Af Amer: >60 mL/min (ref >60)
GFR calc non Af Amer: >60 mL/min (ref >60)
GLUCOSE: 126 mg/dL — AB (ref 65–99)
POTASSIUM: 3.7 mmol/L (ref 3.5–5.1)
Sodium: 140 mmol/L (ref 135–145)

## 2016-09-05 LAB — CBC
HCT: 29 % — ABNORMAL LOW (ref 36.0–46.0)
HEMOGLOBIN: 9.5 g/dL — AB (ref 12.0–15.0)
MCH: 25.8 pg — AB (ref 26.0–34.0)
MCHC: 32.8 g/dL (ref 30.0–36.0)
MCV: 78.8 fL (ref 78.0–100.0)
Platelets: 174 10*3/uL (ref 150–400)
RBC: 3.68 MIL/uL — ABNORMAL LOW (ref 3.87–5.11)
RDW: 14.5 % (ref 11.5–15.5)
WBC: 3.8 10*3/uL — ABNORMAL LOW (ref 4.0–10.5)

## 2016-09-05 LAB — GLUCOSE, CAPILLARY
GLUCOSE-CAPILLARY: 138 mg/dL — AB (ref 65–99)
Glucose-Capillary: 147 mg/dL — ABNORMAL HIGH (ref 65–99)

## 2016-09-05 LAB — URINE CULTURE

## 2016-09-05 MED ORDER — CEFUROXIME AXETIL 500 MG PO TABS: 500.0000 mg | ORAL_TABLET | Freq: Two times a day (BID) | ORAL | 0 refills | Status: AC

## 2016-09-05 MED ORDER — CEFUROXIME AXETIL 500 MG PO TABS
500.0000 mg | ORAL_TABLET | Freq: Two times a day (BID) | ORAL | Status: DC
  Filled 2016-09-05: qty 1

## 2016-09-05 NOTE — Care Management Note (Signed)
Case Management Note  Patient Details  Name: Valerie Carr MRN: [id number] Date of Birth: 12-Jan-1930  Subjective/Objective: Dtr agreed to shower stool-[facility] dme rep Jermaine aware & will bring to rm prior d/c. Nsg aware. No further CM needs                   Action/Plan:d/c home w/HHC/dme   Expected Discharge Date:  09/05/16               Expected Discharge Plan:  Home w Home Health Services  In-House Referral:     Discharge planning Services  CM Consult  Post Acute Care Choice:    Choice offered to:  Adult Children  DME Arranged:  Shower stool DME Agency:  [facility]  HH Arranged:  RN, PT, OT, Nurse's Aide, Social Work [facility]  [facility]  Status of Service:     If discussed at [employer] of [employer] Meetings, dates discussed:    Additional Comments:  Lanier ClamMahabir, Mianna Iezzi, RN 09/05/2016, 2:30 PM

## 2016-09-05 NOTE — Discharge Summary (Signed)
Physician Discharge Summary  Valerie Carr ZOX:[id number] DOB: May 30, 1929 DOA: 09/03/2016  PCP: Laurann Montana, MD  Admit date: 09/03/2016 Discharge date: 09/05/2016  Time spent: 60 minutes  Recommendations for Outpatient Follow-up:  1. Follow-up with Laurann Montana, MD in 2 weeks. On follow-up patient will need a basic metabolic profile done to follow-up on electrolytes and renal function. Patient blood pressure also needs to be reassessed.   Discharge Diagnoses:  Principal Problem:   AKI (acute kidney injury) (HCC) Active Problems:   Alzheimer's disease   Essential hypertension, benign   Cardiac pacemaker in situ   Acute lower UTI   Acute cystitis without hematuria   Discharge Condition: Stable and improved  Diet recommendation: Carb modified  Filed Weights   09/03/16 1747 09/04/16 0006  Weight: 54.4 kg (120 lb) 54.8 kg (120 lb 12.8 oz)    History of present illness:  Per Dr. Lorn Junes is a 83 y.o. female with medical history significant of pacemaker placement; HTN; HLD; DM; and dementia presented due to daughter's concerns that patient was not safe living independently.  Patient was alone at the time of evaluation and was alert and oriented x 3.  She reported "I ain't been feeling too good lately.  I just feel, I don't know how to explain it".  She went on to explain that it feels llike she has to move her feet and legs.  Eating and drinking ok.  No urinary symptoms.   ED Course: Patient with worsening behavioral issues, thought to be related to dementia.  Family was concerned about her living conditions.  AKI and UTI on evaluation, started abx.  Patient agitated and upset with the idea of admission and started striking out against staff.  Given Geodon.  Hospital Course:  #1 acute kidney injury Likely secondary to prerenal azotemia in the setting of dehydration and ARB use. ARB Was held during the hospitalization. Patient was hydrated with IV fluids with  resolution of acute kidney injury by day of discharge. Outpatient follow-up.   #2 probable urinary tract infection Urinalysis obtained was concerning for UTI. Patient was placed empirically on IV Rocephin. Patient improved clinically. Urine cultures which were done showed multiple species. Patient was subsequently transitioned to oral Ceftin and will be discharged on 3 more days of oral Ceftin to complete a five-day course of antibiotic treatment.   #3 Alzheimer's dementia with behavioral disturbance It was noted that patient did act out while in the ED however admitting physician had a pleasant encounter with patient. Patient's daughter concerned about mother's safety at home per admitting physician. Social work in discussions with daughter for patient's disposition and safety. Patient currently pleasant and following commands. Continued on home regimen of Aricept, Namenda, Seroquel and trazodone.  #4 status post pacemaker placement  #5 hypertension Patient Diovan was held during the hospitalization due to acute kidney injury and dehydration. Patient blood pressure remained stable. Diovan will be resumed on discharge. Outpatient follow-up.   Procedures:  CT head 09/03/2016  Chest x-ray 09/03/2016  Consultations:  None  Discharge Exam: Vitals:   09/04/16 2036 09/05/16 0438  BP: 132/76 (!) 142/68  Pulse: 76 68  Resp: 18 18  Temp: 98.5 F (36.9 C) 98.3 F (36.8 C)  SpO2: 99% 98%    General: NAD Cardiovascular: RRR Respiratory: CTAB  Discharge Instructions   Discharge Instructions    Diet Carb Modified    Complete by:  As directed    Increase activity slowly    Complete  by:  As directed      Current Discharge Medication List    START taking these medications   Details  cefUROXime (CEFTIN) 500 MG tablet Take 1 tablet (500 mg total) by mouth 2 (two) times daily with a meal. Take for 3 days then stop. Qty: 6 tablet, Refills: 0      CONTINUE these medications  which have NOT CHANGED   Details  donepezil (ARICEPT) 10 MG tablet TAKE 1 TABLET (10 MG TOTAL) BY MOUTH AT BEDTIME. Qty: 90 tablet, Refills: 3    DULoxetine (CYMBALTA) 60 MG capsule Take 60 mg by mouth daily.     memantine (NAMENDA) 10 MG tablet Take 1 tablet (10 mg total) by mouth 2 (two) times daily. Qty: 60 tablet, Refills: 5   Associated Diagnoses: Alzheimer's disease    metFORMIN (GLUCOPHAGE-XR) 500 MG 24 hr tablet Take 500 mg by mouth daily with breakfast.      omeprazole (PRILOSEC) 40 MG capsule Take 40 mg by mouth daily. Refills: 5    ONE TOUCH ULTRA TEST test strip TEST DAILY DX E11.51 Refills: 5    QUEtiapine (SEROQUEL) 25 MG tablet Take 1 tablet (25 mg total) by mouth every morning. Qty: 90 tablet, Refills: 2   Associated Diagnoses: Late onset Alzheimer's disease with behavioral disturbance    rosuvastatin (CRESTOR) 20 MG tablet Take 20 mg by mouth daily.    traZODone (DESYREL) 100 MG tablet Take 100 mg by mouth at bedtime.      valsartan (DIOVAN) 160 MG tablet Take 160 mg by mouth daily.       Allergies  Allergen Reactions  . Clarithromycin Other (See Comments)    unknown  . Codeine Phosphate Other (See Comments)    unknown  . Effexor [Venlafaxine Hydrochloride] Other (See Comments)    unknown  . Fexofenadine Hcl Other (See Comments)    unknown  . Lisinopril Other (See Comments)    unknown  . Prozac [Fluoxetine Hcl] Other (See Comments)    unknown  . Septra [Bactrim] Other (See Comments)    unknown  . Wellbutrin [Bupropion Hcl] Other (See Comments)    unknown   Follow-up Information    Dorann OuWinston, [facility] Follow up.   Specialty:  Home Health Services [facility]:  [facility],physical/occupational therapy,aide,social worker Contact information: [address] [place] [address] [phone]        Laurann MontanaWhite, Cynthia, MD. Schedule an appointment as soon as possible for a visit in 2 week(s).   Specialty:  Family Medicine Contact  information: [phone] [address] [place] [address] [phone]            The results of significant diagnostics from this hospitalization (including imaging, microbiology, ancillary and laboratory) are listed below for reference.    Significant Diagnostic Studies: Dg Chest 2 View  Result Date: 09/03/2016 CLINICAL DATA:  Type 2 diabetic upper chest pain possibly from unspecified fall. EXAM: CHEST  2 VIEW COMPARISON:  12/31/2010 FINDINGS: Status post median sternotomy. Left-sided pacemaker apparatus projects over the left lung base with right atrial and right ventricular leads noted. Mild vascular congestion is seen without effusion or pneumothorax. No pulmonary consolidation. No acute nor suspicious osseous lesions. There is mild dextroconvex curvature lower thoracic spine. Aortic atherosclerosis of the aortic arch with mild ectasia of the thoracic aorta is visualized. No aortic aneurysm. Heart is normal in size. No acute nor suspicious osseous abnormalities. IMPRESSION: Mild pulmonary vascular congestion and aortic atherosclerosis. No acute fracture  noted. Electronically Signed   By: Tollie Eth M.D.   On: 09/03/2016 19:31   Ct Head Wo Contrast  Result Date: 09/03/2016 CLINICAL DATA:  Per EMS, patient came from [address] (independent living for elderly) patient has a history of dementia, had a recent fall, pt c/o left arm shoulder arm. Patient is a poor historian due to dementia. EXAM: CT HEAD WITHOUT CONTRAST TECHNIQUE: Contiguous axial images were obtained from the base of the skull through the vertex without intravenous contrast. COMPARISON:  07/15/2010 FINDINGS: Brain: There is central and cortical atrophy. Periventricular white matter changes are consistent with small vessel disease. There is no intra or extra-axial fluid collection or mass lesion. The basilar cisterns and ventricles have a normal appearance. There is no CT evidence for acute infarction or  hemorrhage. Vascular: There is dense atherosclerotic calcification of the internal carotid arteries. Skull: Normal. Negative for fracture or focal lesion. Sinuses/Orbits: No acute finding. Other: There is cerumen within the rightexternal auditory canal. IMPRESSION: 1. Atrophy and small vessel disease. 2.  No evidence for acute intracranial abnormality. 3. Impacted cerumen in the right external auditory canal. Electronically Signed   By: Norva Pavlov M.D.   On: 09/03/2016 20:28    Microbiology: Recent Results (from the past 240 hour(s))  Culture, Urine     Status: Abnormal   Collection Time: 09/03/16  7:35 PM  Result Value Ref Range Status   Specimen Description URINE, CLEAN CATCH  Final   Special Requests NONE  Final   Culture MULTIPLE SPECIES PRESENT, SUGGEST RECOLLECTION (A)  Final   Report Status 09/05/2016 FINAL  Final  MRSA PCR Screening     Status: None   Collection Time: 09/04/16  2:00 AM  Result Value Ref Range Status   MRSA by PCR NEGATIVE NEGATIVE Final    Comment:        The GeneXpert MRSA Assay (FDA approved for NASAL specimens only), is one component of a comprehensive MRSA colonization surveillance program. It is not intended to diagnose MRSA infection nor to guide or monitor treatment for MRSA infections.      Labs: Basic Metabolic Panel:  Recent Labs Lab 09/03/16 1831 09/04/16 0339 09/05/16 0317  NA 137 140 140  K 4.3 3.6 3.7  CL 106 109 108  CO2 25 22 25   GLUCOSE 137* 163* 126*  BUN 34* 27* 16  CREATININE 1.28* 0.94 0.84  CALCIUM 8.9 8.3* 8.6*   Liver Function Tests:  Recent Labs Lab 09/03/16 1831  AST 21  ALT 13*  ALKPHOS 46  BILITOT 0.5  PROT 7.2  ALBUMIN 3.8   No results for input(s): LIPASE, AMYLASE in the last 168 hours. No results for input(s): AMMONIA in the last 168 hours. CBC:  Recent Labs Lab 09/03/16 1831 09/04/16 0339 09/05/16 0317  WBC 5.6 5.4 3.8*  NEUTROABS 3.2  --   --   HGB 10.3* 9.8* 9.5*  HCT 31.2* 29.4*  29.0*  MCV 79.4 81.4 78.8  PLT 211 172 174   Cardiac Enzymes: No results for input(s): CKTOTAL, CKMB, CKMBINDEX, TROPONINI in the last 168 hours. BNP: BNP (last 3 results) No results for input(s): BNP in the last 8760 hours.  ProBNP (last 3 results) No results for input(s): PROBNP in the last 8760 hours.  CBG:  Recent Labs Lab 09/04/16 1210 09/04/16 1732 09/04/16 2001 09/05/16 0812 09/05/16 1130  GLUCAP 157* 150* 142* 138* 147*       Signed:  Woodie Trusty MD.  Emmie Niemann  Hospitalists 09/05/2016, 12:32 PM

## 2016-09-05 NOTE — Evaluation (Signed)
Occupational Therapy Evaluation Patient Details Name: Valerie Carr Stanfield MRN: [id number] DOB: 10/09/1929 Today's Date: 09/05/2016    History of Present Illness Valerie Carr Gruenberg is a 83 y.o. female with medical history significant of pacemaker placement; HTN; HLD; DM; and dementia presenting 09/03/16  due to daughter's concerns that patient is not safe living independently.   Clinical Impression   Pt was admitted for the above.  Will follow in acute to increase safety with adls.  Pt unsteady but did not have LOB. Pt needs min guard at this time due to unsteadiness/unpredictability. She needs supervision to sequence and completely perform ADLs.  Goals are for supervision only    Follow Up Recommendations  ?ALF vs 24/7   Equipment Recommendations   (possibly shower seat)    Recommendations for Other Services       Precautions / Restrictions Precautions Precautions: Fall      Mobility Bed Mobility   Bed Mobility: Supine to Sit           General bed mobility comments: light min A to sit up  Transfers Overall transfer level: Needs assistance Equipment used: None Transfers: Sit to/from Stand Sit to Stand: Supervision              Balance               Standing balance comment: pt a little unsteady but no LOB                           ADL either performed or assessed with clinical judgement   ADL Overall ADL's : Needs assistance/impaired Eating/Feeding: Independent   Grooming: Supervision/safety;Wash/dry hands;Wash/dry face;Standing   Upper Body Bathing: Supervision/ safety;Standing   Lower Body Bathing: Supervison/ safety;Sit to/from stand   Upper Body Dressing : Set up;Sitting   Lower Body Dressing: Supervision/safety;Sit to/from stand   Toilet Transfer: Min guard;Comfort height toilet;Ambulation   Toileting- Clothing Manipulation and Hygiene: Supervision/safety;Sit to/from stand         General ADL Comments: performed adl in bathroom.   Pt needed cues for throughness/completeness and to use soap.  Pt lived alone--she may have had this difficulty PTA     Vision         Perception     Praxis      Pertinent Vitals/Pain Pain Assessment: No/denies pain     Hand Dominance     Extremity/Trunk Assessment Upper Extremity Assessment Upper Extremity Assessment: Overall WFL for tasks assessed           Communication Communication Communication: No difficulties   Cognition Arousal/Alertness: Awake/alert   Overall Cognitive Status: History of cognitive impairments - at baseline Area of Impairment: Orientation;Attention;Memory;Safety/judgement;Awareness                 Orientation Level: Place;Time;Situation Current Attention Level: Sustained Memory: Decreased short-term memory   Safety/Judgement: Decreased awareness of safety     General Comments: cues for thoroughness   General Comments       Exercises     Shoulder Instructions      Home Living Family/patient expects to be discharged to:: Other (Comment) (?ALF)                                 Additional Comments: per chart per daughter, patient has not been taking care of  self,,daughter checks on frequently.      Prior Functioning/Environment Level  of Independence: Independent (unsure of thoroughness)        Comments: doesn't drive        OT Problem List: Decreased cognition;Impaired balance (sitting and/or standing)      OT Treatment/Interventions: Self-care/ADL training;Cognitive remediation/compensation;Patient/family education    OT Goals(Current goals can be found in the care plan section) Acute Rehab OT Goals Patient Stated Goal: none stated OT Goal Formulation: With patient (general terms) Time For Goal Achievement: 09/12/16 Potential to Achieve Goals: Fair ADL Goals Pt Will Transfer to Toilet: with supervision;ambulating;regular height toilet Additional ADL Goal #1: pt will complete adl at supervision  level  OT Frequency: Min 2X/week   Barriers to D/C:            Co-evaluation              AM-PAC PT "6 Clicks" Daily Activity     Outcome Measure Help from another person eating meals?: None Help from another person taking care of personal grooming?: A Little Help from another person toileting, which includes using toliet, bedpan, or urinal?: A Little Help from another person bathing (including washing, rinsing, drying)?: A Little Help from another person to put on and taking off regular upper body clothing?: A Little Help from another person to put on and taking off regular lower body clothing?: A Little 6 Click Score: 19   End of Session    Activity Tolerance: Patient tolerated treatment well Patient left: in chair;with call bell/phone within reach;with chair alarm set  OT Visit Diagnosis: Muscle weakness (generalized) (M62.81)                Time: 1610-9604 OT Time Calculation (min): 22 min Charges:  OT General Charges $OT Visit: 1 Procedure OT Evaluation $OT Eval Moderate Complexity: 1 Procedure G-Codes:     [place], OTR/L [phone] 09/05/2016  Ngan Qualls 09/05/2016, 9:12 AM

## 2016-09-05 NOTE — Care Management Note (Signed)
Case Management Note  Patient Details  Name: Valerie Carr MRN: [id number] Date of Birth: [birth year]  Subjective/Objective:    Shower stool ordered-[facility] dme rep Jermaine brought into rm. [facility]-aware of d/c. No further CM needs.                Action/Plan:d/c home w/HHC/dme.   Expected Discharge Date:  09/05/16               Expected Discharge Plan:  Home w Home Health Services  In-House Referral:     Discharge planning Services  CM Consult  Post Acute Care Choice:    Choice offered to:  Adult Children  DME Arranged:    DME Agency:     HH Arranged:  RN, PT, OT, Nurse's Aide, Social Work [employer]  Status of Service:     If discussed at [employer] of [employer] Meetings, dates discussed:    Additional Comments:  Lanier ClamMahabir, Karlee Staff, RN 09/05/2016, 2:11 PM

## 2016-09-11 DIAGNOSIS — E1121 Type 2 diabetes mellitus with diabetic nephropathy: Secondary | ICD-10-CM | POA: Diagnosis not present

## 2016-09-11 DIAGNOSIS — N179 Acute kidney failure, unspecified: Secondary | ICD-10-CM | POA: Diagnosis not present

## 2016-09-11 DIAGNOSIS — I10 Essential (primary) hypertension: Secondary | ICD-10-CM | POA: Diagnosis not present

## 2016-09-11 DIAGNOSIS — G301 Alzheimer's disease with late onset: Secondary | ICD-10-CM | POA: Diagnosis not present

## 2016-09-11 DIAGNOSIS — D649 Anemia, unspecified: Secondary | ICD-10-CM | POA: Diagnosis not present

## 2016-10-06 ENCOUNTER — Encounter: Payer: Self-pay | Admitting: Podiatry

## 2016-10-06 ENCOUNTER — Ambulatory Visit (INDEPENDENT_AMBULATORY_CARE_PROVIDER_SITE_OTHER): Payer: Medicare Other | Admitting: Podiatry

## 2016-10-06 DIAGNOSIS — E119 Type 2 diabetes mellitus without complications: Secondary | ICD-10-CM

## 2016-10-06 DIAGNOSIS — L608 Other nail disorders: Secondary | ICD-10-CM

## 2016-10-06 NOTE — Progress Notes (Signed)
Patient ID: Valerie Carr, female   DOB: [birth year], 83 y.o.   MRN: [id number]   Subjective: This patient presents today with her daughter present in the treatment room. Her daughter is concerned about the toenails are elongated and slightly thickened over the past several years. Patient or daughter have been trimming the toenails up to this point, however, daughter is reluctant to trim toenails at this time. Patient and daughter deny any history of foot ulceration, claudication or amputation. Patient is a known diabetic multiple years  Objective: Vascular: No peripheral edema noted bilaterally DP and PT pulses 1/4 bilaterally Capillary reflex immediate bilaterally  Musculoskeletal skeletal: HAV deformities bilaterally Bunionette's bilaterally There is no restriction ankle, subtalar, midtarsal joints bilaterally  Neurological: Sensation to 10 g monofilament wire intact 5/5 bilaterally Vibratory sensation reactive bilaterally Ankle reflex equal and reactive bilaterally  Dermatological: The toenails elongated, incurvated with occasional color changes 6-10 No open skin lesions bilaterally No corns/calluses bilaterally  Assessment: Satisfactory neurovascular status Diabetic without foot complications Incurvated toenails 6-10  Plan: Toenails 6-10 are debrided mechanically and electronically without any bleeding. Advised patient and daughter that could present to pedicures for trimming with the following exception: No whirlpool or manipulation of the cuticles  Reappoint when necessary at patient's request or for yearly diabetic foot exam

## 2016-10-06 NOTE — Patient Instructions (Signed)

## 2016-10-23 ENCOUNTER — Encounter: Payer: Medicare Other | Admitting: Psychology

## 2016-11-10 ENCOUNTER — Encounter: Payer: Medicare Other | Admitting: Psychology

## 2016-12-05 ENCOUNTER — Telehealth: Payer: Self-pay | Admitting: Neurology

## 2016-12-05 NOTE — Telephone Encounter (Signed)
PT was called to be reminded of her 11/12 appointment; PT daughter answered & stated she was very upset because nobody is helping her with her mother. She stated that she tried to contact us several times & never received a call back. Pt was told that RN tried to reach her several times. PT daughter was also told that she needs to call the home health agency we referred PT to to get the help she is requesting.

## 2016-12-08 ENCOUNTER — Telehealth: Payer: Self-pay

## 2016-12-08 ENCOUNTER — Ambulatory Visit: Payer: Medicare Other | Admitting: Neurology
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
## 2016-12-08 NOTE — Telephone Encounter (Signed)
See today phone note from today 12/08/2016  from continuation of 12/05/2016.Thanks
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
## 2016-12-08 NOTE — Telephone Encounter (Signed)
Patient no show for appt today. Pt's daughter states at first she was sick. See phone note from today. PT's daughter states she did not see a need for her mom to be seen today.

## 2016-12-08 NOTE — Telephone Encounter (Signed)
Noted. Thank you. I hope pt can be placed [facility] in 01[facility] as daughter wished. She can continue follow up with her PCP for the placement.   Valerie PlanJindong Montray Kliebert, MD PhD Stroke Neurology 12/08/2016 5:18 PM

## 2016-12-08 NOTE — Telephone Encounter (Signed)
Revised. 

## 2016-12-09 ENCOUNTER — Encounter: Payer: Self-pay | Admitting: Neurology

## 2016-12-16 ENCOUNTER — Ambulatory Visit (HOSPITAL_COMMUNITY): Payer: Medicare Other

## 2016-12-31 ENCOUNTER — Ambulatory Visit (HOSPITAL_COMMUNITY)
Admission: RE | Admit: 2016-12-31 | Discharge: 2016-12-31 | Disposition: A | Discharge status: Home / Self Care | Payer: Medicare Other | Source: Ambulatory Visit | Attending: Cardiovascular Disease | Admitting: Cardiovascular Disease

## 2016-12-31 DIAGNOSIS — I6523 Occlusion and stenosis of bilateral carotid arteries: Secondary | ICD-10-CM | POA: Diagnosis not present

## 2017-01-21 ENCOUNTER — Telehealth: Payer: Self-pay

## 2017-01-21 DIAGNOSIS — I6523 Occlusion and stenosis of bilateral carotid arteries: Secondary | ICD-10-CM

## 2017-01-21 NOTE — Telephone Encounter (Signed)
-----   Message from Corky CraftsJayadeep S [name], MD sent at 01/05/2017  4:22 PM EST ----- Moderate carotid disease bilaterally. Repeat study in one year.

## 2017-01-21 NOTE — Telephone Encounter (Signed)
Patient's daughter Valerie MessierKathy [facility](DPR on file) made aware of results. She verbalized understanding and thanked me for the call. Repeat study ordered for 1 year.

## 2017-03-25 ENCOUNTER — Emergency Department (HOSPITAL_COMMUNITY)
Admission: EM | Admit: 2017-03-25 | Discharge: 2017-03-25 | Disposition: A | Discharge status: Home / Self Care | Payer: Medicare Other | Attending: Emergency Medicine | Admitting: Emergency Medicine

## 2017-03-25 ENCOUNTER — Emergency Department (HOSPITAL_COMMUNITY): Payer: Medicare Other

## 2017-03-25 DIAGNOSIS — I10 Essential (primary) hypertension: Secondary | ICD-10-CM | POA: Insufficient documentation

## 2017-03-25 DIAGNOSIS — G309 Alzheimer's disease, unspecified: Secondary | ICD-10-CM | POA: Insufficient documentation

## 2017-03-25 DIAGNOSIS — F0280 Dementia in other diseases classified elsewhere without behavioral disturbance: Secondary | ICD-10-CM | POA: Insufficient documentation

## 2017-03-25 DIAGNOSIS — S51012A Laceration without foreign body of left elbow, initial encounter: Secondary | ICD-10-CM | POA: Diagnosis not present

## 2017-03-25 DIAGNOSIS — I2510 Atherosclerotic heart disease of native coronary artery without angina pectoris: Secondary | ICD-10-CM | POA: Insufficient documentation

## 2017-03-25 DIAGNOSIS — T148XXA Other injury of unspecified body region, initial encounter: Secondary | ICD-10-CM | POA: Diagnosis not present

## 2017-03-25 DIAGNOSIS — Z7984 Long term (current) use of oral hypoglycemic drugs: Secondary | ICD-10-CM | POA: Diagnosis not present

## 2017-03-25 DIAGNOSIS — S51812A Laceration without foreign body of left forearm, initial encounter: Secondary | ICD-10-CM | POA: Diagnosis not present

## 2017-03-25 DIAGNOSIS — Y939 Activity, unspecified: Secondary | ICD-10-CM | POA: Diagnosis not present

## 2017-03-25 DIAGNOSIS — Z79899 Other long term (current) drug therapy: Secondary | ICD-10-CM | POA: Diagnosis not present

## 2017-03-25 DIAGNOSIS — W19XXXA Unspecified fall, initial encounter: Secondary | ICD-10-CM | POA: Insufficient documentation

## 2017-03-25 DIAGNOSIS — E119 Type 2 diabetes mellitus without complications: Secondary | ICD-10-CM | POA: Insufficient documentation

## 2017-03-25 DIAGNOSIS — Y92129 Unspecified place in nursing home as the place of occurrence of the external cause: Secondary | ICD-10-CM | POA: Diagnosis not present

## 2017-03-25 DIAGNOSIS — S59902A Unspecified injury of left elbow, initial encounter: Secondary | ICD-10-CM | POA: Diagnosis not present

## 2017-03-25 DIAGNOSIS — S41112A Laceration without foreign body of left upper arm, initial encounter: Secondary | ICD-10-CM

## 2017-03-25 DIAGNOSIS — Y999 Unspecified external cause status: Secondary | ICD-10-CM | POA: Diagnosis not present

## 2017-03-25 DIAGNOSIS — S61409A Unspecified open wound of unspecified hand, initial encounter: Secondary | ICD-10-CM | POA: Diagnosis not present

## 2017-03-25 MED ORDER — LIDOCAINE-EPINEPHRINE (PF) 2 %-1:200000 IJ SOLN
20.0000 mL | Freq: Once | INTRAMUSCULAR | Status: AC
  Administered 2017-03-25: 20 mL
  Filled 2017-03-25: qty 20

## 2017-03-25 NOTE — ED Notes (Signed)
Bed: [id number] Expected date:  Expected time:  Means of arrival:  Comments: 84 yo fall, ambulatory need stitches?

## 2017-03-25 NOTE — ED Triage Notes (Signed)
[facility]--unwitnessed fall today. Laceration to left elbow. Alert and oriented x 3 per baseline. VSS with EMS.

## 2017-03-25 NOTE — Discharge Instructions (Signed)
Keep initial dressing on until Saturday morning.  Then you can change the dressing daily.  Sutures out in approximately 10 days.  Return for any signs of infection.

## 2017-03-25 NOTE — ED Notes (Signed)
Lac tray, saline, stool, gloves, lidocaine, and suture cart at bedside.

## 2017-03-25 NOTE — ED Provider Notes (Signed)
[facility]-EMERGENCY DEPT Provider Note   CSN: [id number] Arrival date & time: 03/25/17  1829     History   Chief Complaint Chief Complaint  Patient presents with  . Fall    HPI Valerie Carr is a 84 y.o. female.  Level 5 caveat for dementia.  Patient had an accidental trip and fall today striking her left proximal forearm.  No obvious head or neck trauma.  No additional extremity pain.  Family reports normal behavior.  No prodromal illnesses.      Past Medical History:  Diagnosis Date  . Arteriosclerotic cardiovascular disease (ASCVD)   . Chronic anxiety    and depression  . Dementia   . DM type 2 (diabetes mellitus, type 2) (HCC)   . Gallstones    asysmptomatic  . HTN (hypertension)   . Hyperlipidemia   . Sick sinus syndrome [facility])    s/p PPM    Patient Active Problem List   Diagnosis Date Noted  . Dementia with behavioral disturbance   . Type 2 diabetes mellitus without complication, without long-term current use of insulin (HCC)   . Acute lower UTI 09/04/2016  . Acute cystitis without hematuria   . AKI (acute kidney injury) (HCC) 09/03/2016  . SBO (small bowel obstruction) (HCC) 04/18/2016  . Chronic atrial fibrillation (HCC) 05/31/2014  . Cardiac pacemaker in situ 05/31/2014  . Coronary artery disease involving coronary bypass graft of native heart without angina pectoris 05/31/2014  . Carotid artery stenosis, asymptomatic 05/31/2014  . Sick sinus syndrome (HCC) 04/18/2013  . Coronary atherosclerosis of native coronary artery 01/11/2013  . Essential hypertension, benign 01/11/2013  . Mixed hyperlipidemia 01/11/2013  . Alzheimer's disease 09/02/2012  . Bradycardia 01/06/2011    Past Surgical History:  Procedure Laterality Date  . APPENDECTOMY    . CORONARY ARTERY BYPASS GRAFT     07-11-2005, triple bypass  . ESOPHAGOGASTRODUODENOSCOPY    . LAPAROTOMY N/A 04/20/2016   Procedure: EXPLORATORY LAPAROTOMY, SMALL BOWEL RESECTION,  LYSIS OF ADHESIONS;  Surgeon: Darnell Level, MD;  Location: [facility];  Service: General;  Laterality: N/A;  . PACEMAKER INSERTION  2004   [employer] PPM implanted by Dr Amil Amen, generator change by Dr Johney Frame 01/07/11  . PERMANENT PACEMAKER GENERATOR CHANGE N/A 01/07/2011   Procedure: PERMANENT PACEMAKER GENERATOR CHANGE;  Surgeon: Hillis Range, MD;  Location: [facility] CATH LAB;  Service: Cardiovascular;  Laterality: N/A;  . TONSILLECTOMY    . TOTAL ABDOMINAL HYSTERECTOMY W/ BILATERAL SALPINGOOPHORECTOMY      OB History    No data available       Home Medications    Prior to Admission medications   Medication Sig Start Date End Date Taking? Authorizing Provider  donepezil (ARICEPT) 10 MG tablet TAKE 1 TABLET (10 MG TOTAL) BY MOUTH AT BEDTIME. 05/20/16  Yes Marvel Plan, MD  ONE [facility] ULTRA TEST test strip TEST DAILY DX E11.51 06/19/14  Yes [provider]  DULoxetine (CYMBALTA) 60 MG capsule Take 60 mg by mouth daily.     [provider]  memantine (NAMENDA) 10 MG tablet Take 1 tablet (10 mg total) by mouth 2 (two) times daily. 01/11/13   Ronal Fear, NP  metFORMIN (GLUCOPHAGE-XR) 500 MG 24 hr tablet Take 500 mg by mouth daily with breakfast.      [provider]  omeprazole (PRILOSEC) 40 MG capsule Take 40 mg by mouth daily. 02/28/15   [provider]  QUEtiapine (SEROQUEL) 25 MG tablet Take 1 tablet (25 mg  total) by mouth every morning. 11/08/15   Marvel PlanXu, Jindong, MD  rosuvastatin (CRESTOR) 20 MG tablet Take 20 mg by mouth daily. 03/21/16   [provider]  traZODone (DESYREL) 100 MG tablet Take 100 mg by mouth at bedtime.      [provider]  valsartan (DIOVAN) 160 MG tablet Take 160 mg by mouth daily.    [provider]    Family History Family History  Problem Relation Age of Onset  . Diabetes Mother   . Hypertension Mother   . Coronary artery disease Father   . Diabetes Brother   . Throat cancer Brother   . Diabetes  Brother   . Hypertension Sister   . Cancer Son        esophageal    Social History Social History   Tobacco Use  . Smoking status: Former Smoker    Packs/day: 1.00    Years: 25.00    Pack years: 25.00    Last attempt to quit: 2001    Years since quitting: 18.1  . Smokeless tobacco: Former Engineer, waterUser  Substance Use Topics  . Alcohol use: No  . Drug use: No     Allergies   Clarithromycin; Codeine phosphate; Effexor [venlafaxine hydrochloride]; Fexofenadine hcl; Lisinopril; Prozac [fluoxetine hcl]; Septra [bactrim]; and Wellbutrin [bupropion hcl]   Review of Systems Review of Systems  Unable to perform ROS: Dementia     Physical Exam Updated Vital Signs BP (!) 141/75 (BP Location: Right Arm)   Pulse 71   Temp 97.9 F (36.6 C) (Oral)   Resp 18   SpO2 100%   Physical Exam  Constitutional: She appears well-developed and well-nourished.  No acute distress  HENT:  Head: Normocephalic and atraumatic.  Eyes: Conjunctivae are normal.  Neck: Neck supple.  Cardiovascular: Normal rate and regular rhythm.  Pulmonary/Chest: Effort normal and breath sounds normal.  Abdominal: Soft. Bowel sounds are normal.  Musculoskeletal: Normal range of motion.  Neurological: She is alert.  Skin: Skin is warm and dry.  4.0 cm V-shaped laceration on proximal posterior aspect of left forearm  Psychiatric:  Pleasant  Nursing note and vitals reviewed.    ED Treatments / Results  Labs (all labs ordered are listed, but only abnormal results are displayed) Labs Reviewed - No data to display  EKG  EKG Interpretation None       Radiology Dg Elbow Complete Left  Result Date: 03/25/2017 CLINICAL DATA:  Fall with injury to the elbow EXAM: LEFT ELBOW - COMPLETE 3+ VIEW COMPARISON:  None. FINDINGS: No definite acute displaced fracture or malalignment is seen. No significant elbow effusion. Lucency over the medial epicondyle is felt related to skin fold artifact IMPRESSION: No definite acute  osseous abnormality Electronically Signed   By: Jasmine PangKim  Fujinaga M.D.   On: 03/25/2017 20:28    Procedures .Marland Kitchen.Laceration Repair Date/Time: 03/25/2017 9:00 PM Performed by: Donnetta Hutchingook, Hadiyah Maricle, MD Authorized by: Donnetta Hutchingook, Zunaira Lamy, MD   Consent:    Consent obtained:  Verbal   Consent given by:  Guardian   Risks discussed:  Pain and poor wound healing Anesthesia (see MAR for exact dosages):    Anesthesia method:  Local infiltration   Local anesthetic:  Lidocaine 1% WITH epi Laceration details:    Location:  Shoulder/arm   Shoulder/arm location:  L elbow   Wound length (cm): 4.0.   Laceration depth: 1.0. Repair type:    Repair type:  Simple Pre-procedure details:    Preparation:  Patient was prepped and draped in  usual sterile fashion Exploration:    Hemostasis achieved with:  Direct pressure   Wound exploration: wound explored through full range of motion     Contaminated: no   Treatment:    Area cleansed with:  Saline   Amount of cleaning:  Extensive   Irrigation solution:  Sterile saline   Irrigation method:  Syringe   Visualized foreign bodies/material removed: no   Skin repair:    Repair method:  Sutures   Suture size:  3-0   Suture material:  Nylon   Suture technique:  Simple interrupted   Number of sutures: 4. Approximation:    Approximation:  Loose Post-procedure details:    Dressing:  Sterile dressing   Patient tolerance of procedure:  Tolerated well, no immediate complications Comments:     Patient tolerated procedure well.   (including critical care time)  Medications Ordered in ED Medications  lidocaine-EPINEPHrine (XYLOCAINE W/EPI) 2 %-1:200000 (PF) injection 20 mL (20 mLs Infiltration Given 03/25/17 1935)     Initial Impression / Assessment and Plan / ED Course  I have reviewed the triage vital signs and the nursing notes.  Pertinent labs & imaging results that were available during my care of the patient were reviewed by me and considered in my medical decision  making (see chart for details).     Status post accidental mechanical fall resulting in laceration to proximal left forearm.  Laceration repaired by examiner.  Patient tolerated procedure well.  No other obvious head or neck injuries.  Final Clinical Impressions(s) / ED Diagnoses   Final diagnoses:  Fall, initial encounter  Laceration of left upper extremity, initial encounter    ED Discharge Orders    None       Donnetta Hutching, MD 03/25/17 2158

## 2017-03-31 ENCOUNTER — Encounter (HOSPITAL_COMMUNITY): Payer: Self-pay | Admitting: Emergency Medicine

## 2017-03-31 ENCOUNTER — Emergency Department (HOSPITAL_COMMUNITY)
Admission: EM | Admit: 2017-03-31 | Discharge: 2017-03-31 | Disposition: A | Discharge status: Home / Self Care | Payer: Medicare Other | Attending: Emergency Medicine | Admitting: Emergency Medicine

## 2017-03-31 DIAGNOSIS — L03114 Cellulitis of left upper limb: Secondary | ICD-10-CM | POA: Diagnosis not present

## 2017-03-31 DIAGNOSIS — Z87891 Personal history of nicotine dependence: Secondary | ICD-10-CM | POA: Diagnosis not present

## 2017-03-31 DIAGNOSIS — Z95811 Presence of heart assist device: Secondary | ICD-10-CM | POA: Insufficient documentation

## 2017-03-31 DIAGNOSIS — S51012D Laceration without foreign body of left elbow, subsequent encounter: Secondary | ICD-10-CM | POA: Diagnosis present

## 2017-03-31 DIAGNOSIS — I4891 Unspecified atrial fibrillation: Secondary | ICD-10-CM | POA: Diagnosis not present

## 2017-03-31 DIAGNOSIS — E119 Type 2 diabetes mellitus without complications: Secondary | ICD-10-CM | POA: Diagnosis not present

## 2017-03-31 DIAGNOSIS — W1830XD Fall on same level, unspecified, subsequent encounter: Secondary | ICD-10-CM | POA: Insufficient documentation

## 2017-03-31 DIAGNOSIS — I2510 Atherosclerotic heart disease of native coronary artery without angina pectoris: Secondary | ICD-10-CM | POA: Insufficient documentation

## 2017-03-31 DIAGNOSIS — Z7984 Long term (current) use of oral hypoglycemic drugs: Secondary | ICD-10-CM | POA: Insufficient documentation

## 2017-03-31 DIAGNOSIS — Z79899 Other long term (current) drug therapy: Secondary | ICD-10-CM | POA: Diagnosis not present

## 2017-03-31 DIAGNOSIS — I10 Essential (primary) hypertension: Secondary | ICD-10-CM | POA: Diagnosis not present

## 2017-03-31 DIAGNOSIS — F0390 Unspecified dementia without behavioral disturbance: Secondary | ICD-10-CM | POA: Insufficient documentation

## 2017-03-31 MED ORDER — CLINDAMYCIN HCL 300 MG PO CAPS: 300.0000 mg | ORAL_CAPSULE | Freq: Three times a day (TID) | ORAL | 0 refills | Status: DC

## 2017-03-31 MED ORDER — CLINDAMYCIN HCL 300 MG PO CAPS
300.0000 mg | ORAL_CAPSULE | Freq: Once | ORAL | Status: AC
  Administered 2017-03-31: 300 mg via ORAL
  Filled 2017-03-31: qty 1

## 2017-03-31 NOTE — Progress Notes (Signed)
CSW received consult. CSW located at [facility], pt was at [facility]. CSW discussed case with EDP. Pt signed out AMA.   For future encounters: Pt lives alone. Pt's daughter is POA. Pt is not doing well living alone, multiple falls, clutter apartment.  According to daughter, pt is not properly taking her medication.  Montine CircleKelsy Bibiana Gillean, Silverio LayLCSWA [facility]  [phone]

## 2017-03-31 NOTE — ED Notes (Signed)
Bed: WA03 Expected date:  Expected time:  Means of arrival:  Comments: Valerie Carr

## 2017-03-31 NOTE — ED Notes (Signed)
Pt was told that it would be AMA if she decided to leave without blood work or proper assessment from the doctor and was asked to stay. Pt refused and said "she wanted to go home and she couldn't stay" This nurse talked with daughter and tried to get her to comply with staying but they decided to go anyway. Discharge papers were explained with prescriptions.
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
## 2017-03-31 NOTE — ED Triage Notes (Signed)
Patient BIB daughter, reports redness, heat and swelling to sutured wound on right arm from fall on Thursday. Hx dementia and diabetes. [facility]

## 2017-04-16 DIAGNOSIS — D509 Iron deficiency anemia, unspecified: Secondary | ICD-10-CM | POA: Diagnosis not present

## 2017-04-16 DIAGNOSIS — F0390 Unspecified dementia without behavioral disturbance: Secondary | ICD-10-CM | POA: Diagnosis not present

## 2017-04-16 DIAGNOSIS — F331 Major depressive disorder, recurrent, moderate: Secondary | ICD-10-CM | POA: Diagnosis not present

## 2017-04-16 DIAGNOSIS — E1151 Type 2 diabetes mellitus with diabetic peripheral angiopathy without gangrene: Secondary | ICD-10-CM | POA: Diagnosis not present

## 2017-04-16 DIAGNOSIS — E785 Hyperlipidemia, unspecified: Secondary | ICD-10-CM | POA: Diagnosis not present

## 2017-04-16 DIAGNOSIS — I129 Hypertensive chronic kidney disease with stage 1 through stage 4 chronic kidney disease, or unspecified chronic kidney disease: Secondary | ICD-10-CM | POA: Diagnosis not present

## 2017-04-16 DIAGNOSIS — E440 Moderate protein-calorie malnutrition: Secondary | ICD-10-CM | POA: Diagnosis not present

## 2017-04-16 DIAGNOSIS — E1121 Type 2 diabetes mellitus with diabetic nephropathy: Secondary | ICD-10-CM | POA: Diagnosis not present

## 2017-06-29 DIAGNOSIS — E785 Hyperlipidemia, unspecified: Secondary | ICD-10-CM | POA: Diagnosis not present

## 2017-06-29 DIAGNOSIS — M6281 Muscle weakness (generalized): Secondary | ICD-10-CM | POA: Diagnosis not present

## 2017-06-29 DIAGNOSIS — F331 Major depressive disorder, recurrent, moderate: Secondary | ICD-10-CM | POA: Diagnosis not present

## 2017-06-29 DIAGNOSIS — G309 Alzheimer's disease, unspecified: Secondary | ICD-10-CM | POA: Diagnosis not present

## 2017-06-29 DIAGNOSIS — I10 Essential (primary) hypertension: Secondary | ICD-10-CM | POA: Diagnosis not present

## 2017-06-29 DIAGNOSIS — Z8673 Personal history of transient ischemic attack (TIA), and cerebral infarction without residual deficits: Secondary | ICD-10-CM | POA: Diagnosis not present

## 2017-06-29 DIAGNOSIS — R278 Other lack of coordination: Secondary | ICD-10-CM | POA: Diagnosis not present

## 2017-06-29 DIAGNOSIS — R279 Unspecified lack of coordination: Secondary | ICD-10-CM | POA: Diagnosis not present

## 2017-06-30 DIAGNOSIS — G309 Alzheimer's disease, unspecified: Secondary | ICD-10-CM | POA: Diagnosis not present

## 2017-06-30 DIAGNOSIS — M6281 Muscle weakness (generalized): Secondary | ICD-10-CM | POA: Diagnosis not present

## 2017-06-30 DIAGNOSIS — F331 Major depressive disorder, recurrent, moderate: Secondary | ICD-10-CM | POA: Diagnosis not present

## 2017-06-30 DIAGNOSIS — E785 Hyperlipidemia, unspecified: Secondary | ICD-10-CM | POA: Diagnosis not present

## 2017-06-30 DIAGNOSIS — I10 Essential (primary) hypertension: Secondary | ICD-10-CM | POA: Diagnosis not present

## 2017-06-30 DIAGNOSIS — Z8673 Personal history of transient ischemic attack (TIA), and cerebral infarction without residual deficits: Secondary | ICD-10-CM | POA: Diagnosis not present

## 2017-06-30 DIAGNOSIS — R279 Unspecified lack of coordination: Secondary | ICD-10-CM | POA: Diagnosis not present

## 2017-06-30 DIAGNOSIS — R278 Other lack of coordination: Secondary | ICD-10-CM | POA: Diagnosis not present

## 2017-07-01 DIAGNOSIS — M6281 Muscle weakness (generalized): Secondary | ICD-10-CM | POA: Diagnosis not present

## 2017-07-01 DIAGNOSIS — R279 Unspecified lack of coordination: Secondary | ICD-10-CM | POA: Diagnosis not present

## 2017-07-01 DIAGNOSIS — Z8673 Personal history of transient ischemic attack (TIA), and cerebral infarction without residual deficits: Secondary | ICD-10-CM | POA: Diagnosis not present

## 2017-07-01 DIAGNOSIS — G309 Alzheimer's disease, unspecified: Secondary | ICD-10-CM | POA: Diagnosis not present

## 2017-07-01 DIAGNOSIS — R278 Other lack of coordination: Secondary | ICD-10-CM | POA: Diagnosis not present

## 2017-07-02 DIAGNOSIS — M6281 Muscle weakness (generalized): Secondary | ICD-10-CM | POA: Diagnosis not present

## 2017-07-02 DIAGNOSIS — R278 Other lack of coordination: Secondary | ICD-10-CM | POA: Diagnosis not present

## 2017-07-02 DIAGNOSIS — R279 Unspecified lack of coordination: Secondary | ICD-10-CM | POA: Diagnosis not present

## 2017-07-02 DIAGNOSIS — G309 Alzheimer's disease, unspecified: Secondary | ICD-10-CM | POA: Diagnosis not present

## 2017-07-02 DIAGNOSIS — Z8673 Personal history of transient ischemic attack (TIA), and cerebral infarction without residual deficits: Secondary | ICD-10-CM | POA: Diagnosis not present

## 2017-07-03 DIAGNOSIS — M6281 Muscle weakness (generalized): Secondary | ICD-10-CM | POA: Diagnosis not present

## 2017-07-03 DIAGNOSIS — G309 Alzheimer's disease, unspecified: Secondary | ICD-10-CM | POA: Diagnosis not present

## 2017-07-03 DIAGNOSIS — R279 Unspecified lack of coordination: Secondary | ICD-10-CM | POA: Diagnosis not present

## 2017-07-03 DIAGNOSIS — R278 Other lack of coordination: Secondary | ICD-10-CM | POA: Diagnosis not present

## 2017-07-03 DIAGNOSIS — Z8673 Personal history of transient ischemic attack (TIA), and cerebral infarction without residual deficits: Secondary | ICD-10-CM | POA: Diagnosis not present

## 2017-07-04 DIAGNOSIS — G309 Alzheimer's disease, unspecified: Secondary | ICD-10-CM | POA: Diagnosis not present

## 2017-07-04 DIAGNOSIS — M6281 Muscle weakness (generalized): Secondary | ICD-10-CM | POA: Diagnosis not present

## 2017-07-04 DIAGNOSIS — R278 Other lack of coordination: Secondary | ICD-10-CM | POA: Diagnosis not present

## 2017-07-04 DIAGNOSIS — R279 Unspecified lack of coordination: Secondary | ICD-10-CM | POA: Diagnosis not present

## 2017-07-04 DIAGNOSIS — Z8673 Personal history of transient ischemic attack (TIA), and cerebral infarction without residual deficits: Secondary | ICD-10-CM | POA: Diagnosis not present

## 2017-07-06 DIAGNOSIS — R278 Other lack of coordination: Secondary | ICD-10-CM | POA: Diagnosis not present

## 2017-07-06 DIAGNOSIS — Z8673 Personal history of transient ischemic attack (TIA), and cerebral infarction without residual deficits: Secondary | ICD-10-CM | POA: Diagnosis not present

## 2017-07-06 DIAGNOSIS — M6281 Muscle weakness (generalized): Secondary | ICD-10-CM | POA: Diagnosis not present

## 2017-07-06 DIAGNOSIS — R279 Unspecified lack of coordination: Secondary | ICD-10-CM | POA: Diagnosis not present

## 2017-07-06 DIAGNOSIS — G309 Alzheimer's disease, unspecified: Secondary | ICD-10-CM | POA: Diagnosis not present

## 2017-07-07 DIAGNOSIS — R279 Unspecified lack of coordination: Secondary | ICD-10-CM | POA: Diagnosis not present

## 2017-07-07 DIAGNOSIS — R278 Other lack of coordination: Secondary | ICD-10-CM | POA: Diagnosis not present

## 2017-07-07 DIAGNOSIS — Z8673 Personal history of transient ischemic attack (TIA), and cerebral infarction without residual deficits: Secondary | ICD-10-CM | POA: Diagnosis not present

## 2017-07-07 DIAGNOSIS — G309 Alzheimer's disease, unspecified: Secondary | ICD-10-CM | POA: Diagnosis not present

## 2017-07-07 DIAGNOSIS — M6281 Muscle weakness (generalized): Secondary | ICD-10-CM | POA: Diagnosis not present

## 2017-07-08 DIAGNOSIS — R278 Other lack of coordination: Secondary | ICD-10-CM | POA: Diagnosis not present

## 2017-07-08 DIAGNOSIS — R279 Unspecified lack of coordination: Secondary | ICD-10-CM | POA: Diagnosis not present

## 2017-07-08 DIAGNOSIS — G309 Alzheimer's disease, unspecified: Secondary | ICD-10-CM | POA: Diagnosis not present

## 2017-07-08 DIAGNOSIS — M6281 Muscle weakness (generalized): Secondary | ICD-10-CM | POA: Diagnosis not present

## 2017-07-08 DIAGNOSIS — Z8673 Personal history of transient ischemic attack (TIA), and cerebral infarction without residual deficits: Secondary | ICD-10-CM | POA: Diagnosis not present

## 2017-07-09 DIAGNOSIS — R279 Unspecified lack of coordination: Secondary | ICD-10-CM | POA: Diagnosis not present

## 2017-07-09 DIAGNOSIS — Z8673 Personal history of transient ischemic attack (TIA), and cerebral infarction without residual deficits: Secondary | ICD-10-CM | POA: Diagnosis not present

## 2017-07-09 DIAGNOSIS — M6281 Muscle weakness (generalized): Secondary | ICD-10-CM | POA: Diagnosis not present

## 2017-07-09 DIAGNOSIS — G309 Alzheimer's disease, unspecified: Secondary | ICD-10-CM | POA: Diagnosis not present

## 2017-07-09 DIAGNOSIS — R278 Other lack of coordination: Secondary | ICD-10-CM | POA: Diagnosis not present

## 2017-07-10 DIAGNOSIS — Z8673 Personal history of transient ischemic attack (TIA), and cerebral infarction without residual deficits: Secondary | ICD-10-CM | POA: Diagnosis not present

## 2017-07-10 DIAGNOSIS — R279 Unspecified lack of coordination: Secondary | ICD-10-CM | POA: Diagnosis not present

## 2017-07-10 DIAGNOSIS — G309 Alzheimer's disease, unspecified: Secondary | ICD-10-CM | POA: Diagnosis not present

## 2017-07-10 DIAGNOSIS — M6281 Muscle weakness (generalized): Secondary | ICD-10-CM | POA: Diagnosis not present

## 2017-07-10 DIAGNOSIS — R278 Other lack of coordination: Secondary | ICD-10-CM | POA: Diagnosis not present

## 2017-07-13 DIAGNOSIS — M6281 Muscle weakness (generalized): Secondary | ICD-10-CM | POA: Diagnosis not present

## 2017-07-13 DIAGNOSIS — G309 Alzheimer's disease, unspecified: Secondary | ICD-10-CM | POA: Diagnosis not present

## 2017-07-13 DIAGNOSIS — Z8673 Personal history of transient ischemic attack (TIA), and cerebral infarction without residual deficits: Secondary | ICD-10-CM | POA: Diagnosis not present

## 2017-07-13 DIAGNOSIS — R279 Unspecified lack of coordination: Secondary | ICD-10-CM | POA: Diagnosis not present

## 2017-07-13 DIAGNOSIS — R278 Other lack of coordination: Secondary | ICD-10-CM | POA: Diagnosis not present

## 2017-07-14 DIAGNOSIS — G309 Alzheimer's disease, unspecified: Secondary | ICD-10-CM | POA: Diagnosis not present

## 2017-07-14 DIAGNOSIS — R279 Unspecified lack of coordination: Secondary | ICD-10-CM | POA: Diagnosis not present

## 2017-07-14 DIAGNOSIS — Z8673 Personal history of transient ischemic attack (TIA), and cerebral infarction without residual deficits: Secondary | ICD-10-CM | POA: Diagnosis not present

## 2017-07-14 DIAGNOSIS — M6281 Muscle weakness (generalized): Secondary | ICD-10-CM | POA: Diagnosis not present

## 2017-07-14 DIAGNOSIS — R278 Other lack of coordination: Secondary | ICD-10-CM | POA: Diagnosis not present

## 2017-07-15 DIAGNOSIS — G309 Alzheimer's disease, unspecified: Secondary | ICD-10-CM | POA: Diagnosis not present

## 2017-07-15 DIAGNOSIS — R278 Other lack of coordination: Secondary | ICD-10-CM | POA: Diagnosis not present

## 2017-07-15 DIAGNOSIS — M6281 Muscle weakness (generalized): Secondary | ICD-10-CM | POA: Diagnosis not present

## 2017-07-15 DIAGNOSIS — R279 Unspecified lack of coordination: Secondary | ICD-10-CM | POA: Diagnosis not present

## 2017-07-15 DIAGNOSIS — Z8673 Personal history of transient ischemic attack (TIA), and cerebral infarction without residual deficits: Secondary | ICD-10-CM | POA: Diagnosis not present

## 2017-07-16 DIAGNOSIS — Z8673 Personal history of transient ischemic attack (TIA), and cerebral infarction without residual deficits: Secondary | ICD-10-CM | POA: Diagnosis not present

## 2017-07-16 DIAGNOSIS — D518 Other vitamin B12 deficiency anemias: Secondary | ICD-10-CM | POA: Diagnosis not present

## 2017-07-16 DIAGNOSIS — E7800 Pure hypercholesterolemia, unspecified: Secondary | ICD-10-CM | POA: Diagnosis not present

## 2017-07-16 DIAGNOSIS — G309 Alzheimer's disease, unspecified: Secondary | ICD-10-CM | POA: Diagnosis not present

## 2017-07-16 DIAGNOSIS — M6281 Muscle weakness (generalized): Secondary | ICD-10-CM | POA: Diagnosis not present

## 2017-07-16 DIAGNOSIS — R278 Other lack of coordination: Secondary | ICD-10-CM | POA: Diagnosis not present

## 2017-07-16 DIAGNOSIS — D649 Anemia, unspecified: Secondary | ICD-10-CM | POA: Diagnosis not present

## 2017-07-16 DIAGNOSIS — Z79899 Other long term (current) drug therapy: Secondary | ICD-10-CM | POA: Diagnosis not present

## 2017-07-16 DIAGNOSIS — E782 Mixed hyperlipidemia: Secondary | ICD-10-CM | POA: Diagnosis not present

## 2017-07-16 DIAGNOSIS — E559 Vitamin D deficiency, unspecified: Secondary | ICD-10-CM | POA: Diagnosis not present

## 2017-07-16 DIAGNOSIS — R279 Unspecified lack of coordination: Secondary | ICD-10-CM | POA: Diagnosis not present

## 2017-07-16 DIAGNOSIS — E039 Hypothyroidism, unspecified: Secondary | ICD-10-CM | POA: Diagnosis not present

## 2017-07-16 DIAGNOSIS — E119 Type 2 diabetes mellitus without complications: Secondary | ICD-10-CM | POA: Diagnosis not present

## 2017-07-17 DIAGNOSIS — M6281 Muscle weakness (generalized): Secondary | ICD-10-CM | POA: Diagnosis not present

## 2017-07-17 DIAGNOSIS — R278 Other lack of coordination: Secondary | ICD-10-CM | POA: Diagnosis not present

## 2017-07-17 DIAGNOSIS — G309 Alzheimer's disease, unspecified: Secondary | ICD-10-CM | POA: Diagnosis not present

## 2017-07-17 DIAGNOSIS — B351 Tinea unguium: Secondary | ICD-10-CM | POA: Diagnosis not present

## 2017-07-17 DIAGNOSIS — R279 Unspecified lack of coordination: Secondary | ICD-10-CM | POA: Diagnosis not present

## 2017-07-17 DIAGNOSIS — R262 Difficulty in walking, not elsewhere classified: Secondary | ICD-10-CM | POA: Diagnosis not present

## 2017-07-17 DIAGNOSIS — I739 Peripheral vascular disease, unspecified: Secondary | ICD-10-CM | POA: Diagnosis not present

## 2017-07-17 DIAGNOSIS — Z8673 Personal history of transient ischemic attack (TIA), and cerebral infarction without residual deficits: Secondary | ICD-10-CM | POA: Diagnosis not present

## 2017-07-21 DIAGNOSIS — M6281 Muscle weakness (generalized): Secondary | ICD-10-CM | POA: Diagnosis not present

## 2017-07-21 DIAGNOSIS — R278 Other lack of coordination: Secondary | ICD-10-CM | POA: Diagnosis not present

## 2017-07-21 DIAGNOSIS — G309 Alzheimer's disease, unspecified: Secondary | ICD-10-CM | POA: Diagnosis not present

## 2017-07-21 DIAGNOSIS — Z8673 Personal history of transient ischemic attack (TIA), and cerebral infarction without residual deficits: Secondary | ICD-10-CM | POA: Diagnosis not present

## 2017-07-21 DIAGNOSIS — R279 Unspecified lack of coordination: Secondary | ICD-10-CM | POA: Diagnosis not present

## 2017-07-22 DIAGNOSIS — R279 Unspecified lack of coordination: Secondary | ICD-10-CM | POA: Diagnosis not present

## 2017-07-22 DIAGNOSIS — G309 Alzheimer's disease, unspecified: Secondary | ICD-10-CM | POA: Diagnosis not present

## 2017-07-22 DIAGNOSIS — R278 Other lack of coordination: Secondary | ICD-10-CM | POA: Diagnosis not present

## 2017-07-22 DIAGNOSIS — Z8673 Personal history of transient ischemic attack (TIA), and cerebral infarction without residual deficits: Secondary | ICD-10-CM | POA: Diagnosis not present

## 2017-07-22 DIAGNOSIS — M6281 Muscle weakness (generalized): Secondary | ICD-10-CM | POA: Diagnosis not present

## 2017-07-23 DIAGNOSIS — M6281 Muscle weakness (generalized): Secondary | ICD-10-CM | POA: Diagnosis not present

## 2017-07-23 DIAGNOSIS — R279 Unspecified lack of coordination: Secondary | ICD-10-CM | POA: Diagnosis not present

## 2017-07-23 DIAGNOSIS — R278 Other lack of coordination: Secondary | ICD-10-CM | POA: Diagnosis not present

## 2017-07-23 DIAGNOSIS — Z8673 Personal history of transient ischemic attack (TIA), and cerebral infarction without residual deficits: Secondary | ICD-10-CM | POA: Diagnosis not present

## 2017-07-23 DIAGNOSIS — G309 Alzheimer's disease, unspecified: Secondary | ICD-10-CM | POA: Diagnosis not present

## 2017-07-30 DIAGNOSIS — I10 Essential (primary) hypertension: Secondary | ICD-10-CM | POA: Diagnosis not present

## 2017-07-30 DIAGNOSIS — R269 Unspecified abnormalities of gait and mobility: Secondary | ICD-10-CM | POA: Diagnosis not present

## 2017-07-30 DIAGNOSIS — G309 Alzheimer's disease, unspecified: Secondary | ICD-10-CM | POA: Diagnosis not present

## 2017-07-30 DIAGNOSIS — G4700 Insomnia, unspecified: Secondary | ICD-10-CM | POA: Diagnosis not present

## 2017-08-06 DIAGNOSIS — I10 Essential (primary) hypertension: Secondary | ICD-10-CM | POA: Diagnosis not present

## 2017-08-06 DIAGNOSIS — G309 Alzheimer's disease, unspecified: Secondary | ICD-10-CM | POA: Diagnosis not present

## 2017-08-06 DIAGNOSIS — G4700 Insomnia, unspecified: Secondary | ICD-10-CM | POA: Diagnosis not present

## 2017-08-06 DIAGNOSIS — R269 Unspecified abnormalities of gait and mobility: Secondary | ICD-10-CM | POA: Diagnosis not present

## 2017-08-10 DIAGNOSIS — D649 Anemia, unspecified: Secondary | ICD-10-CM | POA: Diagnosis not present

## 2017-08-10 DIAGNOSIS — Z79899 Other long term (current) drug therapy: Secondary | ICD-10-CM | POA: Diagnosis not present

## 2017-08-12 DIAGNOSIS — I10 Essential (primary) hypertension: Secondary | ICD-10-CM | POA: Diagnosis not present

## 2017-08-12 DIAGNOSIS — E119 Type 2 diabetes mellitus without complications: Secondary | ICD-10-CM | POA: Diagnosis not present

## 2017-08-12 DIAGNOSIS — E785 Hyperlipidemia, unspecified: Secondary | ICD-10-CM | POA: Diagnosis not present

## 2017-08-12 DIAGNOSIS — G309 Alzheimer's disease, unspecified: Secondary | ICD-10-CM | POA: Diagnosis not present

## 2017-08-21 DIAGNOSIS — I10 Essential (primary) hypertension: Secondary | ICD-10-CM | POA: Diagnosis not present

## 2017-08-21 DIAGNOSIS — F419 Anxiety disorder, unspecified: Secondary | ICD-10-CM | POA: Diagnosis not present

## 2017-08-21 DIAGNOSIS — F39 Unspecified mood [affective] disorder: Secondary | ICD-10-CM | POA: Diagnosis not present

## 2017-08-21 DIAGNOSIS — E119 Type 2 diabetes mellitus without complications: Secondary | ICD-10-CM | POA: Diagnosis not present

## 2017-08-27 DIAGNOSIS — W19XXXA Unspecified fall, initial encounter: Secondary | ICD-10-CM | POA: Diagnosis not present

## 2017-08-27 DIAGNOSIS — M79645 Pain in left finger(s): Secondary | ICD-10-CM | POA: Diagnosis not present

## 2017-08-27 DIAGNOSIS — M25532 Pain in left wrist: Secondary | ICD-10-CM | POA: Diagnosis not present

## 2017-09-02 DIAGNOSIS — G309 Alzheimer's disease, unspecified: Secondary | ICD-10-CM | POA: Diagnosis not present

## 2017-09-03 DIAGNOSIS — G309 Alzheimer's disease, unspecified: Secondary | ICD-10-CM | POA: Diagnosis not present

## 2017-09-05 DIAGNOSIS — G309 Alzheimer's disease, unspecified: Secondary | ICD-10-CM | POA: Diagnosis not present

## 2017-09-07 DIAGNOSIS — E559 Vitamin D deficiency, unspecified: Secondary | ICD-10-CM | POA: Diagnosis not present

## 2017-09-07 DIAGNOSIS — G4700 Insomnia, unspecified: Secondary | ICD-10-CM | POA: Diagnosis not present

## 2017-09-07 DIAGNOSIS — E440 Moderate protein-calorie malnutrition: Secondary | ICD-10-CM | POA: Diagnosis not present

## 2017-09-07 DIAGNOSIS — F39 Unspecified mood [affective] disorder: Secondary | ICD-10-CM | POA: Diagnosis not present

## 2017-09-07 DIAGNOSIS — G309 Alzheimer's disease, unspecified: Secondary | ICD-10-CM | POA: Diagnosis not present

## 2017-09-08 DIAGNOSIS — G309 Alzheimer's disease, unspecified: Secondary | ICD-10-CM | POA: Diagnosis not present

## 2017-09-09 DIAGNOSIS — G309 Alzheimer's disease, unspecified: Secondary | ICD-10-CM | POA: Diagnosis not present

## 2017-09-10 DIAGNOSIS — E559 Vitamin D deficiency, unspecified: Secondary | ICD-10-CM | POA: Diagnosis not present

## 2017-09-10 DIAGNOSIS — G4700 Insomnia, unspecified: Secondary | ICD-10-CM | POA: Diagnosis not present

## 2017-09-10 DIAGNOSIS — F39 Unspecified mood [affective] disorder: Secondary | ICD-10-CM | POA: Diagnosis not present

## 2017-09-10 DIAGNOSIS — E440 Moderate protein-calorie malnutrition: Secondary | ICD-10-CM | POA: Diagnosis not present

## 2017-09-10 DIAGNOSIS — G309 Alzheimer's disease, unspecified: Secondary | ICD-10-CM | POA: Diagnosis not present

## 2017-09-11 DIAGNOSIS — G309 Alzheimer's disease, unspecified: Secondary | ICD-10-CM | POA: Diagnosis not present

## 2017-09-13 DIAGNOSIS — R4182 Altered mental status, unspecified: Secondary | ICD-10-CM | POA: Diagnosis not present

## 2017-09-13 DIAGNOSIS — Z79899 Other long term (current) drug therapy: Secondary | ICD-10-CM | POA: Diagnosis not present

## 2017-09-14 DIAGNOSIS — G309 Alzheimer's disease, unspecified: Secondary | ICD-10-CM | POA: Diagnosis not present

## 2017-09-15 DIAGNOSIS — G309 Alzheimer's disease, unspecified: Secondary | ICD-10-CM | POA: Diagnosis not present

## 2017-09-16 DIAGNOSIS — G309 Alzheimer's disease, unspecified: Secondary | ICD-10-CM | POA: Diagnosis not present

## 2017-09-17 DIAGNOSIS — E119 Type 2 diabetes mellitus without complications: Secondary | ICD-10-CM | POA: Diagnosis not present

## 2017-09-17 DIAGNOSIS — G4700 Insomnia, unspecified: Secondary | ICD-10-CM | POA: Diagnosis not present

## 2017-09-17 DIAGNOSIS — G309 Alzheimer's disease, unspecified: Secondary | ICD-10-CM | POA: Diagnosis not present

## 2017-09-17 DIAGNOSIS — E785 Hyperlipidemia, unspecified: Secondary | ICD-10-CM | POA: Diagnosis not present

## 2017-09-18 DIAGNOSIS — G309 Alzheimer's disease, unspecified: Secondary | ICD-10-CM | POA: Diagnosis not present

## 2017-09-21 DIAGNOSIS — G309 Alzheimer's disease, unspecified: Secondary | ICD-10-CM | POA: Diagnosis not present

## 2017-09-22 DIAGNOSIS — G309 Alzheimer's disease, unspecified: Secondary | ICD-10-CM | POA: Diagnosis not present

## 2017-09-23 DIAGNOSIS — G309 Alzheimer's disease, unspecified: Secondary | ICD-10-CM | POA: Diagnosis not present

## 2017-09-24 DIAGNOSIS — G309 Alzheimer's disease, unspecified: Secondary | ICD-10-CM | POA: Diagnosis not present

## 2017-09-25 DIAGNOSIS — G309 Alzheimer's disease, unspecified: Secondary | ICD-10-CM | POA: Diagnosis not present

## 2017-09-28 DIAGNOSIS — G309 Alzheimer's disease, unspecified: Secondary | ICD-10-CM | POA: Diagnosis not present

## 2017-09-29 DIAGNOSIS — G309 Alzheimer's disease, unspecified: Secondary | ICD-10-CM | POA: Diagnosis not present

## 2017-09-30 DIAGNOSIS — G309 Alzheimer's disease, unspecified: Secondary | ICD-10-CM | POA: Diagnosis not present

## 2017-09-30 DIAGNOSIS — E119 Type 2 diabetes mellitus without complications: Secondary | ICD-10-CM | POA: Diagnosis not present

## 2017-09-30 DIAGNOSIS — E785 Hyperlipidemia, unspecified: Secondary | ICD-10-CM | POA: Diagnosis not present

## 2017-09-30 DIAGNOSIS — F39 Unspecified mood [affective] disorder: Secondary | ICD-10-CM | POA: Diagnosis not present

## 2017-09-30 DIAGNOSIS — G4700 Insomnia, unspecified: Secondary | ICD-10-CM | POA: Diagnosis not present

## 2017-10-01 DIAGNOSIS — G309 Alzheimer's disease, unspecified: Secondary | ICD-10-CM | POA: Diagnosis not present

## 2017-10-02 DIAGNOSIS — G309 Alzheimer's disease, unspecified: Secondary | ICD-10-CM | POA: Diagnosis not present

## 2017-10-20 DIAGNOSIS — G309 Alzheimer's disease, unspecified: Secondary | ICD-10-CM | POA: Diagnosis not present

## 2017-10-20 DIAGNOSIS — Z79899 Other long term (current) drug therapy: Secondary | ICD-10-CM | POA: Diagnosis not present

## 2017-10-21 DIAGNOSIS — G309 Alzheimer's disease, unspecified: Secondary | ICD-10-CM | POA: Diagnosis not present

## 2017-10-22 DIAGNOSIS — M25531 Pain in right wrist: Secondary | ICD-10-CM | POA: Diagnosis not present

## 2017-10-22 DIAGNOSIS — M79631 Pain in right forearm: Secondary | ICD-10-CM | POA: Diagnosis not present

## 2017-10-22 DIAGNOSIS — G309 Alzheimer's disease, unspecified: Secondary | ICD-10-CM | POA: Diagnosis not present

## 2017-10-23 DIAGNOSIS — G309 Alzheimer's disease, unspecified: Secondary | ICD-10-CM | POA: Diagnosis not present

## 2017-10-24 DIAGNOSIS — G309 Alzheimer's disease, unspecified: Secondary | ICD-10-CM | POA: Diagnosis not present

## 2017-10-26 DIAGNOSIS — G309 Alzheimer's disease, unspecified: Secondary | ICD-10-CM | POA: Diagnosis not present

## 2017-10-27 DIAGNOSIS — G309 Alzheimer's disease, unspecified: Secondary | ICD-10-CM | POA: Diagnosis not present

## 2017-10-28 DIAGNOSIS — G309 Alzheimer's disease, unspecified: Secondary | ICD-10-CM | POA: Diagnosis not present

## 2017-10-29 DIAGNOSIS — G309 Alzheimer's disease, unspecified: Secondary | ICD-10-CM | POA: Diagnosis not present

## 2017-10-30 DIAGNOSIS — G309 Alzheimer's disease, unspecified: Secondary | ICD-10-CM | POA: Diagnosis not present

## 2017-11-02 DIAGNOSIS — G309 Alzheimer's disease, unspecified: Secondary | ICD-10-CM | POA: Diagnosis not present

## 2017-11-03 DIAGNOSIS — G309 Alzheimer's disease, unspecified: Secondary | ICD-10-CM | POA: Diagnosis not present

## 2017-11-04 DIAGNOSIS — G309 Alzheimer's disease, unspecified: Secondary | ICD-10-CM | POA: Diagnosis not present

## 2017-11-05 DIAGNOSIS — G309 Alzheimer's disease, unspecified: Secondary | ICD-10-CM | POA: Diagnosis not present

## 2017-11-06 DIAGNOSIS — G309 Alzheimer's disease, unspecified: Secondary | ICD-10-CM | POA: Diagnosis not present

## 2017-11-09 DIAGNOSIS — G309 Alzheimer's disease, unspecified: Secondary | ICD-10-CM | POA: Diagnosis not present

## 2017-11-10 DIAGNOSIS — R269 Unspecified abnormalities of gait and mobility: Secondary | ICD-10-CM | POA: Diagnosis not present

## 2017-11-10 DIAGNOSIS — E440 Moderate protein-calorie malnutrition: Secondary | ICD-10-CM | POA: Diagnosis not present

## 2017-11-10 DIAGNOSIS — E119 Type 2 diabetes mellitus without complications: Secondary | ICD-10-CM | POA: Diagnosis not present

## 2017-11-10 DIAGNOSIS — G309 Alzheimer's disease, unspecified: Secondary | ICD-10-CM | POA: Diagnosis not present

## 2017-11-11 DIAGNOSIS — G309 Alzheimer's disease, unspecified: Secondary | ICD-10-CM | POA: Diagnosis not present

## 2017-11-12 DIAGNOSIS — G309 Alzheimer's disease, unspecified: Secondary | ICD-10-CM | POA: Diagnosis not present

## 2017-11-13 DIAGNOSIS — Z23 Encounter for immunization: Secondary | ICD-10-CM | POA: Diagnosis not present

## 2017-11-14 DIAGNOSIS — G309 Alzheimer's disease, unspecified: Secondary | ICD-10-CM | POA: Diagnosis not present

## 2017-11-16 DIAGNOSIS — G309 Alzheimer's disease, unspecified: Secondary | ICD-10-CM | POA: Diagnosis not present

## 2017-11-18 DIAGNOSIS — E785 Hyperlipidemia, unspecified: Secondary | ICD-10-CM | POA: Diagnosis not present

## 2017-11-18 DIAGNOSIS — F39 Unspecified mood [affective] disorder: Secondary | ICD-10-CM | POA: Diagnosis not present

## 2017-11-18 DIAGNOSIS — E119 Type 2 diabetes mellitus without complications: Secondary | ICD-10-CM | POA: Diagnosis not present

## 2017-11-18 DIAGNOSIS — G4700 Insomnia, unspecified: Secondary | ICD-10-CM | POA: Diagnosis not present

## 2017-12-21 DIAGNOSIS — F324 Major depressive disorder, single episode, in partial remission: Secondary | ICD-10-CM | POA: Diagnosis not present

## 2017-12-21 DIAGNOSIS — F0151 Vascular dementia with behavioral disturbance: Secondary | ICD-10-CM | POA: Diagnosis not present

## 2017-12-21 DIAGNOSIS — G4701 Insomnia due to medical condition: Secondary | ICD-10-CM | POA: Diagnosis not present

## 2017-12-21 DIAGNOSIS — F0390 Unspecified dementia without behavioral disturbance: Secondary | ICD-10-CM | POA: Diagnosis not present

## 2017-12-23 IMAGING — CT CT HEAD W/O CM
3 of 4 series · 16 of 47 positions shown, 19 images · non-contrast
Comparison: 07/15/2010

CLINICAL DATA: Per EMS, patient came from [id number] (independent living for elderly) patient has a history of
dementia, had a recent fall, pt c/o left arm shoulder arm. Patient
is a poor historian due to dementia.

EXAM:
CT HEAD WITHOUT CONTRAST
TECHNIQUE: Contiguous axial images were obtained from the base of the skull
through the vertex without intravenous contrast.

[Series 2: head w/o · axial · non-contrast · 0.41mm/px · z∈[-59,+71]mm · 10 of 32 slices shown, 13 images]
[im 3/32  brain]
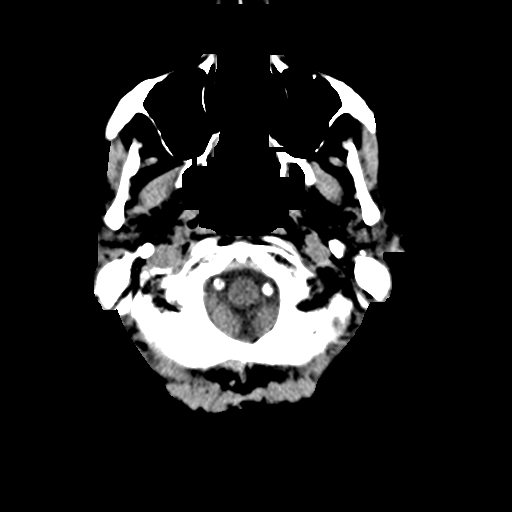
[im 3/32  bone]
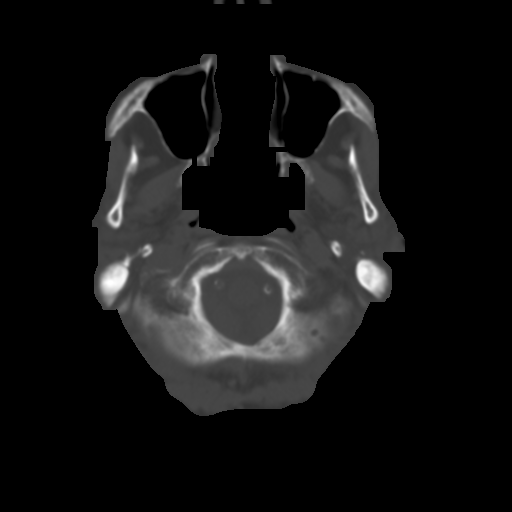
[im 5/32  brain]
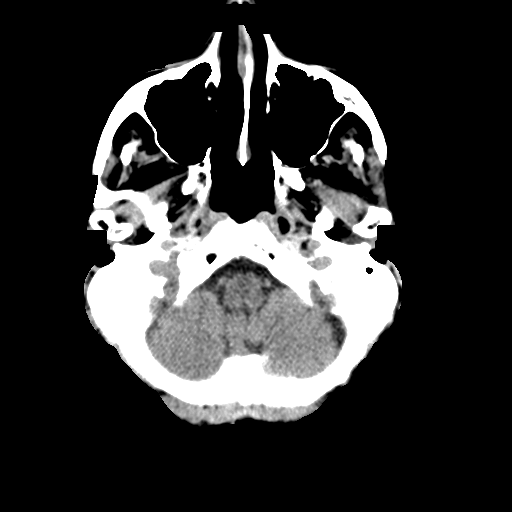
[im 9/32  brain]
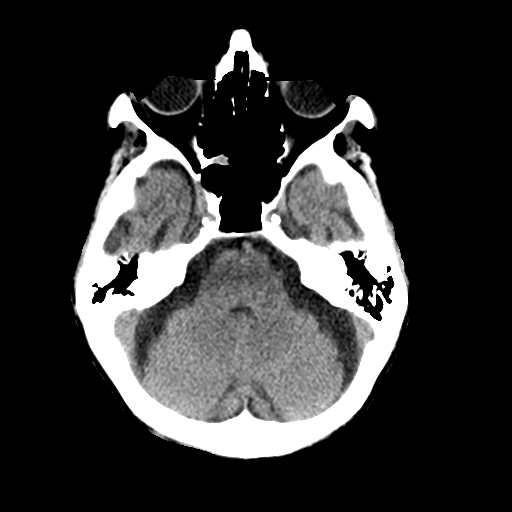
[im 12/32  brain]
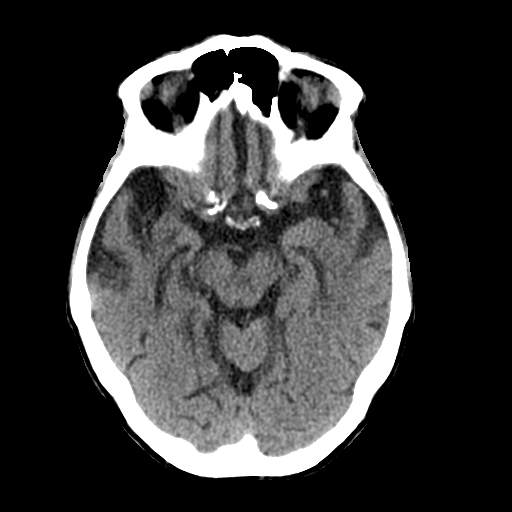
[im 14/32  brain]
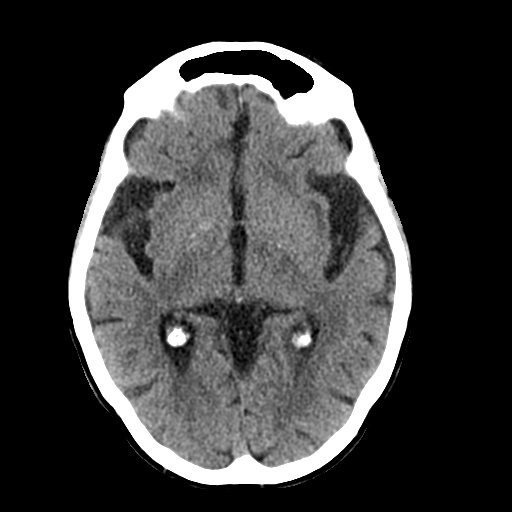
[im 14/32  bone]
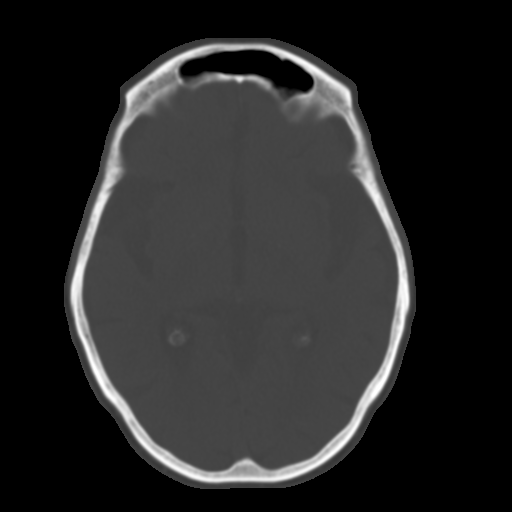
[im 18/32  brain]
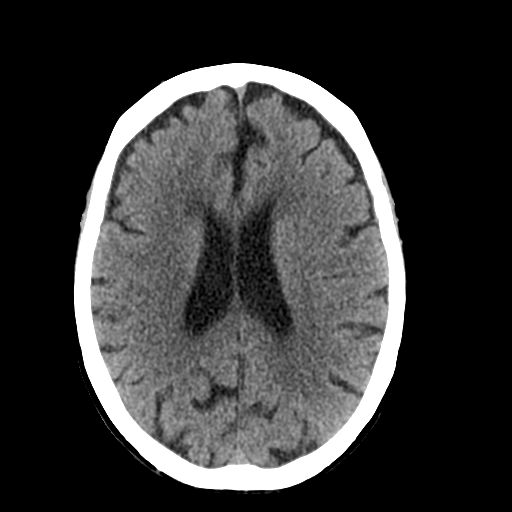
[im 20/32  brain]
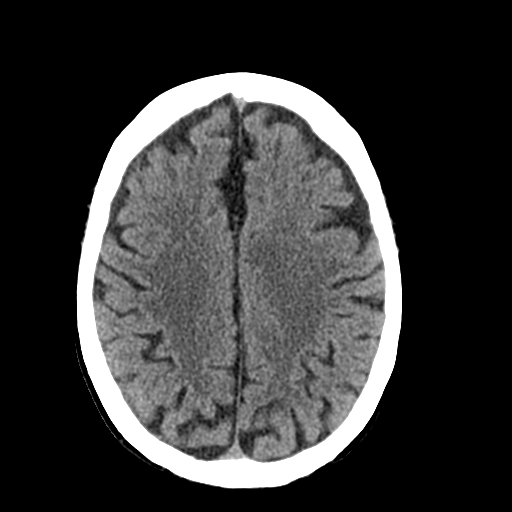
[im 23/32  brain]
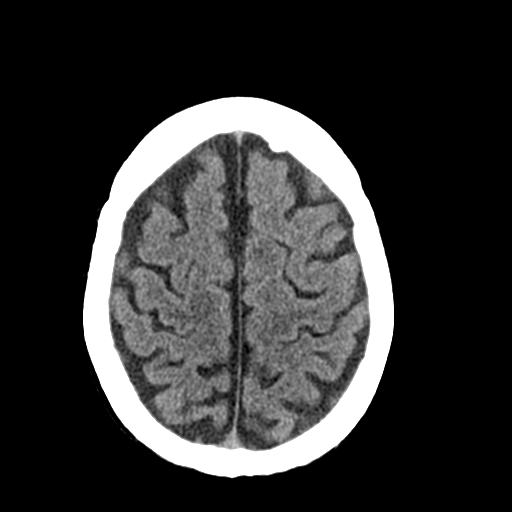
[im 27/32  brain]
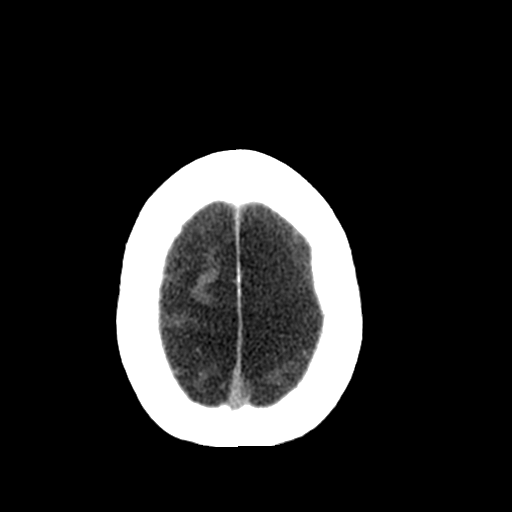
[im 27/32  bone]
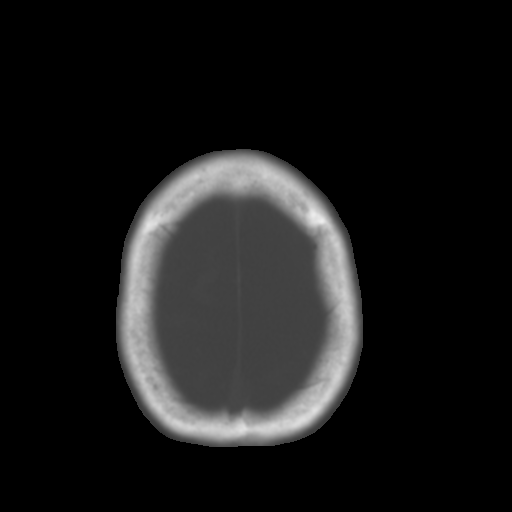
[im 29/32  brain]
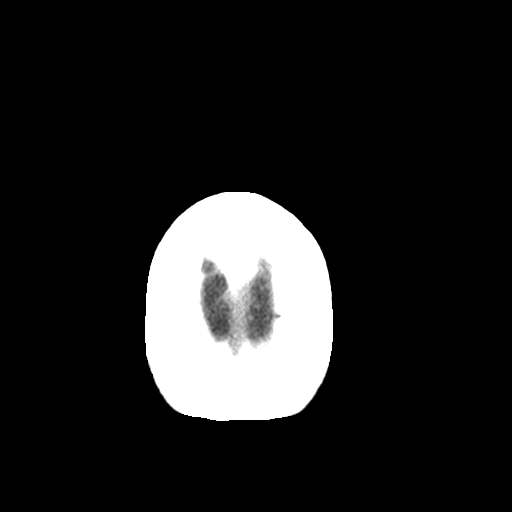

[Series 5: coronal · coronal · 0.31mm/px · 3 of 66 slices shown]
[im 22/66  brain]
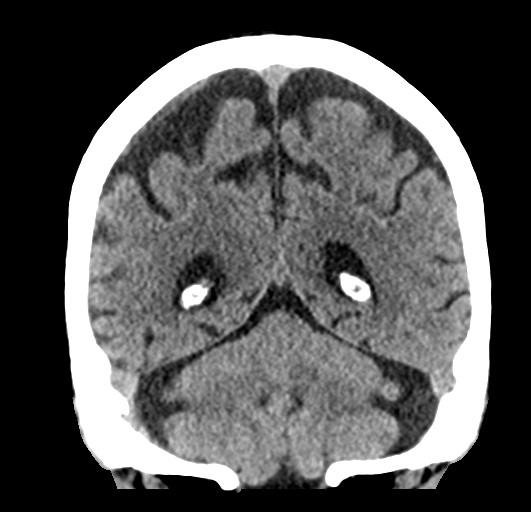
[im 29/66  brain]
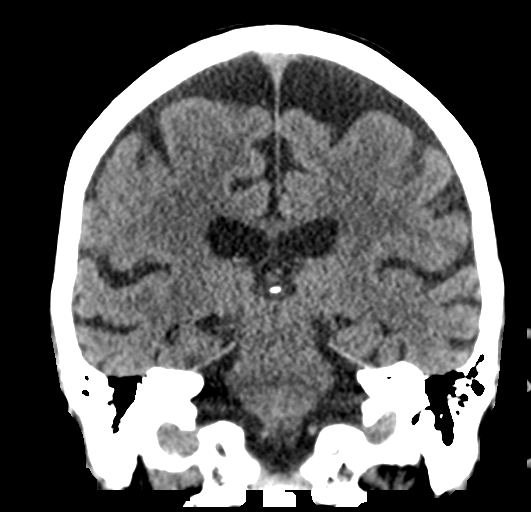
[im 37/66  brain]
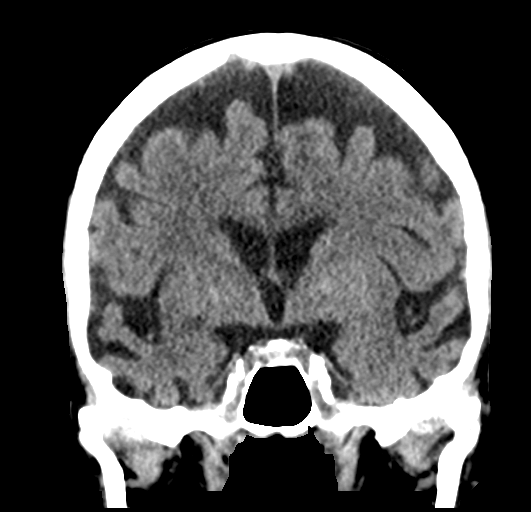

[Series 6: sagittal · sagittal · 0.30mm/px · 3 of 54 slices shown]
[im 18/54  brain]
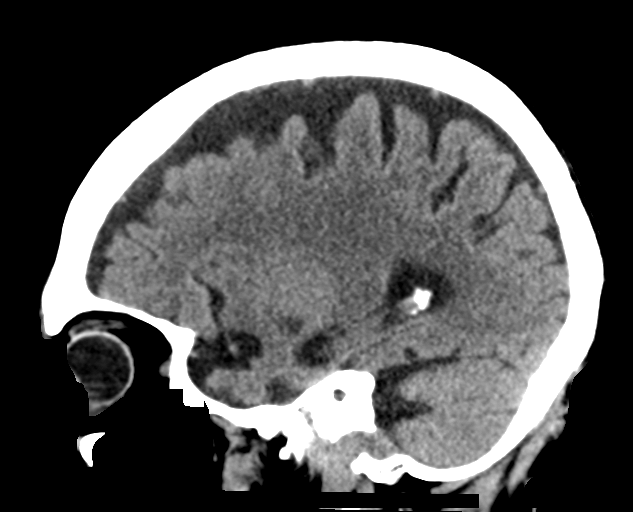
[im 27/54  brain]
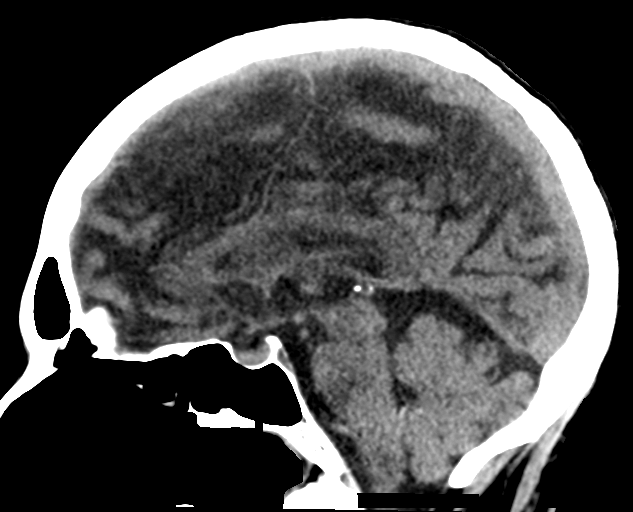
[im 36/54  brain]
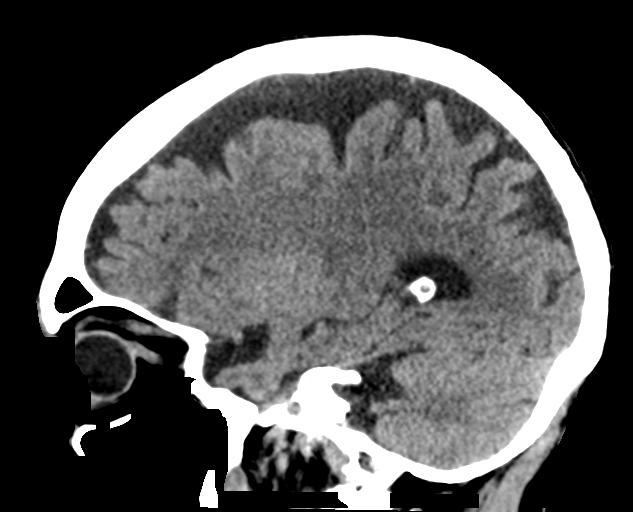

[16 of 47 positions shown; findings below may reference images not displayed]

FINDINGS: Brain: There is central and cortical atrophy. Periventricular white
matter changes are consistent with small vessel disease. There is no
intra or extra-axial fluid collection or mass lesion. The basilar
cisterns and ventricles have a normal appearance. There is no CT
evidence for acute infarction or hemorrhage.

Vascular: There is dense atherosclerotic calcification of the
internal carotid arteries.

Skull: Normal. Negative for fracture or focal lesion.

Sinuses/Orbits: No acute finding.

Other: There is cerumen within the rightexternal auditory canal.
IMPRESSION: 1. Atrophy and small vessel disease.
2.  No evidence for acute intracranial abnormality.
3. Impacted cerumen in the right external auditory canal.

## 2017-12-23 IMAGING — CR DG CHEST 2V
2 series · 2 of 2 positions shown · non-contrast
Comparison: 12/31/2010

CLINICAL DATA: Type 2 diabetic upper chest pain possibly from
unspecified fall.

EXAM:
CHEST  2 VIEW

[w chest lat]
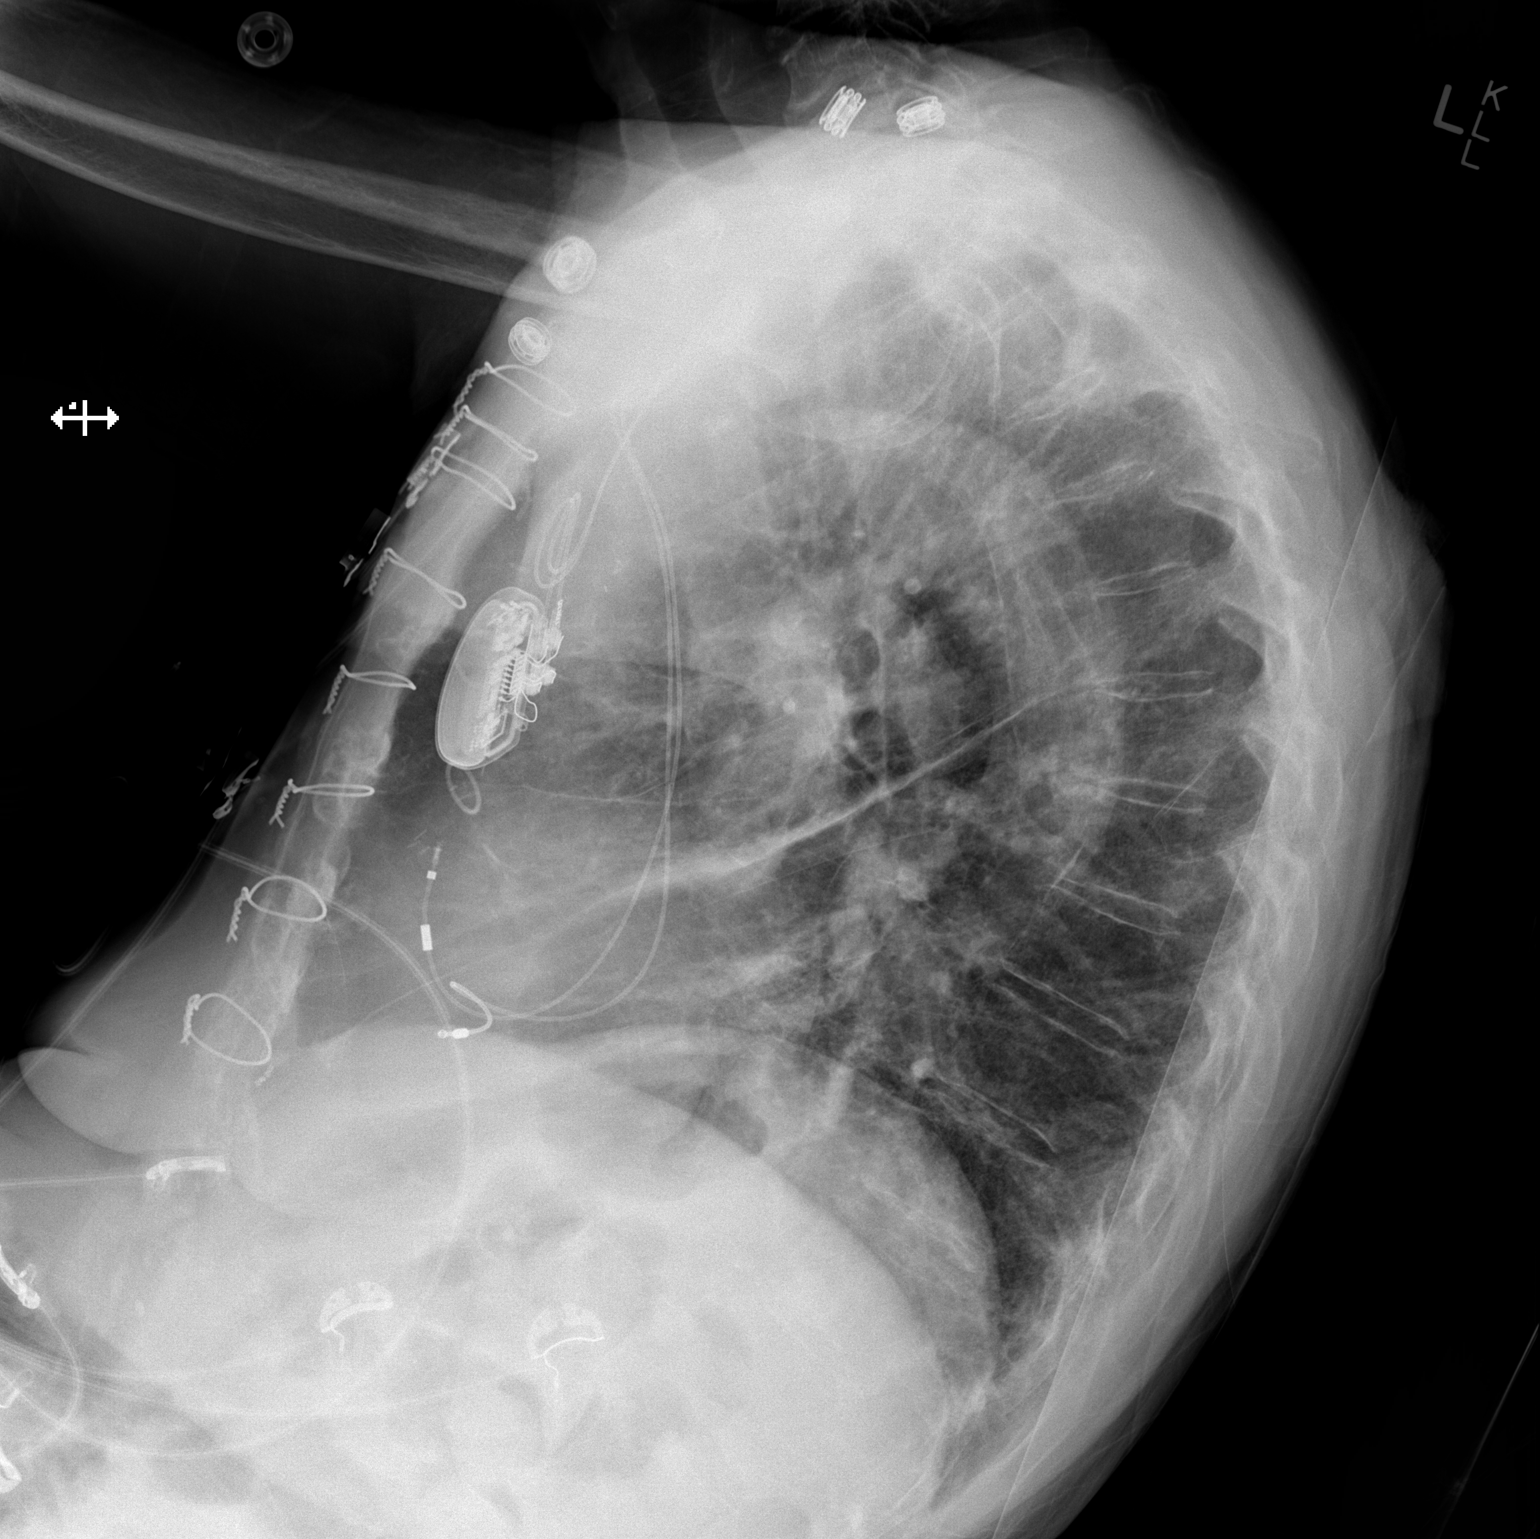

[x chest ap]
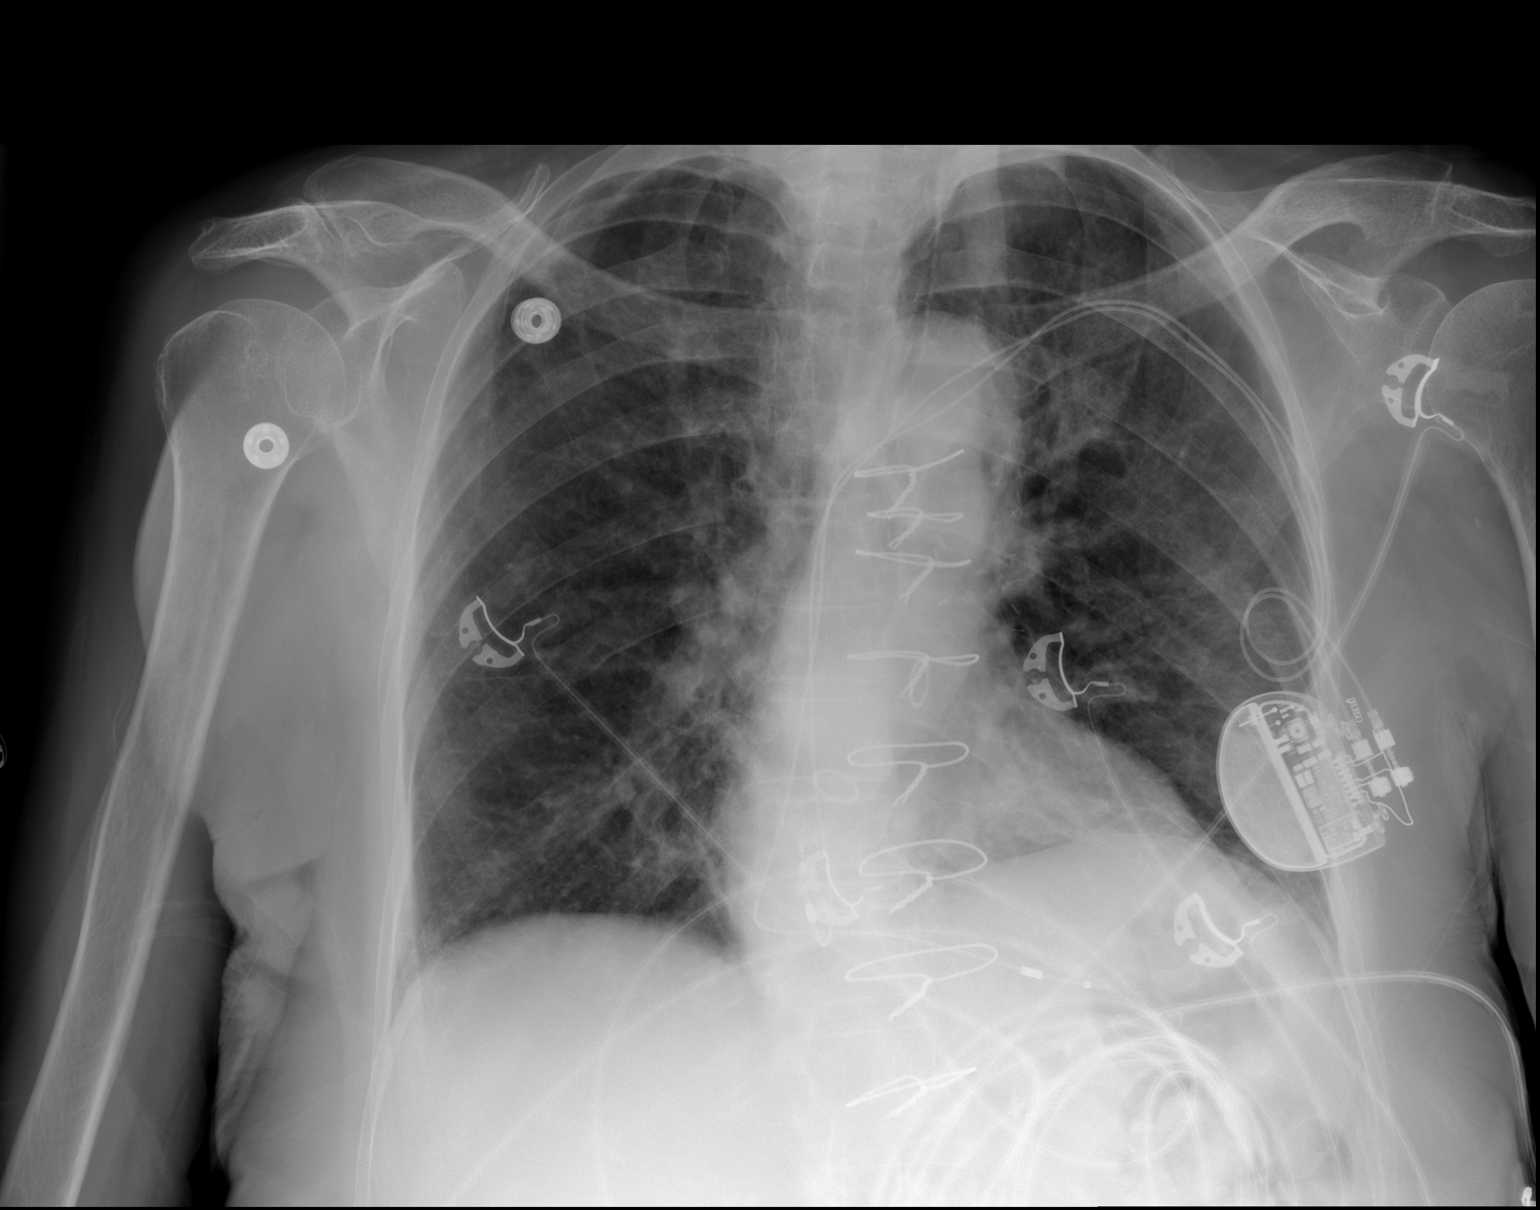

[2 of 2 positions shown; findings below may reference images not displayed]

FINDINGS: Status post median sternotomy. Left-sided pacemaker apparatus
projects over the left lung base with right atrial and right
ventricular leads noted. Mild vascular congestion is seen without
effusion or pneumothorax. No pulmonary consolidation. No acute nor
suspicious osseous lesions. There is mild dextroconvex curvature
lower thoracic spine. Aortic atherosclerosis of the aortic arch with
mild ectasia of the thoracic aorta is visualized. No aortic
aneurysm. Heart is normal in size. No acute nor suspicious osseous
abnormalities.
IMPRESSION: Mild pulmonary vascular congestion and aortic atherosclerosis. No
acute fracture noted.

## 2017-12-26 DIAGNOSIS — G4701 Insomnia due to medical condition: Secondary | ICD-10-CM | POA: Diagnosis not present

## 2017-12-26 DIAGNOSIS — F0151 Vascular dementia with behavioral disturbance: Secondary | ICD-10-CM | POA: Diagnosis not present

## 2017-12-31 DIAGNOSIS — Z79899 Other long term (current) drug therapy: Secondary | ICD-10-CM | POA: Diagnosis not present

## 2018-01-21 DIAGNOSIS — G4701 Insomnia due to medical condition: Secondary | ICD-10-CM | POA: Diagnosis not present

## 2018-01-21 DIAGNOSIS — F324 Major depressive disorder, single episode, in partial remission: Secondary | ICD-10-CM | POA: Diagnosis not present

## 2018-01-21 DIAGNOSIS — F0151 Vascular dementia with behavioral disturbance: Secondary | ICD-10-CM | POA: Diagnosis not present

## 2018-01-28 DIAGNOSIS — E559 Vitamin D deficiency, unspecified: Secondary | ICD-10-CM | POA: Diagnosis not present

## 2018-02-08 DIAGNOSIS — G4700 Insomnia, unspecified: Secondary | ICD-10-CM | POA: Diagnosis not present

## 2018-02-08 DIAGNOSIS — E119 Type 2 diabetes mellitus without complications: Secondary | ICD-10-CM | POA: Diagnosis not present

## 2018-02-08 DIAGNOSIS — F39 Unspecified mood [affective] disorder: Secondary | ICD-10-CM | POA: Diagnosis not present

## 2018-02-08 DIAGNOSIS — E785 Hyperlipidemia, unspecified: Secondary | ICD-10-CM | POA: Diagnosis not present

## 2018-02-18 DIAGNOSIS — F0151 Vascular dementia with behavioral disturbance: Secondary | ICD-10-CM | POA: Diagnosis not present

## 2018-02-18 DIAGNOSIS — G4701 Insomnia due to medical condition: Secondary | ICD-10-CM | POA: Diagnosis not present

## 2018-02-18 DIAGNOSIS — F324 Major depressive disorder, single episode, in partial remission: Secondary | ICD-10-CM | POA: Diagnosis not present

## 2018-03-15 DIAGNOSIS — F0151 Vascular dementia with behavioral disturbance: Secondary | ICD-10-CM | POA: Diagnosis not present

## 2018-03-15 DIAGNOSIS — F324 Major depressive disorder, single episode, in partial remission: Secondary | ICD-10-CM | POA: Diagnosis not present

## 2018-03-15 DIAGNOSIS — G4701 Insomnia due to medical condition: Secondary | ICD-10-CM | POA: Diagnosis not present

## 2018-03-27 DIAGNOSIS — F324 Major depressive disorder, single episode, in partial remission: Secondary | ICD-10-CM | POA: Diagnosis not present

## 2018-03-27 DIAGNOSIS — F0151 Vascular dementia with behavioral disturbance: Secondary | ICD-10-CM | POA: Diagnosis not present

## 2018-04-01 DIAGNOSIS — K5900 Constipation, unspecified: Secondary | ICD-10-CM | POA: Diagnosis not present

## 2018-04-02 DIAGNOSIS — E559 Vitamin D deficiency, unspecified: Secondary | ICD-10-CM | POA: Diagnosis not present

## 2018-04-02 DIAGNOSIS — F39 Unspecified mood [affective] disorder: Secondary | ICD-10-CM | POA: Diagnosis not present

## 2018-04-02 DIAGNOSIS — E119 Type 2 diabetes mellitus without complications: Secondary | ICD-10-CM | POA: Diagnosis not present

## 2018-04-02 DIAGNOSIS — E440 Moderate protein-calorie malnutrition: Secondary | ICD-10-CM | POA: Diagnosis not present

## 2018-04-13 DIAGNOSIS — R569 Unspecified convulsions: Secondary | ICD-10-CM | POA: Diagnosis not present

## 2018-04-13 DIAGNOSIS — Z79899 Other long term (current) drug therapy: Secondary | ICD-10-CM | POA: Diagnosis not present

## 2018-04-13 DIAGNOSIS — E119 Type 2 diabetes mellitus without complications: Secondary | ICD-10-CM | POA: Diagnosis not present

## 2018-04-21 DIAGNOSIS — G4701 Insomnia due to medical condition: Secondary | ICD-10-CM | POA: Diagnosis not present

## 2018-04-21 DIAGNOSIS — F324 Major depressive disorder, single episode, in partial remission: Secondary | ICD-10-CM | POA: Diagnosis not present

## 2018-04-21 DIAGNOSIS — F0151 Vascular dementia with behavioral disturbance: Secondary | ICD-10-CM | POA: Diagnosis not present

## 2018-05-05 DIAGNOSIS — F324 Major depressive disorder, single episode, in partial remission: Secondary | ICD-10-CM | POA: Diagnosis not present

## 2018-05-05 DIAGNOSIS — G4701 Insomnia due to medical condition: Secondary | ICD-10-CM | POA: Diagnosis not present

## 2018-05-05 DIAGNOSIS — F0151 Vascular dementia with behavioral disturbance: Secondary | ICD-10-CM | POA: Diagnosis not present

## 2018-05-13 DIAGNOSIS — B351 Tinea unguium: Secondary | ICD-10-CM | POA: Diagnosis not present

## 2018-05-13 DIAGNOSIS — M2041 Other hammer toe(s) (acquired), right foot: Secondary | ICD-10-CM | POA: Diagnosis not present

## 2018-05-13 DIAGNOSIS — M2042 Other hammer toe(s) (acquired), left foot: Secondary | ICD-10-CM | POA: Diagnosis not present

## 2018-05-13 DIAGNOSIS — I739 Peripheral vascular disease, unspecified: Secondary | ICD-10-CM | POA: Diagnosis not present

## 2018-05-26 DIAGNOSIS — G4700 Insomnia, unspecified: Secondary | ICD-10-CM | POA: Diagnosis not present

## 2018-05-26 DIAGNOSIS — E119 Type 2 diabetes mellitus without complications: Secondary | ICD-10-CM | POA: Diagnosis not present

## 2018-05-26 DIAGNOSIS — E785 Hyperlipidemia, unspecified: Secondary | ICD-10-CM | POA: Diagnosis not present

## 2018-05-26 DIAGNOSIS — F39 Unspecified mood [affective] disorder: Secondary | ICD-10-CM | POA: Diagnosis not present

## 2018-06-09 DIAGNOSIS — G4701 Insomnia due to medical condition: Secondary | ICD-10-CM | POA: Diagnosis not present

## 2018-06-09 DIAGNOSIS — F324 Major depressive disorder, single episode, in partial remission: Secondary | ICD-10-CM | POA: Diagnosis not present

## 2018-06-09 DIAGNOSIS — F0151 Vascular dementia with behavioral disturbance: Secondary | ICD-10-CM | POA: Diagnosis not present

## 2018-06-17 DIAGNOSIS — Z8673 Personal history of transient ischemic attack (TIA), and cerebral infarction without residual deficits: Secondary | ICD-10-CM | POA: Diagnosis not present

## 2018-06-17 DIAGNOSIS — E440 Moderate protein-calorie malnutrition: Secondary | ICD-10-CM | POA: Diagnosis not present

## 2018-06-17 DIAGNOSIS — E119 Type 2 diabetes mellitus without complications: Secondary | ICD-10-CM | POA: Diagnosis not present

## 2018-06-17 DIAGNOSIS — I10 Essential (primary) hypertension: Secondary | ICD-10-CM | POA: Diagnosis not present

## 2018-06-23 DIAGNOSIS — F324 Major depressive disorder, single episode, in partial remission: Secondary | ICD-10-CM | POA: Diagnosis not present

## 2018-06-23 DIAGNOSIS — F0151 Vascular dementia with behavioral disturbance: Secondary | ICD-10-CM | POA: Diagnosis not present

## 2018-06-23 DIAGNOSIS — G4701 Insomnia due to medical condition: Secondary | ICD-10-CM | POA: Diagnosis not present

## 2018-06-27 DIAGNOSIS — F324 Major depressive disorder, single episode, in partial remission: Secondary | ICD-10-CM | POA: Diagnosis not present

## 2018-06-27 DIAGNOSIS — F0151 Vascular dementia with behavioral disturbance: Secondary | ICD-10-CM | POA: Diagnosis not present

## 2018-06-28 DIAGNOSIS — Z20828 Contact with and (suspected) exposure to other viral communicable diseases: Secondary | ICD-10-CM | POA: Diagnosis not present

## 2018-06-28 DIAGNOSIS — Z1383 Encounter for screening for respiratory disorder NEC: Secondary | ICD-10-CM | POA: Diagnosis not present

## 2018-07-07 DIAGNOSIS — D649 Anemia, unspecified: Secondary | ICD-10-CM | POA: Diagnosis not present

## 2018-07-07 DIAGNOSIS — E7800 Pure hypercholesterolemia, unspecified: Secondary | ICD-10-CM | POA: Diagnosis not present

## 2018-07-07 DIAGNOSIS — N189 Chronic kidney disease, unspecified: Secondary | ICD-10-CM | POA: Diagnosis not present

## 2018-07-07 DIAGNOSIS — E119 Type 2 diabetes mellitus without complications: Secondary | ICD-10-CM | POA: Diagnosis not present

## 2018-07-07 DIAGNOSIS — Z79899 Other long term (current) drug therapy: Secondary | ICD-10-CM | POA: Diagnosis not present

## 2018-07-13 DIAGNOSIS — R278 Other lack of coordination: Secondary | ICD-10-CM | POA: Diagnosis not present

## 2018-07-13 DIAGNOSIS — G309 Alzheimer's disease, unspecified: Secondary | ICD-10-CM | POA: Diagnosis not present

## 2018-07-14 DIAGNOSIS — G309 Alzheimer's disease, unspecified: Secondary | ICD-10-CM | POA: Diagnosis not present

## 2018-07-14 DIAGNOSIS — R278 Other lack of coordination: Secondary | ICD-10-CM | POA: Diagnosis not present

## 2018-07-15 DIAGNOSIS — G309 Alzheimer's disease, unspecified: Secondary | ICD-10-CM | POA: Diagnosis not present

## 2018-07-15 DIAGNOSIS — R278 Other lack of coordination: Secondary | ICD-10-CM | POA: Diagnosis not present

## 2018-07-16 DIAGNOSIS — G309 Alzheimer's disease, unspecified: Secondary | ICD-10-CM | POA: Diagnosis not present

## 2018-07-16 DIAGNOSIS — R278 Other lack of coordination: Secondary | ICD-10-CM | POA: Diagnosis not present

## 2018-07-17 DIAGNOSIS — G309 Alzheimer's disease, unspecified: Secondary | ICD-10-CM | POA: Diagnosis not present

## 2018-07-17 DIAGNOSIS — R278 Other lack of coordination: Secondary | ICD-10-CM | POA: Diagnosis not present

## 2018-07-18 DIAGNOSIS — G309 Alzheimer's disease, unspecified: Secondary | ICD-10-CM | POA: Diagnosis not present

## 2018-07-18 DIAGNOSIS — R278 Other lack of coordination: Secondary | ICD-10-CM | POA: Diagnosis not present

## 2018-07-19 DIAGNOSIS — R278 Other lack of coordination: Secondary | ICD-10-CM | POA: Diagnosis not present

## 2018-07-19 DIAGNOSIS — G309 Alzheimer's disease, unspecified: Secondary | ICD-10-CM | POA: Diagnosis not present

## 2018-07-21 DIAGNOSIS — Z8673 Personal history of transient ischemic attack (TIA), and cerebral infarction without residual deficits: Secondary | ICD-10-CM | POA: Diagnosis not present

## 2018-07-21 DIAGNOSIS — F324 Major depressive disorder, single episode, in partial remission: Secondary | ICD-10-CM | POA: Diagnosis not present

## 2018-07-21 DIAGNOSIS — G309 Alzheimer's disease, unspecified: Secondary | ICD-10-CM | POA: Diagnosis not present

## 2018-07-21 DIAGNOSIS — R278 Other lack of coordination: Secondary | ICD-10-CM | POA: Diagnosis not present

## 2018-07-21 DIAGNOSIS — I10 Essential (primary) hypertension: Secondary | ICD-10-CM | POA: Diagnosis not present

## 2018-07-21 DIAGNOSIS — E440 Moderate protein-calorie malnutrition: Secondary | ICD-10-CM | POA: Diagnosis not present

## 2018-07-21 DIAGNOSIS — G4701 Insomnia due to medical condition: Secondary | ICD-10-CM | POA: Diagnosis not present

## 2018-07-21 DIAGNOSIS — F0151 Vascular dementia with behavioral disturbance: Secondary | ICD-10-CM | POA: Diagnosis not present

## 2018-07-21 DIAGNOSIS — E119 Type 2 diabetes mellitus without complications: Secondary | ICD-10-CM | POA: Diagnosis not present

## 2018-07-22 DIAGNOSIS — R278 Other lack of coordination: Secondary | ICD-10-CM | POA: Diagnosis not present

## 2018-07-22 DIAGNOSIS — G309 Alzheimer's disease, unspecified: Secondary | ICD-10-CM | POA: Diagnosis not present

## 2018-07-23 DIAGNOSIS — G309 Alzheimer's disease, unspecified: Secondary | ICD-10-CM | POA: Diagnosis not present

## 2018-07-23 DIAGNOSIS — R278 Other lack of coordination: Secondary | ICD-10-CM | POA: Diagnosis not present

## 2018-07-27 DIAGNOSIS — G309 Alzheimer's disease, unspecified: Secondary | ICD-10-CM | POA: Diagnosis not present

## 2018-07-27 DIAGNOSIS — R278 Other lack of coordination: Secondary | ICD-10-CM | POA: Diagnosis not present

## 2018-07-29 DIAGNOSIS — G309 Alzheimer's disease, unspecified: Secondary | ICD-10-CM | POA: Diagnosis not present

## 2018-07-29 DIAGNOSIS — R278 Other lack of coordination: Secondary | ICD-10-CM | POA: Diagnosis not present

## 2018-07-30 DIAGNOSIS — R278 Other lack of coordination: Secondary | ICD-10-CM | POA: Diagnosis not present

## 2018-07-30 DIAGNOSIS — G309 Alzheimer's disease, unspecified: Secondary | ICD-10-CM | POA: Diagnosis not present

## 2018-07-31 DIAGNOSIS — R278 Other lack of coordination: Secondary | ICD-10-CM | POA: Diagnosis not present

## 2018-07-31 DIAGNOSIS — G309 Alzheimer's disease, unspecified: Secondary | ICD-10-CM | POA: Diagnosis not present

## 2018-08-01 DIAGNOSIS — R278 Other lack of coordination: Secondary | ICD-10-CM | POA: Diagnosis not present

## 2018-08-01 DIAGNOSIS — G309 Alzheimer's disease, unspecified: Secondary | ICD-10-CM | POA: Diagnosis not present

## 2018-08-02 DIAGNOSIS — R278 Other lack of coordination: Secondary | ICD-10-CM | POA: Diagnosis not present

## 2018-08-02 DIAGNOSIS — G309 Alzheimer's disease, unspecified: Secondary | ICD-10-CM | POA: Diagnosis not present

## 2018-08-04 DIAGNOSIS — R278 Other lack of coordination: Secondary | ICD-10-CM | POA: Diagnosis not present

## 2018-08-04 DIAGNOSIS — G309 Alzheimer's disease, unspecified: Secondary | ICD-10-CM | POA: Diagnosis not present

## 2018-08-05 DIAGNOSIS — G309 Alzheimer's disease, unspecified: Secondary | ICD-10-CM | POA: Diagnosis not present

## 2018-08-05 DIAGNOSIS — R278 Other lack of coordination: Secondary | ICD-10-CM | POA: Diagnosis not present

## 2018-08-06 DIAGNOSIS — G309 Alzheimer's disease, unspecified: Secondary | ICD-10-CM | POA: Diagnosis not present

## 2018-08-06 DIAGNOSIS — R278 Other lack of coordination: Secondary | ICD-10-CM | POA: Diagnosis not present

## 2018-08-08 DIAGNOSIS — R278 Other lack of coordination: Secondary | ICD-10-CM | POA: Diagnosis not present

## 2018-08-08 DIAGNOSIS — G309 Alzheimer's disease, unspecified: Secondary | ICD-10-CM | POA: Diagnosis not present

## 2018-08-09 DIAGNOSIS — G309 Alzheimer's disease, unspecified: Secondary | ICD-10-CM | POA: Diagnosis not present

## 2018-08-09 DIAGNOSIS — R278 Other lack of coordination: Secondary | ICD-10-CM | POA: Diagnosis not present

## 2018-08-10 DIAGNOSIS — R278 Other lack of coordination: Secondary | ICD-10-CM | POA: Diagnosis not present

## 2018-08-10 DIAGNOSIS — G309 Alzheimer's disease, unspecified: Secondary | ICD-10-CM | POA: Diagnosis not present

## 2018-08-11 DIAGNOSIS — R278 Other lack of coordination: Secondary | ICD-10-CM | POA: Diagnosis not present

## 2018-08-11 DIAGNOSIS — G309 Alzheimer's disease, unspecified: Secondary | ICD-10-CM | POA: Diagnosis not present

## 2018-08-12 DIAGNOSIS — G309 Alzheimer's disease, unspecified: Secondary | ICD-10-CM | POA: Diagnosis not present

## 2018-08-12 DIAGNOSIS — R278 Other lack of coordination: Secondary | ICD-10-CM | POA: Diagnosis not present

## 2018-08-15 DIAGNOSIS — G309 Alzheimer's disease, unspecified: Secondary | ICD-10-CM | POA: Diagnosis not present

## 2018-08-15 DIAGNOSIS — R278 Other lack of coordination: Secondary | ICD-10-CM | POA: Diagnosis not present

## 2018-08-16 DIAGNOSIS — G309 Alzheimer's disease, unspecified: Secondary | ICD-10-CM | POA: Diagnosis not present

## 2018-08-16 DIAGNOSIS — R278 Other lack of coordination: Secondary | ICD-10-CM | POA: Diagnosis not present

## 2018-08-18 DIAGNOSIS — G309 Alzheimer's disease, unspecified: Secondary | ICD-10-CM | POA: Diagnosis not present

## 2018-08-18 DIAGNOSIS — R278 Other lack of coordination: Secondary | ICD-10-CM | POA: Diagnosis not present

## 2018-08-19 DIAGNOSIS — R278 Other lack of coordination: Secondary | ICD-10-CM | POA: Diagnosis not present

## 2018-08-19 DIAGNOSIS — G309 Alzheimer's disease, unspecified: Secondary | ICD-10-CM | POA: Diagnosis not present

## 2018-08-20 DIAGNOSIS — R278 Other lack of coordination: Secondary | ICD-10-CM | POA: Diagnosis not present

## 2018-08-20 DIAGNOSIS — E119 Type 2 diabetes mellitus without complications: Secondary | ICD-10-CM | POA: Diagnosis not present

## 2018-08-20 DIAGNOSIS — Z8673 Personal history of transient ischemic attack (TIA), and cerebral infarction without residual deficits: Secondary | ICD-10-CM | POA: Diagnosis not present

## 2018-08-20 DIAGNOSIS — E440 Moderate protein-calorie malnutrition: Secondary | ICD-10-CM | POA: Diagnosis not present

## 2018-08-20 DIAGNOSIS — G309 Alzheimer's disease, unspecified: Secondary | ICD-10-CM | POA: Diagnosis not present

## 2018-08-20 DIAGNOSIS — F0150 Vascular dementia without behavioral disturbance: Secondary | ICD-10-CM | POA: Diagnosis not present

## 2018-08-20 DIAGNOSIS — E559 Vitamin D deficiency, unspecified: Secondary | ICD-10-CM | POA: Diagnosis not present

## 2018-08-20 DIAGNOSIS — R4586 Emotional lability: Secondary | ICD-10-CM | POA: Diagnosis not present

## 2018-08-20 DIAGNOSIS — F324 Major depressive disorder, single episode, in partial remission: Secondary | ICD-10-CM | POA: Diagnosis not present

## 2018-08-25 DIAGNOSIS — F0151 Vascular dementia with behavioral disturbance: Secondary | ICD-10-CM | POA: Diagnosis not present

## 2018-08-25 DIAGNOSIS — F324 Major depressive disorder, single episode, in partial remission: Secondary | ICD-10-CM | POA: Diagnosis not present

## 2018-09-03 DIAGNOSIS — Z20828 Contact with and (suspected) exposure to other viral communicable diseases: Secondary | ICD-10-CM | POA: Diagnosis not present

## 2018-09-03 DIAGNOSIS — Z1383 Encounter for screening for respiratory disorder NEC: Secondary | ICD-10-CM | POA: Diagnosis not present

## 2018-09-09 DIAGNOSIS — Z1383 Encounter for screening for respiratory disorder NEC: Secondary | ICD-10-CM | POA: Diagnosis not present

## 2018-09-09 DIAGNOSIS — Z20828 Contact with and (suspected) exposure to other viral communicable diseases: Secondary | ICD-10-CM | POA: Diagnosis not present

## 2018-09-22 DIAGNOSIS — Z951 Presence of aortocoronary bypass graft: Secondary | ICD-10-CM | POA: Diagnosis not present

## 2018-09-22 DIAGNOSIS — E440 Moderate protein-calorie malnutrition: Secondary | ICD-10-CM | POA: Diagnosis not present

## 2018-09-22 DIAGNOSIS — I739 Peripheral vascular disease, unspecified: Secondary | ICD-10-CM | POA: Diagnosis not present

## 2018-09-22 DIAGNOSIS — F419 Anxiety disorder, unspecified: Secondary | ICD-10-CM | POA: Diagnosis not present

## 2018-09-22 DIAGNOSIS — F324 Major depressive disorder, single episode, in partial remission: Secondary | ICD-10-CM | POA: Diagnosis not present

## 2018-09-22 DIAGNOSIS — K219 Gastro-esophageal reflux disease without esophagitis: Secondary | ICD-10-CM | POA: Diagnosis not present

## 2018-09-22 DIAGNOSIS — Z8679 Personal history of other diseases of the circulatory system: Secondary | ICD-10-CM | POA: Diagnosis not present

## 2018-09-22 DIAGNOSIS — Z8673 Personal history of transient ischemic attack (TIA), and cerebral infarction without residual deficits: Secondary | ICD-10-CM | POA: Diagnosis not present

## 2018-09-22 DIAGNOSIS — R4586 Emotional lability: Secondary | ICD-10-CM | POA: Diagnosis not present

## 2018-09-22 DIAGNOSIS — F331 Major depressive disorder, recurrent, moderate: Secondary | ICD-10-CM | POA: Diagnosis not present

## 2018-09-22 DIAGNOSIS — F39 Unspecified mood [affective] disorder: Secondary | ICD-10-CM | POA: Diagnosis not present

## 2018-09-22 DIAGNOSIS — F0150 Vascular dementia without behavioral disturbance: Secondary | ICD-10-CM | POA: Diagnosis not present

## 2018-09-22 DIAGNOSIS — G309 Alzheimer's disease, unspecified: Secondary | ICD-10-CM | POA: Diagnosis not present

## 2018-09-22 DIAGNOSIS — E559 Vitamin D deficiency, unspecified: Secondary | ICD-10-CM | POA: Diagnosis not present

## 2018-09-22 DIAGNOSIS — F349 Persistent mood [affective] disorder, unspecified: Secondary | ICD-10-CM | POA: Diagnosis not present

## 2018-09-22 DIAGNOSIS — F0151 Vascular dementia with behavioral disturbance: Secondary | ICD-10-CM | POA: Diagnosis not present

## 2018-09-22 DIAGNOSIS — Z741 Need for assistance with personal care: Secondary | ICD-10-CM | POA: Diagnosis not present

## 2018-09-22 DIAGNOSIS — E119 Type 2 diabetes mellitus without complications: Secondary | ICD-10-CM | POA: Diagnosis not present

## 2018-09-22 DIAGNOSIS — E785 Hyperlipidemia, unspecified: Secondary | ICD-10-CM | POA: Diagnosis not present

## 2018-09-22 DIAGNOSIS — D509 Iron deficiency anemia, unspecified: Secondary | ICD-10-CM | POA: Diagnosis not present

## 2018-09-22 DIAGNOSIS — R2681 Unsteadiness on feet: Secondary | ICD-10-CM | POA: Diagnosis not present

## 2018-09-22 DIAGNOSIS — I10 Essential (primary) hypertension: Secondary | ICD-10-CM | POA: Diagnosis not present

## 2018-09-22 DIAGNOSIS — Z950 Presence of cardiac pacemaker: Secondary | ICD-10-CM | POA: Diagnosis not present

## 2018-09-22 DIAGNOSIS — G4700 Insomnia, unspecified: Secondary | ICD-10-CM | POA: Diagnosis not present

## 2018-09-22 DIAGNOSIS — U071 COVID-19: Secondary | ICD-10-CM | POA: Diagnosis not present

## 2018-09-30 DIAGNOSIS — E440 Moderate protein-calorie malnutrition: Secondary | ICD-10-CM | POA: Diagnosis not present

## 2018-09-30 DIAGNOSIS — U071 COVID-19: Secondary | ICD-10-CM | POA: Diagnosis not present

## 2018-09-30 DIAGNOSIS — E559 Vitamin D deficiency, unspecified: Secondary | ICD-10-CM | POA: Diagnosis not present

## 2018-09-30 DIAGNOSIS — E119 Type 2 diabetes mellitus without complications: Secondary | ICD-10-CM | POA: Diagnosis not present

## 2018-09-30 DIAGNOSIS — F324 Major depressive disorder, single episode, in partial remission: Secondary | ICD-10-CM | POA: Diagnosis not present

## 2018-09-30 DIAGNOSIS — R4586 Emotional lability: Secondary | ICD-10-CM | POA: Diagnosis not present

## 2018-09-30 DIAGNOSIS — F0150 Vascular dementia without behavioral disturbance: Secondary | ICD-10-CM | POA: Diagnosis not present

## 2018-09-30 DIAGNOSIS — Z8673 Personal history of transient ischemic attack (TIA), and cerebral infarction without residual deficits: Secondary | ICD-10-CM | POA: Diagnosis not present

## 2018-10-07 DIAGNOSIS — E559 Vitamin D deficiency, unspecified: Secondary | ICD-10-CM | POA: Diagnosis not present

## 2018-10-07 DIAGNOSIS — R4586 Emotional lability: Secondary | ICD-10-CM | POA: Diagnosis not present

## 2018-10-07 DIAGNOSIS — Z8673 Personal history of transient ischemic attack (TIA), and cerebral infarction without residual deficits: Secondary | ICD-10-CM | POA: Diagnosis not present

## 2018-10-07 DIAGNOSIS — U071 COVID-19: Secondary | ICD-10-CM | POA: Diagnosis not present

## 2018-10-07 DIAGNOSIS — F324 Major depressive disorder, single episode, in partial remission: Secondary | ICD-10-CM | POA: Diagnosis not present

## 2018-10-07 DIAGNOSIS — F0150 Vascular dementia without behavioral disturbance: Secondary | ICD-10-CM | POA: Diagnosis not present

## 2018-10-07 DIAGNOSIS — E440 Moderate protein-calorie malnutrition: Secondary | ICD-10-CM | POA: Diagnosis not present

## 2018-10-08 DIAGNOSIS — G309 Alzheimer's disease, unspecified: Secondary | ICD-10-CM | POA: Diagnosis not present

## 2018-10-08 DIAGNOSIS — R278 Other lack of coordination: Secondary | ICD-10-CM | POA: Diagnosis not present

## 2018-10-09 DIAGNOSIS — E440 Moderate protein-calorie malnutrition: Secondary | ICD-10-CM | POA: Diagnosis not present

## 2018-10-09 DIAGNOSIS — F324 Major depressive disorder, single episode, in partial remission: Secondary | ICD-10-CM | POA: Diagnosis not present

## 2018-10-09 DIAGNOSIS — R4586 Emotional lability: Secondary | ICD-10-CM | POA: Diagnosis not present

## 2018-10-09 DIAGNOSIS — Z8673 Personal history of transient ischemic attack (TIA), and cerebral infarction without residual deficits: Secondary | ICD-10-CM | POA: Diagnosis not present

## 2018-10-09 DIAGNOSIS — F0150 Vascular dementia without behavioral disturbance: Secondary | ICD-10-CM | POA: Diagnosis not present

## 2018-10-09 DIAGNOSIS — U071 COVID-19: Secondary | ICD-10-CM | POA: Diagnosis not present

## 2018-10-09 DIAGNOSIS — E119 Type 2 diabetes mellitus without complications: Secondary | ICD-10-CM | POA: Diagnosis not present

## 2018-10-09 DIAGNOSIS — E559 Vitamin D deficiency, unspecified: Secondary | ICD-10-CM | POA: Diagnosis not present

## 2018-10-11 DIAGNOSIS — R278 Other lack of coordination: Secondary | ICD-10-CM | POA: Diagnosis not present

## 2018-10-11 DIAGNOSIS — G309 Alzheimer's disease, unspecified: Secondary | ICD-10-CM | POA: Diagnosis not present

## 2018-10-12 DIAGNOSIS — R278 Other lack of coordination: Secondary | ICD-10-CM | POA: Diagnosis not present

## 2018-10-12 DIAGNOSIS — G309 Alzheimer's disease, unspecified: Secondary | ICD-10-CM | POA: Diagnosis not present

## 2018-10-13 DIAGNOSIS — G309 Alzheimer's disease, unspecified: Secondary | ICD-10-CM | POA: Diagnosis not present

## 2018-10-13 DIAGNOSIS — R278 Other lack of coordination: Secondary | ICD-10-CM | POA: Diagnosis not present

## 2018-10-14 DIAGNOSIS — R278 Other lack of coordination: Secondary | ICD-10-CM | POA: Diagnosis not present

## 2018-10-14 DIAGNOSIS — U071 COVID-19: Secondary | ICD-10-CM | POA: Diagnosis not present

## 2018-10-14 DIAGNOSIS — H0100A Unspecified blepharitis right eye, upper and lower eyelids: Secondary | ICD-10-CM | POA: Diagnosis not present

## 2018-10-14 DIAGNOSIS — G309 Alzheimer's disease, unspecified: Secondary | ICD-10-CM | POA: Diagnosis not present

## 2018-10-15 ENCOUNTER — Emergency Department (HOSPITAL_COMMUNITY)
Admission: EM | Admit: 2018-10-15 | Discharge: 2018-10-16 | Disposition: A | Discharge status: Home / Self Care | Payer: Medicare Other | Attending: Emergency Medicine | Admitting: Emergency Medicine

## 2018-10-15 DIAGNOSIS — N390 Urinary tract infection, site not specified: Secondary | ICD-10-CM | POA: Insufficient documentation

## 2018-10-15 DIAGNOSIS — Z79899 Other long term (current) drug therapy: Secondary | ICD-10-CM | POA: Diagnosis not present

## 2018-10-15 DIAGNOSIS — G309 Alzheimer's disease, unspecified: Secondary | ICD-10-CM | POA: Diagnosis not present

## 2018-10-15 DIAGNOSIS — Z87891 Personal history of nicotine dependence: Secondary | ICD-10-CM | POA: Insufficient documentation

## 2018-10-15 DIAGNOSIS — I2510 Atherosclerotic heart disease of native coronary artery without angina pectoris: Secondary | ICD-10-CM | POA: Insufficient documentation

## 2018-10-15 DIAGNOSIS — E119 Type 2 diabetes mellitus without complications: Secondary | ICD-10-CM | POA: Insufficient documentation

## 2018-10-15 DIAGNOSIS — F0390 Unspecified dementia without behavioral disturbance: Secondary | ICD-10-CM | POA: Diagnosis not present

## 2018-10-15 DIAGNOSIS — R319 Hematuria, unspecified: Secondary | ICD-10-CM

## 2018-10-15 DIAGNOSIS — Z7984 Long term (current) use of oral hypoglycemic drugs: Secondary | ICD-10-CM | POA: Insufficient documentation

## 2018-10-15 DIAGNOSIS — I10 Essential (primary) hypertension: Secondary | ICD-10-CM | POA: Diagnosis not present

## 2018-10-15 DIAGNOSIS — K921 Melena: Secondary | ICD-10-CM | POA: Diagnosis present

## 2018-10-15 DIAGNOSIS — R58 Hemorrhage, not elsewhere classified: Secondary | ICD-10-CM | POA: Diagnosis not present

## 2018-10-15 DIAGNOSIS — R278 Other lack of coordination: Secondary | ICD-10-CM | POA: Diagnosis not present

## 2018-10-15 NOTE — ED Triage Notes (Signed)
Pt brought in from [facility] after EMS was called out for "collapsed rectum". Per EMS, pt with advanced Alzheimer's and is a poor historian. States [facility] home was unable to provide much information other than "blood in stools".

## 2018-10-16 ENCOUNTER — Other Ambulatory Visit: Payer: Self-pay

## 2018-10-16 ENCOUNTER — Encounter (HOSPITAL_COMMUNITY): Payer: Self-pay | Admitting: Emergency Medicine

## 2018-10-16 DIAGNOSIS — N390 Urinary tract infection, site not specified: Secondary | ICD-10-CM | POA: Diagnosis not present

## 2018-10-16 LAB — BASIC METABOLIC PANEL
Anion gap: 9 (ref 5–15)
BUN: 26 mg/dL — ABNORMAL HIGH (ref 8–23)
CO2: 29 mmol/L (ref 22–32)
Calcium: 9.2 mg/dL (ref 8.9–10.3)
Chloride: 101 mmol/L (ref 98–111)
Creatinine, Ser: 0.73 mg/dL (ref 0.44–1.00)
GFR calc Af Amer: >60 mL/min (ref >60)
GFR calc non Af Amer: >60 mL/min (ref >60)
Glucose, Bld: 127 mg/dL — ABNORMAL HIGH (ref 70–99)
Potassium: 3.6 mmol/L (ref 3.5–5.1)
Sodium: 139 mmol/L (ref 135–145)

## 2018-10-16 LAB — URINALYSIS, ROUTINE W REFLEX MICROSCOPIC
Bilirubin Urine: NEGATIVE
Glucose, UA: NEGATIVE mg/dL
Ketones, ur: NEGATIVE mg/dL
Nitrite: NEGATIVE
Protein, ur: NEGATIVE mg/dL
RBC / HPF: >50 RBC/hpf — ABNORMAL HIGH (ref 0–5)
Specific Gravity, Urine: 1.01 (ref 1.005–1.030)
pH: 7 (ref 5.0–8.0)

## 2018-10-16 LAB — CBC WITH DIFFERENTIAL/PLATELET
Abs Immature Granulocytes: 0.03 10*3/uL (ref 0.00–0.07)
Basophils Absolute: 0.1 10*3/uL (ref 0.0–0.1)
Basophils Relative: 1 %
Eosinophils Absolute: 0.3 10*3/uL (ref 0.0–0.5)
Eosinophils Relative: 4 %
HCT: 38.3 % (ref 36.0–46.0)
Hemoglobin: 12 g/dL (ref 12.0–15.0)
Immature Granulocytes: 0 %
Lymphocytes Relative: 20 %
Lymphs Abs: 1.5 10*3/uL (ref 0.7–4.0)
MCH: 27.8 pg (ref 26.0–34.0)
MCHC: 31.3 g/dL (ref 30.0–36.0)
MCV: 88.9 fL (ref 80.0–100.0)
Monocytes Absolute: 1.1 10*3/uL — ABNORMAL HIGH (ref 0.1–1.0)
Monocytes Relative: 15 %
Neutro Abs: 4.5 10*3/uL (ref 1.7–7.7)
Neutrophils Relative %: 60 %
Platelets: 225 10*3/uL (ref 150–400)
RBC: 4.31 MIL/uL (ref 3.87–5.11)
RDW: 14.4 % (ref 11.5–15.5)
WBC: 7.5 10*3/uL (ref 4.0–10.5)
nRBC: 0 % (ref 0.0–0.2)

## 2018-10-16 MED ORDER — CEPHALEXIN 500 MG PO CAPS
500.0000 mg | ORAL_CAPSULE | Freq: Once | ORAL | Status: AC
  Administered 2018-10-16: 500 mg via ORAL
  Filled 2018-10-16: qty 1

## 2018-10-16 MED ORDER — CEPHALEXIN 500 MG PO CAPS: 500.0000 mg | ORAL_CAPSULE | Freq: Three times a day (TID) | ORAL | 0 refills | Status: DC

## 2018-10-16 NOTE — Discharge Instructions (Signed)
Her urine shows she has a urinary tract infection.  There was no obvious rectal bleeding seen, she does not have a rectal prolapse.  Her blood test show she is not anemic.  Give her the antibiotics until gone.

## 2018-10-16 NOTE — ED Provider Notes (Signed)
[facility] EMERGENCY DEPARTMENT Provider Note   CSN: [id number] Arrival date & time: 10/15/18  2341   Time seen 1:20 AM  History   Chief Complaint Chief Complaint  Patient presents with  . Blood In Stools   Level 5 caveat for dementia  HPI Valerie Carr is a 85 y.o. female.     HPI patient is extremely uncooperative.  She is initially pleasant and states she feels fine, however she refuses to be examined.  She starts hitting at the staff and had to be restrained to allow exam.  She reportedly had a rectal prolapse per EMS.  Her daughter said that she thought she might have a urinary tract infection.  Patient has been incontinent and her blanket and her padding on her stretcher is wet.  Patient had a positive COVID test 7 weeks ago.  PCP Harlan Stains, MD   Patient is DO NOT RESUSCITATE  Past Medical History:  Diagnosis Date  . Arteriosclerotic cardiovascular disease (ASCVD)   . Chronic anxiety    and depression  . Dementia ([place])   . DM type 2 (diabetes mellitus, type 2) ([place])   . Gallstones    asysmptomatic  . HTN (hypertension)   . Hyperlipidemia   . Sick sinus syndrome [facility])    s/p PPM    Patient Active Problem List   Diagnosis Date Noted  . Dementia with behavioral disturbance ([place])   . Type 2 diabetes mellitus without complication, without long-term current use of insulin ([place])   . Acute lower UTI 09/04/2016  . Acute cystitis without hematuria   . AKI (acute kidney injury) ([place]) 09/03/2016  . SBO (small bowel obstruction) ([place]) 04/18/2016  . Chronic atrial fibrillation ([place]) 05/31/2014  . Cardiac pacemaker in situ 05/31/2014  . Coronary artery disease involving coronary bypass graft of native heart without angina pectoris 05/31/2014  . Carotid artery stenosis, asymptomatic 05/31/2014  . Sick sinus syndrome ([place]) 04/18/2013  . Coronary atherosclerosis of native coronary artery 01/11/2013  . Essential hypertension, benign 01/11/2013  . Mixed  hyperlipidemia 01/11/2013  . Alzheimer's disease ([place]) 09/02/2012  . Bradycardia 01/06/2011    Past Surgical History:  Procedure Laterality Date  . ABDOMINAL SURGERY     12 inches taken out of "rotted bowel"  . APPENDECTOMY    . CORONARY ARTERY BYPASS GRAFT     07-11-2005, triple bypass  . ESOPHAGOGASTRODUODENOSCOPY    . LAPAROTOMY N/A 04/20/2016   Procedure: EXPLORATORY LAPAROTOMY, SMALL BOWEL RESECTION, LYSIS OF ADHESIONS;  Surgeon: Armandina Gemma, MD;  Location: [facility] ORS;  Service: General;  Laterality: N/A;  . PACEMAKER INSERTION  2004   [employer] PPM implanted by Dr Leonia Reeves, generator change by Dr Rayann Heman 01/07/11  . PERMANENT PACEMAKER GENERATOR CHANGE N/A 01/07/2011   Procedure: PERMANENT PACEMAKER GENERATOR CHANGE;  Surgeon: Thompson Grayer, MD;  Location: [facility] CATH LAB;  Service: Cardiovascular;  Laterality: N/A;  . TONSILLECTOMY    . TOTAL ABDOMINAL HYSTERECTOMY W/ BILATERAL SALPINGOOPHORECTOMY       OB History   No obstetric history on file.      Home Medications    Prior to Admission medications   Medication Sig Start Date End Date Taking? Authorizing Provider  cephALEXin (KEFLEX) 500 MG capsule Take 1 capsule (500 mg total) by mouth 3 (three) times daily. 10/16/18   Rolland Porter, MD  clindamycin (CLEOCIN) 300 MG capsule Take 1 capsule (300 mg total) by mouth 3 (three) times daily. 03/31/17   Valarie Merino, MD  donepezil (ARICEPT)  10 MG tablet TAKE 1 TABLET (10 MG TOTAL) BY MOUTH AT BEDTIME. 05/20/16   Marvel PlanXu, Jindong, MD  DULoxetine (CYMBALTA) 60 MG capsule Take 60 mg by mouth daily.     [provider]  memantine (NAMENDA) 10 MG tablet Take 1 tablet (10 mg total) by mouth 2 (two) times daily. 01/11/13   Ronal FearLam, Lynn E, NP  metFORMIN (GLUCOPHAGE-XR) 500 MG 24 hr tablet Take 500 mg by mouth daily with breakfast.      [provider]  ONE TOUCH ULTRA TEST test strip TEST DAILY DX E11.51 06/19/14   [provider]  QUEtiapine (SEROQUEL) 25 MG tablet Take 1  tablet (25 mg total) by mouth every morning. 11/08/15   Marvel PlanXu, Jindong, MD  rosuvastatin (CRESTOR) 20 MG tablet Take 20 mg by mouth daily. 03/21/16   [provider]  traZODone (DESYREL) 100 MG tablet Take 100 mg by mouth at bedtime.      [provider]  valsartan (DIOVAN) 160 MG tablet Take 160 mg by mouth daily.    [provider]    Family History Family History  Problem Relation Age of Onset  . Diabetes Mother   . Hypertension Mother   . Coronary artery disease Father   . Diabetes Brother   . Throat cancer Brother   . Diabetes Brother   . Hypertension Sister   . Cancer Son        esophageal    Social History Social History   Tobacco Use  . Smoking status: Former Smoker    Packs/day: 1.00    Years: 25.00    Pack years: 25.00    Quit date: 2001    Years since quitting: 19.7  . Smokeless tobacco: Former Engineer, waterUser  Substance Use Topics  . Alcohol use: No  . Drug use: No  lives in a NH   Allergies   Clarithromycin, Codeine phosphate, Effexor [venlafaxine hydrochloride], Fexofenadine hcl, Lisinopril, Prozac [fluoxetine hcl], Septra [bactrim], and Wellbutrin [bupropion hcl]   Review of Systems Review of Systems   Physical Exam Updated Vital Signs BP 124/78   Pulse 71   Temp (!) 97.5 F (36.4 C)   Resp 17   Ht 5\' 6"  (1.676 m)   Wt 60.8 kg   SpO2 99%   BMI 21.63 kg/m   Physical Exam Vitals signs and nursing note reviewed.  Constitutional:      Appearance: Normal appearance.     Comments: Frail older female who is easily agitated and becomes combative she is very loud and vocal.  HENT:     Head: Normocephalic and atraumatic.     Right Ear: External ear normal.     Left Ear: External ear normal.     Nose: Nose normal.     Mouth/Throat:     Comments: Refuses to wear a mask, will not open mouth Eyes:     Extraocular Movements: Extraocular movements intact.     Conjunctiva/sclera: Conjunctivae normal.     Pupils: Pupils are equal,  round, and reactive to light.  Neck:     Musculoskeletal: Normal range of motion.  Cardiovascular:     Rate and Rhythm: Normal rate and regular rhythm.  Pulmonary:     Effort: Pulmonary effort is normal. No respiratory distress.  Abdominal:     General: Bowel sounds are normal. There is no distension.     Palpations: Abdomen is soft.  Genitourinary:    Comments: There is no rectal prolapse seen, her rectal area is clean  without blood around it. Musculoskeletal: Normal range of motion.        General: No deformity.  Skin:    General: Skin is warm and dry.  Neurological:     General: No focal deficit present.     Mental Status: She is alert. Mental status is at baseline.     Cranial Nerves: No cranial nerve deficit.  Psychiatric:        Mood and Affect: Affect is labile.        Behavior: Behavior is uncooperative.      ED Treatments / Results  Labs (all labs ordered are listed, but only abnormal results are displayed) Results for orders placed or performed during the hospital encounter of 10/15/18  Basic metabolic panel  Result Value Ref Range   Sodium 139 135 - 145 mmol/L   Potassium 3.6 3.5 - 5.1 mmol/L   Chloride 101 98 - 111 mmol/L   CO2 29 22 - 32 mmol/L   Glucose, Bld 127 (H) 70 - 99 mg/dL   BUN 26 (H) 8 - 23 mg/dL   Creatinine, Ser 8.78 0.44 - 1.00 mg/dL   Calcium 9.2 8.9 - 67.6 mg/dL   GFR calc non Af Amer >60 >60 mL/min   GFR calc Af Amer >60 >60 mL/min   Anion gap 9 5 - 15  CBC with Differential  Result Value Ref Range   WBC 7.5 4.0 - 10.5 K/uL   RBC 4.31 3.87 - 5.11 MIL/uL   Hemoglobin 12.0 12.0 - 15.0 g/dL   HCT 72.0 94.7 - 09.6 %   MCV 88.9 80.0 - 100.0 fL   MCH 27.8 26.0 - 34.0 pg   MCHC 31.3 30.0 - 36.0 g/dL   RDW 28.3 66.2 - 94.7 %   Platelets 225 150 - 400 K/uL   nRBC 0.0 0.0 - 0.2 %   Neutrophils Relative % 60 %   Neutro Abs 4.5 1.7 - 7.7 K/uL   Lymphocytes Relative 20 %   Lymphs Abs 1.5 0.7 - 4.0 K/uL   Monocytes Relative 15 %   Monocytes  Absolute 1.1 (H) 0.1 - 1.0 K/uL   Eosinophils Relative 4 %   Eosinophils Absolute 0.3 0.0 - 0.5 K/uL   Basophils Relative 1 %   Basophils Absolute 0.1 0.0 - 0.1 K/uL   Immature Granulocytes 0 %   Abs Immature Granulocytes 0.03 0.00 - 0.07 K/uL  Urinalysis, Routine w reflex microscopic  Result Value Ref Range   Color, Urine YELLOW YELLOW   APPearance CLOUDY (A) CLEAR   Specific Gravity, Urine 1.010 1.005 - 1.030   pH 7.0 5.0 - 8.0   Glucose, UA NEGATIVE NEGATIVE mg/dL   Hgb urine dipstick SMALL (A) NEGATIVE   Bilirubin Urine NEGATIVE NEGATIVE   Ketones, ur NEGATIVE NEGATIVE mg/dL   Protein, ur NEGATIVE NEGATIVE mg/dL   Nitrite NEGATIVE NEGATIVE   Leukocytes,Ua LARGE (A) NEGATIVE   RBC / HPF >50 (H) 0 - 5 RBC/hpf   WBC, UA 11-20 0 - 5 WBC/hpf   Bacteria, UA RARE (A) NONE SEEN   Squamous Epithelial / LPF 0-5 0 - 5   WBC Clumps PRESENT    Laboratory interpretation all normal except possible UTI    EKG None  Radiology No results found.  Procedures Procedures (including critical care time)  Medications Ordered in ED Medications  cephALEXin (KEFLEX) capsule 500 mg (has no administration in time range)     Initial Impression / Assessment and Plan / ED Course  I  have reviewed the triage vital signs and the nursing notes.  Pertinent labs & imaging results that were available during my care of the patient were reviewed by me and considered in my medical decision making (see chart for details).      There was no blood seen around the rectum, there is no rectal prolapse seen.  Patient is not anemic.  She does have a urinary tract infection.  A urine culture was sent and she was started on oral Keflex.  She was sent back to her facility.  Final Clinical Impressions(s) / ED Diagnoses   Final diagnoses:  Urinary tract infection with hematuria, site unspecified    ED Discharge Orders         Ordered    cephALEXin (KEFLEX) 500 MG capsule  3 times daily     10/16/18 0347           Plan discharge  Devoria AlbeIva Wladyslaw Henrichs, MD, [place]cha PyoFACEP    Jaeleah Smyser, MD 10/16/18 [phone]

## 2018-10-16 NOTE — ED Notes (Signed)
Pt daughter notified   

## 2018-10-16 NOTE — ED Notes (Signed)
Pt and family member states pt is having painful urination

## 2018-10-18 ENCOUNTER — Emergency Department (HOSPITAL_COMMUNITY): Payer: Medicare Other

## 2018-10-18 ENCOUNTER — Encounter (HOSPITAL_COMMUNITY): Payer: Self-pay

## 2018-10-18 ENCOUNTER — Other Ambulatory Visit: Payer: Self-pay

## 2018-10-18 ENCOUNTER — Observation Stay (HOSPITAL_COMMUNITY)
Admission: EM | Admit: 2018-10-18 | Discharge: 2018-10-20 | Disposition: A | Discharge status: Skilled Nursing Facility | Payer: Medicare Other | Attending: Internal Medicine | Admitting: Internal Medicine

## 2018-10-18 DIAGNOSIS — F0391 Unspecified dementia with behavioral disturbance: Secondary | ICD-10-CM | POA: Diagnosis not present

## 2018-10-18 DIAGNOSIS — K8020 Calculus of gallbladder without cholecystitis without obstruction: Secondary | ICD-10-CM | POA: Diagnosis not present

## 2018-10-18 DIAGNOSIS — I714 Abdominal aortic aneurysm, without rupture, unspecified: Secondary | ICD-10-CM | POA: Diagnosis present

## 2018-10-18 DIAGNOSIS — Z79899 Other long term (current) drug therapy: Secondary | ICD-10-CM | POA: Diagnosis not present

## 2018-10-18 DIAGNOSIS — Z87891 Personal history of nicotine dependence: Secondary | ICD-10-CM | POA: Diagnosis not present

## 2018-10-18 DIAGNOSIS — I10 Essential (primary) hypertension: Secondary | ICD-10-CM | POA: Diagnosis not present

## 2018-10-18 DIAGNOSIS — E119 Type 2 diabetes mellitus without complications: Secondary | ICD-10-CM | POA: Diagnosis not present

## 2018-10-18 DIAGNOSIS — I700 Atherosclerosis of aorta: Secondary | ICD-10-CM | POA: Diagnosis not present

## 2018-10-18 DIAGNOSIS — Z66 Do not resuscitate: Secondary | ICD-10-CM | POA: Insufficient documentation

## 2018-10-18 DIAGNOSIS — R195 Other fecal abnormalities: Secondary | ICD-10-CM

## 2018-10-18 DIAGNOSIS — K623 Rectal prolapse: Secondary | ICD-10-CM | POA: Diagnosis not present

## 2018-10-18 DIAGNOSIS — R456 Violent behavior: Secondary | ICD-10-CM | POA: Diagnosis not present

## 2018-10-18 DIAGNOSIS — E782 Mixed hyperlipidemia: Secondary | ICD-10-CM | POA: Insufficient documentation

## 2018-10-18 DIAGNOSIS — Z8249 Family history of ischemic heart disease and other diseases of the circulatory system: Secondary | ICD-10-CM | POA: Diagnosis not present

## 2018-10-18 DIAGNOSIS — F03918 Unspecified dementia, unspecified severity, with other behavioral disturbance: Secondary | ICD-10-CM | POA: Diagnosis present

## 2018-10-18 DIAGNOSIS — Z951 Presence of aortocoronary bypass graft: Secondary | ICD-10-CM | POA: Diagnosis not present

## 2018-10-18 DIAGNOSIS — I2510 Atherosclerotic heart disease of native coronary artery without angina pectoris: Secondary | ICD-10-CM | POA: Insufficient documentation

## 2018-10-18 DIAGNOSIS — N3000 Acute cystitis without hematuria: Principal | ICD-10-CM | POA: Insufficient documentation

## 2018-10-18 DIAGNOSIS — K921 Melena: Secondary | ICD-10-CM | POA: Insufficient documentation

## 2018-10-18 DIAGNOSIS — K625 Hemorrhage of anus and rectum: Secondary | ICD-10-CM

## 2018-10-18 DIAGNOSIS — R739 Hyperglycemia, unspecified: Secondary | ICD-10-CM | POA: Diagnosis present

## 2018-10-18 DIAGNOSIS — I482 Chronic atrial fibrillation, unspecified: Secondary | ICD-10-CM | POA: Insufficient documentation

## 2018-10-18 DIAGNOSIS — Z794 Long term (current) use of insulin: Secondary | ICD-10-CM | POA: Diagnosis not present

## 2018-10-18 DIAGNOSIS — R410 Disorientation, unspecified: Secondary | ICD-10-CM | POA: Diagnosis not present

## 2018-10-18 DIAGNOSIS — G309 Alzheimer's disease, unspecified: Secondary | ICD-10-CM | POA: Insufficient documentation

## 2018-10-18 DIAGNOSIS — E1165 Type 2 diabetes mellitus with hyperglycemia: Secondary | ICD-10-CM | POA: Diagnosis not present

## 2018-10-18 DIAGNOSIS — Z950 Presence of cardiac pacemaker: Secondary | ICD-10-CM | POA: Insufficient documentation

## 2018-10-18 DIAGNOSIS — R278 Other lack of coordination: Secondary | ICD-10-CM | POA: Diagnosis not present

## 2018-10-18 DIAGNOSIS — F0281 Dementia in other diseases classified elsewhere with behavioral disturbance: Secondary | ICD-10-CM | POA: Diagnosis not present

## 2018-10-18 DIAGNOSIS — N390 Urinary tract infection, site not specified: Secondary | ICD-10-CM

## 2018-10-18 DIAGNOSIS — R2681 Unsteadiness on feet: Secondary | ICD-10-CM | POA: Insufficient documentation

## 2018-10-18 DIAGNOSIS — U071 COVID-19: Secondary | ICD-10-CM | POA: Diagnosis not present

## 2018-10-18 DIAGNOSIS — K5730 Diverticulosis of large intestine without perforation or abscess without bleeding: Secondary | ICD-10-CM | POA: Diagnosis not present

## 2018-10-18 DIAGNOSIS — Z7982 Long term (current) use of aspirin: Secondary | ICD-10-CM | POA: Diagnosis not present

## 2018-10-18 DIAGNOSIS — H0100A Unspecified blepharitis right eye, upper and lower eyelids: Secondary | ICD-10-CM | POA: Diagnosis not present

## 2018-10-18 LAB — COMPREHENSIVE METABOLIC PANEL
ALT: 12 U/L (ref 0–44)
AST: 20 U/L (ref 15–41)
Albumin: 3.7 g/dL (ref 3.5–5.0)
Alkaline Phosphatase: 49 U/L (ref 38–126)
Anion gap: 9 (ref 5–15)
BUN: 22 mg/dL (ref 8–23)
CO2: 29 mmol/L (ref 22–32)
Calcium: 9.3 mg/dL (ref 8.9–10.3)
Chloride: 102 mmol/L (ref 98–111)
Creatinine, Ser: 0.94 mg/dL (ref 0.44–1.00)
GFR calc Af Amer: >60 mL/min (ref >60)
GFR calc non Af Amer: 55 mL/min — ABNORMAL LOW (ref >60)
Glucose, Bld: 221 mg/dL — ABNORMAL HIGH (ref 70–99)
Potassium: 4.3 mmol/L (ref 3.5–5.1)
Sodium: 140 mmol/L (ref 135–145)
Total Bilirubin: 0.6 mg/dL (ref 0.3–1.2)
Total Protein: 7.3 g/dL (ref 6.5–8.1)

## 2018-10-18 LAB — URINALYSIS, ROUTINE W REFLEX MICROSCOPIC
Bacteria, UA: NONE SEEN
Bilirubin Urine: NEGATIVE
Glucose, UA: NEGATIVE mg/dL
Ketones, ur: NEGATIVE mg/dL
Nitrite: POSITIVE — AB
Protein, ur: 100 mg/dL — AB
Specific Gravity, Urine: 1.021 (ref 1.005–1.030)
WBC, UA: >50 WBC/hpf — ABNORMAL HIGH (ref 0–5)
pH: 5 (ref 5.0–8.0)

## 2018-10-18 LAB — URINE CULTURE
Culture: >=100000 — AB
Special Requests: NORMAL

## 2018-10-18 LAB — CBC
HCT: 41.9 % (ref 36.0–46.0)
Hemoglobin: 13.8 g/dL (ref 12.0–15.0)
MCH: 29.2 pg (ref 26.0–34.0)
MCHC: 32.9 g/dL (ref 30.0–36.0)
MCV: 88.6 fL (ref 80.0–100.0)
Platelets: 259 10*3/uL (ref 150–400)
RBC: 4.73 MIL/uL (ref 3.87–5.11)
RDW: 14.7 % (ref 11.5–15.5)
WBC: 8.3 10*3/uL (ref 4.0–10.5)
nRBC: 0 % (ref 0.0–0.2)

## 2018-10-18 LAB — TYPE AND SCREEN
ABO/RH(D): A POS
Antibody Screen: NEGATIVE

## 2018-10-18 LAB — POC OCCULT BLOOD, ED: Fecal Occult Bld: POSITIVE — AB

## 2018-10-18 LAB — LACTIC ACID, PLASMA: Lactic Acid, Venous: 2.4 mmol/L (ref 0.5–1.9)

## 2018-10-18 LAB — PROTIME-INR
INR: 1.1 (ref 0.8–1.2)
Prothrombin Time: 14 seconds (ref 11.4–15.2)

## 2018-10-18 MED ORDER — IOHEXOL 300 MG/ML  SOLN
100.0000 mL | Freq: Once | INTRAMUSCULAR | Status: AC | PRN
  Administered 2018-10-18: 21:00:00 100 mL via INTRAVENOUS

## 2018-10-18 MED ORDER — LORAZEPAM 2 MG/ML IJ SOLN
INTRAMUSCULAR | Status: AC
  Filled 2018-10-18: qty 1

## 2018-10-18 MED ORDER — SODIUM CHLORIDE 0.9 % IV BOLUS
500.0000 mL | Freq: Once | INTRAVENOUS | Status: AC
  Administered 2018-10-18: 500 mL via INTRAVENOUS

## 2018-10-18 MED ORDER — SODIUM CHLORIDE 0.9 % IV SOLN
1.0000 g | Freq: Once | INTRAVENOUS | Status: AC
  Administered 2018-10-19: 1 g via INTRAVENOUS
  Filled 2018-10-18: qty 10

## 2018-10-18 MED ORDER — LORAZEPAM 2 MG/ML IJ SOLN
0.5000 mg | Freq: Once | INTRAMUSCULAR | Status: AC
  Administered 2018-10-18: 20:00:00 0.5 mg via INTRAVENOUS

## 2018-10-18 NOTE — ED Provider Notes (Signed)
[facility]ENC[facility] Provider Note   CSN: [id number] Arrival date & time: 10/18/18  1543     History   Chief Complaint Chief Complaint  Patient presents with  . Rectal Pain    HPI Valerie Carr is a 85 y.o. female.     Patient presents the emergency department by [facility].  Level 5 caveat due to dementia.  Report from EMS was that patient has a possible prolapsed rectum.  Patient cannot give additional history.  Reportedly she has complained of abdominal pain at times.  [facility] ago and was diagnosed with UTI.  Urine culture grew pan-sensitive E. Coli.  She has been receiving keflex for this as well as pyridium at the nursing facility.   Daughter reports + asymptomatic COVID test months ago, completed quarantine. States that the nursing facility was very alarmed about the prolapse/rectal bleeding that occurred earlier       Past Medical History:  Diagnosis Date  . Arteriosclerotic cardiovascular disease (ASCVD)   . Chronic anxiety    and depression  . Dementia (HCC)   . DM type 2 (diabetes mellitus, type 2) (HCC)   . Gallstones    asysmptomatic  . HTN (hypertension)   . Hyperlipidemia   . Sick sinus syndrome [facility](HCC)    s/p PPM    Patient Active Problem List   Diagnosis Date Noted  . Dementia with behavioral disturbance (HCC)   . Type 2 diabetes mellitus without complication, without long-term current use of insulin (HCC)   . Acute lower UTI 09/04/2016  . Acute cystitis without hematuria   . AKI (acute kidney injury) (HCC) 09/03/2016  . SBO (small bowel obstruction) (HCC) 04/18/2016  . Chronic atrial fibrillation (HCC) 05/31/2014  . Cardiac pacemaker in situ 05/31/2014  . Coronary artery disease involving coronary bypass graft of native heart without angina pectoris 05/31/2014  . Carotid artery stenosis, asymptomatic 05/31/2014  . Sick sinus syndrome (HCC) 04/18/2013  . Coronary  atherosclerosis of native coronary artery 01/11/2013  . Essential hypertension, benign 01/11/2013  . Mixed hyperlipidemia 01/11/2013  . Alzheimer's disease (HCC) 09/02/2012  . Bradycardia 01/06/2011    Past Surgical History:  Procedure Laterality Date  . ABDOMINAL SURGERY     12 inches taken out of "rotted bowel"  . APPENDECTOMY    . CORONARY ARTERY BYPASS GRAFT     07-11-2005, triple bypass  . ESOPHAGOGASTRODUODENOSCOPY    . LAPAROTOMY N/A 04/20/2016   Procedure: EXPLORATORY LAPAROTOMY, SMALL BOWEL RESECTION, LYSIS OF ADHESIONS;  Surgeon: Darnell Levelodd Gerkin, MD;  Location: [facility] ORS;  Service: General;  Laterality: N/A;  . PACEMAKER INSERTION  2004   [employer] PPM implanted by Dr Amil AmenEdmunds, generator change by Dr Johney FrameAllred 01/07/11  . PERMANENT PACEMAKER GENERATOR CHANGE N/A 01/07/2011   Procedure: PERMANENT PACEMAKER GENERATOR CHANGE;  Surgeon: Hillis RangeJames Allred, MD;  [facility];  Service: Cardiovascular;  Laterality: N/A;  . TONSILLECTOMY    . TOTAL ABDOMINAL HYSTERECTOMY W/ BILATERAL SALPINGOOPHORECTOMY       OB History   No obstetric history on file.      Home Medications    Prior to Admission medications   Medication Sig Start Date End Date Taking? Authorizing Provider  cephALEXin (KEFLEX) 500 MG capsule Take 1 capsule (500 mg total) by mouth 3 (three) times daily. 10/16/18   Devoria AlbeKnapp, Iva, MD  clindamycin (CLEOCIN) 300 MG capsule Take 1 capsule (300 mg total) by mouth 3 (  three) times daily. 03/31/17   Wynetta Fines, MD  donepezil (ARICEPT) 10 MG tablet TAKE 1 TABLET (10 MG TOTAL) BY MOUTH AT BEDTIME. 05/20/16   Marvel Plan, MD  DULoxetine (CYMBALTA) 60 MG capsule Take 60 mg by mouth daily.     [provider]  memantine (NAMENDA) 10 MG tablet Take 1 tablet (10 mg total) by mouth 2 (two) times daily. 01/11/13   Ronal Fear, NP  metFORMIN (GLUCOPHAGE-XR) 500 MG 24 hr tablet Take 500 mg by mouth daily with breakfast.      [provider]  ONE TOUCH ULTRA TEST  test strip TEST DAILY DX E11.51 06/19/14   [provider]  QUEtiapine (SEROQUEL) 25 MG tablet Take 1 tablet (25 mg total) by mouth every morning. 11/08/15   Marvel Plan, MD  rosuvastatin (CRESTOR) 20 MG tablet Take 20 mg by mouth daily. 03/21/16   [provider]  traZODone (DESYREL) 100 MG tablet Take 100 mg by mouth at bedtime.      [provider]  valsartan (DIOVAN) 160 MG tablet Take 160 mg by mouth daily.    [provider]    Family History Family History  Problem Relation Age of Onset  . Diabetes Mother   . Hypertension Mother   . Coronary artery disease Father   . Diabetes Brother   . Throat cancer Brother   . Diabetes Brother   . Hypertension Sister   . Cancer Son        esophageal    Social History Social History   Tobacco Use  . Smoking status: Former Smoker    Packs/day: 1.00    Years: 25.00    Pack years: 25.00    Quit date: 2001    Years since quitting: 19.7  . Smokeless tobacco: Former Engineer, water Use Topics  . Alcohol use: No  . Drug use: No     Allergies   Clarithromycin, Codeine phosphate, Effexor [venlafaxine hydrochloride], Fexofenadine hcl, Lisinopril, Prozac [fluoxetine hcl], Septra [bactrim], and Wellbutrin [bupropion hcl]   Review of Systems Review of Systems  Unable to perform ROS: Dementia     Physical Exam Updated Vital Signs BP 133/73   Pulse 87   Temp 98.1 F (36.7 C) (Oral)   Resp 18   SpO2 97%   Physical Exam Vitals signs and nursing note reviewed.  Constitutional:      Appearance: She is well-developed.  HENT:     Head: Normocephalic and atraumatic.  Eyes:     General:        Right eye: No discharge.        Left eye: No discharge.     Conjunctiva/sclera: Conjunctivae normal.  Neck:     Musculoskeletal: Normal range of motion and neck supple.  Cardiovascular:     Rate and Rhythm: Normal rate and regular rhythm.     Heart sounds: Normal heart sounds.  Pulmonary:     Effort:  Pulmonary effort is normal.     Breath sounds: Normal breath sounds.  Abdominal:     Palpations: Abdomen is soft.     Tenderness: There is no abdominal tenderness.  Genitourinary:    Rectum: Tenderness present. No mass, external hemorrhoid or internal hemorrhoid.     Comments: No prolapsed rectum noted on external exam.  Patient with dark to moderately red blood noted on skin around rectum without active bleeding.  No visible external hemorrhoids.  Only small amt of stool in rectum, red-tinged. + tenderness.  Skin:    General: Skin is warm and dry.  Neurological:     Mental Status: She is alert. Mental status is at baseline.     Comments: Patient is pleasantly confused.  Baseline dementia.      ED Treatments / Results  Labs (all labs ordered are listed, but only abnormal results are displayed) Labs Reviewed  COMPREHENSIVE METABOLIC PANEL - Abnormal; Notable for the following components:      Result Value   Glucose, Bld 221 (*)    GFR calc non Af Amer 55 (*)    All other components within normal limits  LACTIC ACID, PLASMA - Abnormal; Notable for the following components:   Lactic Acid, Venous 2.4 (*)    All other components within normal limits  URINALYSIS, ROUTINE W REFLEX MICROSCOPIC - Abnormal; Notable for the following components:   Color, Urine AMBER (*)    APPearance CLOUDY (*)    Hgb urine dipstick MODERATE (*)    Protein, ur 100 (*)    Nitrite POSITIVE (*)    Leukocytes,Ua SMALL (*)    WBC, UA >50 (*)    Non Squamous Epithelial 0-5 (*)    All other components within normal limits  POC OCCULT BLOOD, ED - Abnormal; Notable for the following components:   Fecal Occult Bld POSITIVE (*)    All other components within normal limits  SARS CORONAVIRUS 2 (TAT 6-24 HRS)  CULTURE, BLOOD (ROUTINE X 2)  CULTURE, BLOOD (ROUTINE X 2)  CBC  PROTIME-INR  TYPE AND SCREEN     EKG None  Radiology Ct Abdomen Pelvis W Contrast  Result Date: 10/18/2018 CLINICAL DATA:   85 year old female with abdominal pain and rectal bleeding. EXAM: CT ABDOMEN AND PELVIS WITH CONTRAST TECHNIQUE: Multidetector CT imaging of the abdomen and pelvis was performed using the standard protocol following bolus administration of intravenous contrast. CONTRAST:  OMNIPAQUE IOHEXOL 300 MG/ML  SOLN COMPARISON:  CT of the abdomen pelvis dated 04/18/2016 FINDINGS: Lower chest: Partially visualized subsegmental atelectasis in the right middle lobe and lingula. Multi vessel coronary vascular calcification and postsurgical changes of CABG and pacemaker wires noted. No intra-abdominal free air or free fluid. Hepatobiliary: The liver is unremarkable. There is mild intrahepatic biliary ductal dilatation. There is a 17 mm stone in the gallbladder. No pericholecystic fluid or evidence of acute cholecystitis by CT. Pancreas: Unremarkable. No pancreatic ductal dilatation or surrounding inflammatory changes. Spleen: Normal in size without focal abnormality. Adrenals/Urinary Tract: The right adrenal gland is unremarkable. There is mild left adrenal nodularity. There is no hydronephrosis on either side. There is symmetric enhancement and excretion of contrast by both kidneys. The visualized ureters and urinary bladder appear unremarkable. Stomach/Bowel: There is sigmoid diverticulosis without active inflammatory changes. Postsurgical changes of partial small bowel resection with anastomotic suture in the pelvis. There is no evidence of bowel obstruction or active inflammation. The appendix is not visualized with certainty. No inflammatory changes identified in the right lower quadrant. Vascular/Lymphatic: Advanced aortoiliac atherosclerotic disease. There are multiple abdominal aortic aneurysms. There is a 4.5 cm aneurysmal dilatation of the aorta at the diaphragmatic hiatus (previously 4.0 cm). And small dilatation of the suprarenal aorta at the level of the SMA measuring up to 4.1 cm in greatest diameter (series 3,  image 25) similar to prior CT. There is a fusiform, partially thrombosed, infrarenal abdominal aortic aneurysm measuring approximately 4.4 cm in greatest diameter (series 7 image 88) which has increased in size since the prior CT when it measured  approximately 4 cm by my measurement. Evaluation of the aneurysm is limited as precontrast images are not provided. Linear high attenuating bands within thrombosed lumen of the aneurysm appear new since the prior CT. These may represent areas of calcification although new mural hemorrhages are not excluded. There is no extravasation of contrast outside of the confines of the aorta. No periaortic inflammatory changes or fluid collection. The origins of the celiac axis, SMA are patent. There is a retroaortic left renal vein anatomy. The IVC is unremarkable. No portal venous gas. There is no adenopathy. Reproductive: Hysterectomy. No pelvic mass. Other: Midline vertical anterior abdominal wall incisional scar. Musculoskeletal: Osteopenia with degenerative changes of the spine. No acute osseous pathology. IMPRESSION: 1. Interval increase in the size of the infrarenal abdominal aortic aneurysm measuring up to 4.4 cm in greatest diameter since the prior CT. Linear high attenuating bands within the thrombosed lumen of the aneurysm appear new since the prior CT and may represent areas of calcification although new mural hemorrhages are not excluded. No extravasation of contrast outside of the confines of the aorta. Recommend followup by abdomen and pelvis CTA in 6 months, and vascular surgery referral/consultation if not already obtained. This recommendation follows ACR consensus guidelines: White Paper of the ACR Incidental Findings Committee II on Vascular Findings. J Am Coll Radiol 2013; 10:789-794. Aortic aneurysm NOS (ICD10-I71.9) 2. Cholelithiasis. 3. Sigmoid diverticulosis. No bowel obstruction or active inflammation. Aortic Atherosclerosis (ICD10-I70.0). Electronically  Signed   By: Elgie Collard M.D.   On: 10/18/2018 22:27    Procedures Procedures (including critical care time)  Medications Ordered in ED Medications  cefTRIAXone (ROCEPHIN) 1 g in sodium chloride 0.9 % 100 mL IVPB (has no administration in time range)  LORazepam (ATIVAN) injection 0.5 mg (0.5 mg Intravenous Given 10/18/18 2026)  iohexol (OMNIPAQUE) 300 MG/ML solution 100 mL (100 mLs Intravenous Contrast Given 10/18/18 2042)  sodium chloride 0.9 % bolus 500 mL (500 mLs Intravenous New Bag/Given 10/18/18 2323)     Initial Impression / Assessment and Plan / ED Course  I have reviewed the triage vital signs and the nursing notes.  Pertinent labs & imaging results that were available during my care of the patient were reviewed by me and considered in my medical decision making (see chart for details).        Patient seen and examined. Exam is reassuring. No severe abd pain, no rectal prolapse. Urine is orange-red. Part of the coloration is from pyridium but will need UA to determine if there is any blood. Possible blood from GI tract, awaiting hemoccult. Given that patient is poor historian with reported abdominal pain, possible GI bleeding, will obtain CT imaging.  Hemoglobin is stable today.  Patient is hemodynamically stable.  Vital signs reviewed and are as follows: BP 133/73   Pulse 87   Temp 98.1 F (36.7 C) (Oral)   Resp 18   SpO2 97%   Hemoccult +.  CT scan reviewed.  Patient has slightly enlarging abdominal aneurysm since 03/2016 without signs of rupture.  Recommendation is to have these followed up by vascular surgery and repeat CT in 6 months.  Daughter updated.   At this point the patient has 2 problems:  She is a diabetic with UTI status post 3 days of treatment without much improvement.  She grew out pansensitive E. coli on culture.  She is slightly more agitated and confused than her baseline.  Lactate was 2.4.  Normal white count, no fever.  Patient had some  amount of GI bleeding in relation to a questionable rectal prolapse.  If she did have a rectal prolapse, this is now reduced.  No blood thinners.  Heme positive stool.  Discussed admission versus discharge back to facility with patient's daughter.  She would prefer admission for monitoring at this point in time.  Will discuss with hospitalist.  Blood cultures and IV Rocephin ordered.  I discussed case with Dr. Myna Hidalgo of [facility].  He will see patient and admit for observation for above conditions.  Final Clinical Impressions(s) / ED Diagnoses   Final diagnoses:  Acute cystitis without hematuria  Heme positive stool  Hyperglycemia  Abdominal aortic aneurysm (AAA) 3.0 cm to 5.0 cm in diameter in female [facility])   Admit for above.   ED Discharge Orders    None       Carlisle Cater, Hershal Coria 10/19/18 0004    Veryl Speak, MD 10/19/18 1536

## 2018-10-18 NOTE — ED Notes (Addendum)
Brandy (DON) at [facility] for status updates [phone]

## 2018-10-18 NOTE — ED Triage Notes (Signed)
Pt from [facility] facility for a prolapsed rectum and is suppose to have surgery. Pt alert and oriented to self and baseline, ems reports pt can get combative at times. Pt has no complaints at this time.

## 2018-10-18 NOTE — H&P (Signed)
History and Physical    Valerie Carr RSW:[id number] DOB: 08/24/1929 DOA: 10/18/2018  PCP: Harlan Stains, MD   Patient coming from: SNF   Chief Complaint: Rectal bleeding   HPI: Valerie Carr is a 85 y.o. female with medical history significant for advanced dementia with behavioral disturbance, sick sinus syndrome with pacer, insulin-dependent diabetes mellitus, coronary artery disease, and atrial fibrillation not anticoagulated, now presenting to the emergency department from her SNF for evaluation of rectal bleeding and possible rectal prolapse.  Patient is unable to contribute much to the history due to her dementia but had reportedly been complaining of abdominal pain intermittently for several days, was diagnosed with UTI a few days ago, started on Keflex, and culture is growing pansensitive E. coli.  She was noted by SNF personnel today to have rectal bleeding and a saw something concerning for possible prolapse, and patient was sent to the ED.  ED Course: Upon arrival to the ED, patient is found to be afebrile, saturating well on room air, and with stable blood pressure.  Chemistry panel features a serum glucose of 221 and CBC is unremarkable.  Lactic acid is 2.4.  Fecal occult blood testing is positive.  CT of the abdomen and pelvis demonstrates interval increase in infrarenal AAA with calcification versus mural hemorrhage.  Blood cultures and COVID-19 screening test were ordered, 500 cc normal saline was administered, patient was treated with Rocephin, and hospitalist consulted for admission.  Review of Systems:  Unable to complete ROS secondary the patient's clinical condition.  Past Medical History:  Diagnosis Date  . Arteriosclerotic cardiovascular disease (ASCVD)   . Chronic anxiety    and depression  . Dementia ([place])   . DM type 2 (diabetes mellitus, type 2) ([place])   . Gallstones    asysmptomatic  . HTN (hypertension)   . Hyperlipidemia   . Sick sinus syndrome ([facility])     s/p PPM    Past Surgical History:  Procedure Laterality Date  . ABDOMINAL SURGERY     12 inches taken out of "rotted bowel"  . APPENDECTOMY    . CORONARY ARTERY BYPASS GRAFT     07-11-2005, triple bypass  . ESOPHAGOGASTRODUODENOSCOPY    . LAPAROTOMY N/A 04/20/2016   Procedure: EXPLORATORY LAPAROTOMY, SMALL BOWEL RESECTION, LYSIS OF ADHESIONS;  Surgeon: Armandina Gemma, MD;  Location: [facility];  Service: General;  Laterality: N/A;  . PACEMAKER INSERTION  2004   [employer] PPM implanted by Dr Leonia Reeves, generator change by Dr Rayann Heman 01/07/11  . PERMANENT PACEMAKER GENERATOR CHANGE N/A 01/07/2011   Procedure: PERMANENT PACEMAKER GENERATOR CHANGE;  Surgeon: Thompson Grayer, MD;  Location[facility] CATH LAB;  Service: Cardiovascular;  Laterality: N/A;  . TONSILLECTOMY    . TOTAL ABDOMINAL HYSTERECTOMY W/ BILATERAL SALPINGOOPHORECTOMY       reports that she quit smoking about 19 years ago. She has a 25.00 pack-year smoking history. She has quit using smokeless tobacco. She reports that she does not drink alcohol or use drugs.  Allergies  Allergen Reactions  . Clarithromycin Other (See Comments)    unknown  . Codeine Phosphate Other (See Comments)    unknown  . Effexor [Venlafaxine Hydrochloride] Other (See Comments)    unknown  . Fexofenadine Hcl Other (See Comments)    unknown  . Lisinopril Other (See Comments)    unknown  . Prozac [Fluoxetine Hcl] Other (See Comments)    unknown  . Septra [Bactrim] Other (See Comments)    unknown  . Wellbutrin [Bupropion Hcl]  Other (See Comments)    unknown    Family History  Problem Relation Age of Onset  . Diabetes Mother   . Hypertension Mother   . Coronary artery disease Father   . Diabetes Brother   . Throat cancer Brother   . Diabetes Brother   . Hypertension Sister   . Cancer Son        esophageal     Prior to Admission medications   Medication Sig Start Date End Date Taking? Authorizing Provider  aspirin 325 MG tablet Take 325 mg  by mouth every morning.   Yes [provider]  bacitracin-polymyxin b (POLYSPORIN) ophthalmic ointment Place 1 application into the right eye every 12 (twelve) hours. apply to eye every 12 hours while awake   Yes [provider]  Cholecalciferol (VITAMIN D3) 50 MCG (2000 UT) TABS Take 2,000 Units by mouth every morning.   Yes [provider]  divalproex (DEPAKOTE SPRINKLE) 125 MG capsule Take 125-250 mg by mouth See admin instructions. Take one capsule (125 mg) by mouth every morning and two capsules (250 mg) at bedtime - for behaviors   Yes [provider]  donepezil (ARICEPT) 10 MG tablet TAKE 1 TABLET (10 MG TOTAL) BY MOUTH AT BEDTIME. Patient taking differently: Take 10 mg by mouth at bedtime.  05/20/16  Yes Marvel PlanXu, Jindong, MD  DULoxetine (CYMBALTA) 60 MG capsule Take 60 mg by mouth every morning. For depression   Yes [provider]  insulin detemir (LEVEMIR) 100 UNIT/ML injection Inject 15 Units into the skin at bedtime.   Yes [provider]  insulin lispro (HUMALOG) 100 UNIT/ML injection Inject 2 Units into the skin 3 (three) times daily before meals.   Yes [provider]  memantine (NAMENDA) 10 MG tablet Take 1 tablet (10 mg total) by mouth 2 (two) times daily. Patient taking differently: Take 10 mg by mouth 2 (two) times daily with a meal.  01/11/13  Yes Lam, Tawny AsalLynn E, NP  metFORMIN (GLUCOPHAGE) 500 MG tablet Take 500 mg by mouth 2 (two) times daily with a meal.   Yes [provider]  omeprazole (PRILOSEC) 20 MG capsule Take 20 mg by mouth daily at 6 (six) AM.   Yes [provider]  phenazopyridine (PYRIDIUM) 200 MG tablet Take 200 mg by mouth 3 (three) times daily.   Yes [provider]  senna-docusate (SENOKOT S) 8.6-50 MG tablet Take 1 tablet by mouth 2 (two) times daily with a meal.   Yes [provider]  traZODone (DESYREL) 50 MG tablet Take 50 mg by mouth at bedtime.    Yes [provider]  cephALEXin (KEFLEX) 500 MG capsule Take 1 capsule (500 mg total) by mouth 3 (three) times daily. 10/16/18   Devoria AlbeKnapp, Iva, MD  [facility] test strip TEST DAILY DX E11.51 06/19/14   [provider]    Physical Exam: Vitals:   10/18/18 1606 10/18/18 1636 10/18/18 1637 10/18/18 1645  BP: (!) 151/83 109/88  133/73  Pulse: 94  92 87  Resp: 18     Temp: 98.1 F (36.7 C)     TempSrc: Oral     SpO2: 97%  100% 97%    Constitutional: NAD, calm  Eyes: PERTLA, lids and conjunctivae normal ENMT: Mucous membranes are moist. Posterior pharynx clear of any exudate or lesions.   Neck: normal, supple, no masses, no thyromegaly Respiratory: clear to auscultation bilaterally, no wheezing, no crackles. No accessory muscle use.  Cardiovascular:  S1 & S2 heard, irregularly irregular. No extremity edema.   Abdomen: No distension, no tenderness, soft. Bowel sounds active.  Musculoskeletal: no clubbing / cyanosis. No joint deformity upper and lower extremities.   Skin: no significant rashes, lesions, ulcers. Warm, dry, well-perfused. Neurologic: No gross facial asymmetry. Sensation intact. Moving all extremities.  Psychiatric: Alert and oriented to person only. Calm, cooperative.    Labs on Admission: I have personally reviewed following labs and imaging studies  CBC: Recent Labs  Lab 10/16/18 0137 10/18/18 1616  WBC 7.5 8.3  NEUTROABS 4.5  --   HGB 12.0 13.8  HCT 38.3 41.9  MCV 88.9 88.6  PLT 225 259   Basic Metabolic Panel: Recent Labs  Lab 10/16/18 0137 10/18/18 1616  NA 139 140  K 3.6 4.3  CL 101 102  CO2 29 29  GLUCOSE 127* 221*  BUN 26* 22  CREATININE 0.73 0.94  CALCIUM 9.2 9.3   GFR: Estimated Creatinine Clearance: 39.5 mL/min (by C-G formula based on SCr of 0.94 mg/dL). Liver Function Tests: Recent Labs  Lab 10/18/18 1616  AST 20  ALT 12  ALKPHOS 49  BILITOT 0.6  PROT 7.3  ALBUMIN 3.7   No results for input(s): LIPASE, AMYLASE in the  last 168 hours. No results for input(s): AMMONIA in the last 168 hours. Coagulation Profile: Recent Labs  Lab 10/18/18 1654  INR 1.1   Cardiac Enzymes: No results for input(s): CKTOTAL, CKMB, CKMBINDEX, TROPONINI in the last 168 hours. BNP (last 3 results) No results for input(s): PROBNP in the last 8760 hours. HbA1C: No results for input(s): HGBA1C in the last 72 hours. CBG: No results for input(s): GLUCAP in the last 168 hours. Lipid Profile: No results for input(s): CHOL, HDL, LDLCALC, TRIG, CHOLHDL, LDLDIRECT in the last 72 hours. Thyroid Function Tests: No results for input(s): TSH, T4TOTAL, FREET4, T3FREE, THYROIDAB in the last 72 hours. Anemia Panel: No results for input(s): VITAMINB12, FOLATE, FERRITIN, TIBC, IRON, RETICCTPCT in the last 72 hours. Urine analysis:    Component Value Date/Time   COLORURINE AMBER (A) 10/18/2018 1730   APPEARANCEUR CLOUDY (A) 10/18/2018 1730   LABSPEC 1.021 10/18/2018 1730   PHURINE 5.0 10/18/2018 1730   GLUCOSEU NEGATIVE 10/18/2018 1730   HGBUR MODERATE (A) 10/18/2018 1730   BILIRUBINUR NEGATIVE 10/18/2018 1730   KETONESUR NEGATIVE 10/18/2018 1730   PROTEINUR 100 (A) 10/18/2018 1730   UROBILINOGEN 1.0 05/26/2013 1648   NITRITE POSITIVE (A) 10/18/2018 1730   LEUKOCYTESUR SMALL (A) 10/18/2018 1730   Sepsis Labs: @LABRCNTIP (procalcitonin:4,lacticidven:4) ) Recent Results (from the past 240 hour(s))  Urine culture     Status: Abnormal   Collection Time: 10/16/18  3:47 AM   Specimen: Urine, Clean Catch  Result Value Ref Range Status   Specimen Description   Final    URINE, CLEAN CATCH Performed at [facility], [address] [place], [place] [address]    Special Requests   Final    Normal Performed at [facility], [address] [place], [place] [address]    Culture >=100,000 COLONIES/mL ESCHERICHIA COLI (A)  Final   Report Status 10/18/2018 FINAL  Final   Organism ID, Bacteria ESCHERICHIA COLI (A)  Final      Susceptibility    Escherichia coli - MIC*    AMPICILLIN <=2 SENSITIVE Sensitive     CEFAZOLIN <=4 SENSITIVE Sensitive     CEFTRIAXONE <=1 SENSITIVE Sensitive     CIPROFLOXACIN <=0.25 SENSITIVE Sensitive     GENTAMICIN <=1 SENSITIVE Sensitive  IMIPENEM <=0.25 SENSITIVE Sensitive     NITROFURANTOIN <=16 SENSITIVE Sensitive     TRIMETH/SULFA <=20 SENSITIVE Sensitive     AMPICILLIN/SULBACTAM <=2 SENSITIVE Sensitive     PIP/TAZO <=4 SENSITIVE Sensitive     Extended ESBL NEGATIVE Sensitive     * >=100,000 COLONIES/mL ESCHERICHIA COLI     Radiological Exams on Admission: Ct Abdomen Pelvis W Contrast  Result Date: 10/18/2018 CLINICAL DATA:  85 year old female with abdominal pain and rectal bleeding. EXAM: CT ABDOMEN AND PELVIS WITH CONTRAST TECHNIQUE: Multidetector CT imaging of the abdomen and pelvis was performed using the standard protocol following bolus administration of intravenous contrast. CONTRAST:  OMNIPAQUE IOHEXOL 300 MG/ML  SOLN COMPARISON:  CT of the abdomen pelvis dated 04/18/2016 FINDINGS: Lower chest: Partially visualized subsegmental atelectasis in the right middle lobe and lingula. Multi vessel coronary vascular calcification and postsurgical changes of CABG and pacemaker wires noted. No intra-abdominal free air or free fluid. Hepatobiliary: The liver is unremarkable. There is mild intrahepatic biliary ductal dilatation. There is a 17 mm stone in the gallbladder. No pericholecystic fluid or evidence of acute cholecystitis by CT. Pancreas: Unremarkable. No pancreatic ductal dilatation or surrounding inflammatory changes. Spleen: Normal in size without focal abnormality. Adrenals/Urinary Tract: The right adrenal gland is unremarkable. There is mild left adrenal nodularity. There is no hydronephrosis on either side. There is symmetric enhancement and excretion of contrast by both kidneys. The visualized ureters and urinary bladder appear unremarkable. Stomach/Bowel: There is sigmoid  diverticulosis without active inflammatory changes. Postsurgical changes of partial small bowel resection with anastomotic suture in the pelvis. There is no evidence of bowel obstruction or active inflammation. The appendix is not visualized with certainty. No inflammatory changes identified in the right lower quadrant. Vascular/Lymphatic: Advanced aortoiliac atherosclerotic disease. There are multiple abdominal aortic aneurysms. There is a 4.5 cm aneurysmal dilatation of the aorta at the diaphragmatic hiatus (previously 4.0 cm). And small dilatation of the suprarenal aorta at the level of the SMA measuring up to 4.1 cm in greatest diameter (series 3, image 25) similar to prior CT. There is a fusiform, partially thrombosed, infrarenal abdominal aortic aneurysm measuring approximately 4.4 cm in greatest diameter (series 7 image 88) which has increased in size since the prior CT when it measured approximately 4 cm by my measurement. Evaluation of the aneurysm is limited as precontrast images are not provided. Linear high attenuating bands within thrombosed lumen of the aneurysm appear new since the prior CT. These may represent areas of calcification although new mural hemorrhages are not excluded. There is no extravasation of contrast outside of the confines of the aorta. No periaortic inflammatory changes or fluid collection. The origins of the celiac axis, SMA are patent. There is a retroaortic left renal vein anatomy. The IVC is unremarkable. No portal venous gas. There is no adenopathy. Reproductive: Hysterectomy. No pelvic mass. Other: Midline vertical anterior abdominal wall incisional scar. Musculoskeletal: Osteopenia with degenerative changes of the spine. No acute osseous pathology. IMPRESSION: 1. Interval increase in the size of the infrarenal abdominal aortic aneurysm measuring up to 4.4 cm in greatest diameter since the prior CT. Linear high attenuating bands within the thrombosed lumen of the aneurysm  appear new since the prior CT and may represent areas of calcification although new mural hemorrhages are not excluded. No extravasation of contrast outside of the confines of the aorta. Recommend followup by abdomen and pelvis CTA in 6 months, and vascular surgery referral/consultation if not already obtained. This recommendation follows ACR consensus  guidelines: White Paper of the ACR Incidental Findings Committee II on Vascular Findings. J Am Coll Radiol 2013; 10:789-794. Aortic aneurysm NOS (ICD10-I71.9) 2. Cholelithiasis. 3. Sigmoid diverticulosis. No bowel obstruction or active inflammation. Aortic Atherosclerosis (ICD10-I70.0). Electronically Signed   By: Elgie Collard M.D.   On: 10/18/2018 22:27    EKG: Not performed.    Assessment/Plan   1. Rectal bleeding  - Sent from SNF for rectal bleeding, possible prolapse, and abdominal pain  - She is hemodynamically stable with normal Hgb, red blood noted on DRE, and diverticulosis without diverticulitis on CT  - Plan to hold ASA, type and screen, trend CBC    2. Atrial fibrillation  - CHADS-VASc at least 5 (age x2, gender, CAD, DM)  - Status-post Maze procedure, not felt to be a candidate for anticoagulation  - Hold ASA initially as above   3. CAD - No anginal complaints  - Hold ASA as above   4. Dementia  - Continue Aricept, Namenda, Depakote    5. Insulin-dependent DM  - No recent A1c on file  - Continue glycemic-control with insulin    6. AAA  - Interval increase in size to 4.4 cm with calcifications vs mural hemorrhages noted on CT abd/pelvis in ED  - Follow-up imaging recommended    7. UTI  - Recently diagnosed with UTI and started on Keflex  - She is not septic, culture growing pan-sensitive E coli, continue Keflex     PPE: Mask, face shield  DVT prophylaxis: SCD's  Code Status: DNR  Family Communication: Daughter updated at bedside Consults called: None  Admission status: Observation     Briscoe Deutscher, MD  [facility] Pager [phone]  If 7PM-7AM, please contact night-coverage [email or web address] Password [facility]  10/18/2018, 11:47 PM

## 2018-10-19 ENCOUNTER — Observation Stay (HOSPITAL_COMMUNITY): Payer: Medicare Other

## 2018-10-19 ENCOUNTER — Telehealth (HOSPITAL_BASED_OUTPATIENT_CLINIC_OR_DEPARTMENT_OTHER): Payer: Self-pay

## 2018-10-19 DIAGNOSIS — R739 Hyperglycemia, unspecified: Secondary | ICD-10-CM | POA: Insufficient documentation

## 2018-10-19 DIAGNOSIS — U071 COVID-19: Secondary | ICD-10-CM | POA: Insufficient documentation

## 2018-10-19 DIAGNOSIS — N3000 Acute cystitis without hematuria: Secondary | ICD-10-CM | POA: Diagnosis not present

## 2018-10-19 DIAGNOSIS — K625 Hemorrhage of anus and rectum: Secondary | ICD-10-CM | POA: Diagnosis not present

## 2018-10-19 DIAGNOSIS — R195 Other fecal abnormalities: Secondary | ICD-10-CM | POA: Insufficient documentation

## 2018-10-19 DIAGNOSIS — F0391 Unspecified dementia with behavioral disturbance: Secondary | ICD-10-CM | POA: Diagnosis not present

## 2018-10-19 LAB — GLUCOSE, CAPILLARY
Glucose-Capillary: 142 mg/dL — ABNORMAL HIGH (ref 70–99)
Glucose-Capillary: 156 mg/dL — ABNORMAL HIGH (ref 70–99)
Glucose-Capillary: 214 mg/dL — ABNORMAL HIGH (ref 70–99)

## 2018-10-19 LAB — BASIC METABOLIC PANEL
Anion gap: 9 (ref 5–15)
BUN: 17 mg/dL (ref 8–23)
CO2: 28 mmol/L (ref 22–32)
Calcium: 8.9 mg/dL (ref 8.9–10.3)
Chloride: 103 mmol/L (ref 98–111)
Creatinine, Ser: 0.92 mg/dL (ref 0.44–1.00)
GFR calc Af Amer: >60 mL/min (ref >60)
GFR calc non Af Amer: 56 mL/min — ABNORMAL LOW (ref >60)
Glucose, Bld: 163 mg/dL — ABNORMAL HIGH (ref 70–99)
Potassium: 3.9 mmol/L (ref 3.5–5.1)
Sodium: 140 mmol/L (ref 135–145)

## 2018-10-19 LAB — CBC
HCT: 40.6 % (ref 36.0–46.0)
Hemoglobin: 13.4 g/dL (ref 12.0–15.0)
MCH: 29.1 pg (ref 26.0–34.0)
MCHC: 33 g/dL (ref 30.0–36.0)
MCV: 88.1 fL (ref 80.0–100.0)
Platelets: 211 10*3/uL (ref 150–400)
RBC: 4.61 MIL/uL (ref 3.87–5.11)
RDW: 14.7 % (ref 11.5–15.5)
WBC: 7 10*3/uL (ref 4.0–10.5)
nRBC: 0 % (ref 0.0–0.2)

## 2018-10-19 LAB — CBG MONITORING, ED
Glucose-Capillary: 166 mg/dL — ABNORMAL HIGH (ref 70–99)
Glucose-Capillary: 144 mg/dL — ABNORMAL HIGH (ref 70–99)

## 2018-10-19 LAB — LACTIC ACID, PLASMA: Lactic Acid, Venous: 1.1 mmol/L (ref 0.5–1.9)

## 2018-10-19 LAB — MAGNESIUM: Magnesium: 2 mg/dL (ref 1.7–2.4)

## 2018-10-19 LAB — SARS CORONAVIRUS 2 (TAT 6-24 HRS): SARS Coronavirus 2: POSITIVE — AB

## 2018-10-19 LAB — D-DIMER, QUANTITATIVE: D-Dimer, Quant: 2.55 ug/mL-FEU — ABNORMAL HIGH (ref 0.00–0.50)

## 2018-10-19 LAB — C-REACTIVE PROTEIN: CRP: <0.8 mg/dL (ref <1.0)

## 2018-10-19 LAB — FERRITIN: Ferritin: 43 ng/mL (ref 11–307)

## 2018-10-19 MED ORDER — SODIUM CHLORIDE 0.9% IV SOLUTION: Freq: Once | INTRAVENOUS | Status: DC

## 2018-10-19 MED ORDER — DONEPEZIL HCL 10 MG PO TABS
10.0000 mg | ORAL_TABLET | Freq: Every day | ORAL | Status: DC
  Administered 2018-10-19: 10 mg via ORAL
  Filled 2018-10-19 (×2): qty 1

## 2018-10-19 MED ORDER — HYDROMORPHONE HCL 1 MG/ML IJ SOLN
0.5000 mg | INTRAMUSCULAR | Status: DC | PRN
  Administered 2018-10-19: 0.5 mg via INTRAVENOUS
  Filled 2018-10-19: qty 1

## 2018-10-19 MED ORDER — CEPHALEXIN 500 MG PO CAPS
500.0000 mg | ORAL_CAPSULE | Freq: Three times a day (TID) | ORAL | Status: DC
  Administered 2018-10-19 – 2018-10-20 (×3): 500 mg via ORAL
  Filled 2018-10-19 (×2): qty 1
  Filled 2018-10-19: qty 2
  Filled 2018-10-19: qty 1

## 2018-10-19 MED ORDER — SODIUM CHLORIDE 0.9 % IV SOLN: 250.0000 mL | INTRAVENOUS | Status: DC | PRN

## 2018-10-19 MED ORDER — HYDROCODONE-ACETAMINOPHEN 5-325 MG PO TABS
1.0000 | ORAL_TABLET | Freq: Four times a day (QID) | ORAL | Status: DC | PRN
  Administered 2018-10-19 – 2018-10-20 (×2): 1 via ORAL
  Filled 2018-10-19 (×2): qty 1

## 2018-10-19 MED ORDER — TRAZODONE HCL 50 MG PO TABS
50.0000 mg | ORAL_TABLET | Freq: Every day | ORAL | Status: DC
  Administered 2018-10-19: 50 mg via ORAL
  Filled 2018-10-19: qty 1

## 2018-10-19 MED ORDER — DIVALPROEX SODIUM 125 MG PO CSDR
125.0000 mg | DELAYED_RELEASE_CAPSULE | Freq: Every day | ORAL | Status: DC
  Administered 2018-10-20: 125 mg via ORAL
  Filled 2018-10-19: qty 1

## 2018-10-19 MED ORDER — DULOXETINE HCL 60 MG PO CPEP
60.0000 mg | ORAL_CAPSULE | Freq: Every morning | ORAL | Status: DC
  Administered 2018-10-20: 60 mg via ORAL
  Filled 2018-10-19 (×2): qty 1

## 2018-10-19 MED ORDER — INSULIN DETEMIR 100 UNIT/ML ~~LOC~~ SOLN
5.0000 [IU] | Freq: Every day | SUBCUTANEOUS | Status: DC
  Filled 2018-10-19: qty 0.05

## 2018-10-19 MED ORDER — SODIUM CHLORIDE 0.9% FLUSH: 3.0000 mL | INTRAVENOUS | Status: DC | PRN

## 2018-10-19 MED ORDER — ONDANSETRON HCL 4 MG/2ML IJ SOLN: 4.0000 mg | Freq: Four times a day (QID) | INTRAMUSCULAR | Status: DC | PRN

## 2018-10-19 MED ORDER — DIVALPROEX SODIUM 125 MG PO CSDR
250.0000 mg | DELAYED_RELEASE_CAPSULE | Freq: Every day | ORAL | Status: DC
  Administered 2018-10-19: 250 mg via ORAL
  Filled 2018-10-19 (×3): qty 2

## 2018-10-19 MED ORDER — ACETAMINOPHEN 325 MG PO TABS: 650.0000 mg | ORAL_TABLET | Freq: Four times a day (QID) | ORAL | Status: DC | PRN

## 2018-10-19 MED ORDER — PANTOPRAZOLE SODIUM 40 MG IV SOLR
40.0000 mg | Freq: Two times a day (BID) | INTRAVENOUS | Status: DC
  Administered 2018-10-19 – 2018-10-20 (×2): 40 mg via INTRAVENOUS
  Filled 2018-10-19 (×2): qty 40

## 2018-10-19 MED ORDER — INSULIN ASPART 100 UNIT/ML ~~LOC~~ SOLN
0.0000 [IU] | SUBCUTANEOUS | Status: DC
  Administered 2018-10-19: 07:00:00 2 [IU] via SUBCUTANEOUS

## 2018-10-19 MED ORDER — ONDANSETRON HCL 4 MG PO TABS: 4.0000 mg | ORAL_TABLET | Freq: Four times a day (QID) | ORAL | Status: DC | PRN

## 2018-10-19 MED ORDER — TRAZODONE HCL 50 MG PO TABS
50.0000 mg | ORAL_TABLET | Freq: Every day | ORAL | Status: DC
  Administered 2018-10-19: 01:00:00 50 mg via ORAL
  Filled 2018-10-19: qty 1

## 2018-10-19 MED ORDER — ACETAMINOPHEN 650 MG RE SUPP: 650.0000 mg | Freq: Four times a day (QID) | RECTAL | Status: DC | PRN

## 2018-10-19 MED ORDER — SODIUM CHLORIDE 0.9% FLUSH
3.0000 mL | Freq: Two times a day (BID) | INTRAVENOUS | Status: DC
  Administered 2018-10-20: 3 mL via INTRAVENOUS

## 2018-10-19 MED ORDER — DIVALPROEX SODIUM 125 MG PO CSDR: 125.0000 mg | DELAYED_RELEASE_CAPSULE | ORAL | Status: DC

## 2018-10-19 MED ORDER — PANTOPRAZOLE SODIUM 40 MG PO TBEC
40.0000 mg | DELAYED_RELEASE_TABLET | Freq: Every day | ORAL | Status: DC
  Filled 2018-10-19: qty 1

## 2018-10-19 MED ORDER — SODIUM CHLORIDE 0.9% FLUSH
3.0000 mL | Freq: Two times a day (BID) | INTRAVENOUS | Status: DC
  Administered 2018-10-19 (×2): 3 mL via INTRAVENOUS

## 2018-10-19 MED ORDER — MEMANTINE HCL 10 MG PO TABS
10.0000 mg | ORAL_TABLET | Freq: Two times a day (BID) | ORAL | Status: DC
  Administered 2018-10-19 – 2018-10-20 (×2): 10 mg via ORAL
  Filled 2018-10-19 (×3): qty 1

## 2018-10-19 NOTE — ED Notes (Signed)
Can take clear liquids per Dr. Myna Hidalgo.

## 2018-10-19 NOTE — Telephone Encounter (Signed)
Post ED Visit - Positive Culture Follow-up  Culture report reviewed by antimicrobial stewardship pharmacist: [place] Team []  Elenor Quinones, Pharm.D. []  Heide Guile, Pharm.D., BCPS AQ-ID []  Parks Neptune, Pharm.D., BCPS []  Alycia Rossetti, Pharm.D., BCPS []  [place], Pharm.D., BCPS, AAHIVP []  Legrand Como, Pharm.D., BCPS, AAHIVP []  Salome Arnt, PharmD, BCPS []  Johnnette Gourd, PharmD, BCPS []  Hughes Better, PharmD, BCPS []  Leeroy Cha, PharmD []  Laqueta Linden, PharmD, BCPS []  Albertina Parr, PharmD Stacy Gardner.D.  [place] Team []  Leodis Sias, PharmD []  Lindell Spar, PharmD []  Royetta Asal, PharmD []  Graylin Shiver, Rph []  Rema Fendt) Glennon Mac, PharmD []  Arlyn Dunning, PharmD []  Netta Cedars, PharmD []  Dia Sitter, PharmD []  Leone Haven, PharmD []  Gretta Arab, PharmD []  Theodis Shove, PharmD []  Peggyann Juba, PharmD []  Reuel Boom, PharmD   Positive urine culture >/= 100,000 colonies/ml Escherichia Coli Treated with Cephalexin, organism sensitive to the same and no further patient follow-up is required at this time.  Dortha Kern 10/19/2018, 2:24 PM

## 2018-10-19 NOTE — ED Notes (Signed)
Dinner called for pt 

## 2018-10-19 NOTE — ED Notes (Addendum)
Pt crying in bed. This RN asked pt if she was hurting.. Pt states she thinks so. Full bed changed. Pt very wet/oderous . Cleaned up. Pt agitated while being cleaned saying "ouch"

## 2018-10-19 NOTE — ED Notes (Signed)
Valerie Carr -[phone]- [place], called for pt update

## 2018-10-19 NOTE — ED Notes (Signed)
Attempted to check patient's blood sugar as patient has been refusing to eat. Pt combative and attempting to strike this RN, unable to get sugar at this time. No signs of hypoglycemia noted, pt is alert and awake. States "I don't feel good" but won't elaborate. All vitals WDL.

## 2018-10-19 NOTE — ED Notes (Signed)
Pt resting comfortably in bed, easily arousable, does not want to eat at this time

## 2018-10-19 NOTE — ED Notes (Signed)
Paged Dr. Myna Hidalgo for update on poistive COVID result

## 2018-10-19 NOTE — ED Notes (Signed)
Breakfast ordered 

## 2018-10-19 NOTE — Progress Notes (Addendum)
PROGRESS NOTE    Valerie Carr  LOV:[id number] DOB: September 15, 1929 DOA: 10/18/2018 PCP: Harlan Stains, MD    Brief Narrative:  85 year old female who presented for rectal bleeding.  She does have significant past medical history for advanced dementia with behavioral disturbances, sick sinus syndrome status post pacemaker, capital conus medullaris, atrial fibrillation and coronary artery disease.  Patient is nursing home resident, she was found to have rectal bleeding with rectal prolapse.  Patient was recently diagnosed with urine tract infection due to E. coli, treated with cephalexin.  On her initial physical examination blood pressure 151/83, pulse rate 94, respiratory rate 18, temperature 98.1, oxygen saturation 97%.  Her lungs are clear to auscultation bilaterally, heart S1-S2 present, regular regular, abdomen was soft and nontender, no lower extremity edema.  She was oriented only x1.  Sodium 144 potassium 4.3, chloride 102, bicarb 29, glucose 221, BUN 22, creatinine 0.94, white count 8.3, hemoglobin 13.9, hematocrit 41.9, platelets 259.  SARS COVID-19 was positive.  Urinalysis had more than 50 white cells, 6-10 red cells, positive nitrates, 100 protein.  Abdomen with interval increase size of infrarenal abdominal aortic aneurysm, 4.4 cm.  No bowel obstruction or active inflammation, sigmoid diverticulosis.  Her chest radiograph had no infiltrates.  EKG 75 bpm, left axis deviation, atrial fibrillation rhythm, no ST segment changes, inverted T waves lead II, 3, aVF, V1 through V3, intermittent ventricular pacing.  Patient was admitted to the hospital for working diagnosis of rectal bleeding, in the setting of newly diagnosed SARS COVID-19 infection.   Assessment & Plan:   Principal Problem:   Rectal bleeding Active Problems:   Coronary atherosclerosis of native coronary artery   Essential hypertension, benign   Chronic atrial fibrillation (HCC)   Acute lower UTI   Dementia with behavioral  disturbance (HCC)   Type 2 diabetes mellitus without complication, without long-term current use of insulin (HCC)   AAA (abdominal aortic aneurysm) ([place])   1. Rectal bleeding. Patient with no abdominal pain, Hgb is 13,4 from 13,8. No nausea or vomiting, no further melena or hematochezia. CT with diverticulosis but not diverticulitis. Will continue to follow closely H&H, no indication for PRBC transfusion at this point. Will continue antiacid therapy with pantoprazole. Will hold on invasive procedures for now, will follow a conservative approach for now.  2. Atrial fibrillation. Patient is sp Maze procedure, currently rate control, will continue to hold on aspirin for now.   3. T2DM. Her glucose has been low, poor oral intake, will hold on insulin therapy for now and will continue to monitor capillary glucose.   4. AAA. Worsening in size, now 4,4 cm. Will continue conservative management.   5. Recent urinary tract infection. Will continue antibiotic therapy with cephalexin for now.   6. SARS COVID 19 infection. Patient has tested positive for COVID 19, no signs of viral pneumonia, patient has been afebrile, no increase oxygen requirements, not candidate for specific COVID treatment for now. Patient tested positive 8 weeks ago at the SNF.   7. Advanced dementia. Severe confusion but not quite agitation this am, will continue divalproex, donepezil, duloxetine, memantine and trazodone.  On admission last night had lorazepam. Patient has been refusing oral medications and po intake.   DVT prophylaxis: scd   Code Status: dnr  Family Communication: I spoke with patient's daughter over the phone all questions were addressed.  Disposition Plan/ discharge barriers: pending clinical improvement.   Body mass index is 20.11 kg/m. Malnutrition Type:      Malnutrition  Characteristics:      Nutrition Interventions:     RN Pressure Injury Documentation:     Consultants:      Procedures:     Antimicrobials:       Subjective: Patient continue to be confused and disorientates, not agitated, denies any dyspnea, or abdominal pain, no nausea or vomiting, no further evidence of rectal bleeding while in the ED. Has been refusing po meds and oral intake.   Objective: Vitals:   10/18/18 1845 10/19/18 0003 10/19/18 0137 10/19/18 0225  BP: (!) 124/59 (!) 143/68 135/70 (!) 171/82  Pulse: 84 70 75 (!) 101  Resp:  20 20 13   Temp:  97.8 F (36.6 C) 98.1 F (36.7 C)   TempSrc:  Oral Oral   SpO2: 100% 99%  93%  Weight:   60 kg   Height:   5\' 8"  (1.727 m)     Intake/Output Summary (Last 24 hours) at 10/19/2018 1122 Last data filed at 10/19/2018 0137 Gross per 24 hour  Intake 500 ml  Output -  Net 500 ml   Filed Weights   10/19/18 0137  Weight: 60 kg    Examination:   General: Not in pain or dyspnea, deconditioned  Neurology: Awake and alert, non focal  E ENT: mild pallor, no icterus, oral mucosa moist Cardiovascular: No JVD. S1-S2 present, rhythmic, no gallops, rubs, or murmurs. No lower extremity edema. Pulmonary: Positive breath sounds bilaterally. Gastrointestinal. Abdomen with no organomegaly, non tender, no rebound or guarding Skin. No rashes Musculoskeletal: no joint deformities     Data Reviewed: I have personally reviewed following labs and imaging studies  CBC: Recent Labs  Lab 10/16/18 0137 10/18/18 1616 10/19/18 0740  WBC 7.5 8.3 7.0  NEUTROABS 4.5  --   --   HGB 12.0 13.8 13.4  HCT 38.3 41.9 40.6  MCV 88.9 88.6 88.1  PLT 225 259 211   Basic Metabolic Panel: Recent Labs  Lab 10/16/18 0137 10/18/18 1616 10/19/18 0740  NA 139 140 140  K 3.6 4.3 3.9  CL 101 102 103  CO2 29 29 28   GLUCOSE 127* 221* 163*  BUN 26* 22 17  CREATININE 0.73 0.94 0.92  CALCIUM 9.2 9.3 8.9  MG  --   --  2.0   GFR: Estimated Creatinine Clearance: 40.8 mL/min (by C-G formula based on SCr of 0.92 mg/dL). Liver Function Tests: Recent Labs   Lab 10/18/18 1616  AST 20  ALT 12  ALKPHOS 49  BILITOT 0.6  PROT 7.3  ALBUMIN 3.7   No results for input(s): LIPASE, AMYLASE in the last 168 hours. No results for input(s): AMMONIA in the last 168 hours. Coagulation Profile: Recent Labs  Lab 10/18/18 1654  INR 1.1   Cardiac Enzymes: No results for input(s): CKTOTAL, CKMB, CKMBINDEX, TROPONINI in the last 168 hours. BNP (last 3 results) No results for input(s): PROBNP in the last 8760 hours. HbA1C: No results for input(s): HGBA1C in the last 72 hours. CBG: Recent Labs  Lab 10/19/18 0603 10/19/18 0831  GLUCAP 166* 144*   Lipid Profile: No results for input(s): CHOL, HDL, LDLCALC, TRIG, CHOLHDL, LDLDIRECT in the last 72 hours. Thyroid Function Tests: No results for input(s): TSH, T4TOTAL, FREET4, T3FREE, THYROIDAB in the last 72 hours. Anemia Panel: Recent Labs    10/19/18 0740  FERRITIN 43      Radiology Studies: I have reviewed all of the imaging during this hospital visit personally     Scheduled Meds: . sodium chloride  Intravenous Once  . cephALEXin  500 mg Oral TID  . divalproex  125-250 mg Oral See admin instructions  . donepezil  10 mg Oral QHS  . DULoxetine  60 mg Oral q morning - 10a  . memantine  10 mg Oral BID WC  . pantoprazole  40 mg Oral Daily  . sodium chloride flush  3 mL Intravenous Q12H  . sodium chloride flush  3 mL Intravenous Q12H  . traZODone  50 mg Oral QHS  . traZODone  50 mg Oral QHS   Continuous Infusions: . sodium chloride       LOS: 0 days        Mauricio Annett Gula, MD

## 2018-10-19 NOTE — ED Notes (Signed)
Diet was ordered for Lunch. 

## 2018-10-19 NOTE — ED Notes (Signed)
Pt refusing anything PO at this time including food and medications. Attempted to assess whether patient was wet and she immediately attempted to move away and pull blankets back down no obvious wetness on outside of brief or bed. Pt resting comfortably at this time.

## 2018-10-20 DIAGNOSIS — I482 Chronic atrial fibrillation, unspecified: Secondary | ICD-10-CM | POA: Diagnosis not present

## 2018-10-20 DIAGNOSIS — Z7401 Bed confinement status: Secondary | ICD-10-CM | POA: Diagnosis not present

## 2018-10-20 DIAGNOSIS — R5381 Other malaise: Secondary | ICD-10-CM | POA: Diagnosis not present

## 2018-10-20 DIAGNOSIS — N3000 Acute cystitis without hematuria: Secondary | ICD-10-CM

## 2018-10-20 DIAGNOSIS — K625 Hemorrhage of anus and rectum: Secondary | ICD-10-CM | POA: Diagnosis not present

## 2018-10-20 DIAGNOSIS — U071 COVID-19: Secondary | ICD-10-CM | POA: Diagnosis not present

## 2018-10-20 DIAGNOSIS — I714 Abdominal aortic aneurysm, without rupture: Secondary | ICD-10-CM | POA: Diagnosis not present

## 2018-10-20 DIAGNOSIS — E119 Type 2 diabetes mellitus without complications: Secondary | ICD-10-CM | POA: Diagnosis not present

## 2018-10-20 DIAGNOSIS — F0391 Unspecified dementia with behavioral disturbance: Secondary | ICD-10-CM | POA: Diagnosis not present

## 2018-10-20 DIAGNOSIS — M2550 Pain in unspecified joint: Secondary | ICD-10-CM | POA: Diagnosis not present

## 2018-10-20 LAB — CBC WITH DIFFERENTIAL/PLATELET
Abs Immature Granulocytes: 0.04 10*3/uL (ref 0.00–0.07)
Basophils Absolute: 0.1 10*3/uL (ref 0.0–0.1)
Basophils Relative: 1 %
Eosinophils Absolute: 0.4 10*3/uL (ref 0.0–0.5)
Eosinophils Relative: 4 %
HCT: 39.3 % (ref 36.0–46.0)
Hemoglobin: 13.1 g/dL (ref 12.0–15.0)
Immature Granulocytes: 0 %
Lymphocytes Relative: 14 %
Lymphs Abs: 1.4 10*3/uL (ref 0.7–4.0)
MCH: 29.1 pg (ref 26.0–34.0)
MCHC: 33.3 g/dL (ref 30.0–36.0)
MCV: 87.3 fL (ref 80.0–100.0)
Monocytes Absolute: 1 10*3/uL (ref 0.1–1.0)
Monocytes Relative: 10 %
Neutro Abs: 7.1 10*3/uL (ref 1.7–7.7)
Neutrophils Relative %: 71 %
Platelets: 215 10*3/uL (ref 150–400)
RBC: 4.5 MIL/uL (ref 3.87–5.11)
RDW: 15 % (ref 11.5–15.5)
WBC: 9.9 10*3/uL (ref 4.0–10.5)
nRBC: 0 % (ref 0.0–0.2)

## 2018-10-20 LAB — BASIC METABOLIC PANEL
Anion gap: 13 (ref 5–15)
BUN: 17 mg/dL (ref 8–23)
CO2: 21 mmol/L — ABNORMAL LOW (ref 22–32)
Calcium: 8.8 mg/dL — ABNORMAL LOW (ref 8.9–10.3)
Chloride: 103 mmol/L (ref 98–111)
Creatinine, Ser: 0.96 mg/dL (ref 0.44–1.00)
GFR calc Af Amer: >60 mL/min (ref >60)
GFR calc non Af Amer: 53 mL/min — ABNORMAL LOW (ref >60)
Glucose, Bld: 246 mg/dL — ABNORMAL HIGH (ref 70–99)
Potassium: 4.4 mmol/L (ref 3.5–5.1)
Sodium: 137 mmol/L (ref 135–145)

## 2018-10-20 LAB — HEMOGLOBIN A1C
Hgb A1c MFr Bld: 7.6 % — ABNORMAL HIGH (ref 4.8–5.6)
Mean Plasma Glucose: 171 mg/dL

## 2018-10-20 LAB — GLUCOSE, CAPILLARY
Glucose-Capillary: 148 mg/dL — ABNORMAL HIGH (ref 70–99)
Glucose-Capillary: 164 mg/dL — ABNORMAL HIGH (ref 70–99)
Glucose-Capillary: 290 mg/dL — ABNORMAL HIGH (ref 70–99)

## 2018-10-20 MED ORDER — CEPHALEXIN 500 MG PO CAPS: 500.0000 mg | ORAL_CAPSULE | Freq: Three times a day (TID) | ORAL | Status: DC

## 2018-10-20 MED ORDER — PHENAZOPYRIDINE HCL 200 MG PO TABS: 200.0000 mg | ORAL_TABLET | Freq: Three times a day (TID) | ORAL | Status: DC | PRN

## 2018-10-20 NOTE — TOC Transition Note (Signed)
Transition of Care [facility]) - CM/SW Discharge Note   Patient Details  Name: Valerie Carr MRN: [id number] Date of Birth: 12-Nov-1929  [facility] of [facility]) CM/SW Contact:  Valerie Halsted, LCSW Phone Number: 10/20/2018, 3:20 PM   Clinical Narrative:    Patient will DC to: [facility] Anticipated DC date: 10/20/18 Family notified: Daughter, Sales executive by: Corey Harold   Per MD patient ready for DC to [facility] . RN, patient, patient's family, and facility [facility]) notified of DC. Discharge Summary and FL2 sent to facility. RN to call report prior to discharge [phone]). DC packet on chart. Ambulance transport requested for patient.   CSW will sign off for now as social work intervention is no longer needed. Please consult [place] again if new needs arise.  Cedric Fishman, LCSW Clinical Social Worker [phone]    Final next level of care: Skilled Nursing Facility Barriers to Discharge: No Barriers Identified   Patient Goals and CMS Choice Patient states their goals for this hospitalization and ongoing recovery are:: Return to SNF CMS Medicare.gov Compare Post Acute Care list provided to:: Patient Represenative (must comment)(Daughtery Valerie Carr) Choice offered to / list presented to : Adult Children  Discharge Placement   Existing PASRR number confirmed : 10/20/18          Patient chooses bed at: [facility] Patient to be transferred to facility by: [facility] Name of family member notified: Daughter, Valerie Carr Patient and family notified of of transfer: 10/20/18  Discharge Plan and Services In-house Referral: Clinical Social Work Discharge Planning Services: NA Post Acute Care Choice: [facility]          DME Arranged: N/A DME Agency: NA       HH Arranged: NA HH Agency: NA        Social Determinants of Health ([place]) Interventions     Readmission Risk Interventions No flowsheet data found.

## 2018-10-20 NOTE — Discharge Summary (Addendum)
Physician Discharge Summary  Valerie Carr ZOX:[id number] DOB: 04/09/1929  PCP: Laurann Montana, MD  Admitted from: [facility] SNF Discharged to: [facility]  Admit date: 10/18/2018 Discharge date: 10/20/2018  Recommendations for Outpatient Follow-up:   Follow-up Information    MD at SNF. Schedule an appointment as soon as possible for a visit.   Why: To be seen in 2 to 3 days with repeat labs (CBC & BMP).  May consider outpatient GI consultation if has recurrent rectal bleeding.  May also consider outpatient vascular surgery consultation for abdominal aortic aneurysm evaluation.       Laurann Montana, MD. Schedule an appointment as soon as possible for a visit.   Specialty: Family Medicine Contact information: [address], [address] [place] [address] [phone]         Resume aspirin at SNF when appropriate.   Home Health: None Equipment/Devices: None  Discharge Condition: Improved and stable CODE STATUS: DNR Diet recommendation: Heart healthy & diabetic diet.  Discharge Diagnoses:  Principal Problem:   Rectal bleeding Active Problems:   Coronary atherosclerosis of native coronary artery   Essential hypertension, benign   Chronic atrial fibrillation (HCC)   Acute lower UTI   Dementia with behavioral disturbance (HCC)   Type 2 diabetes mellitus without complication, without long-term current use of insulin (HCC)   AAA (abdominal aortic aneurysm) [facility])   Brief Summary: 85 year old female, resident of [facility] for about a year, PMH of advanced dementia with behavioral disturbances, sick sinus syndrome s/p PPM, type II DM/IDDM, CAD, A. fib not on anticoagulation but on aspirin, was sent to the ED from her SNF for evaluation of possible rectal bleeding and rectal prolapse.  Patient was unable to provide much history due to her advanced dementia.  She had been seen in the ED on 9/19 due to complaint of blood in stools, was uncooperative to exam  by EDP, EMS had reported rectal prolapse but daughter was concerned that she had a UTI, patient had been incontinent.  As per EDP then, no blood around the rectum, no rectal prolapse, did have UTI on urine microscopy, urine culture was sent and patient was started on oral Keflex and discharged back to facility.  However it appears that patient never started the Keflex as per SNF report.  Daughter reported COVID positive but asymptomatic months ago, completed quarantine.  She again returned to ED on 9/21 due to recurrent rectal prolapse/rectal bleeding with pain.  Again evaluation by EDP did not reveal rectal prolapse but the urine was orange/red possibly from Pyridium which could discolor the urine red.  CT abdomen was obtained which showed slightly enlarging abdominal aortic aneurysm but without acute findings.  She was admitted for possible rectal bleed, questionable rectal prolapse, FOBT +, UTI for observation and evaluation as needed.  Assessment and plan:  1. Possible rectal bleeding: It is not entirely clear if patient truly had rectal bleeding from suspected recurrent rectal prolapse or this was related to urine discoloration from Pyridium recently started by ED.  However H&P indicates that red blood was noted on DRE.  CT abdomen without acute findings.  Aspirin was held in the hospital.  As per discussion with RN, no recurrence of rectal bleeding.  Hemoglobin has been normal and stable all along.  As per my extensive discussion with daughter, given lack of definitive mass whether this was truly rectal bleeding, even if it was it appears to be minimal given no recurrence and normal hemoglobin  which has remained stable, hemodynamic stability, recommend observation and conservative management versus aggressive evaluation which would include GI consultation, even if done would not be a candidate for invasive evaluations i.e. colonoscopy due to advanced age, frail health, behavioral disturbances, COVID +.   Daughter was in agreement for continued observation and conservative management.  Aspirin currently on hold and may consider resuming at SNF in a few days if no recurrence of rectal bleeding.  Defer to SNF MD regarding outpatient GI consultation if has recurrent rectal bleeding.  Currently resolved.  Other differential diagnosis include diverticulosis versus other etiologies. 2. Atrial fibrillation: CHADS-VASc at least 5 (age x2, gender, CAD, DM).  Controlled ventricular rate.  Not on anticoagulation PTA.  Was on aspirin 325 mg daily which is currently on hold due to recent rectal bleeding and may be resumed at SNF in a couple days if has no recurrence.  Reportedly s/p Maze procedure. 3. E. coli acute cystitis: Urine culture from 9/19 shows pansensitive E. coli.  It appears that patient never started antibiotic as outpatient.  Started on Keflex here.  Complete total 3 days course of Keflex. 4. CAD: No anginal symptoms.  Aspirin discussion as noted above. 5. Advanced dementia with behavioral disturbances: Continue prior home medications including Aricept, Namenda and Depakote. 6. Type II DM/IDDM: Resume prior home dose of insulins and metformin will be resumed tonight which will be 48 hours after CT contrast.  A1c of 7.6 suggests reasonable outpatient control 7. Abdominal aortic aneurysm: Interval increase in size to 4.4 cm with calcification versus mural hemorrhages noted on CT abdomen/pelvis in ED.  May consider outpatient vascular surgery evaluation depending on how aggressive family wishes to be including CTA abdomen and pelvis in 6 months. 8. SARS coronavirus 19 testing positive: Asymptomatic including of respiratory symptoms and not hypoxic.  9. Intermittent rectal prolapse: EDP exam on this admission had not shown a rectal prolapse.  After I had discharged the patient, the Hospitalist caring for her at the SNF Dr. Anson Fret reached out to me and indicated that on his exam he had found a large  rectal prolapse. I reexamined the patient along with her female RN as chaperone in the room.  Patient was not very cooperative but did allow external exam and there was absolutely no rectal prolapse or bleeding at this time.  I informed Dr. Eden Emms and he was going to consider outpatient referral to colorectal surgery in case this becomes a recurrent issue and is not reducible easily.  Consultations:  None  Procedures:  None   Discharge Instructions  Discharge Instructions    Call MD for:   Complete by: As directed    Recurrent rectal bleeding.   Call MD for:  difficulty breathing, headache or visual disturbances   Complete by: As directed    Call MD for:  extreme fatigue   Complete by: As directed    Call MD for:  persistant dizziness or light-headedness   Complete by: As directed    Call MD for:  persistant nausea and vomiting   Complete by: As directed    Call MD for:  severe uncontrolled pain   Complete by: As directed    Call MD for:  temperature >100.4   Complete by: As directed    Diet - low sodium heart healthy   Complete by: As directed    Diet Carb Modified   Complete by: As directed    Increase activity slowly   Complete by: As directed  Medication List    STOP taking these medications   aspirin 325 MG tablet     TAKE these medications   bacitracin-polymyxin b ophthalmic ointment Commonly known as: POLYSPORIN Place 1 application into the right eye every 12 (twelve) hours. apply to eye every 12 hours while awake   cephALEXin 500 MG capsule Commonly known as: KEFLEX Take 1 capsule (500 mg total) by mouth 3 (three) times daily. Discontinue after 10/21/2018 doses. What changed: additional instructions   divalproex 125 MG capsule Commonly known as: DEPAKOTE SPRINKLE Take 125-250 mg by mouth See admin instructions. Take one capsule (125 mg) by mouth every morning and two capsules (250 mg) at bedtime - for behaviors   donepezil 10 MG tablet Commonly  known as: ARICEPT TAKE 1 TABLET (10 MG TOTAL) BY MOUTH AT BEDTIME. What changed:   how much to take  how to take this  when to take this  additional instructions   DULoxetine 60 MG capsule Commonly known as: CYMBALTA Take 60 mg by mouth every morning. For depression   insulin detemir 100 UNIT/ML injection Commonly known as: LEVEMIR Inject 15 Units into the skin at bedtime.   insulin lispro 100 UNIT/ML injection Commonly known as: HUMALOG Inject 2 Units into the skin 3 (three) times daily before meals.   memantine 10 MG tablet Commonly known as: Namenda Take 1 tablet (10 mg total) by mouth 2 (two) times daily. What changed: when to take this   metFORMIN 500 MG tablet Commonly known as: GLUCOPHAGE Take 500 mg by mouth 2 (two) times daily with a meal.   omeprazole 20 MG capsule Commonly known as: PRILOSEC Take 20 mg by mouth daily at 6 (six) AM.   ONE TOUCH ULTRA TEST test strip Generic drug: glucose blood TEST DAILY DX E11.51   phenazopyridine 200 MG tablet Commonly known as: PYRIDIUM Take 1 tablet (200 mg total) by mouth 3 (three) times daily as needed for pain (For 2 to 3 days then to be discontinued.). What changed:   when to take this  reasons to take this   Senokot S 8.6-50 MG tablet Generic drug: senna-docusate Take 1 tablet by mouth 2 (two) times daily with a meal.   traZODone 50 MG tablet Commonly known as: DESYREL Take 50 mg by mouth at bedtime.   Vitamin D3 50 MCG (2000 UT) Tabs Take 2,000 Units by mouth every morning.      Allergies  Allergen Reactions  . Clarithromycin Other (See Comments)    unknown  . Codeine Phosphate Other (See Comments)    unknown  . Effexor [Venlafaxine Hydrochloride] Other (See Comments)    unknown  . Fexofenadine Hcl Other (See Comments)    unknown  . Lisinopril Other (See Comments)    unknown  . Prozac [Fluoxetine Hcl] Other (See Comments)    unknown  . Septra [Bactrim] Other (See Comments)    unknown   . Wellbutrin [Bupropion Hcl] Other (See Comments)    unknown      Procedures/Studies: Ct Abdomen Pelvis W Contrast  Result Date: 10/18/2018 CLINICAL DATA:  85 year old female with abdominal pain and rectal bleeding. EXAM: CT ABDOMEN AND PELVIS WITH CONTRAST TECHNIQUE: Multidetector CT imaging of the abdomen and pelvis was performed using the standard protocol following bolus administration of intravenous contrast. CONTRAST:  100mL OMNIPAQUE IOHEXOL 300 MG/ML  SOLN COMPARISON:  CT of the abdomen pelvis dated 04/18/2016 FINDINGS: Lower chest: Partially visualized subsegmental atelectasis in the right middle lobe and lingula. Multi vessel coronary vascular  calcification and postsurgical changes of CABG and pacemaker wires noted. No intra-abdominal free air or free fluid. Hepatobiliary: The liver is unremarkable. There is mild intrahepatic biliary ductal dilatation. There is a 17 mm stone in the gallbladder. No pericholecystic fluid or evidence of acute cholecystitis by CT. Pancreas: Unremarkable. No pancreatic ductal dilatation or surrounding inflammatory changes. Spleen: Normal in size without focal abnormality. Adrenals/Urinary Tract: The right adrenal gland is unremarkable. There is mild left adrenal nodularity. There is no hydronephrosis on either side. There is symmetric enhancement and excretion of contrast by both kidneys. The visualized ureters and urinary bladder appear unremarkable. Stomach/Bowel: There is sigmoid diverticulosis without active inflammatory changes. Postsurgical changes of partial small bowel resection with anastomotic suture in the pelvis. There is no evidence of bowel obstruction or active inflammation. The appendix is not visualized with certainty. No inflammatory changes identified in the right lower quadrant. Vascular/Lymphatic: Advanced aortoiliac atherosclerotic disease. There are multiple abdominal aortic aneurysms. There is a 4.5 cm aneurysmal dilatation of the aorta at the  diaphragmatic hiatus (previously 4.0 cm). And small dilatation of the suprarenal aorta at the level of the SMA measuring up to 4.1 cm in greatest diameter (series 3, image 25) similar to prior CT. There is a fusiform, partially thrombosed, infrarenal abdominal aortic aneurysm measuring approximately 4.4 cm in greatest diameter (series 7 image 88) which has increased in size since the prior CT when it measured approximately 4 cm by my measurement. Evaluation of the aneurysm is limited as precontrast images are not provided. Linear high attenuating bands within thrombosed lumen of the aneurysm appear new since the prior CT. These may represent areas of calcification although new mural hemorrhages are not excluded. There is no extravasation of contrast outside of the confines of the aorta. No periaortic inflammatory changes or fluid collection. The origins of the celiac axis, SMA are patent. There is a retroaortic left renal vein anatomy. The IVC is unremarkable. No portal venous gas. There is no adenopathy. Reproductive: Hysterectomy. No pelvic mass. Other: Midline vertical anterior abdominal wall incisional scar. Musculoskeletal: Osteopenia with degenerative changes of the spine. No acute osseous pathology. IMPRESSION: 1. Interval increase in the size of the infrarenal abdominal aortic aneurysm measuring up to 4.4 cm in greatest diameter since the prior CT. Linear high attenuating bands within the thrombosed lumen of the aneurysm appear new since the prior CT and may represent areas of calcification although new mural hemorrhages are not excluded. No extravasation of contrast outside of the confines of the aorta. Recommend followup by abdomen and pelvis CTA in 6 months, and vascular surgery referral/consultation if not already obtained. This recommendation follows ACR consensus guidelines: White Paper of the ACR Incidental Findings Committee II on Vascular Findings. J Am Coll Radiol 2013; 10:789-794. Aortic aneurysm  NOS (ICD10-I71.9) 2. Cholelithiasis. 3. Sigmoid diverticulosis. No bowel obstruction or active inflammation. Aortic Atherosclerosis (ICD10-I70.0). Electronically Signed   By: Anner Crete M.D.   On: 10/18/2018 22:27   Dg Chest Port 1 View  Result Date: 10/19/2018 CLINICAL DATA:  COVID-19 positive patient. EXAM: PORTABLE CHEST 1 VIEW COMPARISON:  Single-view of the chest 09/03/2016. FINDINGS: The lungs are clear. Heart size is normal. The patient is status post CABG with a pacing device in place. No pneumothorax or pleural fluid. No acute or focal bony abnormality. IMPRESSION: No acute disease. Electronically Signed   By: Inge Rise M.D.   On: 10/19/2018 07:52      Subjective: Patient is pleasantly confused.  Alert and oriented to  self and partly to place.  Denies complaints.  Specifically denies abdominal pain, nausea or vomiting.  Aware that she is in the hospital.  As per nursing, no rectal bleeding for at least greater than 24 hours and no acute issues noted.  Had some behavioral issues in the ED yesterday afternoon.  Discharge Exam:  Vitals:   10/19/18 2346 10/20/18 0300 10/20/18 0800 10/20/18 1131  BP: 134/76 112/69 124/73 137/80  Pulse: 82 82 (!) 51 81  Resp:  19 15   Temp: 98.4 F (36.9 C) 98.5 F (36.9 C) 97.7 F (36.5 C) (!) 97.3 F (36.3 C)  TempSrc: Oral Oral  Oral  SpO2: 97% 98% 96% 100%  Weight:      Height:        General: Pleasant elderly female, moderately built and thinly nourished lying comfortably propped up in bed without distress. Cardiovascular: S1 & S2 heard, RRR, S1/S2 +. No murmurs, rubs, gallops or clicks. No JVD or pedal edema. Respiratory: Clear to auscultation without wheezing, rhonchi or crackles. No increased work of breathing.  Telemetry personally reviewed: A. fib with controlled ventricular rate. Abdominal:  Non distended, non tender & soft. No organomegaly or masses appreciated. Normal bowel sounds heard. External rectal exam: No rectal  prolapse or acute findings. CNS: Alert and oriented. No focal deficits. Extremities: no edema, no cyanosis    The results of significant diagnostics from this hospitalization (including imaging, microbiology, ancillary and laboratory) are listed below for reference.     Microbiology: Recent Results (from the past 240 hour(s))  Urine culture     Status: Abnormal   Collection Time: 10/16/18  3:47 AM   Specimen: Urine, Clean Catch  Result Value Ref Range Status   Specimen Description   Final    URINE, CLEAN CATCH Performed at [facility], [address], [place], [place] [address]    Special Requests   Final    Normal Performed at [facility], [address], [place], [place] [address]    Culture >=100,000 COLONIES/mL ESCHERICHIA COLI (A)  Final   Report Status 10/18/2018 FINAL  Final   Organism ID, Bacteria ESCHERICHIA COLI (A)  Final      Susceptibility   Escherichia coli - MIC*    AMPICILLIN <=2 SENSITIVE Sensitive     CEFAZOLIN <=4 SENSITIVE Sensitive     CEFTRIAXONE <=1 SENSITIVE Sensitive     CIPROFLOXACIN <=0.25 SENSITIVE Sensitive     GENTAMICIN <=1 SENSITIVE Sensitive     IMIPENEM <=0.25 SENSITIVE Sensitive     NITROFURANTOIN <=16 SENSITIVE Sensitive     TRIMETH/SULFA <=20 SENSITIVE Sensitive     AMPICILLIN/SULBACTAM <=2 SENSITIVE Sensitive     PIP/TAZO <=4 SENSITIVE Sensitive     Extended ESBL NEGATIVE Sensitive     * >=100,000 COLONIES/mL ESCHERICHIA COLI  SARS CORONAVIRUS 2 (TAT 6-24 HRS) Nasopharyngeal Nasopharyngeal Swab     Status: Abnormal   Collection Time: 10/18/18  6:33 PM   Specimen: Nasopharyngeal Swab  Result Value Ref Range Status   SARS Coronavirus 2 POSITIVE (A) NEGATIVE Final    Comment: (NOTE) SARS-CoV-2 target nucleic acids are DETECTED. The SARS-CoV-2 RNA is generally detectable in upper and lower respiratory specimens during the acute phase of infection. Positive results are indicative of active infection with SARS-CoV-2. Clinical   correlation with patient history and other diagnostic information is necessary to determine patient infection status. Positive results do  not rule out bacterial infection or co-infection with other viruses. The expected result is Negative. Fact Sheet  for Patients: HairSlick.no Fact Sheet for Healthcare Providers: quierodirigir.com This test is not yet approved or cleared by the [place] FDA and  has been authorized for detection and/or diagnosis of SARS-CoV-2 by FDA under an Emergency Use Authorization (EUA). This EUA will remain  in effect (meaning this test can be used) for the duration of the COVID-19 declaration under Section 564(b)(1) of the Act, 21 U.S.C.  section 360bbb-3(b)(1), unless the authorization is terminated or revoked sooner. Performed at [facility], [address], [place], [place] [address]   Blood culture (routine x 2)     Status: None (Preliminary result)   Collection Time: 10/18/18 11:45 PM   Specimen: Site Not Specified; Blood  Result Value Ref Range Status   Specimen Description SITE NOT SPECIFIED  Final   Special Requests   Final    BOTTLES DRAWN AEROBIC AND ANAEROBIC Blood Culture adequate volume   Culture   Final    NO GROWTH 1 DAY Performed at [facility], [address], [place], [place] [address]    Report Status PENDING  Incomplete  Blood culture (routine x 2)     Status: None (Preliminary result)   Collection Time: 10/19/18  1:05 AM   Specimen: BLOOD LEFT FOREARM  Result Value Ref Range Status   Specimen Description BLOOD LEFT FOREARM  Final   Special Requests   Final    BOTTLES DRAWN AEROBIC AND ANAEROBIC Blood Culture adequate volume   Culture   Final    NO GROWTH 1 DAY Performed at [facility], [address] [place], [place] [address]    Report Status PENDING  Incomplete     Labs: CBC: Recent Labs  Lab 10/16/18 0137 10/18/18 1616 10/19/18 0740  10/20/18 1057  WBC 7.5 8.3 7.0 9.9  NEUTROABS 4.5  --   --  7.1  HGB 12.0 13.8 13.4 13.1  HCT 38.3 41.9 40.6 39.3  MCV 88.9 88.6 88.1 87.3  PLT 225 259 211 215   Basic Metabolic Panel: Recent Labs  Lab 10/16/18 0137 10/18/18 1616 10/19/18 0740 10/20/18 1057  NA 139 140 140 137  K 3.6 4.3 3.9 4.4  CL 101 102 103 103  CO2 29 29 28  21*  GLUCOSE 127* 221* 163* 246*  BUN 26* 22 17 17   CREATININE 0.73 0.94 0.92 0.96  CALCIUM 9.2 9.3 8.9 8.8*  MG  --   --  2.0  --    Liver Function Tests: Recent Labs  Lab 10/18/18 1616  AST 20  ALT 12  ALKPHOS 49  BILITOT 0.6  PROT 7.3  ALBUMIN 3.7   BNP (last 3 results) No results for input(s): BNP in the last 8760 hours. Cardiac Enzymes: No results for input(s): CKTOTAL, CKMB, CKMBINDEX, TROPONINI in the last 168 hours. CBG: Recent Labs  Lab 10/19/18 1935 10/19/18 2338 10/20/18 0436 10/20/18 0809 10/20/18 1142  GLUCAP 214* 142* 148* 164* 290*   Hgb A1c Recent Labs    10/19/18 0740  HGBA1C 7.6*   Lipid Profile No results for input(s): CHOL, HDL, LDLCALC, TRIG, CHOLHDL, LDLDIRECT in the last 72 hours. Thyroid function studies No results for input(s): TSH, T4TOTAL, T3FREE, THYROIDAB in the last 72 hours.  Invalid input(s): FREET3 Anemia work up Recent Labs    10/19/18 0740  FERRITIN 43   Urinalysis    Component Value Date/Time   COLORURINE AMBER (A) 10/18/2018 1730   APPEARANCEUR CLOUDY (A) 10/18/2018 1730   LABSPEC 1.021 10/18/2018 1730  PHURINE 5.0 10/18/2018 1730   GLUCOSEU NEGATIVE 10/18/2018 1730   HGBUR MODERATE (A) 10/18/2018 1730   BILIRUBINUR NEGATIVE 10/18/2018 1730   KETONESUR NEGATIVE 10/18/2018 1730   PROTEINUR 100 (A) 10/18/2018 1730   UROBILINOGEN 1.0 05/26/2013 1648   NITRITE POSITIVE (A) 10/18/2018 1730   LEUKOCYTESUR SMALL (A) 10/18/2018 1730    I discussed in detail with patient's daughter, updated care and answered all questions.  Time coordinating discharge: 35  minutes  SIGNED:  Marcellus Scott, MD, FACP, [facility]. [facility]  To contact the attending provider between 7A-7P or the covering provider during after hours 7P-7A, please log into the web site [email or web address] and access using universal [place] password for that web site. If you do not have the password, please call the hospital operator.

## 2018-10-20 NOTE — Progress Notes (Signed)
[place] to give report for Ms Rita to return. Was transferred twice then put on hold for over 15 minutes. [facility] at beside to transport patient.

## 2018-10-20 NOTE — Evaluation (Signed)
Physical Therapy Evaluation Patient Details Name: Valerie Carr MRN: [id number] DOB: 1929-12-03 Today's Date: 10/20/2018   History of Present Illness  Pt adm from [facility] with rectal bleeding. Pt found + for Covid. PMH - severe dementia, pacer, HTN, DM, cad, afib  Clinical Impression  Pt admitted with above diagnosis and presents to PT with functional limitations due to deficits listed below (See PT problem list). Pt needs skilled PT to maximize independence and safety to allow discharge to back to prior level of care at [facility]. Will follow acutely but don't feel she will need PT at dc.      Follow Up Recommendations No PT follow up(return to prior level of care at [facility])    Equipment Recommendations  None recommended by PT    Recommendations for Other Services       Precautions / Restrictions Precautions Precautions: Fall      Mobility  Bed Mobility Overal bed mobility: Needs Assistance Bed Mobility: Supine to Sit;Sit to Supine     Supine to sit: Min guard Sit to supine: Min guard   General bed mobility comments: assist for safety  Transfers Overall transfer level: Needs assistance Equipment used: 1 person hand held assist Transfers: Sit to/from Stand Sit to Stand: Min assist;Min guard         General transfer comment: Assist for balance on first transfer and then min guard  Ambulation/Gait Ambulation/Gait assistance: Min assist;Min guard Gait Distance (Feet): 15 Feet Assistive device: 1 person hand held assist Gait Pattern/deviations: Step-through pattern;Decreased stride length Gait velocity: decr Gait velocity interpretation: <1.31 ft/sec, indicative of household ambulator General Gait Details: Assist for balance. Improved stability with incr activity. Limited distance due to pt becoming agitated   Stairs            Wheelchair Mobility    Modified Rankin (Stroke Patients Only)       Balance Overall balance assessment:  Needs assistance Sitting-balance support: No upper extremity supported;Feet supported Sitting balance-Leahy Scale: Fair     Standing balance support: No upper extremity supported Standing balance-Leahy Scale: Fair                               Pertinent Vitals/Pain      Home Living Family/patient expects to be discharged to:: Skilled nursing facility                 Additional Comments: (long term/memory care)    Prior Function Level of Independence: Needs assistance   Gait / Transfers Assistance Needed: Appears she ambulates modified independent without assistive device           Hand Dominance        Extremity/Trunk Assessment   Upper Extremity Assessment Upper Extremity Assessment: Overall WFL for tasks assessed    Lower Extremity Assessment Lower Extremity Assessment: Generalized weakness       Communication   Communication: No difficulties  Cognition Arousal/Alertness: Awake/alert Behavior During Therapy: WFL for tasks assessed/performed Overall Cognitive Status: No family/caregiver present to determine baseline cognitive functioning                                 General Comments: History of dementia. Pt becomes easily agitated      General Comments General comments (skin integrity, edema, etc.): VSS. Pt on RA    Exercises     Assessment/Plan  PT Assessment Patient needs continued PT services  PT Problem List Decreased balance;Decreased mobility       PT Treatment Interventions Gait training;Functional mobility training;Therapeutic activities;Therapeutic exercise;Balance training;Patient/family education    PT Goals (Current goals can be found in the Care Plan section)  Acute Rehab PT Goals Patient Stated Goal: not stated PT Goal Formulation: Patient unable to participate in goal setting Time For Goal Achievement: 10/27/18 Potential to Achieve Goals: Good    Frequency Min 2X/week   Barriers to  discharge        Co-evaluation               AM-PAC PT "6 Clicks" Mobility  Outcome Measure Help needed turning from your back to your side while in a flat bed without using bedrails?: None Help needed moving from lying on your back to sitting on the side of a flat bed without using bedrails?: None Help needed moving to and from a bed to a chair (including a wheelchair)?: A Little Help needed standing up from a chair using your arms (e.g., wheelchair or bedside chair)?: A Little Help needed to walk in hospital room?: A Little Help needed climbing 3-5 steps with a railing? : A Little 6 Click Score: 20    End of Session   Activity Tolerance: Treatment limited secondary to agitation Patient left: in bed;with call bell/phone within reach;with bed alarm set Nurse Communication: Mobility status PT Visit Diagnosis: Unsteadiness on feet (R26.81)    Time: 0017-4944 PT Time Calculation (min) (ACUTE ONLY): 19 min   Charges:   PT Evaluation $PT Eval Moderate Complexity: 1 Mod          [facility] PT Acute Rehabilitation Services Pager [phone] Office [phone]   Angelina Ok [facility] 10/20/2018, 2:03 PM

## 2018-10-20 NOTE — TOC Initial Note (Signed)
Transition of Care [facility]) - Initial/Assessment Note    Patient Details  Name: Valerie Carr MRN: [id number] Date of Birth: 1930-01-20  Transition of Care [facility]) CM/SW Contact:    Mearl Latin, LCSW Phone Number: 10/20/2018, 12:34 PM  Clinical Narrative:                 CSW received consult for possible SNF placement at time of discharge. CSW spoke with patient's daughter Valerie Carr to confirm plan. Valerie Carr reported agreement with patient returning to [facility] She requests [facility]AR for transport.  No further questions reported at this time.   CSW spoke with Carollee Herter at [facility]. She reported that since patient is in Observation status, no FL2 is required. She is aware patient is positive for COVID and patient's room is ready. MD aware.   CSW to continue to follow and assist with discharge planning needs.   Expected Discharge Plan: Skilled Nursing Facility Barriers to Discharge: No Barriers Identified   Patient Goals and CMS Choice Patient states their goals for this hospitalization and ongoing recovery are:: Return to SNF CMS Medicare.gov Compare Post Acute Care list provided to:: Patient Represenative (must comment)(Daughtery Valerie Carr) Choice offered to / list presented to : Adult Children  Expected Discharge Plan and Services Expected Discharge Plan: Skilled Nursing Facility In-house Referral: Clinical Social Work Discharge Planning Services: NA Post Acute Care Choice: Skilled Nursing Facility Living arrangements for the past 2 months: Skilled Nursing Facility                 DME Arranged: N/A DME Agency: NA       HH Arranged: NA HH Agency: NA        Prior Living Arrangements/Services Living arrangements for the past 2 months: Skilled Nursing Facility Lives with:: Facility Resident Patient language and need for interpreter reviewed:: Yes Do you feel safe going back to the place where you live?: Yes      Need for Family Participation in Patient Care: Yes  (Comment) Care giver support system in place?: Yes (comment)   Criminal Activity/Legal Involvement Pertinent to Current Situation/Hospitalization: No - Comment as needed  Activities of Daily Living   ADL Screening (condition at time of admission) Patient's cognitive ability adequate to safely complete daily activities?: No Is the patient deaf or have difficulty hearing?: No Does the patient have difficulty seeing, even when wearing glasses/contacts?: No Does the patient have difficulty concentrating, remembering, or making decisions?: Yes Patient able to express need for assistance with ADLs?: No Does the patient have difficulty dressing or bathing?: No Independently performs ADLs?: No Communication: Appropriate for developmental age Grooming: Appropriate for developmental age Feeding: Independent, Needs assistance Is this a change from baseline?: Pre-admission baseline  Permission Sought/Granted Permission sought to share information with : Facility Medical sales representative, Family Supports Permission granted to share information with : No  Share Information with NAME: Valerie Carr  Permission granted to share info w AGENCY: [employer] granted to share info w Relationship: Daughter  Permission granted to share info w Contact Information: [phone]  Emotional Assessment Appearance:: Appears stated age Attitude/Demeanor/Rapport: Unable to Assess Affect (typically observed): Unable to Assess Orientation: : Oriented to Self Alcohol / Substance Use: Not Applicable Psych Involvement: No (comment)  Admission diagnosis:  Hyperglycemia [R73.9] Heme positive stool [R19.5] Acute cystitis without hematuria [N30.00] Abdominal aortic aneurysm (AAA) 3.0 cm to 5.0 cm in diameter in female [facility]) [I71.4] COVID-19 virus infection [U07.1] Rectal bleeding [K62.5] Patient Active Problem List   Diagnosis  Date Noted  . Heme positive stool   . Hyperglycemia   . COVID-19 virus infection    . Rectal bleeding 10/18/2018  . AAA (abdominal aortic aneurysm) ([place]) 10/18/2018  . Dementia with behavioral disturbance ([place])   . Type 2 diabetes mellitus without complication, without long-term current use of insulin ([place])   . Acute lower UTI 09/04/2016  . Acute cystitis without hematuria   . AKI (acute kidney injury) ([place]) 09/03/2016  . Chronic atrial fibrillation ([place]) 05/31/2014  . Cardiac pacemaker in situ 05/31/2014  . Coronary artery disease involving coronary bypass graft of native heart without angina pectoris 05/31/2014  . Carotid artery stenosis, asymptomatic 05/31/2014  . Sick sinus syndrome ([place]) 04/18/2013  . Coronary atherosclerosis of native coronary artery 01/11/2013  . Essential hypertension, benign 01/11/2013  . Mixed hyperlipidemia 01/11/2013  . Alzheimer's disease ([place]) 09/02/2012  . Bradycardia 01/06/2011   PCP:  Harlan Stains, MD Pharmacy:  No Pharmacies Listed    Social Determinants of Health (SDOH) Interventions    Readmission Risk Interventions No flowsheet data found.

## 2018-10-20 NOTE — Discharge Instructions (Signed)

## 2018-10-24 LAB — CULTURE, BLOOD (ROUTINE X 2)
Culture: NO GROWTH
Culture: NO GROWTH
Special Requests: ADEQUATE
Special Requests: ADEQUATE

## 2018-11-01 DIAGNOSIS — Z23 Encounter for immunization: Secondary | ICD-10-CM | POA: Diagnosis not present

## 2018-11-05 DIAGNOSIS — Z79899 Other long term (current) drug therapy: Secondary | ICD-10-CM | POA: Diagnosis not present

## 2018-11-05 DIAGNOSIS — E119 Type 2 diabetes mellitus without complications: Secondary | ICD-10-CM | POA: Diagnosis not present

## 2018-11-15 DIAGNOSIS — I10 Essential (primary) hypertension: Secondary | ICD-10-CM | POA: Diagnosis not present

## 2018-11-15 DIAGNOSIS — E119 Type 2 diabetes mellitus without complications: Secondary | ICD-10-CM | POA: Diagnosis not present

## 2018-11-15 DIAGNOSIS — K922 Gastrointestinal hemorrhage, unspecified: Secondary | ICD-10-CM | POA: Diagnosis not present

## 2018-11-15 DIAGNOSIS — U071 COVID-19: Secondary | ICD-10-CM | POA: Diagnosis not present

## 2018-11-15 DIAGNOSIS — S60219A Contusion of unspecified wrist, initial encounter: Secondary | ICD-10-CM | POA: Diagnosis not present

## 2018-12-20 DIAGNOSIS — Z20828 Contact with and (suspected) exposure to other viral communicable diseases: Secondary | ICD-10-CM | POA: Diagnosis not present

## 2018-12-28 DIAGNOSIS — Z03818 Encounter for observation for suspected exposure to other biological agents ruled out: Secondary | ICD-10-CM | POA: Diagnosis not present

## 2018-12-28 DIAGNOSIS — Z20828 Contact with and (suspected) exposure to other viral communicable diseases: Secondary | ICD-10-CM | POA: Diagnosis not present

## 2018-12-29 DIAGNOSIS — Z8673 Personal history of transient ischemic attack (TIA), and cerebral infarction without residual deficits: Secondary | ICD-10-CM | POA: Diagnosis not present

## 2018-12-29 DIAGNOSIS — E119 Type 2 diabetes mellitus without complications: Secondary | ICD-10-CM | POA: Diagnosis not present

## 2018-12-29 DIAGNOSIS — I10 Essential (primary) hypertension: Secondary | ICD-10-CM | POA: Diagnosis not present

## 2018-12-29 DIAGNOSIS — U071 COVID-19: Secondary | ICD-10-CM | POA: Diagnosis not present

## 2019-01-03 DIAGNOSIS — Z20828 Contact with and (suspected) exposure to other viral communicable diseases: Secondary | ICD-10-CM | POA: Diagnosis not present

## 2019-01-10 DIAGNOSIS — Z20828 Contact with and (suspected) exposure to other viral communicable diseases: Secondary | ICD-10-CM | POA: Diagnosis not present

## 2019-01-31 DIAGNOSIS — Z03818 Encounter for observation for suspected exposure to other biological agents ruled out: Secondary | ICD-10-CM | POA: Diagnosis not present

## 2019-01-31 DIAGNOSIS — Z20828 Contact with and (suspected) exposure to other viral communicable diseases: Secondary | ICD-10-CM | POA: Diagnosis not present

## 2019-02-02 DIAGNOSIS — R4586 Emotional lability: Secondary | ICD-10-CM | POA: Diagnosis not present

## 2019-02-02 DIAGNOSIS — I10 Essential (primary) hypertension: Secondary | ICD-10-CM | POA: Diagnosis not present

## 2019-02-02 DIAGNOSIS — E119 Type 2 diabetes mellitus without complications: Secondary | ICD-10-CM | POA: Diagnosis not present

## 2019-02-02 DIAGNOSIS — I639 Cerebral infarction, unspecified: Secondary | ICD-10-CM | POA: Diagnosis not present

## 2019-02-09 DIAGNOSIS — Z23 Encounter for immunization: Secondary | ICD-10-CM | POA: Diagnosis not present

## 2019-02-14 DIAGNOSIS — Z20822 Contact with and (suspected) exposure to covid-19: Secondary | ICD-10-CM | POA: Diagnosis not present

## 2019-02-14 DIAGNOSIS — Z1152 Encounter for screening for COVID-19: Secondary | ICD-10-CM | POA: Diagnosis not present

## 2019-02-21 DIAGNOSIS — Z1152 Encounter for screening for COVID-19: Secondary | ICD-10-CM | POA: Diagnosis not present

## 2019-02-21 DIAGNOSIS — Z20822 Contact with and (suspected) exposure to covid-19: Secondary | ICD-10-CM | POA: Diagnosis not present

## 2019-02-25 DIAGNOSIS — F0151 Vascular dementia with behavioral disturbance: Secondary | ICD-10-CM | POA: Diagnosis not present

## 2019-02-25 DIAGNOSIS — F324 Major depressive disorder, single episode, in partial remission: Secondary | ICD-10-CM | POA: Diagnosis not present

## 2019-02-28 DIAGNOSIS — R41841 Cognitive communication deficit: Secondary | ICD-10-CM | POA: Diagnosis not present

## 2019-02-28 DIAGNOSIS — Z20822 Contact with and (suspected) exposure to covid-19: Secondary | ICD-10-CM | POA: Diagnosis not present

## 2019-02-28 DIAGNOSIS — Z1152 Encounter for screening for COVID-19: Secondary | ICD-10-CM | POA: Diagnosis not present

## 2019-02-28 DIAGNOSIS — R278 Other lack of coordination: Secondary | ICD-10-CM | POA: Diagnosis not present

## 2019-03-01 DIAGNOSIS — R41841 Cognitive communication deficit: Secondary | ICD-10-CM | POA: Diagnosis not present

## 2019-03-01 DIAGNOSIS — R278 Other lack of coordination: Secondary | ICD-10-CM | POA: Diagnosis not present

## 2019-03-02 DIAGNOSIS — I4891 Unspecified atrial fibrillation: Secondary | ICD-10-CM | POA: Diagnosis not present

## 2019-03-02 DIAGNOSIS — R278 Other lack of coordination: Secondary | ICD-10-CM | POA: Diagnosis not present

## 2019-03-02 DIAGNOSIS — E119 Type 2 diabetes mellitus without complications: Secondary | ICD-10-CM | POA: Diagnosis not present

## 2019-03-02 DIAGNOSIS — U071 COVID-19: Secondary | ICD-10-CM | POA: Diagnosis not present

## 2019-03-02 DIAGNOSIS — F0150 Vascular dementia without behavioral disturbance: Secondary | ICD-10-CM | POA: Diagnosis not present

## 2019-03-02 DIAGNOSIS — R41841 Cognitive communication deficit: Secondary | ICD-10-CM | POA: Diagnosis not present

## 2019-03-02 DIAGNOSIS — I10 Essential (primary) hypertension: Secondary | ICD-10-CM | POA: Diagnosis not present

## 2019-03-03 DIAGNOSIS — R41841 Cognitive communication deficit: Secondary | ICD-10-CM | POA: Diagnosis not present

## 2019-03-03 DIAGNOSIS — R278 Other lack of coordination: Secondary | ICD-10-CM | POA: Diagnosis not present

## 2019-03-04 DIAGNOSIS — R278 Other lack of coordination: Secondary | ICD-10-CM | POA: Diagnosis not present

## 2019-03-04 DIAGNOSIS — R41841 Cognitive communication deficit: Secondary | ICD-10-CM | POA: Diagnosis not present

## 2019-03-06 DIAGNOSIS — R41841 Cognitive communication deficit: Secondary | ICD-10-CM | POA: Diagnosis not present

## 2019-03-06 DIAGNOSIS — R278 Other lack of coordination: Secondary | ICD-10-CM | POA: Diagnosis not present

## 2019-03-07 DIAGNOSIS — Z20828 Contact with and (suspected) exposure to other viral communicable diseases: Secondary | ICD-10-CM | POA: Diagnosis not present

## 2019-03-07 DIAGNOSIS — Z03818 Encounter for observation for suspected exposure to other biological agents ruled out: Secondary | ICD-10-CM | POA: Diagnosis not present

## 2019-03-07 DIAGNOSIS — R41841 Cognitive communication deficit: Secondary | ICD-10-CM | POA: Diagnosis not present

## 2019-03-07 DIAGNOSIS — R278 Other lack of coordination: Secondary | ICD-10-CM | POA: Diagnosis not present

## 2019-03-09 DIAGNOSIS — Z23 Encounter for immunization: Secondary | ICD-10-CM | POA: Diagnosis not present

## 2019-03-09 DIAGNOSIS — R41841 Cognitive communication deficit: Secondary | ICD-10-CM | POA: Diagnosis not present

## 2019-03-09 DIAGNOSIS — R278 Other lack of coordination: Secondary | ICD-10-CM | POA: Diagnosis not present

## 2019-03-10 DIAGNOSIS — R41841 Cognitive communication deficit: Secondary | ICD-10-CM | POA: Diagnosis not present

## 2019-03-10 DIAGNOSIS — R278 Other lack of coordination: Secondary | ICD-10-CM | POA: Diagnosis not present

## 2019-03-11 DIAGNOSIS — F0151 Vascular dementia with behavioral disturbance: Secondary | ICD-10-CM | POA: Diagnosis not present

## 2019-03-11 DIAGNOSIS — R278 Other lack of coordination: Secondary | ICD-10-CM | POA: Diagnosis not present

## 2019-03-11 DIAGNOSIS — R41841 Cognitive communication deficit: Secondary | ICD-10-CM | POA: Diagnosis not present

## 2019-03-12 DIAGNOSIS — R278 Other lack of coordination: Secondary | ICD-10-CM | POA: Diagnosis not present

## 2019-03-12 DIAGNOSIS — R41841 Cognitive communication deficit: Secondary | ICD-10-CM | POA: Diagnosis not present

## 2019-03-13 DIAGNOSIS — R41841 Cognitive communication deficit: Secondary | ICD-10-CM | POA: Diagnosis not present

## 2019-03-13 DIAGNOSIS — R278 Other lack of coordination: Secondary | ICD-10-CM | POA: Diagnosis not present

## 2019-03-15 DIAGNOSIS — R41841 Cognitive communication deficit: Secondary | ICD-10-CM | POA: Diagnosis not present

## 2019-03-15 DIAGNOSIS — R278 Other lack of coordination: Secondary | ICD-10-CM | POA: Diagnosis not present

## 2019-03-19 DIAGNOSIS — R278 Other lack of coordination: Secondary | ICD-10-CM | POA: Diagnosis not present

## 2019-03-19 DIAGNOSIS — R41841 Cognitive communication deficit: Secondary | ICD-10-CM | POA: Diagnosis not present

## 2019-03-20 DIAGNOSIS — R278 Other lack of coordination: Secondary | ICD-10-CM | POA: Diagnosis not present

## 2019-03-20 DIAGNOSIS — R41841 Cognitive communication deficit: Secondary | ICD-10-CM | POA: Diagnosis not present

## 2019-03-21 DIAGNOSIS — Z1152 Encounter for screening for COVID-19: Secondary | ICD-10-CM | POA: Diagnosis not present

## 2019-03-21 DIAGNOSIS — Z20822 Contact with and (suspected) exposure to covid-19: Secondary | ICD-10-CM | POA: Diagnosis not present

## 2019-03-22 DIAGNOSIS — R278 Other lack of coordination: Secondary | ICD-10-CM | POA: Diagnosis not present

## 2019-03-22 DIAGNOSIS — I4891 Unspecified atrial fibrillation: Secondary | ICD-10-CM | POA: Diagnosis not present

## 2019-03-22 DIAGNOSIS — R41841 Cognitive communication deficit: Secondary | ICD-10-CM | POA: Diagnosis not present

## 2019-03-22 DIAGNOSIS — Z8673 Personal history of transient ischemic attack (TIA), and cerebral infarction without residual deficits: Secondary | ICD-10-CM | POA: Diagnosis not present

## 2019-03-22 DIAGNOSIS — F0151 Vascular dementia with behavioral disturbance: Secondary | ICD-10-CM | POA: Diagnosis not present

## 2019-03-22 DIAGNOSIS — I10 Essential (primary) hypertension: Secondary | ICD-10-CM | POA: Diagnosis not present

## 2019-03-24 DIAGNOSIS — R41841 Cognitive communication deficit: Secondary | ICD-10-CM | POA: Diagnosis not present

## 2019-03-24 DIAGNOSIS — R278 Other lack of coordination: Secondary | ICD-10-CM | POA: Diagnosis not present

## 2019-03-25 DIAGNOSIS — R41841 Cognitive communication deficit: Secondary | ICD-10-CM | POA: Diagnosis not present

## 2019-03-25 DIAGNOSIS — R278 Other lack of coordination: Secondary | ICD-10-CM | POA: Diagnosis not present

## 2019-03-25 DIAGNOSIS — F324 Major depressive disorder, single episode, in partial remission: Secondary | ICD-10-CM | POA: Diagnosis not present

## 2019-03-25 DIAGNOSIS — F0151 Vascular dementia with behavioral disturbance: Secondary | ICD-10-CM | POA: Diagnosis not present

## 2019-03-26 DIAGNOSIS — R41841 Cognitive communication deficit: Secondary | ICD-10-CM | POA: Diagnosis not present

## 2019-03-26 DIAGNOSIS — R278 Other lack of coordination: Secondary | ICD-10-CM | POA: Diagnosis not present

## 2019-03-28 DIAGNOSIS — R41841 Cognitive communication deficit: Secondary | ICD-10-CM | POA: Diagnosis not present

## 2019-03-28 DIAGNOSIS — R278 Other lack of coordination: Secondary | ICD-10-CM | POA: Diagnosis not present

## 2019-03-30 DIAGNOSIS — R41841 Cognitive communication deficit: Secondary | ICD-10-CM | POA: Diagnosis not present

## 2019-03-30 DIAGNOSIS — R278 Other lack of coordination: Secondary | ICD-10-CM | POA: Diagnosis not present

## 2019-03-31 DIAGNOSIS — R41841 Cognitive communication deficit: Secondary | ICD-10-CM | POA: Diagnosis not present

## 2019-03-31 DIAGNOSIS — R278 Other lack of coordination: Secondary | ICD-10-CM | POA: Diagnosis not present

## 2019-04-01 DIAGNOSIS — R278 Other lack of coordination: Secondary | ICD-10-CM | POA: Diagnosis not present

## 2019-04-01 DIAGNOSIS — R41841 Cognitive communication deficit: Secondary | ICD-10-CM | POA: Diagnosis not present

## 2019-04-04 DIAGNOSIS — R278 Other lack of coordination: Secondary | ICD-10-CM | POA: Diagnosis not present

## 2019-04-04 DIAGNOSIS — R41841 Cognitive communication deficit: Secondary | ICD-10-CM | POA: Diagnosis not present

## 2019-04-07 DIAGNOSIS — I639 Cerebral infarction, unspecified: Secondary | ICD-10-CM | POA: Diagnosis not present

## 2019-04-07 DIAGNOSIS — I4891 Unspecified atrial fibrillation: Secondary | ICD-10-CM | POA: Diagnosis not present

## 2019-04-07 DIAGNOSIS — I10 Essential (primary) hypertension: Secondary | ICD-10-CM | POA: Diagnosis not present

## 2019-04-07 DIAGNOSIS — F0151 Vascular dementia with behavioral disturbance: Secondary | ICD-10-CM | POA: Diagnosis not present

## 2019-04-27 DIAGNOSIS — F324 Major depressive disorder, single episode, in partial remission: Secondary | ICD-10-CM | POA: Diagnosis not present

## 2019-04-27 DIAGNOSIS — I4891 Unspecified atrial fibrillation: Secondary | ICD-10-CM | POA: Diagnosis not present

## 2019-04-27 DIAGNOSIS — F0151 Vascular dementia with behavioral disturbance: Secondary | ICD-10-CM | POA: Diagnosis not present

## 2019-04-27 DIAGNOSIS — E119 Type 2 diabetes mellitus without complications: Secondary | ICD-10-CM | POA: Diagnosis not present

## 2019-04-27 DIAGNOSIS — I10 Essential (primary) hypertension: Secondary | ICD-10-CM | POA: Diagnosis not present

## 2019-04-27 DIAGNOSIS — F5101 Primary insomnia: Secondary | ICD-10-CM | POA: Diagnosis not present

## 2019-04-27 DIAGNOSIS — I639 Cerebral infarction, unspecified: Secondary | ICD-10-CM | POA: Diagnosis not present

## 2019-04-27 DIAGNOSIS — K922 Gastrointestinal hemorrhage, unspecified: Secondary | ICD-10-CM | POA: Diagnosis not present

## 2019-05-11 DIAGNOSIS — F5101 Primary insomnia: Secondary | ICD-10-CM | POA: Diagnosis not present

## 2019-05-11 DIAGNOSIS — F0151 Vascular dementia with behavioral disturbance: Secondary | ICD-10-CM | POA: Diagnosis not present

## 2019-05-11 DIAGNOSIS — F324 Major depressive disorder, single episode, in partial remission: Secondary | ICD-10-CM | POA: Diagnosis not present

## 2019-05-11 DIAGNOSIS — F0630 Mood disorder due to known physiological condition, unspecified: Secondary | ICD-10-CM | POA: Diagnosis not present

## 2019-05-23 DIAGNOSIS — R41841 Cognitive communication deficit: Secondary | ICD-10-CM | POA: Diagnosis not present

## 2019-05-23 DIAGNOSIS — R269 Unspecified abnormalities of gait and mobility: Secondary | ICD-10-CM | POA: Diagnosis not present

## 2019-05-23 DIAGNOSIS — M6281 Muscle weakness (generalized): Secondary | ICD-10-CM | POA: Diagnosis not present

## 2019-05-24 DIAGNOSIS — M6281 Muscle weakness (generalized): Secondary | ICD-10-CM | POA: Diagnosis not present

## 2019-05-24 DIAGNOSIS — R41841 Cognitive communication deficit: Secondary | ICD-10-CM | POA: Diagnosis not present

## 2019-05-24 DIAGNOSIS — I2510 Atherosclerotic heart disease of native coronary artery without angina pectoris: Secondary | ICD-10-CM | POA: Diagnosis not present

## 2019-05-24 DIAGNOSIS — I639 Cerebral infarction, unspecified: Secondary | ICD-10-CM | POA: Diagnosis not present

## 2019-05-24 DIAGNOSIS — F0151 Vascular dementia with behavioral disturbance: Secondary | ICD-10-CM | POA: Diagnosis not present

## 2019-05-24 DIAGNOSIS — I10 Essential (primary) hypertension: Secondary | ICD-10-CM | POA: Diagnosis not present

## 2019-05-24 DIAGNOSIS — R269 Unspecified abnormalities of gait and mobility: Secondary | ICD-10-CM | POA: Diagnosis not present

## 2019-05-24 DIAGNOSIS — E119 Type 2 diabetes mellitus without complications: Secondary | ICD-10-CM | POA: Diagnosis not present

## 2019-05-25 DIAGNOSIS — R41841 Cognitive communication deficit: Secondary | ICD-10-CM | POA: Diagnosis not present

## 2019-05-25 DIAGNOSIS — M6281 Muscle weakness (generalized): Secondary | ICD-10-CM | POA: Diagnosis not present

## 2019-05-25 DIAGNOSIS — F5101 Primary insomnia: Secondary | ICD-10-CM | POA: Diagnosis not present

## 2019-05-25 DIAGNOSIS — F0151 Vascular dementia with behavioral disturbance: Secondary | ICD-10-CM | POA: Diagnosis not present

## 2019-05-25 DIAGNOSIS — R269 Unspecified abnormalities of gait and mobility: Secondary | ICD-10-CM | POA: Diagnosis not present

## 2019-05-25 DIAGNOSIS — F0630 Mood disorder due to known physiological condition, unspecified: Secondary | ICD-10-CM | POA: Diagnosis not present

## 2019-05-25 DIAGNOSIS — F324 Major depressive disorder, single episode, in partial remission: Secondary | ICD-10-CM | POA: Diagnosis not present

## 2019-05-26 DIAGNOSIS — R41841 Cognitive communication deficit: Secondary | ICD-10-CM | POA: Diagnosis not present

## 2019-05-26 DIAGNOSIS — R269 Unspecified abnormalities of gait and mobility: Secondary | ICD-10-CM | POA: Diagnosis not present

## 2019-05-26 DIAGNOSIS — M6281 Muscle weakness (generalized): Secondary | ICD-10-CM | POA: Diagnosis not present

## 2019-05-27 DIAGNOSIS — R269 Unspecified abnormalities of gait and mobility: Secondary | ICD-10-CM | POA: Diagnosis not present

## 2019-05-27 DIAGNOSIS — M6281 Muscle weakness (generalized): Secondary | ICD-10-CM | POA: Diagnosis not present

## 2019-05-27 DIAGNOSIS — R41841 Cognitive communication deficit: Secondary | ICD-10-CM | POA: Diagnosis not present

## 2019-05-28 DIAGNOSIS — R269 Unspecified abnormalities of gait and mobility: Secondary | ICD-10-CM | POA: Diagnosis not present

## 2019-05-28 DIAGNOSIS — M6281 Muscle weakness (generalized): Secondary | ICD-10-CM | POA: Diagnosis not present

## 2019-05-31 DIAGNOSIS — M6281 Muscle weakness (generalized): Secondary | ICD-10-CM | POA: Diagnosis not present

## 2019-05-31 DIAGNOSIS — R269 Unspecified abnormalities of gait and mobility: Secondary | ICD-10-CM | POA: Diagnosis not present

## 2019-06-02 DIAGNOSIS — R269 Unspecified abnormalities of gait and mobility: Secondary | ICD-10-CM | POA: Diagnosis not present

## 2019-06-02 DIAGNOSIS — M6281 Muscle weakness (generalized): Secondary | ICD-10-CM | POA: Diagnosis not present

## 2019-06-03 DIAGNOSIS — R269 Unspecified abnormalities of gait and mobility: Secondary | ICD-10-CM | POA: Diagnosis not present

## 2019-06-03 DIAGNOSIS — M6281 Muscle weakness (generalized): Secondary | ICD-10-CM | POA: Diagnosis not present

## 2019-06-04 DIAGNOSIS — R269 Unspecified abnormalities of gait and mobility: Secondary | ICD-10-CM | POA: Diagnosis not present

## 2019-06-04 DIAGNOSIS — M6281 Muscle weakness (generalized): Secondary | ICD-10-CM | POA: Diagnosis not present

## 2019-06-06 DIAGNOSIS — M6281 Muscle weakness (generalized): Secondary | ICD-10-CM | POA: Diagnosis not present

## 2019-06-06 DIAGNOSIS — R269 Unspecified abnormalities of gait and mobility: Secondary | ICD-10-CM | POA: Diagnosis not present

## 2019-06-07 DIAGNOSIS — M6281 Muscle weakness (generalized): Secondary | ICD-10-CM | POA: Diagnosis not present

## 2019-06-07 DIAGNOSIS — R269 Unspecified abnormalities of gait and mobility: Secondary | ICD-10-CM | POA: Diagnosis not present

## 2019-06-08 DIAGNOSIS — M6281 Muscle weakness (generalized): Secondary | ICD-10-CM | POA: Diagnosis not present

## 2019-06-08 DIAGNOSIS — R269 Unspecified abnormalities of gait and mobility: Secondary | ICD-10-CM | POA: Diagnosis not present

## 2019-06-09 DIAGNOSIS — R269 Unspecified abnormalities of gait and mobility: Secondary | ICD-10-CM | POA: Diagnosis not present

## 2019-06-09 DIAGNOSIS — M6281 Muscle weakness (generalized): Secondary | ICD-10-CM | POA: Diagnosis not present

## 2019-06-13 DIAGNOSIS — R269 Unspecified abnormalities of gait and mobility: Secondary | ICD-10-CM | POA: Diagnosis not present

## 2019-06-13 DIAGNOSIS — M6281 Muscle weakness (generalized): Secondary | ICD-10-CM | POA: Diagnosis not present

## 2019-06-14 DIAGNOSIS — M6281 Muscle weakness (generalized): Secondary | ICD-10-CM | POA: Diagnosis not present

## 2019-06-14 DIAGNOSIS — R269 Unspecified abnormalities of gait and mobility: Secondary | ICD-10-CM | POA: Diagnosis not present

## 2019-06-15 DIAGNOSIS — R269 Unspecified abnormalities of gait and mobility: Secondary | ICD-10-CM | POA: Diagnosis not present

## 2019-06-15 DIAGNOSIS — M6281 Muscle weakness (generalized): Secondary | ICD-10-CM | POA: Diagnosis not present

## 2019-06-17 DIAGNOSIS — R269 Unspecified abnormalities of gait and mobility: Secondary | ICD-10-CM | POA: Diagnosis not present

## 2019-06-17 DIAGNOSIS — M6281 Muscle weakness (generalized): Secondary | ICD-10-CM | POA: Diagnosis not present

## 2019-06-18 DIAGNOSIS — R269 Unspecified abnormalities of gait and mobility: Secondary | ICD-10-CM | POA: Diagnosis not present

## 2019-06-18 DIAGNOSIS — M6281 Muscle weakness (generalized): Secondary | ICD-10-CM | POA: Diagnosis not present

## 2019-06-19 DIAGNOSIS — R269 Unspecified abnormalities of gait and mobility: Secondary | ICD-10-CM | POA: Diagnosis not present

## 2019-06-19 DIAGNOSIS — M6281 Muscle weakness (generalized): Secondary | ICD-10-CM | POA: Diagnosis not present

## 2019-06-21 DIAGNOSIS — R269 Unspecified abnormalities of gait and mobility: Secondary | ICD-10-CM | POA: Diagnosis not present

## 2019-06-21 DIAGNOSIS — M6281 Muscle weakness (generalized): Secondary | ICD-10-CM | POA: Diagnosis not present

## 2019-06-22 DIAGNOSIS — E119 Type 2 diabetes mellitus without complications: Secondary | ICD-10-CM | POA: Diagnosis not present

## 2019-06-22 DIAGNOSIS — I10 Essential (primary) hypertension: Secondary | ICD-10-CM | POA: Diagnosis not present

## 2019-06-22 DIAGNOSIS — R269 Unspecified abnormalities of gait and mobility: Secondary | ICD-10-CM | POA: Diagnosis not present

## 2019-06-22 DIAGNOSIS — F0151 Vascular dementia with behavioral disturbance: Secondary | ICD-10-CM | POA: Diagnosis not present

## 2019-06-22 DIAGNOSIS — I639 Cerebral infarction, unspecified: Secondary | ICD-10-CM | POA: Diagnosis not present

## 2019-06-22 DIAGNOSIS — M6281 Muscle weakness (generalized): Secondary | ICD-10-CM | POA: Diagnosis not present

## 2019-06-23 DIAGNOSIS — F5105 Insomnia due to other mental disorder: Secondary | ICD-10-CM | POA: Diagnosis not present

## 2019-06-23 DIAGNOSIS — R269 Unspecified abnormalities of gait and mobility: Secondary | ICD-10-CM | POA: Diagnosis not present

## 2019-06-23 DIAGNOSIS — M6281 Muscle weakness (generalized): Secondary | ICD-10-CM | POA: Diagnosis not present

## 2019-06-23 DIAGNOSIS — F0630 Mood disorder due to known physiological condition, unspecified: Secondary | ICD-10-CM | POA: Diagnosis not present

## 2019-06-23 DIAGNOSIS — F0151 Vascular dementia with behavioral disturbance: Secondary | ICD-10-CM | POA: Diagnosis not present

## 2019-07-19 ENCOUNTER — Emergency Department (HOSPITAL_COMMUNITY): Payer: Medicare Other

## 2019-07-19 ENCOUNTER — Other Ambulatory Visit: Payer: Self-pay

## 2019-07-19 ENCOUNTER — Encounter (HOSPITAL_COMMUNITY): Payer: Self-pay

## 2019-07-19 ENCOUNTER — Inpatient Hospital Stay (HOSPITAL_COMMUNITY)
Admission: EM | Admit: 2019-07-19 | Discharge: 2019-07-24 | DRG: 481 | Disposition: A | Discharge status: Skilled Nursing Facility | Payer: Medicare Other | Source: Skilled Nursing Facility | Attending: Internal Medicine | Admitting: Internal Medicine

## 2019-07-19 DIAGNOSIS — Z419 Encounter for procedure for purposes other than remedying health state, unspecified: Secondary | ICD-10-CM

## 2019-07-19 DIAGNOSIS — W19XXXA Unspecified fall, initial encounter: Secondary | ICD-10-CM | POA: Diagnosis present

## 2019-07-19 DIAGNOSIS — Z950 Presence of cardiac pacemaker: Secondary | ICD-10-CM | POA: Diagnosis not present

## 2019-07-19 DIAGNOSIS — I2510 Atherosclerotic heart disease of native coronary artery without angina pectoris: Secondary | ICD-10-CM | POA: Diagnosis not present

## 2019-07-19 DIAGNOSIS — I10 Essential (primary) hypertension: Secondary | ICD-10-CM | POA: Diagnosis present

## 2019-07-19 DIAGNOSIS — Z888 Allergy status to other drugs, medicaments and biological substances status: Secondary | ICD-10-CM

## 2019-07-19 DIAGNOSIS — Z66 Do not resuscitate: Secondary | ICD-10-CM | POA: Diagnosis present

## 2019-07-19 DIAGNOSIS — Z87891 Personal history of nicotine dependence: Secondary | ICD-10-CM

## 2019-07-19 DIAGNOSIS — Z79899 Other long term (current) drug therapy: Secondary | ICD-10-CM | POA: Diagnosis not present

## 2019-07-19 DIAGNOSIS — Z794 Long term (current) use of insulin: Secondary | ICD-10-CM | POA: Diagnosis not present

## 2019-07-19 DIAGNOSIS — S72141D Displaced intertrochanteric fracture of right femur, subsequent encounter for closed fracture with routine healing: Secondary | ICD-10-CM | POA: Diagnosis not present

## 2019-07-19 DIAGNOSIS — S72001A Fracture of unspecified part of neck of right femur, initial encounter for closed fracture: Secondary | ICD-10-CM | POA: Diagnosis present

## 2019-07-19 DIAGNOSIS — F0151 Vascular dementia with behavioral disturbance: Secondary | ICD-10-CM | POA: Diagnosis not present

## 2019-07-19 DIAGNOSIS — Z7401 Bed confinement status: Secondary | ICD-10-CM | POA: Diagnosis not present

## 2019-07-19 DIAGNOSIS — I482 Chronic atrial fibrillation, unspecified: Secondary | ICD-10-CM | POA: Diagnosis present

## 2019-07-19 DIAGNOSIS — G309 Alzheimer's disease, unspecified: Secondary | ICD-10-CM | POA: Diagnosis not present

## 2019-07-19 DIAGNOSIS — E440 Moderate protein-calorie malnutrition: Secondary | ICD-10-CM | POA: Diagnosis not present

## 2019-07-19 DIAGNOSIS — I495 Sick sinus syndrome: Secondary | ICD-10-CM | POA: Diagnosis present

## 2019-07-19 DIAGNOSIS — Z833 Family history of diabetes mellitus: Secondary | ICD-10-CM

## 2019-07-19 DIAGNOSIS — B372 Candidiasis of skin and nail: Secondary | ICD-10-CM | POA: Diagnosis not present

## 2019-07-19 DIAGNOSIS — F419 Anxiety disorder, unspecified: Secondary | ICD-10-CM | POA: Diagnosis not present

## 2019-07-19 DIAGNOSIS — K8020 Calculus of gallbladder without cholecystitis without obstruction: Secondary | ICD-10-CM | POA: Diagnosis present

## 2019-07-19 DIAGNOSIS — S72009A Fracture of unspecified part of neck of unspecified femur, initial encounter for closed fracture: Secondary | ICD-10-CM

## 2019-07-19 DIAGNOSIS — S72141A Displaced intertrochanteric fracture of right femur, initial encounter for closed fracture: Principal | ICD-10-CM | POA: Diagnosis present

## 2019-07-19 DIAGNOSIS — E876 Hypokalemia: Secondary | ICD-10-CM | POA: Diagnosis present

## 2019-07-19 DIAGNOSIS — E119 Type 2 diabetes mellitus without complications: Secondary | ICD-10-CM | POA: Diagnosis present

## 2019-07-19 DIAGNOSIS — Y92129 Unspecified place in nursing home as the place of occurrence of the external cause: Secondary | ICD-10-CM

## 2019-07-19 DIAGNOSIS — E1165 Type 2 diabetes mellitus with hyperglycemia: Secondary | ICD-10-CM | POA: Diagnosis present

## 2019-07-19 DIAGNOSIS — Z87440 Personal history of urinary (tract) infections: Secondary | ICD-10-CM

## 2019-07-19 DIAGNOSIS — Z8616 Personal history of COVID-19: Secondary | ICD-10-CM

## 2019-07-19 DIAGNOSIS — G4701 Insomnia due to medical condition: Secondary | ICD-10-CM | POA: Diagnosis not present

## 2019-07-19 DIAGNOSIS — F329 Major depressive disorder, single episode, unspecified: Secondary | ICD-10-CM | POA: Diagnosis present

## 2019-07-19 DIAGNOSIS — Z20822 Contact with and (suspected) exposure to covid-19: Secondary | ICD-10-CM | POA: Diagnosis present

## 2019-07-19 DIAGNOSIS — Z7982 Long term (current) use of aspirin: Secondary | ICD-10-CM | POA: Diagnosis not present

## 2019-07-19 DIAGNOSIS — E782 Mixed hyperlipidemia: Secondary | ICD-10-CM | POA: Diagnosis not present

## 2019-07-19 DIAGNOSIS — I739 Peripheral vascular disease, unspecified: Secondary | ICD-10-CM | POA: Diagnosis not present

## 2019-07-19 DIAGNOSIS — I499 Cardiac arrhythmia, unspecified: Secondary | ICD-10-CM | POA: Diagnosis not present

## 2019-07-19 DIAGNOSIS — R739 Hyperglycemia, unspecified: Secondary | ICD-10-CM | POA: Diagnosis not present

## 2019-07-19 DIAGNOSIS — Z881 Allergy status to other antibiotic agents status: Secondary | ICD-10-CM

## 2019-07-19 DIAGNOSIS — F324 Major depressive disorder, single episode, in partial remission: Secondary | ICD-10-CM | POA: Diagnosis not present

## 2019-07-19 DIAGNOSIS — Z8249 Family history of ischemic heart disease and other diseases of the circulatory system: Secondary | ICD-10-CM

## 2019-07-19 DIAGNOSIS — R52 Pain, unspecified: Secondary | ICD-10-CM | POA: Diagnosis not present

## 2019-07-19 DIAGNOSIS — F0280 Dementia in other diseases classified elsewhere without behavioral disturbance: Secondary | ICD-10-CM | POA: Diagnosis present

## 2019-07-19 DIAGNOSIS — F349 Persistent mood [affective] disorder, unspecified: Secondary | ICD-10-CM | POA: Diagnosis not present

## 2019-07-19 DIAGNOSIS — Z885 Allergy status to narcotic agent status: Secondary | ICD-10-CM

## 2019-07-19 DIAGNOSIS — F0281 Dementia in other diseases classified elsewhere with behavioral disturbance: Secondary | ICD-10-CM | POA: Diagnosis not present

## 2019-07-19 DIAGNOSIS — Z951 Presence of aortocoronary bypass graft: Secondary | ICD-10-CM

## 2019-07-19 DIAGNOSIS — G300 Alzheimer's disease with early onset: Secondary | ICD-10-CM | POA: Diagnosis not present

## 2019-07-19 DIAGNOSIS — B3789 Other sites of candidiasis: Secondary | ICD-10-CM | POA: Diagnosis not present

## 2019-07-19 DIAGNOSIS — R404 Transient alteration of awareness: Secondary | ICD-10-CM | POA: Diagnosis not present

## 2019-07-19 DIAGNOSIS — M2550 Pain in unspecified joint: Secondary | ICD-10-CM | POA: Diagnosis not present

## 2019-07-19 DIAGNOSIS — M25561 Pain in right knee: Secondary | ICD-10-CM

## 2019-07-19 DIAGNOSIS — E559 Vitamin D deficiency, unspecified: Secondary | ICD-10-CM | POA: Diagnosis not present

## 2019-07-19 DIAGNOSIS — K219 Gastro-esophageal reflux disease without esophagitis: Secondary | ICD-10-CM | POA: Diagnosis present

## 2019-07-19 DIAGNOSIS — M8588 Other specified disorders of bone density and structure, other site: Secondary | ICD-10-CM | POA: Diagnosis not present

## 2019-07-19 DIAGNOSIS — E1169 Type 2 diabetes mellitus with other specified complication: Secondary | ICD-10-CM | POA: Diagnosis not present

## 2019-07-19 DIAGNOSIS — Z90710 Acquired absence of both cervix and uterus: Secondary | ICD-10-CM

## 2019-07-19 DIAGNOSIS — G308 Other Alzheimer's disease: Secondary | ICD-10-CM | POA: Diagnosis not present

## 2019-07-19 LAB — BASIC METABOLIC PANEL
Anion gap: 12 (ref 5–15)
BUN: 30 mg/dL — ABNORMAL HIGH (ref 8–23)
CO2: 24 mmol/L (ref 22–32)
Calcium: 8.7 mg/dL — ABNORMAL LOW (ref 8.9–10.3)
Chloride: 100 mmol/L (ref 98–111)
Creatinine, Ser: 0.75 mg/dL (ref 0.44–1.00)
GFR calc Af Amer: >60 mL/min (ref >60)
GFR calc non Af Amer: >60 mL/min (ref >60)
Glucose, Bld: 328 mg/dL — ABNORMAL HIGH (ref 70–99)
Potassium: 3.4 mmol/L — ABNORMAL LOW (ref 3.5–5.1)
Sodium: 136 mmol/L (ref 135–145)

## 2019-07-19 LAB — CBC WITH DIFFERENTIAL/PLATELET
Abs Immature Granulocytes: 0.04 10*3/uL (ref 0.00–0.07)
Basophils Absolute: 0.1 10*3/uL (ref 0.0–0.1)
Basophils Relative: 1 %
Eosinophils Absolute: 0.1 10*3/uL (ref 0.0–0.5)
Eosinophils Relative: 1 %
HCT: 36.3 % (ref 36.0–46.0)
Hemoglobin: 12.2 g/dL (ref 12.0–15.0)
Immature Granulocytes: 0 %
Lymphocytes Relative: 11 %
Lymphs Abs: 1.2 10*3/uL (ref 0.7–4.0)
MCH: 30.4 pg (ref 26.0–34.0)
MCHC: 33.6 g/dL (ref 30.0–36.0)
MCV: 90.5 fL (ref 80.0–100.0)
Monocytes Absolute: 1.2 10*3/uL — ABNORMAL HIGH (ref 0.1–1.0)
Monocytes Relative: 12 %
Neutro Abs: 7.8 10*3/uL — ABNORMAL HIGH (ref 1.7–7.7)
Neutrophils Relative %: 75 %
Platelets: 227 10*3/uL (ref 150–400)
RBC: 4.01 MIL/uL (ref 3.87–5.11)
RDW: 13.1 % (ref 11.5–15.5)
WBC: 10.4 10*3/uL (ref 4.0–10.5)
nRBC: 0 % (ref 0.0–0.2)

## 2019-07-19 LAB — CBG MONITORING, ED: Glucose-Capillary: 316 mg/dL — ABNORMAL HIGH (ref 70–99)

## 2019-07-19 LAB — HEPATIC FUNCTION PANEL
ALT: 12 U/L (ref 0–44)
AST: 11 U/L — ABNORMAL LOW (ref 15–41)
Albumin: 3.4 g/dL — ABNORMAL LOW (ref 3.5–5.0)
Alkaline Phosphatase: 50 U/L (ref 38–126)
Bilirubin, Direct: 0.1 mg/dL (ref 0.0–0.2)
Indirect Bilirubin: 0.8 mg/dL (ref 0.3–0.9)
Total Bilirubin: 0.9 mg/dL (ref 0.3–1.2)
Total Protein: 7 g/dL (ref 6.5–8.1)

## 2019-07-19 LAB — PROTIME-INR
INR: 1 (ref 0.8–1.2)
Prothrombin Time: 13.2 seconds (ref 11.4–15.2)

## 2019-07-19 LAB — PHOSPHORUS: Phosphorus: 2.9 mg/dL (ref 2.5–4.6)

## 2019-07-19 LAB — MAGNESIUM: Magnesium: 1.9 mg/dL (ref 1.7–2.4)

## 2019-07-19 LAB — SARS CORONAVIRUS 2 BY RT PCR (HOSPITAL ORDER, PERFORMED IN ~~LOC~~ HOSPITAL LAB): SARS Coronavirus 2: NEGATIVE

## 2019-07-19 LAB — TYPE AND SCREEN
ABO/RH(D): A POS
Antibody Screen: NEGATIVE

## 2019-07-19 MED ORDER — MAGNESIUM OXIDE 400 (241.3 MG) MG PO TABS
400.0000 mg | ORAL_TABLET | Freq: Every day | ORAL | Status: DC
  Administered 2019-07-21 – 2019-07-24 (×4): 400 mg via ORAL
  Filled 2019-07-19 (×4): qty 1

## 2019-07-19 MED ORDER — POTASSIUM CHLORIDE CRYS ER 20 MEQ PO TBCR
20.0000 meq | EXTENDED_RELEASE_TABLET | Freq: Once | ORAL | Status: AC
  Administered 2019-07-19: 20 meq via ORAL
  Filled 2019-07-19: qty 1

## 2019-07-19 MED ORDER — INSULIN DETEMIR 100 UNIT/ML ~~LOC~~ SOLN
15.0000 [IU] | Freq: Every day | SUBCUTANEOUS | Status: DC
  Administered 2019-07-20 – 2019-07-22 (×4): 15 [IU] via SUBCUTANEOUS
  Filled 2019-07-19 (×6): qty 0.15

## 2019-07-19 MED ORDER — SENNOSIDES-DOCUSATE SODIUM 8.6-50 MG PO TABS
1.0000 | ORAL_TABLET | Freq: Two times a day (BID) | ORAL | Status: DC
  Administered 2019-07-19 – 2019-07-24 (×8): 1 via ORAL
  Filled 2019-07-19 (×10): qty 1

## 2019-07-19 MED ORDER — METFORMIN HCL 500 MG PO TABS
1000.0000 mg | ORAL_TABLET | Freq: Two times a day (BID) | ORAL | Status: DC
  Administered 2019-07-21 – 2019-07-24 (×6): 1000 mg via ORAL
  Filled 2019-07-19 (×7): qty 2

## 2019-07-19 MED ORDER — INSULIN ASPART 100 UNIT/ML ~~LOC~~ SOLN
0.0000 [IU] | Freq: Three times a day (TID) | SUBCUTANEOUS | Status: DC
  Administered 2019-07-19: 11 [IU] via SUBCUTANEOUS
  Administered 2019-07-20: 5 [IU] via SUBCUTANEOUS
  Administered 2019-07-21: 8 [IU] via SUBCUTANEOUS
  Administered 2019-07-21: 11 [IU] via SUBCUTANEOUS
  Administered 2019-07-21: 15 [IU] via SUBCUTANEOUS
  Administered 2019-07-22: 3 [IU] via SUBCUTANEOUS
  Administered 2019-07-22: 2 [IU] via SUBCUTANEOUS
  Administered 2019-07-22: 5 [IU] via SUBCUTANEOUS
  Administered 2019-07-23 – 2019-07-24 (×3): 3 [IU] via SUBCUTANEOUS
  Administered 2019-07-24: 2 [IU] via SUBCUTANEOUS
  Filled 2019-07-19: qty 1

## 2019-07-19 MED ORDER — HEPARIN SODIUM (PORCINE) 5000 UNIT/ML IJ SOLN
5000.0000 [IU] | Freq: Three times a day (TID) | INTRAMUSCULAR | Status: DC
  Administered 2019-07-19: 5000 [IU] via SUBCUTANEOUS
  Filled 2019-07-19: qty 1

## 2019-07-19 MED ORDER — SENNOSIDES-DOCUSATE SODIUM 8.6-50 MG PO TABS: 1.0000 | ORAL_TABLET | Freq: Two times a day (BID) | ORAL | Status: DC

## 2019-07-19 MED ORDER — METHOCARBAMOL 500 MG PO TABS
500.0000 mg | ORAL_TABLET | Freq: Four times a day (QID) | ORAL | Status: DC | PRN
  Administered 2019-07-21: 500 mg via ORAL
  Filled 2019-07-19: qty 1

## 2019-07-19 MED ORDER — INSULIN ASPART 100 UNIT/ML ~~LOC~~ SOLN: 4.0000 [IU] | Freq: Once | SUBCUTANEOUS | Status: DC

## 2019-07-19 MED ORDER — KETOROLAC TROMETHAMINE 15 MG/ML IJ SOLN
15.0000 mg | Freq: Once | INTRAMUSCULAR | Status: AC
  Administered 2019-07-19: 15 mg via INTRAVENOUS
  Filled 2019-07-19: qty 1

## 2019-07-19 MED ORDER — DIVALPROEX SODIUM 125 MG PO CSDR
250.0000 mg | DELAYED_RELEASE_CAPSULE | Freq: Four times a day (QID) | ORAL | Status: DC
  Administered 2019-07-20 – 2019-07-24 (×13): 250 mg via ORAL
  Filled 2019-07-19 (×20): qty 2

## 2019-07-19 MED ORDER — POTASSIUM CHLORIDE IN NACL 40-0.9 MEQ/L-% IV SOLN
INTRAVENOUS | Status: AC
  Filled 2019-07-19 (×5): qty 1000

## 2019-07-19 MED ORDER — ASPIRIN EC 81 MG PO TBEC
81.0000 mg | DELAYED_RELEASE_TABLET | Freq: Every day | ORAL | Status: DC
  Administered 2019-07-21 – 2019-07-24 (×4): 81 mg via ORAL
  Filled 2019-07-19 (×4): qty 1

## 2019-07-19 MED ORDER — METHOCARBAMOL 1000 MG/10ML IJ SOLN
500.0000 mg | Freq: Four times a day (QID) | INTRAVENOUS | Status: DC | PRN
  Filled 2019-07-19: qty 5

## 2019-07-19 MED ORDER — FENTANYL CITRATE (PF) 100 MCG/2ML IJ SOLN
25.0000 ug | Freq: Once | INTRAMUSCULAR | Status: AC
  Administered 2019-07-19: 25 ug via INTRAVENOUS
  Filled 2019-07-19: qty 2

## 2019-07-19 MED ORDER — FENTANYL CITRATE (PF) 100 MCG/2ML IJ SOLN
25.0000 ug | INTRAMUSCULAR | Status: DC | PRN
  Administered 2019-07-20 (×2): 25 ug via INTRAVENOUS
  Filled 2019-07-19 (×2): qty 2

## 2019-07-19 MED ORDER — TRAZODONE HCL 50 MG PO TABS
50.0000 mg | ORAL_TABLET | Freq: Every day | ORAL | Status: DC
  Administered 2019-07-20 – 2019-07-22 (×3): 50 mg via ORAL
  Filled 2019-07-19 (×3): qty 1

## 2019-07-19 MED ORDER — HEPARIN SODIUM (PORCINE) 5000 UNIT/ML IJ SOLN
5000.0000 [IU] | Freq: Three times a day (TID) | INTRAMUSCULAR | Status: DC
Start: 2019-07-19 — End: 2019-07-19

## 2019-07-19 MED ORDER — DULOXETINE HCL 60 MG PO CPEP
60.0000 mg | ORAL_CAPSULE | Freq: Every morning | ORAL | Status: DC
  Administered 2019-07-21 – 2019-07-24 (×4): 60 mg via ORAL
  Filled 2019-07-19 (×4): qty 1

## 2019-07-19 MED ORDER — PANTOPRAZOLE SODIUM 40 MG PO TBEC
40.0000 mg | DELAYED_RELEASE_TABLET | Freq: Every day | ORAL | Status: DC
  Administered 2019-07-21 – 2019-07-24 (×5): 40 mg via ORAL
  Filled 2019-07-19 (×4): qty 1

## 2019-07-19 NOTE — H&P (Signed)
History and Physical    [name]:[id number] DOB: [birth year] DOA: 07/19/2019  PCP: Nanine Means   Patient coming from: Home.   I have personally briefly reviewed patient's old medical records in [place]  Chief Complaint: Fall.  HPI: Valerie Carr is a 86 y.o. female with medical history significant of arteriosclerotic cardiovascular disease, chronic anxiety and depression, dimension, type 2 diabetes, gallstones, hypertension, hyperlipidemia, sick sinus syndrome who is coming to the emergency department after sustaining an undetermined type of injury at the nursing home falling on her right hip area.  She has a history of dementia and is unable to contribute further to the HPI.  She is able to answer simple questions and answer no to headache, chest pain, abdominal pain.  She is stated that the hip hurts a little, but not too much.  ED Course: Initial vital signs were temperature 98.1 F, pulse 90, respirations 26, blood pressure 140/79 and O2 sat 98% on room air.  CBC showed a White count 7.4, hemoglobin 12.2 g/dL and platelets 227.  PT 13.2 and INR 1.0.  Chemistry shows a potassium of 3.4 mmol/L.  All other electrolytes are within normal range when calcium is corrected to albumin.  Glucose 328, BUN 30 and creatinine 0.75 mg/dL.  LFTs show an albumin of 3.4 g/dL, the rest of the measurements are unremarkable.  The patient was given 25 mcg of fentanyl.  I did Toradol 15 mg IVP x1 and 20 mEq of KCl p.o. x1.  Imaging: Right hip x-ray shows comminuted appearing intertrochanteric fracture of the right femoral neck.  Her chest radiograph does not have any active disease.  Review of Systems: As per HPI otherwise all other systems reviewed and are negative.  Past Medical History:  Diagnosis Date  . Arteriosclerotic cardiovascular disease (ASCVD)   . Chronic anxiety    and depression  . Dementia ([place])   . DM type 2 (diabetes mellitus, type 2) ([place])   . Gallstones     asysmptomatic  . HTN (hypertension)   . Hyperlipidemia   . Sick sinus syndrome (HCC)    s/p PPM   Past Surgical History:  Procedure Laterality Date  . ABDOMINAL SURGERY     12 inches taken out of "rotted bowel"  . APPENDECTOMY    . CORONARY ARTERY BYPASS GRAFT     07-11-2005, triple bypass  . ESOPHAGOGASTRODUODENOSCOPY    . LAPAROTOMY N/A 04/20/2016   Procedure: EXPLORATORY LAPAROTOMY, SMALL BOWEL RESECTION, LYSIS OF ADHESIONS;  Surgeon: Armandina Gemma, MD;  Location: [facility] ORS;  Service: General;  Laterality: N/A;  . PACEMAKER INSERTION  2004   [employer] PPM implanted by Dr Leonia Reeves, generator change by Dr Rayann Heman 01/07/11  . PERMANENT PACEMAKER GENERATOR CHANGE N/A 01/07/2011   Procedure: PERMANENT PACEMAKER GENERATOR CHANGE;  Surgeon: Thompson Grayer, MD;  Location: [facility] CATH LAB;  Service: Cardiovascular;  Laterality: N/A;  . TONSILLECTOMY    . TOTAL ABDOMINAL HYSTERECTOMY W/ BILATERAL SALPINGOOPHORECTOMY     Social History  reports that she quit smoking about 20 years ago. She has a 25.00 pack-year smoking history. She has quit using smokeless tobacco. She reports that she does not drink alcohol and does not use drugs.  Allergies  Allergen Reactions  . Clarithromycin Other (See Comments)    unknown  . Codeine Phosphate Other (See Comments)    unknown  . Effexor [Venlafaxine Hydrochloride] Other (See Comments)    unknown  . Fexofenadine Hcl Other (See Comments)    unknown  .  Lisinopril Other (See Comments)    unknown  . Prozac [Fluoxetine Hcl] Other (See Comments)    unknown  . Septra [Bactrim] Other (See Comments)    unknown  . Wellbutrin [Bupropion Hcl] Other (See Comments)    unknown   Family History  Problem Relation Age of Onset  . Diabetes Mother   . Hypertension Mother   . Coronary artery disease Father   . Diabetes Brother   . Throat cancer Brother   . Diabetes Brother   . Hypertension Sister   . Cancer Son        esophageal   Prior to Admission  medications   Medication Sig Start Date End Date Taking? Authorizing Provider  aspirin EC 81 MG tablet Take 81 mg by mouth daily. Swallow whole.   Yes [provider]  Cholecalciferol (VITAMIN D3) 50 MCG (2000 UT) TABS Take 2,000 Units by mouth every morning.   Yes [provider]  divalproex (DEPAKOTE SPRINKLE) 125 MG capsule Take 250 mg by mouth in the morning, at noon, in the evening, and at bedtime.    Yes [provider]  DULoxetine (CYMBALTA) 60 MG capsule Take 60 mg by mouth every morning. For depression   Yes [provider]  insulin aspart (NOVOLOG) 100 UNIT/ML injection Inject 4 Units into the skin 3 (three) times daily before meals.   Yes [provider]  insulin detemir (LEVEMIR) 100 UNIT/ML injection Inject 15 Units into the skin at bedtime.   Yes [provider]  metFORMIN (GLUCOPHAGE) 1000 MG tablet Take 1,000 mg by mouth 2 (two) times daily with a meal.    Yes [provider]  omeprazole (PRILOSEC) 20 MG capsule Take 20 mg by mouth daily at 6 (six) AM.   Yes [provider]  senna-docusate (SENOKOT S) 8.6-50 MG tablet Take 1 tablet by mouth 2 (two) times daily with a meal.   Yes [provider]  traZODone (DESYREL) 50 MG tablet Take 50 mg by mouth at bedtime.    Yes [provider]   Physical Exam: Vitals:   07/19/19 1900 07/19/19 2000 07/19/19 2030 07/19/19 2100  BP: 111/65 (!) 156/86 (!) 148/80 124/90  Pulse: (!) 101   (!) 105  Resp: 17 (!) 22  18  Temp:      TempSrc:      SpO2: 94%   96%  Weight:      Height:       Constitutional: NAD, calm, comfortable Eyes: PERRL, lids and conjunctivae normal ENMT: Mucous membranes are moist. Posterior pharynx clear of any exudate or lesions. Neck: normal, supple, no masses, no thyromegaly Respiratory: clear to auscultation bilaterally, no wheezing, no crackles. Normal respiratory effort. No accessory muscle use.  Cardiovascular: Irregularly  irregular at 102 bpm, no murmurs / rubs / gallops. No extremity edema. 2+ pedal pulses. No carotid bruits.  Abdomen: Nondistended.  Soft, no tenderness, no masses palpated. No hepatosplenomegaly. Bowel sounds positive.  Musculoskeletal: no clubbing / cyanosis.  Shortened and externally rotated RLE with loss of right hip ROM, no contractures. Normal muscle tone.  Skin: no rashes, lesions, ulcers on limited dermatological examination. Neurologic: CN 2-12 grossly intact. Sensation intact, DTR normal.  Generalized weakness. Psychiatric: Alert and oriented x 1, partially disoriented to place, disoriented to situation and date.  Labs on Admission: I have personally reviewed following labs and imaging studies  CBC: Recent Labs  Lab 07/19/19 1639  WBC 10.4  NEUTROABS 7.8*  HGB 12.2  HCT 36.3  MCV 90.5  PLT 227    Basic Metabolic Panel: Recent Labs  Lab 07/19/19 1639  NA 136  K 3.4*  CL 100  CO2 24  GLUCOSE 328*  BUN 30*  CREATININE 0.75  CALCIUM 8.7*  MG 1.9  PHOS 2.9   GFR: Estimated Creatinine Clearance: 35.6 mL/min (by C-G formula based on SCr of 0.75 mg/dL).  Liver Function Tests: Recent Labs  Lab 07/19/19 1639  AST 11*  ALT 12  ALKPHOS 50  BILITOT 0.9  PROT 7.0  ALBUMIN 3.4*   Radiological Exams on Admission: DG Chest Portable 1 View  Result Date: 07/19/2019 CLINICAL DATA:  86 year female with right hip fracture. Preop radiograph. EXAM: PORTABLE CHEST 1 VIEW COMPARISON:  Chest radiograph dated 10/19/2018. FINDINGS: No focal consolidation, pleural effusion, pneumothorax. The cardiac silhouette is within limits. Atherosclerotic calcification of the aorta. Median sternotomy wires, CABG vascular clips, and left pectoral pacemaker device. No acute osseous pathology. IMPRESSION: No active disease. Electronically Signed   By: Elgie Collard M.D.   On: 07/19/2019 18:29   DG Hip Unilat W or Wo Pelvis 2-3 Views Right  Result Date: 07/19/2019 CLINICAL DATA:   86 year old female with right hip pain. EXAM: DG HIP (WITH OR WITHOUT PELVIS) 2-3V RIGHT COMPARISON:  CT abdomen pelvis dated 10/18/2018. FINDINGS: There is a comminuted appearing intertrochanteric fracture of the right femoral neck. There is mild proximal migration of the femoral shaft with mild there is angulation. No dislocation. The bones are osteopenic. The soft tissues are unremarkable. Vascular calcifications noted. IMPRESSION: Comminuted appearing intertrochanteric fracture of the right femoral neck. Electronically Signed   By: Elgie Collard M.D.   On: 07/19/2019 18:28    EKG: Independently reviewed. Vent. rate 93 BPM PR interval * ms QRS duration 102 ms QT/QTc 345/430 ms P-R-T axes * -10 124 Atrial fibrillation Borderline repolarization abnormality  Assessment/Plan Principal Problem:   Closed fracture of neck of right femur (HCC) Admit to telemetry/inpatient. N.p.o. after midnight. Analgesics as needed. Buck's traction per protocol. Consult nutritional services. Consult TOC team. Orthopedic surgery will consult.  Active Problems:   Hypokalemia Correcting. Follow-up potassium level.    Coronary atherosclerosis of native coronary artery Continue aspirin. May benefit from low-dose beta-blocker. She is not getting treatment for hyperlipidemia.    Essential hypertension, benign Not on medical therapy. Monitor blood pressure. As needed treatment.    Chronic atrial fibrillation (HCC) CHA?DS?-VASc Score least 6. Not on anticoagulation or rate control meds. Diltiazem and order beta-blocker as needed.    Type 2 diabetes mellitus (HCC) Carbohydrate modified diet. N.p.o. after midnight. Hold Lantus tonight. Continue Metformin 1000 mg p.o. twice daily. CBG monitoring with RI SS. Check hemoglobin A1c.    Mixed hyperlipidemia Not on medical therapy.      GERD Continue omeprazole or formulary equivalent.    Alzheimer's disease (HCC) Continue Cymbalta 60 mg p.o.  every morning. Continue Depakote 250 mg p.o. 4 times daily. Continue trazodone 50 mg p.o. at bedtime. Supportive care.    DVT prophylaxis: Heparin SQ. Code Status:   Full code. Family Communication:  Her daughter was present in the ED room. Disposition Plan:   [facility].  Anticipated DC to:  [facility].  Anticipated DC date:  07/23/2019.  Anticipated DC barriers: Surgical intervention and postop improvement. Consults called:  Orthopedic surgery requested transfer to [facility]. Admission status:  Inpatient/telemetry.  Severity of Illness:  High to very high.  Bobette Mo MD [facility]  How to  contact the [facility] Attending or Consulting provider 7A - 7P or covering provider during after hours 7P -7A, for this patient?   1. Check the care team in [facility] and look for a) attending/consulting TRH provider listed and b) the [facility] team listed 2. Log into [email or web address] and use [place]'s universal password to access. If you do not have the password, please contact the hospital operator. 3. Locate the [facility] provider you are looking for under [facility] and page to a number that you can be directly reached. 4. If you still have difficulty reaching the provider, please page the [facility] (Director on Call) for the Hospitalists listed on amion for assistance.  07/19/2019, 10:10 PM   This document was prepared using Dragon voice recognition software and may contain some unintended transcription errors.

## 2019-07-19 NOTE — ED Provider Notes (Signed)
Emergency Department Provider Note   I have reviewed the triage vital signs and the nursing notes.   HISTORY  Chief Complaint Hip Pain   HPI CHETARA KROPP is a 86 y.o. female with past history reviewed below presents to the emergency department with right hip pain.  The patient is from [facility] with no known fall or other injury.  She has been having pain in the right leg especially with movement.  She is ambulatory at baseline per EMS.  She has dementia with baseline confusion here.  No meds given prior to arrival.  Staff at the facility noticed that the leg was shortened and externally rotated and called for transfer.   Level 5 caveat: Dementia    Past Medical History:  Diagnosis Date   Arteriosclerotic cardiovascular disease (ASCVD)    Chronic anxiety    and depression   Dementia (HCC)    DM type 2 (diabetes mellitus, type 2) (HCC)    Gallstones    asysmptomatic   HTN (hypertension)    Hyperlipidemia    Sick sinus syndrome (HCC)    s/p PPM    Patient Active Problem List   Diagnosis Date Noted   Heme positive stool    Hyperglycemia    COVID-19 virus infection    Rectal bleeding 10/18/2018   AAA (abdominal aortic aneurysm) (HCC) 10/18/2018   Dementia with behavioral disturbance (HCC)    Type 2 diabetes mellitus without complication, without Argie Applegate-term current use of insulin (HCC)    Acute lower UTI 09/04/2016   Acute cystitis without hematuria    AKI (acute kidney injury) (HCC) 09/03/2016   Chronic atrial fibrillation (HCC) 05/31/2014   Cardiac pacemaker in situ 05/31/2014   Coronary artery disease involving coronary bypass graft of native heart without angina pectoris 05/31/2014   Carotid artery stenosis, asymptomatic 05/31/2014   Sick sinus syndrome (HCC) 04/18/2013   Coronary atherosclerosis of native coronary artery 01/11/2013   Essential hypertension, benign 01/11/2013   Mixed hyperlipidemia 01/11/2013   Alzheimer's  disease (HCC) 09/02/2012   Bradycardia 01/06/2011    Past Surgical History:  Procedure Laterality Date   ABDOMINAL SURGERY     12 inches taken out of "rotted bowel"   APPENDECTOMY     CORONARY ARTERY BYPASS GRAFT     07-11-2005, triple bypass   ESOPHAGOGASTRODUODENOSCOPY     LAPAROTOMY N/A 04/20/2016   Procedure: EXPLORATORY LAPAROTOMY, SMALL BOWEL RESECTION, LYSIS OF ADHESIONS;  Surgeon: Darnell Level, MD;  Location: [facility];  Service: General;  Laterality: N/A;   PACEMAKER INSERTION  2004   [employer] PPM implanted by Dr Amil Amen, generator change by Dr Johney Frame 01/07/11   PERMANENT PACEMAKER GENERATOR CHANGE N/A 01/07/2011   Procedure: PERMANENT PACEMAKER GENERATOR CHANGE;  Surgeon: Hillis Range, MD;  Location: [facility] CATH LAB;  Service: Cardiovascular;  Laterality: N/A;   TONSILLECTOMY     TOTAL ABDOMINAL HYSTERECTOMY W/ BILATERAL SALPINGOOPHORECTOMY      Allergies Clarithromycin, Codeine phosphate, Effexor [venlafaxine hydrochloride], Fexofenadine hcl, Lisinopril, Prozac [fluoxetine hcl], Septra [bactrim], and Wellbutrin [bupropion hcl]  Family History  Problem Relation Age of Onset   Diabetes Mother    Hypertension Mother    Coronary artery disease Father    Diabetes Brother    Throat cancer Brother    Diabetes Brother    Hypertension Sister    Cancer Son        esophageal    Social History Social History   Tobacco Use   Smoking status: Former Smoker  Packs/day: 1.00    Years: 25.00    Pack years: 25.00    Quit date: 2001    Years since quitting: 20.4   Smokeless tobacco: Former Neurosurgeon  Substance Use Topics   Alcohol use: No   Drug use: No    Review of Systems  Level 5 caveat: Dementia   ____________________________________________   PHYSICAL EXAM:  VITAL SIGNS: Vitals:   07/19/19 1848 07/19/19 1849  BP:    Pulse: (!) 121 86  Resp: (!) 31 (!) 42  Temp:    SpO2: 97% 97%    Constitutional: Alert and pleasant but confused.    Eyes: Conjunctivae are normal.  Head: Atraumatic. Nose: No congestion/rhinnorhea. Mouth/Throat: Mucous membranes are moist.  Neck: No stridor. No cervical spine tenderness to palpation. Cardiovascular: Normal rate, regular rhythm. Good peripheral circulation. Grossly normal heart sounds.   Respiratory: Normal respiratory effort.  No retractions. Lungs CTAB. Gastrointestinal: Soft and nontender. No distention.  Musculoskeletal: RLE is shortened and externally rotated.  No tenderness over the right knee or ankle.  Pulses and sensation are intact.  Neurologic:  Normal speech and language. No gross focal neurologic deficits are appreciated.  Skin:  Skin is warm, dry and intact. No rash noted.   ____________________________________________   LABS (all labs ordered are listed, but only abnormal results are displayed)  Labs Reviewed  BASIC METABOLIC PANEL - Abnormal; Notable for the following components:      Result Value   Potassium 3.4 (*)    Glucose, Bld 328 (*)    BUN 30 (*)    Calcium 8.7 (*)    All other components within normal limits  CBC WITH DIFFERENTIAL/PLATELET - Abnormal; Notable for the following components:   Neutro Abs 7.8 (*)    Monocytes Absolute 1.2 (*)    All other components within normal limits  SARS CORONAVIRUS 2 BY RT PCR (HOSPITAL ORDER, PERFORMED IN [facility])  PROTIME-INR  TYPE AND SCREEN   ____________________________________________  EKG   EKG Interpretation  Date/Time:  Tuesday July 19 2019 18:16:38 EDT Ventricular Rate:  93 PR Interval:    QRS Duration: 102 QT Interval:  345 QTC Calculation: 430 R Axis:   -10 Text Interpretation: Atrial fibrillation Borderline repolarization abnormality No STEMI Confirmed by Alona Bene [phone]) on 07/19/2019 6:21:24 PM       ____________________________________________  RADIOLOGY  DG Chest Portable 1 View  Result Date: 07/19/2019 CLINICAL DATA:  86 year female with right hip  fracture. Preop radiograph. EXAM: PORTABLE CHEST 1 VIEW COMPARISON:  Chest radiograph dated 10/19/2018. FINDINGS: No focal consolidation, pleural effusion, pneumothorax. The cardiac silhouette is within limits. Atherosclerotic calcification of the aorta. Median sternotomy wires, CABG vascular clips, and left pectoral pacemaker device. No acute osseous pathology. IMPRESSION: No active disease. Electronically Signed   By: Elgie Collard M.D.   On: 07/19/2019 18:29   DG Hip Unilat W or Wo Pelvis 2-3 Views Right  Result Date: 07/19/2019 CLINICAL DATA:  86 year old female with right hip pain. EXAM: DG HIP (WITH OR WITHOUT PELVIS) 2-3V RIGHT COMPARISON:  CT abdomen pelvis dated 10/18/2018. FINDINGS: There is a comminuted appearing intertrochanteric fracture of the right femoral neck. There is mild proximal migration of the femoral shaft with mild there is angulation. No dislocation. The bones are osteopenic. The soft tissues are unremarkable. Vascular calcifications noted. IMPRESSION: Comminuted appearing intertrochanteric fracture of the right femoral neck. Electronically Signed   By: Elgie Collard M.D.   On: 07/19/2019 18:28  ____________________________________________   PROCEDURES  Procedure(s) performed:   Procedures  None  ____________________________________________   INITIAL IMPRESSION / ASSESSMENT AND PLAN / ED COURSE  Pertinent labs & imaging results that were available during my care of the patient were reviewed by me and considered in my medical decision making (see chart for details).   Patient presents to the emergency department from [facility] with right hip pain.  The extremity is shortened and rotated.  Pulses and sensation are intact.  Will obtain plain films of the right hip to evaluate for fracture versus dislocation. No knee or ankle tenderness.   Hip imaging reviewed showing right comminuted intertrochanteric hip fracture.  Ordered chest x-ray and preoperative labs  along with EKG which is interpreted as above.  Discussed the case with the patient's daughter who is at bedside who is in agreement with plan. Confirms that patient is ambulatory at baseline with a walker. No anticoagulation. No outpatient orthopedist. Will page unassigned ortho for consult.   07:00 PM  Spoke with Dr. Griffin Basil with orthopedics.  He will see the patient at [facility] under the hospitalist service.  Patient to be n.p.o. after midnight for likely OR tomorrow.  Discussed patient's case with [facility] to request admission. Patient and family (if present) updated with plan. Care transferred to [facility] service.  I reviewed all nursing notes, vitals, pertinent old records, EKGs, labs, imaging (as available).   ____________________________________________  FINAL CLINICAL IMPRESSION(S) / ED DIAGNOSES  Final diagnoses:  Closed fracture of right hip, initial encounter ([place])    MEDICATIONS GIVEN DURING THIS VISIT:  Medications  fentaNYL (SUBLIMAZE) injection 25 mcg (25 mcg Intravenous Given 07/19/19 1646)     Note:  This document was prepared using Dragon voice recognition software and may include unintentional dictation errors.  Nanda Quinton, MD, [facility] Emergency Medicine    Ariele Vidrio, Wonda Olds, MD 07/20/19 [id number]

## 2019-07-19 NOTE — Consult Note (Signed)
Case posted at [facility] main for Dr. Jena Gauss for tomorrow to treat R intertroch femur fracture, recommend transfer to hospitalist, NPO midnight, covid test 2 hr variant.

## 2019-07-19 NOTE — ED Triage Notes (Signed)
Pt brought to ED via [facility] from [facility] for right hip pain. Per staff, pt started complaining of right hip pain started this am. Unsure of injury. Rotation noted to right leg.

## 2019-07-20 ENCOUNTER — Inpatient Hospital Stay (HOSPITAL_COMMUNITY): Payer: Medicare Other | Admitting: Certified Registered Nurse Anesthetist

## 2019-07-20 ENCOUNTER — Inpatient Hospital Stay (HOSPITAL_COMMUNITY): Payer: Medicare Other

## 2019-07-20 ENCOUNTER — Encounter (HOSPITAL_COMMUNITY)
Admission: EM | Disposition: A | Discharge status: Skilled Nursing Facility | Payer: Self-pay | Source: Skilled Nursing Facility | Attending: Internal Medicine

## 2019-07-20 ENCOUNTER — Encounter (HOSPITAL_COMMUNITY): Payer: Self-pay | Admitting: Internal Medicine

## 2019-07-20 HISTORY — PX: INTRAMEDULLARY (IM) NAIL INTERTROCHANTERIC: SHX5875

## 2019-07-20 LAB — CBC WITH DIFFERENTIAL/PLATELET
Abs Immature Granulocytes: 0.02 10*3/uL (ref 0.00–0.07)
Basophils Absolute: 0.1 10*3/uL (ref 0.0–0.1)
Basophils Relative: 1 %
Eosinophils Absolute: 0.3 10*3/uL (ref 0.0–0.5)
Eosinophils Relative: 4 %
HCT: 34.2 % — ABNORMAL LOW (ref 36.0–46.0)
Hemoglobin: 11.4 g/dL — ABNORMAL LOW (ref 12.0–15.0)
Immature Granulocytes: 0 %
Lymphocytes Relative: 22 %
Lymphs Abs: 1.7 10*3/uL (ref 0.7–4.0)
MCH: 30.4 pg (ref 26.0–34.0)
MCHC: 33.3 g/dL (ref 30.0–36.0)
MCV: 91.2 fL (ref 80.0–100.0)
Monocytes Absolute: 0.9 10*3/uL (ref 0.1–1.0)
Monocytes Relative: 12 %
Neutro Abs: 4.6 10*3/uL (ref 1.7–7.7)
Neutrophils Relative %: 61 %
Platelets: 243 10*3/uL (ref 150–400)
RBC: 3.75 MIL/uL — ABNORMAL LOW (ref 3.87–5.11)
RDW: 13.1 % (ref 11.5–15.5)
WBC: 7.6 10*3/uL (ref 4.0–10.5)
nRBC: 0 % (ref 0.0–0.2)

## 2019-07-20 LAB — MAGNESIUM: Magnesium: 1.9 mg/dL (ref 1.7–2.4)

## 2019-07-20 LAB — COMPREHENSIVE METABOLIC PANEL
ALT: 12 U/L (ref 0–44)
AST: 11 U/L — ABNORMAL LOW (ref 15–41)
Albumin: 2.9 g/dL — ABNORMAL LOW (ref 3.5–5.0)
Alkaline Phosphatase: 45 U/L (ref 38–126)
Anion gap: 10 (ref 5–15)
BUN: 35 mg/dL — ABNORMAL HIGH (ref 8–23)
CO2: 24 mmol/L (ref 22–32)
Calcium: 8.8 mg/dL — ABNORMAL LOW (ref 8.9–10.3)
Chloride: 107 mmol/L (ref 98–111)
Creatinine, Ser: 0.69 mg/dL (ref 0.44–1.00)
GFR calc Af Amer: >60 mL/min (ref >60)
GFR calc non Af Amer: >60 mL/min (ref >60)
Glucose, Bld: 111 mg/dL — ABNORMAL HIGH (ref 70–99)
Potassium: 3.9 mmol/L (ref 3.5–5.1)
Sodium: 141 mmol/L (ref 135–145)
Total Bilirubin: 1.3 mg/dL — ABNORMAL HIGH (ref 0.3–1.2)
Total Protein: 6 g/dL — ABNORMAL LOW (ref 6.5–8.1)

## 2019-07-20 LAB — GLUCOSE, CAPILLARY
Glucose-Capillary: 231 mg/dL — ABNORMAL HIGH (ref 70–99)
Glucose-Capillary: 85 mg/dL (ref 70–99)
Glucose-Capillary: 124 mg/dL — ABNORMAL HIGH (ref 70–99)
Glucose-Capillary: 185 mg/dL — ABNORMAL HIGH (ref 70–99)
Glucose-Capillary: 251 mg/dL — ABNORMAL HIGH (ref 70–99)

## 2019-07-20 LAB — PHOSPHORUS: Phosphorus: 3.8 mg/dL (ref 2.5–4.6)

## 2019-07-20 LAB — SURGICAL PCR SCREEN
MRSA, PCR: NEGATIVE
Staphylococcus aureus: POSITIVE — AB

## 2019-07-20 SURGERY — FIXATION, FRACTURE, INTERTROCHANTERIC, WITH INTRAMEDULLARY ROD
Anesthesia: General | Laterality: Right

## 2019-07-20 MED ORDER — ORAL CARE MOUTH RINSE: 15.0000 mL | Freq: Once | OROMUCOSAL | Status: DC

## 2019-07-20 MED ORDER — CHLORHEXIDINE GLUCONATE CLOTH 2 % EX PADS
6.0000 | MEDICATED_PAD | Freq: Every day | CUTANEOUS | Status: DC
  Administered 2019-07-20 – 2019-07-23 (×4): 6 via TOPICAL

## 2019-07-20 MED ORDER — LACTATED RINGERS IV SOLN: INTRAVENOUS | Status: DC | PRN

## 2019-07-20 MED ORDER — GLUCERNA SHAKE PO LIQD
237.0000 mL | Freq: Three times a day (TID) | ORAL | Status: DC
  Administered 2019-07-21 – 2019-07-24 (×9): 237 mL via ORAL

## 2019-07-20 MED ORDER — CHLORHEXIDINE GLUCONATE 4 % EX LIQD: 60.0000 mL | Freq: Once | CUTANEOUS | Status: DC

## 2019-07-20 MED ORDER — ONDANSETRON HCL 4 MG/2ML IJ SOLN
INTRAMUSCULAR | Status: AC
  Filled 2019-07-20: qty 2

## 2019-07-20 MED ORDER — FENTANYL CITRATE (PF) 250 MCG/5ML IJ SOLN
INTRAMUSCULAR | Status: DC | PRN
  Administered 2019-07-20 (×2): 50 ug via INTRAVENOUS

## 2019-07-20 MED ORDER — VANCOMYCIN HCL 1000 MG IV SOLR
INTRAVENOUS | Status: AC
  Filled 2019-07-20: qty 1000

## 2019-07-20 MED ORDER — ONDANSETRON HCL 4 MG PO TABS: 4.0000 mg | ORAL_TABLET | Freq: Four times a day (QID) | ORAL | Status: DC | PRN

## 2019-07-20 MED ORDER — PROPOFOL 10 MG/ML IV BOLUS
INTRAVENOUS | Status: DC | PRN
  Administered 2019-07-20: 50 mg via INTRAVENOUS

## 2019-07-20 MED ORDER — ONDANSETRON HCL 4 MG/2ML IJ SOLN: 4.0000 mg | Freq: Four times a day (QID) | INTRAMUSCULAR | Status: DC | PRN

## 2019-07-20 MED ORDER — CEFAZOLIN SODIUM-DEXTROSE 2-4 GM/100ML-% IV SOLN
2.0000 g | Freq: Three times a day (TID) | INTRAVENOUS | Status: AC
  Administered 2019-07-20 – 2019-07-21 (×3): 2 g via INTRAVENOUS
  Filled 2019-07-20 (×3): qty 100

## 2019-07-20 MED ORDER — CHLORHEXIDINE GLUCONATE 0.12 % MT SOLN
15.0000 mL | Freq: Once | OROMUCOSAL | Status: DC
  Filled 2019-07-20: qty 15

## 2019-07-20 MED ORDER — FENTANYL CITRATE (PF) 250 MCG/5ML IJ SOLN
INTRAMUSCULAR | Status: AC
  Filled 2019-07-20: qty 5

## 2019-07-20 MED ORDER — ACETAMINOPHEN 325 MG PO TABS
325.0000 mg | ORAL_TABLET | Freq: Four times a day (QID) | ORAL | Status: DC | PRN
  Administered 2019-07-21: 650 mg via ORAL
  Filled 2019-07-20: qty 2

## 2019-07-20 MED ORDER — ENOXAPARIN SODIUM 40 MG/0.4ML ~~LOC~~ SOLN
40.0000 mg | SUBCUTANEOUS | Status: DC
  Administered 2019-07-21 – 2019-07-24 (×4): 40 mg via SUBCUTANEOUS
  Filled 2019-07-20 (×4): qty 0.4

## 2019-07-20 MED ORDER — LIDOCAINE 2% (20 MG/ML) 5 ML SYRINGE
INTRAMUSCULAR | Status: DC | PRN
  Administered 2019-07-20: 60 mg via INTRAVENOUS

## 2019-07-20 MED ORDER — PHENYLEPHRINE HCL-NACL 10-0.9 MG/250ML-% IV SOLN
INTRAVENOUS | Status: DC | PRN
  Administered 2019-07-20: 25 ug/min via INTRAVENOUS

## 2019-07-20 MED ORDER — MENTHOL 3 MG MT LOZG: 1.0000 | LOZENGE | OROMUCOSAL | Status: DC | PRN

## 2019-07-20 MED ORDER — LIDOCAINE 2% (20 MG/ML) 5 ML SYRINGE
INTRAMUSCULAR | Status: AC
  Filled 2019-07-20: qty 5

## 2019-07-20 MED ORDER — SUCCINYLCHOLINE CHLORIDE 200 MG/10ML IV SOSY
PREFILLED_SYRINGE | INTRAVENOUS | Status: DC | PRN
  Administered 2019-07-20: 100 mg via INTRAVENOUS

## 2019-07-20 MED ORDER — DEXAMETHASONE SODIUM PHOSPHATE 10 MG/ML IJ SOLN
INTRAMUSCULAR | Status: AC
  Filled 2019-07-20: qty 1

## 2019-07-20 MED ORDER — VASOPRESSIN 20 UNIT/ML IV SOLN
INTRAVENOUS | Status: AC
  Filled 2019-07-20: qty 1

## 2019-07-20 MED ORDER — FENTANYL CITRATE (PF) 100 MCG/2ML IJ SOLN
INTRAMUSCULAR | Status: AC
  Filled 2019-07-20: qty 2

## 2019-07-20 MED ORDER — METOCLOPRAMIDE HCL 5 MG PO TABS: 5.0000 mg | ORAL_TABLET | Freq: Three times a day (TID) | ORAL | Status: DC | PRN

## 2019-07-20 MED ORDER — DOCUSATE SODIUM 100 MG PO CAPS
100.0000 mg | ORAL_CAPSULE | Freq: Two times a day (BID) | ORAL | Status: DC
  Administered 2019-07-20 – 2019-07-24 (×6): 100 mg via ORAL
  Filled 2019-07-20 (×7): qty 1

## 2019-07-20 MED ORDER — POVIDONE-IODINE 10 % EX SWAB: 2.0000 "application " | Freq: Once | CUTANEOUS | Status: DC

## 2019-07-20 MED ORDER — VASOPRESSIN 20 UNIT/ML IV SOLN
INTRAVENOUS | Status: DC | PRN
Start: 2019-07-20 — End: 2019-07-20
  Administered 2019-07-20: 2 [IU] via INTRAVENOUS

## 2019-07-20 MED ORDER — ONDANSETRON HCL 4 MG/2ML IJ SOLN: 4.0000 mg | Freq: Once | INTRAMUSCULAR | Status: DC | PRN

## 2019-07-20 MED ORDER — PHENYLEPHRINE 40 MCG/ML (10ML) SYRINGE FOR IV PUSH (FOR BLOOD PRESSURE SUPPORT)
PREFILLED_SYRINGE | INTRAVENOUS | Status: AC
  Filled 2019-07-20: qty 10

## 2019-07-20 MED ORDER — LACTATED RINGERS IV SOLN: INTRAVENOUS | Status: DC

## 2019-07-20 MED ORDER — PHENOL 1.4 % MT LIQD: 1.0000 | OROMUCOSAL | Status: DC | PRN

## 2019-07-20 MED ORDER — FENTANYL CITRATE (PF) 100 MCG/2ML IJ SOLN
25.0000 ug | INTRAMUSCULAR | Status: DC | PRN
  Administered 2019-07-20 (×3): 25 ug via INTRAVENOUS

## 2019-07-20 MED ORDER — DEXAMETHASONE SODIUM PHOSPHATE 10 MG/ML IJ SOLN
INTRAMUSCULAR | Status: DC | PRN
  Administered 2019-07-20: 5 mg via INTRAVENOUS

## 2019-07-20 MED ORDER — PHENYLEPHRINE 40 MCG/ML (10ML) SYRINGE FOR IV PUSH (FOR BLOOD PRESSURE SUPPORT)
PREFILLED_SYRINGE | INTRAVENOUS | Status: DC | PRN
  Administered 2019-07-20: 40 ug via INTRAVENOUS
  Administered 2019-07-20 (×3): 120 ug via INTRAVENOUS

## 2019-07-20 MED ORDER — DEXTROSE-NACL 5-0.9 % IV SOLN: INTRAVENOUS | Status: DC

## 2019-07-20 MED ORDER — ADULT MULTIVITAMIN W/MINERALS CH
1.0000 | ORAL_TABLET | Freq: Every day | ORAL | Status: DC
  Administered 2019-07-21 – 2019-07-24 (×4): 1 via ORAL
  Filled 2019-07-20 (×5): qty 1

## 2019-07-20 MED ORDER — CEFAZOLIN SODIUM-DEXTROSE 2-4 GM/100ML-% IV SOLN
2.0000 g | INTRAVENOUS | Status: AC
  Administered 2019-07-20: 2 g via INTRAVENOUS
  Filled 2019-07-20: qty 100

## 2019-07-20 MED ORDER — 0.9 % SODIUM CHLORIDE (POUR BTL) OPTIME
TOPICAL | Status: DC | PRN
  Administered 2019-07-20: 1000 mL

## 2019-07-20 MED ORDER — METOCLOPRAMIDE HCL 5 MG/ML IJ SOLN: 5.0000 mg | Freq: Three times a day (TID) | INTRAMUSCULAR | Status: DC | PRN

## 2019-07-20 MED ORDER — ONDANSETRON HCL 4 MG/2ML IJ SOLN
INTRAMUSCULAR | Status: DC | PRN
  Administered 2019-07-20: 4 mg via INTRAVENOUS

## 2019-07-20 MED ORDER — MUPIROCIN 2 % EX OINT
1.0000 "application " | TOPICAL_OINTMENT | Freq: Two times a day (BID) | CUTANEOUS | Status: DC
  Administered 2019-07-20 – 2019-07-23 (×7): 1 via NASAL
  Filled 2019-07-20: qty 22

## 2019-07-20 MED ORDER — VANCOMYCIN HCL 1000 MG IV SOLR
INTRAVENOUS | Status: DC | PRN
  Administered 2019-07-20: 1000 mg via TOPICAL

## 2019-07-20 MED ORDER — IBUPROFEN 200 MG PO TABS
600.0000 mg | ORAL_TABLET | Freq: Four times a day (QID) | ORAL | Status: DC | PRN
  Administered 2019-07-21: 600 mg via ORAL
  Filled 2019-07-20: qty 3

## 2019-07-20 SURGICAL SUPPLY — 53 items
ADH SKN CLS APL DERMABOND .7 (GAUZE/BANDAGES/DRESSINGS) ×1
ADH SKN CLS LQ APL DERMABOND (GAUZE/BANDAGES/DRESSINGS) ×1
APL PRP STRL LF DISP 70% ISPRP (MISCELLANEOUS) ×1
BIT DRILL INTERTAN LAG SCREW (BIT) ×2 IMPLANT
BIT DRILL LONG 4.0 (BIT) IMPLANT
BRUSH SCRUB EZ PLAIN DRY (MISCELLANEOUS) ×6 IMPLANT
CHLORAPREP W/TINT 26 (MISCELLANEOUS) ×3 IMPLANT
COVER PERINEAL POST (MISCELLANEOUS) ×3 IMPLANT
COVER SURGICAL LIGHT HANDLE (MISCELLANEOUS) ×3 IMPLANT
COVER WAND RF STERILE (DRAPES) IMPLANT
DERMABOND ADHESIVE PROPEN (GAUZE/BANDAGES/DRESSINGS) ×2
DERMABOND ADVANCED (GAUZE/BANDAGES/DRESSINGS) ×2
DERMABOND ADVANCED .7 DNX12 (GAUZE/BANDAGES/DRESSINGS) ×1 IMPLANT
DERMABOND ADVANCED .7 DNX6 (GAUZE/BANDAGES/DRESSINGS) IMPLANT
DRAPE C-ARM 35X43 STRL (DRAPES) ×3 IMPLANT
DRAPE IMP U-DRAPE 54X76 (DRAPES) ×6 IMPLANT
DRAPE INCISE IOBAN 66X45 STRL (DRAPES) ×3 IMPLANT
DRAPE STERI IOBAN 125X83 (DRAPES) ×3 IMPLANT
DRAPE SURG 17X23 STRL (DRAPES) ×6 IMPLANT
DRAPE U-SHAPE 47X51 STRL (DRAPES) ×3 IMPLANT
DRILL BIT LONG 4.0 (BIT) ×3
DRSG MEPILEX BORDER 4X4 (GAUZE/BANDAGES/DRESSINGS) ×3 IMPLANT
DRSG MEPILEX BORDER 4X8 (GAUZE/BANDAGES/DRESSINGS) ×3 IMPLANT
ELECT REM PT RETURN 9FT ADLT (ELECTROSURGICAL) ×3
ELECTRODE REM PT RTRN 9FT ADLT (ELECTROSURGICAL) ×1 IMPLANT
GLOVE BIO SURGEON STRL SZ 6.5 (GLOVE) ×6 IMPLANT
GLOVE BIO SURGEON STRL SZ7.5 (GLOVE) ×12 IMPLANT
GLOVE BIO SURGEONS STRL SZ 6.5 (GLOVE) ×3
GLOVE BIOGEL PI IND STRL 6.5 (GLOVE) ×1 IMPLANT
GLOVE BIOGEL PI IND STRL 7.5 (GLOVE) ×1 IMPLANT
GLOVE BIOGEL PI INDICATOR 6.5 (GLOVE) ×2
GLOVE BIOGEL PI INDICATOR 7.5 (GLOVE) ×2
GOWN STRL REUS W/ TWL LRG LVL3 (GOWN DISPOSABLE) ×1 IMPLANT
GOWN STRL REUS W/TWL LRG LVL3 (GOWN DISPOSABLE) ×3
GUIDE PIN 3.2X343 (PIN) ×3
GUIDE PIN 3.2X343MM (PIN) ×9
KIT BASIN OR (CUSTOM PROCEDURE TRAY) ×3 IMPLANT
KIT TURNOVER KIT B (KITS) ×3 IMPLANT
MANIFOLD NEPTUNE II (INSTRUMENTS) ×3 IMPLANT
NAIL INTERTAN 10X18 130D 10S (Nail) ×2 IMPLANT
NS IRRIG 1000ML POUR BTL (IV SOLUTION) ×3 IMPLANT
PACK GENERAL/GYN (CUSTOM PROCEDURE TRAY) ×3 IMPLANT
PAD ARMBOARD 7.5X6 YLW CONV (MISCELLANEOUS) ×6 IMPLANT
PIN GUIDE 3.2X343MM (PIN) IMPLANT
SCREW LAG COMPR KIT 100/95 (Screw) ×2 IMPLANT
SCREW TRIGEN LOW PROF 5.0X35 (Screw) ×2 IMPLANT
SUT MNCRL AB 3-0 PS2 18 (SUTURE) ×3 IMPLANT
SUT VIC AB 0 CT1 27 (SUTURE)
SUT VIC AB 0 CT1 27XBRD ANBCTR (SUTURE) IMPLANT
SUT VIC AB 2-0 CT1 27 (SUTURE) ×6
SUT VIC AB 2-0 CT1 TAPERPNT 27 (SUTURE) ×2 IMPLANT
TOWEL GREEN STERILE (TOWEL DISPOSABLE) ×6 IMPLANT
WATER STERILE IRR 1000ML POUR (IV SOLUTION) ×3 IMPLANT

## 2019-07-20 NOTE — Progress Notes (Signed)
Patient's visitor has expressed multiple complaints Including room temperature, her mom is hungry and hasn't eaten since last night, nobody care about the elderly and don't believe they feel pain, she has a rash under her breasts,  This RN (me), stated I was confused as to the patient's care plan and was going to discharge her today.  I have tried to educate visitor on course of stay/care following surgery and I am very aware she has surgery scheduled today.  I have also reinforced that patient is NPO and that she cannot have anything to eat or drink til after surgery and then we will see she has a diet order and a meal upon her return.   When I was unhooking telemetry monitor and leads so patient could go to Xray, patient was grabbing my hands and didn't want me to move the covers so I could unhook them, multiple times I showed the patient the monitor and kept reassuring her I was just taking off the leads, I was able to get a quick look at redness at her breast area.  I told visitor that I would let the MD know about that, as well when she got back from surgery and was more relaxed, we could assess that skin better.  Visitor acknowledged that would be a good plan.

## 2019-07-20 NOTE — Plan of Care (Signed)

## 2019-07-20 NOTE — Consult Note (Signed)
Reason for Consult:Right hip fx Referring Physician: [name] is an 86 y.o. female.  HPI: Valerie Carr was brought to the ED from the SNF where she resides c/o right hip pain and bruising. The SNF staff do not know exactly what happened but said she didn't fall. The patient has advanced dementia and cannot relate either. X-rays showed a right hip fx and orthopedic surgery was consulted. Orthopedic trauma consultation was requested by the original consulting surgeon.  Past Medical History:  Diagnosis Date  . Arteriosclerotic cardiovascular disease (ASCVD)   . Chronic anxiety    and depression  . Dementia (HCC)   . DM type 2 (diabetes mellitus, type 2) (HCC)   . Gallstones    asysmptomatic  . HTN (hypertension)   . Hyperlipidemia   . Sick sinus syndrome (HCC)    s/p PPM    Past Surgical History:  Procedure Laterality Date  . ABDOMINAL SURGERY     12 inches taken out of "rotted bowel"  . APPENDECTOMY    . CORONARY ARTERY BYPASS GRAFT     07-11-2005, triple bypass  . ESOPHAGOGASTRODUODENOSCOPY    . LAPAROTOMY N/A 04/20/2016   Procedure: EXPLORATORY LAPAROTOMY, SMALL BOWEL RESECTION, LYSIS OF ADHESIONS;  Surgeon: Darnell Level, MD;  Location: [facility];  Service: General;  Laterality: N/A;  . PACEMAKER INSERTION  2004   [employer] PPM implanted by Dr Amil Amen, generator change by Dr Johney Frame 01/07/11  . PERMANENT PACEMAKER GENERATOR CHANGE N/A 01/07/2011   Procedure: PERMANENT PACEMAKER GENERATOR CHANGE;  Surgeon: Hillis Range, MD;  Location: [facility] CATH LAB;  Service: Cardiovascular;  Laterality: N/A;  . TONSILLECTOMY    . TOTAL ABDOMINAL HYSTERECTOMY W/ BILATERAL SALPINGOOPHORECTOMY      Family History  Problem Relation Age of Onset  . Diabetes Mother   . Hypertension Mother   . Coronary artery disease Father   . Diabetes Brother   . Throat cancer Brother   . Diabetes Brother   . Hypertension Sister   . Cancer Son        esophageal    Social History:  reports  that she quit smoking about 20 years ago. She has a 25.00 pack-year smoking history. She has quit using smokeless tobacco. She reports that she does not drink alcohol and does not use drugs.  Allergies:  Allergies  Allergen Reactions  . Clarithromycin Other (See Comments)    unknown  . Codeine Phosphate Other (See Comments)    unknown  . Effexor [Venlafaxine Hydrochloride] Other (See Comments)    unknown  . Fexofenadine Hcl Other (See Comments)    unknown  . Lisinopril Other (See Comments)    unknown  . Prozac [Fluoxetine Hcl] Other (See Comments)    unknown  . Septra [Bactrim] Other (See Comments)    unknown  . Wellbutrin [Bupropion Hcl] Other (See Comments)    unknown    Medications: I have reviewed the patient's current medications.  Results for orders placed or performed during the hospital encounter of 07/19/19 (from the past 48 hour(s))  Basic metabolic panel     Status: Abnormal   Collection Time: 07/19/19  4:39 PM  Result Value Ref Range   Sodium 136 135 - 145 mmol/L   Potassium 3.4 (L) 3.5 - 5.1 mmol/L   Chloride 100 98 - 111 mmol/L   CO2 24 22 - 32 mmol/L   Glucose, Bld 328 (H) 70 - 99 mg/dL    Comment: Glucose reference range applies only to samples taken  after fasting for at least 8 hours.   BUN 30 (H) 8 - 23 mg/dL   Creatinine, Ser 0.75 0.44 - 1.00 mg/dL   Calcium 8.7 (L) 8.9 - 10.3 mg/dL   GFR calc non Af Amer >60 >60 mL/min   GFR calc Af Amer >60 >60 mL/min   Anion gap 12 5 - 15    Comment: Performed at [facility], [address], [place], [place] [address]  CBC with Differential     Status: Abnormal   Collection Time: 07/19/19  4:39 PM  Result Value Ref Range   WBC 10.4 4.0 - 10.5 K/uL   RBC 4.01 3.87 - 5.11 MIL/uL   Hemoglobin 12.2 12.0 - 15.0 g/dL   HCT 36.3 36 - 46 %   MCV 90.5 80.0 - 100.0 fL   MCH 30.4 26.0 - 34.0 pg   MCHC 33.6 30.0 - 36.0 g/dL   RDW 13.1 11.5 - 15.5 %   Platelets 227 150 - 400 K/uL   nRBC 0.0 0.0 - 0.2 %   Neutrophils  Relative % 75 %   Neutro Abs 7.8 (H) 1.7 - 7.7 K/uL   Lymphocytes Relative 11 %   Lymphs Abs 1.2 0.7 - 4.0 K/uL   Monocytes Relative 12 %   Monocytes Absolute 1.2 (H) 0 - 1 K/uL   Eosinophils Relative 1 %   Eosinophils Absolute 0.1 0 - 0 K/uL   Basophils Relative 1 %   Basophils Absolute 0.1 0 - 0 K/uL   Immature Granulocytes 0 %   Abs Immature Granulocytes 0.04 0.00 - 0.07 K/uL    Comment: Performed at [facility], [address], [place], [place] [address]  Protime-INR     Status: None   Collection Time: 07/19/19  4:39 PM  Result Value Ref Range   Prothrombin Time 13.2 11.4 - 15.2 seconds   INR 1.0 0.8 - 1.2    Comment: (NOTE) INR goal varies based on device and disease states. Performed at [facility], [address], [place], [place] [address]   Magnesium     Status: None   Collection Time: 07/19/19  4:39 PM  Result Value Ref Range   Magnesium 1.9 1.7 - 2.4 mg/dL    Comment: Performed at [facility], [address] [place], [place] [address]  Phosphorus     Status: None   Collection Time: 07/19/19  4:39 PM  Result Value Ref Range   Phosphorus 2.9 2.5 - 4.6 mg/dL    Comment: Performed at [facility], [address], [place], [place] [address]  Hepatic function panel     Status: Abnormal   Collection Time: 07/19/19  4:39 PM  Result Value Ref Range   Total Protein 7.0 6.5 - 8.1 g/dL   Albumin 3.4 (L) 3.5 - 5.0 g/dL   AST 11 (L) 15 - 41 U/L   ALT 12 0 - 44 U/L   Alkaline Phosphatase 50 38 - 126 U/L   Total Bilirubin 0.9 0.3 - 1.2 mg/dL   Bilirubin, Direct 0.1 0.0 - 0.2 mg/dL   Indirect Bilirubin 0.8 0.3 - 0.9 mg/dL    Comment: Performed at [facility], [address] [place], [place] [address]  SARS Coronavirus 2 by RT PCR (hospital order, performed in [place] hospital lab) Nasopharyngeal Nasopharyngeal Swab     Status: None   Collection Time: 07/19/19  5:41 PM   Specimen: Nasopharyngeal Swab  Result Value Ref Range   SARS Coronavirus 2 NEGATIVE NEGATIVE  Comment: (NOTE) SARS-CoV-2 target nucleic acids are NOT DETECTED.  The SARS-CoV-2 RNA is generally detectable in upper and lower respiratory specimens during the acute phase of infection. The lowest concentration of SARS-CoV-2 viral copies this assay can detect is 250 copies / mL. A negative result does not preclude SARS-CoV-2 infection and should not be used as the sole basis for treatment or other patient management decisions.  A negative result may occur with improper specimen collection / handling, submission of specimen other than nasopharyngeal swab, presence of viral mutation(s) within the areas targeted by this assay, and inadequate number of viral copies (<250 copies / mL). A negative result must be combined with clinical observations, patient history, and epidemiological information.  Fact Sheet for Patients:   BoilerBrush.com.cy  Fact Sheet for Healthcare Providers: https://pope.com/  This test is not yet approved or  cleared by the [place] FDA and has been authorized for detection and/or diagnosis of SARS-CoV-2 by FDA under an Emergency Use Authorization (EUA).  This EUA will remain in effect (meaning this test can be used) for the duration of the COVID-19 declaration under Section 564(b)(1) of the Act, 21 U.S.C. section 360bbb-3(b)(1), unless the authorization is terminated or revoked sooner.  Performed at [facility], [address], [place], [place] [address]   Type and screen [facility]     Status: None   Collection Time: 07/19/19  5:51 PM  Result Value Ref Range   ABO/RH(D) A POS    Antibody Screen NEG    Sample Expiration      07/22/2019,2359 Performed at [facility], [address] [place], [place] [address]   CBG monitoring, ED     Status: Abnormal   Collection Time: 07/19/19  7:36 PM  Result Value Ref Range   Glucose-Capillary 316 (H) 70 - 99 mg/dL    Comment: Glucose reference range  applies only to samples taken after fasting for at least 8 hours.  Surgical pcr screen     Status: Abnormal   Collection Time: 07/20/19  2:20 AM   Specimen: Nasal Mucosa; Nasal Swab  Result Value Ref Range   MRSA, PCR NEGATIVE NEGATIVE   Staphylococcus aureus POSITIVE (A) NEGATIVE    Comment: CRITICAL RESULT CALLED TO, READ BACK BY AND VERIFIED WITH: RN Forestine Na [phone] @[id number]  THANEY Performed at [facility], [address] [place], [place]   Glucose, capillary     Status: Abnormal   Collection Time: 07/20/19  7:27 AM  Result Value Ref Range   Glucose-Capillary 231 (H) 70 - 99 mg/dL    Comment: Glucose reference range applies only to samples taken after fasting for at least 8 hours.    DG Chest Portable 1 View  Result Date: 07/19/2019 CLINICAL DATA:  86 year female with right hip fracture. Preop radiograph. EXAM: PORTABLE CHEST 1 VIEW COMPARISON:  Chest radiograph dated 10/19/2018. FINDINGS: No focal consolidation, pleural effusion, pneumothorax. The cardiac silhouette is within limits. Atherosclerotic calcification of the aorta. Median sternotomy wires, CABG vascular clips, and left pectoral pacemaker device. No acute osseous pathology. IMPRESSION: No active disease. Electronically Signed   By: 10/21/2018 M.D.   On: 07/19/2019 18:29   DG Hip Unilat W or Wo Pelvis 2-3 Views Right  Result Date: 07/19/2019 CLINICAL DATA:  86 year old female with right hip pain. EXAM: DG HIP (WITH OR WITHOUT PELVIS) 2-3V RIGHT COMPARISON:  CT abdomen pelvis dated 10/18/2018. FINDINGS: There is a comminuted appearing intertrochanteric fracture of the right femoral neck.  There is mild proximal migration of the femoral shaft with mild there is angulation. No dislocation. The bones are osteopenic. The soft tissues are unremarkable. Vascular calcifications noted. IMPRESSION: Comminuted appearing intertrochanteric fracture of the right femoral neck. Electronically Signed   By:  Elgie Collard M.D.   On: 07/19/2019 18:28    Review of Systems  Unable to perform ROS: Dementia   Blood pressure 103/82, pulse 98, temperature 98.6 F (37 C), temperature source Axillary, resp. rate 18, height 5\' 8"  (1.727 m), weight 57.5 kg, SpO2 98 %. Physical Exam  Constitutional: She appears well-developed. No distress.  HENT:  Head: Normocephalic and atraumatic.  Eyes: Conjunctivae are normal. Right eye exhibits no discharge. Left eye exhibits no discharge. No scleral icterus.  Cardiovascular: Normal rate and regular rhythm.  Respiratory: Effort normal. No respiratory distress.  Musculoskeletal:     Cervical back: Normal range of motion.     Comments: RLE No traumatic wounds, ecchymosis, or rash  TTP hip/knee  No knee or ankle effusion  Knee stable to varus/ valgus and anterior/posterior stress  Sens DPN, SPN, TN could not assess  Motor EHL, ext, flex, evers grossly intact  DP 2+, PT 1+, No significant edema  Neurological: She is alert.  Skin: Skin is warm and dry. She is not diaphoretic.  Psychiatric: Her behavior is normal.    Assessment/Plan: Right hip fx -- Plan IMN later today by Dr. . Please keep NPO. Will check knee x-ray given tenderness. Multiple medical problems including arteriosclerotic cardiovascular disease, chronic anxiety and depression, dementia, type 2 diabetes, gallstones, hypertension, hyperlipidemia, and sick sinus syndrome -- per primary service    Jena Gauss, PA-C Orthopedic Surgery [phone] 07/20/2019, 9:15 AM

## 2019-07-20 NOTE — Progress Notes (Signed)
Initial Nutrition Assessment  DOCUMENTATION CODES:   Not applicable  INTERVENTION:   -Once diet is advanced, add:  -Glucerna Shake po TID, each supplement provides 220 kcal and 10 grams of protein -MVI with minerals daily  NUTRITION DIAGNOSIS:   Increased nutrient needs related to post-op healing as evidenced by estimated needs.  GOAL:   Patient will meet greater than or equal to 90% of their needs  MONITOR:   PO intake, Supplement acceptance, Diet advancement, Labs, Weight trends, Skin, I & O's  REASON FOR ASSESSMENT:   Consult Assessment of nutrition requirement/status  ASSESSMENT:   Valerie Carr is a 86 y.o. female with medical history significant of arteriosclerotic cardiovascular disease, chronic anxiety and depression, dimension, type 2 diabetes, gallstones, hypertension, hyperlipidemia, sick sinus syndrome who is coming to the emergency department after sustaining an undetermined type of injury at the nursing home falling on her right hip area.  Pt admitted with rt hip fracture.   Reviewed I/O's: 757 ml x 24 hours  Pt lying in bed at time of visit. Due to pt's dementia, history was provided by pt's daughter Juliann Pulse). She reports that pt has been residing at [facility] for the past years. She reports pt typically has a great appetite, but usually consumes softer foods due to lack of teeth. Pt also often snacks on snack cakes and sweets throughout the day.   Pt daughter does not think pt has lost weight. Reviewed wt hx; pt has experienced a 4.2% wt loss over the past 9 months, which is not significant for time frame.   Lab Results  Component Value Date   HGBA1C 7.6 (H) 10/19/2018   PTA DM medications are 15 units insulin detemir daily at bedtime, 4 units insulin aspart TID with meals, and 1000 mg metformin BID. Per ADA's Standards of Medical Care of Diabetes, glycemic targets for older adults who have multiple co-morbidities, cognitive impairments, and  functional dependence should be less stringent (Hgb A1c <8.0-8.5). Pt daughter reports concern over hyperglycemia; reviewed DM treatment plan with her and discussed that DM is typically managed more aggressively in the hospital due to stress related hyperglycemia. Pt daughter more at ease after this conversation and knowing the CBGS have been trending down since admission.  Labs reviewed: CBGS: 316 (inpatient orders for glycemic control are 0-15 units insulin aspart TID with meals, 15 units insulin detemir daily at bedtime, and 100 mg metformin BID).   NUTRITION - FOCUSED PHYSICAL EXAM:    Most Recent Value  Orbital Region Mild depletion  Upper Arm Region No depletion  Thoracic and Lumbar Region No depletion  Buccal Region No depletion  Temple Region Mild depletion  Clavicle Bone Region Mild depletion  Clavicle and Acromion Bone Region No depletion  Scapular Bone Region No depletion  Dorsal Hand Mild depletion  Patellar Region No depletion  Anterior Thigh Region No depletion  Posterior Calf Region No depletion  Edema (RD Assessment) Mild  Hair Reviewed  Eyes Reviewed  Mouth Reviewed  Skin Reviewed  Nails Reviewed       Diet Order:   Diet Order            Diet NPO time specified  Diet effective now                 EDUCATION NEEDS:   Education needs have been addressed  Skin:  Skin Assessment: Reviewed RN Assessment  Last BM:  07/18/19  Height:   Ht Readings from Last 1 Encounters:  07/20/19  5\' 8"  (1.727 m)    Weight:   Wt Readings from Last 1 Encounters:  07/20/19 57.5 kg    Ideal Body Weight:  63.6 kg  BMI:  Body mass index is 19.27 kg/m.  Estimated Nutritional Needs:   Kcal:  1500-1700  Protein:  75-90 grams  Fluid:  > 1.5 L    07/22/19, RD, LDN, CDCES Registered Dietitian II Certified Diabetes Care and Education Specialist Please refer to [facility] for RD and/or RD on-call/weekend/after hours pager

## 2019-07-20 NOTE — Progress Notes (Signed)
Upon start of shift, entered patient's room and patient's visitor was verbally aggressive.  Visitor was speaking very loudly toward housekeeper regarding room temperature and how "awful it was" and when I entered immediately began with she wanted to know what was going on, nobody had told her anything".  I attempted to introduce myself and was cut off, she stated she was the "POA and just wanted to know what was going on", I said that patient was scheduled for surgery today, she then said "she isn't going anywhere, Nobody has talked to me and I am her executor".  I attempted to explain that the consent had not been signed and she would have the opportunity to talk to both surgeon and anesthesiology prior to consenting and if she needed she could go to preop to speak to them prior to signing anything.  I asked her to wait a minute and let me get my report sheet so I could go over what information I had, I returned and said I have she is going to a facility at discharge and was going to discuss patient's history, when visitor started saying "she wasn't going anywhere, her hip is broken", I said yes, she has to go to surgery first and that is as far as I could discuss with patient.  She started to talking to her "mom" and dismissed me.  This was all witnessed by [place], housekeeper.

## 2019-07-20 NOTE — Progress Notes (Signed)
PROGRESS NOTE    Valerie Carr  VPX:[id number] DOB: 04-Sep-1929 DOA: 07/19/2019 PCP: Lindaann Pascal     Brief Narrative:  86 y.o. WF PMHx arteriosclerotic, CAD, HTN, Sick Sinus Syndrome, HLD, chronic Anxiety and Depression, dimension, DM type II, cholelithiasis,   who is coming to the emergency department after sustaining an undetermined type of injury at the nursing home falling on her right hip area.  She has a history of dementia and is unable to contribute further to the HPI.  She is able to answer simple questions and answer no to headache, chest pain, abdominal pain.  She is stated that the hip hurts a little, but not too much.  ED Course: Initial vital signs were temperature 98.1 F, pulse 90, respirations 26, blood pressure 140/79 and O2 sat 98% on room air.  CBC showed a White count 7.4, hemoglobin 12.2 g/dL and platelets 462.  PT 13.2 and INR 1.0.  Chemistry shows a potassium of 3.4 mmol/L.  All other electrolytes are within normal range when calcium is corrected to albumin.  Glucose 328, BUN 30 and creatinine 0.75 mg/dL.  LFTs show an albumin of 3.4 g/dL, the rest of the measurements are unremarkable.  The patient was given 25 mcg of fentanyl.  I did Toradol 15 mg IVP x1 and 20 mEq of KCl p.o. x1.  Imaging: Right hip x-ray shows comminuted appearing intertrochanteric fracture of the right femoral neck.  Her chest radiograph does not have any active disease.   Subjective: Attempted to evaluate patient x2 both times in surgery.  No charge   Assessment & Plan: Covid vaccination;   Principal Problem:   Closed fracture of neck of right femur (HCC) Active Problems:   Alzheimer's disease (HCC)   Coronary atherosclerosis of native coronary artery   Essential hypertension, benign   Mixed hyperlipidemia   Chronic atrial fibrillation (HCC)   Type 2 diabetes mellitus (HCC)   Hypokalemia   Closed fracture of neck of right femur (HCC) Admit to telemetry/inpatient. N.p.o.  after midnight. Analgesics as needed. Buck's traction per protocol. Consult nutritional services. Consult TOC team. Orthopedic surgery will consult.  Active Problems:   Hypokalemia Correcting. Follow-up potassium level.    Coronary atherosclerosis of native coronary artery Continue aspirin. May benefit from low-dose beta-blocker. She is not getting treatment for hyperlipidemia.    Essential hypertension, benign Not on medical therapy. Monitor blood pressure. As needed treatment.    Chronic atrial fibrillation (HCC) CHA?DS?-VASc Score least 6. Not on anticoagulation or rate control meds. Diltiazem and order beta-blocker as needed.    Type 2 diabetes mellitus (HCC) Carbohydrate modified diet. N.p.o. after midnight. Hold Lantus tonight. Continue Metformin 1000 mg p.o. twice daily. CBG monitoring with RI SS. Check hemoglobin A1c.    Mixed hyperlipidemia Not on medical therapy.      GERD Continue omeprazole or formulary equivalent.    Alzheimer's disease (HCC) Continue Cymbalta 60 mg p.o. every morning. Continue Depakote 250 mg p.o. 4 times daily. Continue trazodone 50 mg p.o. at bedtime. Supportive care.   DVT prophylaxis:  Code Status:  Family Communication:  Status is: Inpatient    Dispo: The patient is from:               Anticipated d/c is to:               Anticipated d/c date is:               Patient currently  Consultants:    Procedures/Significant Events:    I have personally reviewed and interpreted all radiology studies and my findings are as above.  VENTILATOR SETTINGS:    Cultures   Antimicrobials:    Devices    LINES / TUBES:      Continuous Infusions:  methocarbamol (ROBAXIN) IV       Objective: Vitals:   07/19/19 2300 07/20/19 0035 07/20/19 0200 07/20/19 0513  BP: 108/83 (!) 146/63  103/82  Pulse:  91  98  Resp:  17  18  Temp:    98.6 F (37 C)  TempSrc:    Axillary  SpO2:  94%  98%    Weight:   57.5 kg   Height:   5\' 8"  (1.727 m)     Intake/Output Summary (Last 24 hours) at 07/20/2019 07/22/2019 Last data filed at 07/20/2019 0450 Gross per 24 hour  Intake 757.1 ml  Output --  Net 757.1 ml   Filed Weights   07/19/19 1610 07/20/19 0200  Weight: 54.4 kg 57.5 kg    Examination:  Attempted to evaluate patient x2 patient remains in surgery no charge .     Data Reviewed: Care during the described time interval was provided by me .  I have reviewed this patient's available data, including medical history, events of note, physical examination, and all test results as part of my evaluation.  CBC: Recent Labs  Lab 07/19/19 1639  WBC 10.4  NEUTROABS 7.8*  HGB 12.2  HCT 36.3  MCV 90.5  PLT 227   Basic Metabolic Panel: Recent Labs  Lab 07/19/19 1639  NA 136  K 3.4*  CL 100  CO2 24  GLUCOSE 328*  BUN 30*  CREATININE 0.75  CALCIUM 8.7*  MG 1.9  PHOS 2.9   GFR: Estimated Creatinine Clearance: 45 mL/min (by C-G formula based on SCr of 0.75 mg/dL). Liver Function Tests: Recent Labs  Lab 07/19/19 1639  AST 11*  ALT 12  ALKPHOS 50  BILITOT 0.9  PROT 7.0  ALBUMIN 3.4*   No results for input(s): LIPASE, AMYLASE in the last 168 hours. No results for input(s): AMMONIA in the last 168 hours. Coagulation Profile: Recent Labs  Lab 07/19/19 1639  INR 1.0   Cardiac Enzymes: No results for input(s): CKTOTAL, CKMB, CKMBINDEX, TROPONINI in the last 168 hours. BNP (last 3 results) No results for input(s): PROBNP in the last 8760 hours. HbA1C: No results for input(s): HGBA1C in the last 72 hours. CBG: Recent Labs  Lab 07/19/19 1936 07/20/19 0727  GLUCAP 316* 231*   Lipid Profile: No results for input(s): CHOL, HDL, LDLCALC, TRIG, CHOLHDL, LDLDIRECT in the last 72 hours. Thyroid Function Tests: No results for input(s): TSH, T4TOTAL, FREET4, T3FREE, THYROIDAB in the last 72 hours. Anemia Panel: No results for input(s): VITAMINB12, FOLATE, FERRITIN,  TIBC, IRON, RETICCTPCT in the last 72 hours. Sepsis Labs: No results for input(s): PROCALCITON, LATICACIDVEN in the last 168 hours.  Recent Results (from the past 240 hour(s))  SARS Coronavirus 2 by RT PCR (hospital order, performed in [facility] hospital lab) Nasopharyngeal Nasopharyngeal Swab     Status: None   Collection Time: 07/19/19  5:41 PM   Specimen: Nasopharyngeal Swab  Result Value Ref Range Status   SARS Coronavirus 2 NEGATIVE NEGATIVE Final    Comment: (NOTE) SARS-CoV-2 target nucleic acids are NOT DETECTED.  The SARS-CoV-2 RNA is generally detectable in upper and lower respiratory specimens during the acute phase of infection. The lowest concentration of SARS-CoV-2  viral copies this assay can detect is 250 copies / mL. A negative result does not preclude SARS-CoV-2 infection and should not be used as the sole basis for treatment or other patient management decisions.  A negative result may occur with improper specimen collection / handling, submission of specimen other than nasopharyngeal swab, presence of viral mutation(s) within the areas targeted by this assay, and inadequate number of viral copies (<250 copies / mL). A negative result must be combined with clinical observations, patient history, and epidemiological information.  Fact Sheet for Patients:   BoilerBrush.com.cy  Fact Sheet for Healthcare Providers: https://pope.com/  This test is not yet approved or  cleared by the [place] FDA and has been authorized for detection and/or diagnosis of SARS-CoV-2 by FDA under an Emergency Use Authorization (EUA).  This EUA will remain in effect (meaning this test can be used) for the duration of the COVID-19 declaration under Section 564(b)(1) of the Act, 21 U.S.C. section 360bbb-3(b)(1), unless the authorization is terminated or revoked sooner.  Performed at [facility], [address] [place], [place]  [address]   Surgical pcr screen     Status: Abnormal   Collection Time: 07/20/19  2:20 AM   Specimen: Nasal Mucosa; Nasal Swab  Result Value Ref Range Status   MRSA, PCR NEGATIVE NEGATIVE Final   Staphylococcus aureus POSITIVE (A) NEGATIVE Final    Comment: CRITICAL RESULT CALLED TO, READ BACK BY AND VERIFIED WITH: RN Forestine Na [id number] @[id number]  THANEY Performed at [facility], [address], [place], [place]          Radiology Studies: DG Chest Portable 1 View  Result Date: 07/19/2019 CLINICAL DATA:  86 year female with right hip fracture. Preop radiograph. EXAM: PORTABLE CHEST 1 VIEW COMPARISON:  Chest radiograph dated 10/19/2018. FINDINGS: No focal consolidation, pleural effusion, pneumothorax. The cardiac silhouette is within limits. Atherosclerotic calcification of the aorta. Median sternotomy wires, CABG vascular clips, and left pectoral pacemaker device. No acute osseous pathology. IMPRESSION: No active disease. Electronically Signed   By: 10/21/2018 M.D.   On: 07/19/2019 18:29   DG Hip Unilat W or Wo Pelvis 2-3 Views Right  Result Date: 07/19/2019 CLINICAL DATA:  86 year old female with right hip pain. EXAM: DG HIP (WITH OR WITHOUT PELVIS) 2-3V RIGHT COMPARISON:  CT abdomen pelvis dated 10/18/2018. FINDINGS: There is a comminuted appearing intertrochanteric fracture of the right femoral neck. There is mild proximal migration of the femoral shaft with mild there is angulation. No dislocation. The bones are osteopenic. The soft tissues are unremarkable. Vascular calcifications noted. IMPRESSION: Comminuted appearing intertrochanteric fracture of the right femoral neck. Electronically Signed   By: 10/20/2018 M.D.   On: 07/19/2019 18:28        Scheduled Meds:  aspirin EC  81 mg Oral Daily   Chlorhexidine Gluconate Cloth  6 each Topical Daily   divalproex  250 mg Oral QID   DULoxetine  60 mg Oral q morning - 10a   heparin  5,000 Units  Subcutaneous Q8H   insulin aspart  0-15 Units Subcutaneous TID WC   insulin aspart  4 Units Subcutaneous Once   insulin detemir  15 Units Subcutaneous QHS   magnesium oxide  400 mg Oral Daily   metFORMIN  1,000 mg Oral BID WC   mupirocin ointment  1 application Nasal BID   pantoprazole  40 mg Oral Daily   senna-docusate  1 tablet Oral BID   traZODone  50 mg  Oral QHS   Continuous Infusions:  methocarbamol (ROBAXIN) IV       LOS: 1 day    Time spent:40 min    Caylon Saine, Geraldo Docker, MD [facility] Pager [phone]  If 7PM-7AM, please contact night-coverage [email or web address] Password [email or web address] 07/20/2019, 9:22 AM

## 2019-07-20 NOTE — Transfer of Care (Signed)
Immediate Anesthesia Transfer of Care Note  Patient: Valerie Carr  Procedure(s) Performed: INTRAMEDULLARY (IM) NAIL INTERTROCHANTRIC (Right )  Patient Location: PACU  Anesthesia Type:General  Level of Consciousness: drowsy and patient cooperative  Airway & Oxygen Therapy: Patient Spontanous Breathing and Patient connected to face mask oxygen  Post-op Assessment: Report given to RN and Post -op Vital signs reviewed and stable  Post vital signs: Reviewed and stable  Last Vitals:  Vitals Value Taken Time  BP 149/82 07/20/19 1821  Temp    Pulse 83 07/20/19 1823  Resp 20 07/20/19 1823  SpO2 100 % 07/20/19 1823  Vitals shown include unvalidated device data.  Last Pain:  Vitals:   07/20/19 0923  TempSrc: Oral         Complications: No complications documented.

## 2019-07-20 NOTE — Anesthesia Preprocedure Evaluation (Addendum)
Anesthesia Evaluation  Patient identified by MRN, date of birth, ID band Patient unresponsive    Reviewed: Allergy & Precautions, NPO status , Patient's Chart, lab work & pertinent test results  Airway Mallampati: II  TM Distance: >3 FB Neck ROM: Full    Dental  (+) Dental Advisory Given, Edentulous Upper, Edentulous Lower   Pulmonary former smoker,    Pulmonary exam normal breath sounds clear to auscultation       Cardiovascular hypertension, + CAD and + CABG  Normal cardiovascular examDysrhythmias: Sick sinus syndrome  + pacemaker  Rhythm:Regular Rate:Normal     Neuro/Psych PSYCHIATRIC DISORDERS Anxiety Dementia CVA    GI/Hepatic Neg liver ROS, GERD  Medicated,  Endo/Other  diabetes, Type 2, Insulin Dependent, Oral Hypoglycemic Agents  Renal/GU negative Renal ROS     Musculoskeletal negative musculoskeletal ROS (+)   Abdominal   Peds  Hematology  (+) Blood dyscrasia, anemia ,   Anesthesia Other Findings Day of surgery medications reviewed with the patient.  Reproductive/Obstetrics                            Anesthesia Physical Anesthesia Plan  ASA: III  Anesthesia Plan: General   Post-op Pain Management:    Induction: Intravenous  PONV Risk Score and Plan: 3 and Dexamethasone, Ondansetron and Treatment may vary due to age or medical condition  Airway Management Planned: Oral ETT  Additional Equipment:   Intra-op Plan:   Post-operative Plan: Extubation in OR  Informed Consent: I have reviewed the patients History and Physical, chart, labs and discussed the procedure including the risks, benefits and alternatives for the proposed anesthesia with the patient or authorized representative who has indicated his/her understanding and acceptance.   Patient has DNR.  Continue DNR and Discussed DNR with power of attorney.   Dental advisory given  Plan Discussed with:  CRNA  Anesthesia Plan Comments:        Anesthesia Quick Evaluation

## 2019-07-20 NOTE — Progress Notes (Signed)
Received call from front desk, Informed that there were 2 visitors downstairs to see patient, Walked into patient's room to ask visitor [facility] [name]), whom can come up to see patient and discuss visitation policy and [name] stated that Marylouise Stacks can come upstairs because that is her sister, the other visitor downstairs was [facility] daughter and didn't need to come see her.  I explained in great detail and the patient was able to reiterate back to me what the policy was.  She did say that "somebody was going to get their feelings hurt" and stated 'I am not going to have this".   I also asked visitor if she would like a password for callers and she said no, they can go through her.    Received 2nd call from front desk informing me that I needed to change the names on the visitor list because [name] had been downstairs and stated Wilma wasn't supposed to be in room with patient however, I allowed her to come up.  Lura Em is on way from [place] and she wants [name] to be able to see her too.  Aram Beecham charge nurse is present during conversation upon [facility] return to unit and was asked about Marylouise Stacks being in room visitng, [name] then stated "that is yall's fault she is in there, not mine".

## 2019-07-20 NOTE — Op Note (Signed)
Orthopaedic Surgery Operative Note (CSN: [id number] ) Date of Surgery: 07/20/2019  Admit Date: 07/19/2019   Diagnoses: Pre-Op Diagnoses: Right intertrochanteric femur fracture  Post-Op Diagnosis: Same  Procedures: CPT 27245-Cephalomedullary nailing of right intertrochanteric femur fracture  Surgeons : Primary: Roby Lofts, MD  Assistant: Darron Doom, RNFA  Location: OR 6   Anesthesia:General  Antibiotics: Ancef 2g preop with 1 gm vancomycin powder topically   Tourniquet time:None  Estimated Blood Loss:50 mL  Complications:None   Specimens:None   Implants: Implant Name Type Inv. Item Serial No. Manufacturer Lot No. LRB No. Used Action  NAIL INTERTAN 10X18 130D 10S - [id number] Nail NAIL INTERTAN 10X18 130D 10S  [employer] [id number] Right 1 Implanted  SCREW LAG TRIGEN INTERLOCK - [id number] Screw SCREW LAG TRIGEN INTERLOCK  [employer] [id number] Right 1 Implanted  SCREW TRIGEN LOW PROF 5.0X35 - [id number] Screw SCREW TRIGEN LOW PROF 5.0X35  [employer] [id number] Right 1 Implanted     Indications for Surgery: 86 year old female with a history of dementia that sustained a fall at her skilled nursing facility.  She had a right intertrochanteric femur fracture.  Due to the unstable nature of her injury I recommended proceeding with cephalomedullary nailing of the right hip fracture.  Risks and benefits were discussed with the patient's daughter.  Risks included but not limited to bleeding, infection, malunion, nonunion, hardware failure, hardware irritation, nerve and blood vessel injury, DVT, even the possibility of anesthetic complications.  The patient's daughter agreed to proceed with surgery and consent was obtained.  Operative Findings: Cephalomedullary nailing of right intertrochanteric femur fracture using [employer] InterTAN 10 x 180 mm nail  Procedure: The patient was identified in the preoperative holding area.  Consent was confirmed with the patient and their family and all questions were answered. The operative extremity was marked after confirmation with the patient. she was then brought back to the operating room by our anesthesia colleagues.  She was placed under general anesthetic and carefully positioned on a Hana table.  All bony prominences were well-padded.  Fluoroscopic imaging was obtained to show the unstable nature of her injury.  Traction was applied to the lower extremity to align the fracture appropriately.  Once adequate reduction was obtained the right lower extremity was prepped and draped in usual sterile fashion.  A timeout was performed to verify the patient, the procedure, and the extremity.  Preoperative antibiotics were dosed.  I made a percutaneous incision proximal to the greater trochanter.  I split the gluteal fascia in line with my incision.  I then directed a threaded guidewire at the tip of the trochanter into the proximal femur.  I confirmed positioning with AP and lateral fluoroscopic imaging.  I then used an entry reamer to enter the medullary canal.  I then placed the nail down the center of the canal and aligned it appropriately on the AP and lateral view.  I then made a lateral incision along the femur to place a threaded guidewire into the head neck segment to confirm my tip apex distance.  Once I confirmed this I then measured the length and chose to use 100 mm lag screw.  I then drilled the path for the compression screw.  I placed an antirotation bar and then I drilled the path for the lag screw.  The lag screw was placed.  The compression screw was used to provide about 5 mm of compression.  The screw interface was then statically locked  to prevent migration.  A distal interlock was placed using the targeting arm.  Final fluoroscopic imaging was obtained.  The incisions were copiously irrigated.  A gram of vancomycin powder was placed into the incisions.  A layer closure of  2-0 Vicryl, 3-0 Monocryl and Dermabond was used to close the skin.  Sterile dressings were placed.  The patient was then transferred to a regular bed and awoken from anesthesia and taken to the PACU in stable condition.  Post Op Plan/Instructions: The patient will be weightbearing as tolerated to the right lower extremity.  She will receive postoperative Ancef.  She will be started on Lovenox for DVT prophylaxis starting tomorrow.  We will mobilize her with physical and Occupational Therapy.  She will likely discharge back to a skilled nursing facility.  I was present and performed the entire surgery.  Katha Hamming, MD [facility]

## 2019-07-20 NOTE — Anesthesia Procedure Notes (Signed)
Procedure Name: Intubation Date/Time: 07/20/2019 5:17 PM Performed by: Modena Morrow, CRNA Pre-anesthesia Checklist: Patient identified, Emergency Drugs available, Suction available and Patient being monitored Patient Re-evaluated:Patient Re-evaluated prior to induction Oxygen Delivery Method: Circle system utilized Preoxygenation: Pre-oxygenation with 100% oxygen Induction Type: IV induction Ventilation: Mask ventilation without difficulty Laryngoscope Size: Miller and 2 Grade View: Grade I Tube type: Oral Tube size: 7.0 mm Number of attempts: 1 Airway Equipment and Method: Stylet and Oral airway Placement Confirmation: ETT inserted through vocal cords under direct vision,  positive ETCO2 and breath sounds checked- equal and bilateral Secured at: 22 cm Tube secured with: Tape Dental Injury: Teeth and Oropharynx as per pre-operative assessment

## 2019-07-20 NOTE — ED Notes (Signed)
Pt's daughter, [name], informed pt will be going to [facility] tonight.

## 2019-07-20 NOTE — Anesthesia Postprocedure Evaluation (Signed)
Anesthesia Post Note  Patient: Valerie Carr  Procedure(s) Performed: INTRAMEDULLARY (IM) NAIL INTERTROCHANTRIC (Right )     Patient location during evaluation: PACU Anesthesia Type: General Level of consciousness: responds to stimulation and patient cooperative Pain management: pain level controlled Vital Signs Assessment: post-procedure vital signs reviewed and stable Respiratory status: spontaneous breathing, nonlabored ventilation, respiratory function stable and patient connected to nasal cannula oxygen Cardiovascular status: blood pressure returned to baseline and stable Postop Assessment: no apparent nausea or vomiting Anesthetic complications: no   No complications documented.  Last Vitals:  Vitals:   07/20/19 2000 07/20/19 2015  BP: (!) 148/78 (!) 117/91  Pulse:    Resp:    Temp:    SpO2:      Last Pain:  Vitals:   07/20/19 1845  TempSrc:   PainSc: Asleep                 Dorcus Riga

## 2019-07-20 NOTE — Progress Notes (Signed)
Pt arrived to [facility] via [facility]. Patient is alert and oriented to self only. Pt made comfortable in bed, call bell within reach, bed alarm on, and bed on the lowest setting.

## 2019-07-21 ENCOUNTER — Encounter (HOSPITAL_COMMUNITY): Payer: Self-pay | Admitting: Student

## 2019-07-21 DIAGNOSIS — E782 Mixed hyperlipidemia: Secondary | ICD-10-CM

## 2019-07-21 DIAGNOSIS — I482 Chronic atrial fibrillation, unspecified: Secondary | ICD-10-CM

## 2019-07-21 DIAGNOSIS — I10 Essential (primary) hypertension: Secondary | ICD-10-CM

## 2019-07-21 DIAGNOSIS — E876 Hypokalemia: Secondary | ICD-10-CM

## 2019-07-21 DIAGNOSIS — G308 Other Alzheimer's disease: Secondary | ICD-10-CM

## 2019-07-21 DIAGNOSIS — I2510 Atherosclerotic heart disease of native coronary artery without angina pectoris: Secondary | ICD-10-CM

## 2019-07-21 DIAGNOSIS — B3789 Other sites of candidiasis: Secondary | ICD-10-CM | POA: Diagnosis present

## 2019-07-21 DIAGNOSIS — F0281 Dementia in other diseases classified elsewhere with behavioral disturbance: Secondary | ICD-10-CM

## 2019-07-21 LAB — CBC WITH DIFFERENTIAL/PLATELET
Abs Immature Granulocytes: 0.04 10*3/uL (ref 0.00–0.07)
Basophils Absolute: 0 10*3/uL (ref 0.0–0.1)
Basophils Relative: 0 %
Eosinophils Absolute: 0 10*3/uL (ref 0.0–0.5)
Eosinophils Relative: 0 %
HCT: 31.8 % — ABNORMAL LOW (ref 36.0–46.0)
Hemoglobin: 10.6 g/dL — ABNORMAL LOW (ref 12.0–15.0)
Immature Granulocytes: 1 %
Lymphocytes Relative: 11 %
Lymphs Abs: 0.9 10*3/uL (ref 0.7–4.0)
MCH: 30.6 pg (ref 26.0–34.0)
MCHC: 33.3 g/dL (ref 30.0–36.0)
MCV: 91.9 fL (ref 80.0–100.0)
Monocytes Absolute: 1 10*3/uL (ref 0.1–1.0)
Monocytes Relative: 12 %
Neutro Abs: 6.2 10*3/uL (ref 1.7–7.7)
Neutrophils Relative %: 76 %
Platelets: 230 10*3/uL (ref 150–400)
RBC: 3.46 MIL/uL — ABNORMAL LOW (ref 3.87–5.11)
RDW: 13.1 % (ref 11.5–15.5)
WBC: 8.1 10*3/uL (ref 4.0–10.5)
nRBC: 0 % (ref 0.0–0.2)

## 2019-07-21 LAB — COMPREHENSIVE METABOLIC PANEL
ALT: 14 U/L (ref 0–44)
AST: 13 U/L — ABNORMAL LOW (ref 15–41)
Albumin: 2.8 g/dL — ABNORMAL LOW (ref 3.5–5.0)
Alkaline Phosphatase: 45 U/L (ref 38–126)
Anion gap: 11 (ref 5–15)
BUN: 34 mg/dL — ABNORMAL HIGH (ref 8–23)
CO2: 23 mmol/L (ref 22–32)
Calcium: 8.7 mg/dL — ABNORMAL LOW (ref 8.9–10.3)
Chloride: 103 mmol/L (ref 98–111)
Creatinine, Ser: 0.69 mg/dL (ref 0.44–1.00)
GFR calc Af Amer: >60 mL/min (ref >60)
GFR calc non Af Amer: >60 mL/min (ref >60)
Glucose, Bld: 316 mg/dL — ABNORMAL HIGH (ref 70–99)
Potassium: 4.3 mmol/L (ref 3.5–5.1)
Sodium: 137 mmol/L (ref 135–145)
Total Bilirubin: 1.1 mg/dL (ref 0.3–1.2)
Total Protein: 6.1 g/dL — ABNORMAL LOW (ref 6.5–8.1)

## 2019-07-21 LAB — MAGNESIUM: Magnesium: 1.9 mg/dL (ref 1.7–2.4)

## 2019-07-21 LAB — GLUCOSE, CAPILLARY
Glucose-Capillary: 287 mg/dL — ABNORMAL HIGH (ref 70–99)
Glucose-Capillary: 325 mg/dL — ABNORMAL HIGH (ref 70–99)
Glucose-Capillary: 370 mg/dL — ABNORMAL HIGH (ref 70–99)
Glucose-Capillary: 170 mg/dL — ABNORMAL HIGH (ref 70–99)

## 2019-07-21 LAB — PHOSPHORUS: Phosphorus: 4.8 mg/dL — ABNORMAL HIGH (ref 2.5–4.6)

## 2019-07-21 MED ORDER — ASPIRIN EC 325 MG PO TBEC: 325.0000 mg | DELAYED_RELEASE_TABLET | Freq: Every day | ORAL | 2 refills | Status: AC

## 2019-07-21 MED ORDER — NYSTATIN 100000 UNIT/GM EX POWD
Freq: Two times a day (BID) | CUTANEOUS | Status: DC
  Filled 2019-07-21 (×2): qty 15

## 2019-07-21 MED ORDER — ACETAMINOPHEN 325 MG PO TABS: 325.0000 mg | ORAL_TABLET | Freq: Four times a day (QID) | ORAL | 0 refills | Status: DC | PRN

## 2019-07-21 MED ORDER — TOLNAFTATE 1 % EX POWD
Freq: Two times a day (BID) | CUTANEOUS | Status: DC
  Filled 2019-07-21: qty 45

## 2019-07-21 MED ORDER — DIGOXIN 0.25 MG/ML IJ SOLN
0.2500 mg | Freq: Every day | INTRAMUSCULAR | Status: AC
  Administered 2019-07-21: 0.25 mg via INTRAVENOUS
  Filled 2019-07-21: qty 1

## 2019-07-21 MED ORDER — IBUPROFEN 600 MG PO TABS: 600.0000 mg | ORAL_TABLET | Freq: Four times a day (QID) | ORAL | 0 refills | Status: DC | PRN

## 2019-07-21 NOTE — NC FL2 (Signed)
[place] MEDICAID FL2 LEVEL OF CARE SCREENING TOOL     IDENTIFICATION  Patient Name: Valerie Carr Birthdate: [birth year] Sex: female Admission Date (Current Location): 07/19/2019  [facility] and [place] Number:  Producer, television/film/video and Address:  [facility] [place], [address] [place], [place] [address]      Provider Number: [id number]  Attending Physician Name and Address:  Drema Dallas, MD  Relative Name and Phone Number:  Ruthine Dose    Current Level of Care: Hospital Recommended Level of Care: Skilled Nursing Facility Prior Approval Number:    Date Approved/Denied:   PASRR Number: [id number]  Discharge Plan: SNF    Current Diagnoses: Patient Active Problem List   Diagnosis Date Noted  . Candidiasis of breast 07/21/2019  . Closed fracture of neck of right femur (HCC) 07/19/2019  . Hypokalemia 07/19/2019  . Heme positive stool   . Hyperglycemia   . COVID-19 virus infection   . Rectal bleeding 10/18/2018  . AAA (abdominal aortic aneurysm) (HCC) 10/18/2018  . Dementia with behavioral disturbance (HCC)   . Type 2 diabetes mellitus (HCC)   . Acute lower UTI 09/04/2016  . Acute cystitis without hematuria   . AKI (acute kidney injury) (HCC) 09/03/2016  . Chronic atrial fibrillation (HCC) 05/31/2014  . Cardiac pacemaker in situ 05/31/2014  . Coronary artery disease involving coronary bypass graft of native heart without angina pectoris 05/31/2014  . Carotid artery stenosis, asymptomatic 05/31/2014  . Sick sinus syndrome (HCC) 04/18/2013  . Coronary atherosclerosis of native coronary artery 01/11/2013  . Essential hypertension, benign 01/11/2013  . Mixed hyperlipidemia 01/11/2013  . Alzheimer's disease (HCC) 09/02/2012  . Bradycardia 01/06/2011    Orientation RESPIRATION BLADDER Height & Weight     Self  Normal Incontinent, External catheter Weight: 126 lb 12.2 oz (57.5 kg) Height:  5\' 8"  (172.7 cm)  BEHAVIORAL SYMPTOMS/MOOD  NEUROLOGICAL BOWEL NUTRITION STATUS      Continent Diet (See discharge summary)  AMBULATORY STATUS COMMUNICATION OF NEEDS Skin   Total Care   Surgical wounds (Closed right hip incision)                       Personal Care Assistance Level of Assistance  Bathing, Feeding, Dressing Bathing Assistance: Maximum assistance Feeding assistance: Maximum assistance Dressing Assistance: Maximum assistance     Functional Limitations Info  Sight, Speech, Hearing Sight Info: Adequate Hearing Info: Adequate Speech Info: Adequate    SPECIAL CARE FACTORS FREQUENCY  PT (By licensed PT), OT (By licensed OT)     PT Frequency: 5x a week OT Frequency: 5x a week            Contractures Contractures Info: Not present    Additional Factors Info  Code Status, Allergies Code Status Info: DNR Allergies Info: Clarithromycin, Codeine Phosphate, Effexor (Venlafaxine Hydrochloride), Fexofenadine Hcl, Lisinopril, Prozac (Fluoxetine Hcl), Septra (Bactrim), Wellbutrin (Bupropion Hcl)           Current Medications (07/21/2019):  This is the current hospital active medication list Current Facility-Administered Medications  Medication Dose Route Frequency Provider Last Rate Last Admin  . acetaminophen (TYLENOL) tablet 325-650 mg  325-650 mg Oral Q6H PRN Haddix, 07/23/2019, MD   650 mg at 07/21/19 1320  . aspirin EC tablet 81 mg  81 mg Oral Daily Haddix, 07/23/19, MD   81 mg at 07/21/19 0939  . ceFAZolin (ANCEF) IVPB 2g/100 mL premix  2 g Intravenous Q8H Haddix, 07/23/19,  MD 200 mL/hr at 07/21/19 0850 2 g at 07/21/19 0850  . Chlorhexidine Gluconate Cloth 2 % PADS 6 each  6 each Topical Daily Haddix, Gillie Manners, MD   6 each at 07/21/19 1000  . dextrose 5 %-0.9 % sodium chloride infusion   Intravenous Continuous Haddix, Gillie Manners, MD 75 mL/hr at 07/20/19 1415 New Bag at 07/20/19 1415  . divalproex (DEPAKOTE SPRINKLE) capsule 250 mg  250 mg Oral QID Haddix, Gillie Manners, MD   250 mg at 07/21/19 1220  . docusate sodium  (COLACE) capsule 100 mg  100 mg Oral BID Haddix, Gillie Manners, MD   100 mg at 07/21/19 0940  . DULoxetine (CYMBALTA) DR capsule 60 mg  60 mg Oral q morning - 10a Haddix, Gillie Manners, MD   60 mg at 07/21/19 0939  . enoxaparin (LOVENOX) injection 40 mg  40 mg Subcutaneous Q24H Haddix, Gillie Manners, MD   40 mg at 07/21/19 0939  . feeding supplement (GLUCERNA SHAKE) (GLUCERNA SHAKE) liquid 237 mL  237 mL Oral TID BM Drema Dallas, MD   237 mL at 07/21/19 1455  . fentaNYL (SUBLIMAZE) injection 25 mcg  25 mcg Intravenous Q1H PRN Haddix, Gillie Manners, MD   25 mcg at 07/20/19 2300  . ibuprofen (ADVIL) tablet 600 mg  600 mg Oral Q6H PRN Haddix, Gillie Manners, MD   600 mg at 07/21/19 1320  . insulin aspart (novoLOG) injection 0-15 Units  0-15 Units Subcutaneous TID WC Haddix, Gillie Manners, MD   11 Units at 07/21/19 1245  . insulin aspart (novoLOG) injection 4 Units  4 Units Subcutaneous Once Haddix, Gillie Manners, MD      . insulin detemir (LEVEMIR) injection 15 Units  15 Units Subcutaneous QHS Haddix, Gillie Manners, MD   15 Units at 07/20/19 2302  . magnesium oxide (MAG-OX) tablet 400 mg  400 mg Oral Daily Haddix, Gillie Manners, MD   400 mg at 07/21/19 0940  . menthol-cetylpyridinium (CEPACOL) lozenge 3 mg  1 lozenge Oral PRN Haddix, Gillie Manners, MD       Or  . phenol (CHLORASEPTIC) mouth spray 1 spray  1 spray Mouth/Throat PRN Haddix, Gillie Manners, MD      . metFORMIN (GLUCOPHAGE) tablet 1,000 mg  1,000 mg Oral BID WC Haddix, Gillie Manners, MD   1,000 mg at 07/21/19 0940  . methocarbamol (ROBAXIN) tablet 500 mg  500 mg Oral Q6H PRN Haddix, Gillie Manners, MD   500 mg at 07/21/19 1320   Or  . methocarbamol (ROBAXIN) 500 mg in dextrose 5 % 50 mL IVPB  500 mg Intravenous Q6H PRN Haddix, Gillie Manners, MD      . metoCLOPramide (REGLAN) tablet 5-10 mg  5-10 mg Oral Q8H PRN Haddix, Gillie Manners, MD       Or  . metoCLOPramide (REGLAN) injection 5-10 mg  5-10 mg Intravenous Q8H PRN Haddix, Gillie Manners, MD      . multivitamin with minerals tablet 1 tablet  1 tablet Oral Daily Drema Dallas,  MD   1 tablet at 07/21/19 0940  . mupirocin ointment (BACTROBAN) 2 % 1 application  1 application Nasal BID Haddix, Gillie Manners, MD   1 application at 07/20/19 2306  . ondansetron (ZOFRAN) tablet 4 mg  4 mg Oral Q6H PRN Haddix, Gillie Manners, MD       Or  . ondansetron (ZOFRAN) injection 4 mg  4 mg Intravenous Q6H PRN Haddix, Gillie Manners, MD      . pantoprazole (PROTONIX) EC  tablet 40 mg  40 mg Oral Daily Haddix, Thomasene Lot, MD   40 mg at 07/21/19 0940  . senna-docusate (Senokot-S) tablet 1 tablet  1 tablet Oral BID Haddix, Thomasene Lot, MD   1 tablet at 07/21/19 0939  . tolnaftate (TINACTIN) 1 % powder   Topical BID Allie Bossier, MD      . traZODone (DESYREL) tablet 50 mg  50 mg Oral QHS Haddix, Thomasene Lot, MD   50 mg at 07/20/19 2301     Discharge Medications: Please see discharge summary for a list of discharge medications.  Relevant Imaging Results:  Relevant Lab Results:   Additional Information SSN [id number]  Neysa Hotter [place], [place]

## 2019-07-21 NOTE — Progress Notes (Signed)
Orthopaedic Trauma Progress Note  S: No orthopaedic issues  O:  Vitals:   07/21/19 0330 07/21/19 0735  BP: 128/64 122/79  Pulse: 98 (!) 103  Resp: 18 18  Temp: 97.8 F (36.6 C) 97.8 F (36.6 C)  SpO2: 99% 96%    Awake and alert RLE dressing clean and dry. Swelling appropriate. Does not follow neuro exam  Imaging: Stable postop imaging  Labs:  Results for orders placed or performed during the hospital encounter of 07/19/19 (from the past 24 hour(s))  Glucose, capillary     Status: None   Collection Time: 07/20/19 12:08 PM  Result Value Ref Range   Glucose-Capillary 85 70 - 99 mg/dL  Comprehensive metabolic panel     Status: Abnormal   Collection Time: 07/20/19  2:21 PM  Result Value Ref Range   Sodium 141 135 - 145 mmol/L   Potassium 3.9 3.5 - 5.1 mmol/L   Chloride 107 98 - 111 mmol/L   CO2 24 22 - 32 mmol/L   Glucose, Bld 111 (H) 70 - 99 mg/dL   BUN 35 (H) 8 - 23 mg/dL   Creatinine, Ser 0.69 0.44 - 1.00 mg/dL   Calcium 8.8 (L) 8.9 - 10.3 mg/dL   Total Protein 6.0 (L) 6.5 - 8.1 g/dL   Albumin 2.9 (L) 3.5 - 5.0 g/dL   AST 11 (L) 15 - 41 U/L   ALT 12 0 - 44 U/L   Alkaline Phosphatase 45 38 - 126 U/L   Total Bilirubin 1.3 (H) 0.3 - 1.2 mg/dL   GFR calc non Af Amer >60 >60 mL/min   GFR calc Af Amer >60 >60 mL/min   Anion gap 10 5 - 15  Magnesium     Status: None   Collection Time: 07/20/19  2:21 PM  Result Value Ref Range   Magnesium 1.9 1.7 - 2.4 mg/dL  CBC with Differential/Platelet     Status: Abnormal   Collection Time: 07/20/19  2:21 PM  Result Value Ref Range   WBC 7.6 4.0 - 10.5 K/uL   RBC 3.75 (L) 3.87 - 5.11 MIL/uL   Hemoglobin 11.4 (L) 12.0 - 15.0 g/dL   HCT 34.2 (L) 36 - 46 %   MCV 91.2 80.0 - 100.0 fL   MCH 30.4 26.0 - 34.0 pg   MCHC 33.3 30.0 - 36.0 g/dL   RDW 13.1 11.5 - 15.5 %   Platelets 243 150 - 400 K/uL   nRBC 0.0 0.0 - 0.2 %   Neutrophils Relative % 61 %   Neutro Abs 4.6 1.7 - 7.7 K/uL   Lymphocytes Relative 22 %   Lymphs Abs 1.7 0.7 -  4.0 K/uL   Monocytes Relative 12 %   Monocytes Absolute 0.9 0 - 1 K/uL   Eosinophils Relative 4 %   Eosinophils Absolute 0.3 0 - 0 K/uL   Basophils Relative 1 %   Basophils Absolute 0.1 0 - 0 K/uL   Immature Granulocytes 0 %   Abs Immature Granulocytes 0.02 0.00 - 0.07 K/uL  Phosphorus     Status: None   Collection Time: 07/20/19  2:21 PM  Result Value Ref Range   Phosphorus 3.8 2.5 - 4.6 mg/dL  Glucose, capillary     Status: Abnormal   Collection Time: 07/20/19  2:46 PM  Result Value Ref Range   Glucose-Capillary 124 (H) 70 - 99 mg/dL   Comment 1 Notify RN    Comment 2 Document in Chart   Glucose, capillary  Status: Abnormal   Collection Time: 07/20/19  6:23 PM  Result Value Ref Range   Glucose-Capillary 185 (H) 70 - 99 mg/dL   Comment 1 Notify RN    Comment 2 Document in Chart   Glucose, capillary     Status: Abnormal   Collection Time: 07/20/19  9:50 PM  Result Value Ref Range   Glucose-Capillary 251 (H) 70 - 99 mg/dL  Comprehensive metabolic panel     Status: Abnormal   Collection Time: 07/21/19  4:11 AM  Result Value Ref Range   Sodium 137 135 - 145 mmol/L   Potassium 4.3 3.5 - 5.1 mmol/L   Chloride 103 98 - 111 mmol/L   CO2 23 22 - 32 mmol/L   Glucose, Bld 316 (H) 70 - 99 mg/dL   BUN 34 (H) 8 - 23 mg/dL   Creatinine, Ser 1.06 0.44 - 1.00 mg/dL   Calcium 8.7 (L) 8.9 - 10.3 mg/dL   Total Protein 6.1 (L) 6.5 - 8.1 g/dL   Albumin 2.8 (L) 3.5 - 5.0 g/dL   AST 13 (L) 15 - 41 U/L   ALT 14 0 - 44 U/L   Alkaline Phosphatase 45 38 - 126 U/L   Total Bilirubin 1.1 0.3 - 1.2 mg/dL   GFR calc non Af Amer >60 >60 mL/min   GFR calc Af Amer >60 >60 mL/min   Anion gap 11 5 - 15  Magnesium     Status: None   Collection Time: 07/21/19  4:11 AM  Result Value Ref Range   Magnesium 1.9 1.7 - 2.4 mg/dL  Phosphorus     Status: Abnormal   Collection Time: 07/21/19  4:11 AM  Result Value Ref Range   Phosphorus 4.8 (H) 2.5 - 4.6 mg/dL  CBC with Differential/Platelet     Status:  Abnormal   Collection Time: 07/21/19  4:11 AM  Result Value Ref Range   WBC 8.1 4.0 - 10.5 K/uL   RBC 3.46 (L) 3.87 - 5.11 MIL/uL   Hemoglobin 10.6 (L) 12.0 - 15.0 g/dL   HCT 26.9 (L) 36 - 46 %   MCV 91.9 80.0 - 100.0 fL   MCH 30.6 26.0 - 34.0 pg   MCHC 33.3 30.0 - 36.0 g/dL   RDW 48.5 46.2 - 70.3 %   Platelets 230 150 - 400 K/uL   nRBC 0.0 0.0 - 0.2 %   Neutrophils Relative % 76 %   Neutro Abs 6.2 1.7 - 7.7 K/uL   Lymphocytes Relative 11 %   Lymphs Abs 0.9 0.7 - 4.0 K/uL   Monocytes Relative 12 %   Monocytes Absolute 1.0 0 - 1 K/uL   Eosinophils Relative 0 %   Eosinophils Absolute 0.0 0 - 0 K/uL   Basophils Relative 0 %   Basophils Absolute 0.0 0 - 0 K/uL   Immature Granulocytes 1 %   Abs Immature Granulocytes 0.04 0.00 - 0.07 K/uL  Glucose, capillary     Status: Abnormal   Collection Time: 07/21/19  8:23 AM  Result Value Ref Range   Glucose-Capillary 287 (H) 70 - 99 mg/dL    Assessment: 86 year old female s/p fall  Injuries: Right intertrochanteric femur fracture s/p cephalomedullary nailing  Weightbearing: WBAT RLE  Insicional and dressing care: Dressings to come down POD2  Orthopedic device(s):None  CV/Blood loss:Hgb 11.4-->10.6, continue to monitor. Hemodynamically stable  Pain management: 1. Robaxin 500 mg PRN muscle relaxer 2. Ibuprofen 600 mg q 6 hrs PRN 3. Tylenol 325-650 mg q  6 hrs PRN  VTE prophylaxis: Lovenox while in hospital, will discharge on Aspirin 325 mg daily  ID: Ancef 2 gm postop  Foley/Lines: None needed  Medical co-morbidities: 1. Advanced dementia-avoid narcotics (ibuprofen and tylenol PRN) 2. Chronic a-fib-per hospitalist 3. Type 2 diabetes-Metformin and SSI per hospitalist  Dispo: PT/OT-->SNF  Follow - up plan: 2-3 weeks for x-rays and wound checks  Roby Lofts, MD [facility] [phone] (office) [email or web address]

## 2019-07-21 NOTE — Progress Notes (Signed)
PROGRESS NOTE    Valerie Carr  KGY:[id number] DOB: March 25, 1929 DOA: 07/19/2019 PCP: Lindaann Pascal     Brief Narrative:  86 y.o. WF PMHx arteriosclerotic, CAD, HTN, Sick Sinus Syndrome, HLD, chronic Anxiety and Depression, dimension, DM type II, cholelithiasis,   who is coming to the emergency department after sustaining an undetermined type of injury at the nursing home falling on her right hip area.  She has a history of dementia and is unable to contribute further to the HPI.  She is able to answer simple questions and answer no to headache, chest pain, abdominal pain.  She is stated that the hip hurts a little, but not too much.  ED Course: Initial vital signs were temperature 98.1 F, pulse 90, respirations 26, blood pressure 140/79 and O2 sat 98% on room air.  CBC showed a White count 7.4, hemoglobin 12.2 g/dL and platelets 026.  PT 13.2 and INR 1.0.  Chemistry shows a potassium of 3.4 mmol/L.  All other electrolytes are within normal range when calcium is corrected to albumin.  Glucose 328, BUN 30 and creatinine 0.75 mg/dL.  LFTs show an albumin of 3.4 g/dL, the rest of the measurements are unremarkable.  The patient was given 25 mcg of fentanyl.  I did Toradol 15 mg IVP x1 and 20 mEq of KCl p.o. x1.  Imaging: Right hip x-ray shows comminuted appearing intertrochanteric fracture of the right femoral neck.  Her chest radiograph does not have any active disease.   Subjective: 6/24 afebrile A/O x1 (does not know where, when, why).  Pleasantly demented.  Patient is extremely afraid of men and exam only possible because daughter was present.     Assessment & Plan: Covid vaccination;   Principal Problem:   Closed fracture of neck of right femur (HCC) Active Problems:   Alzheimer's disease (HCC)   Coronary atherosclerosis of native coronary artery   Essential hypertension, benign   Mixed hyperlipidemia   Chronic atrial fibrillation (HCC)   Type 2 diabetes mellitus (HCC)    Hypokalemia   Candidiasis of breast  Closed fracture of neck of right femur (HCC) -6/23 s/p repair of femur see procedure below -Currently pain controlled -Patient refused to work with PT/OT today, per daughter most likely secondary to her dementia and distrust of people. -6/24 PT recommends SNF.  Speak with LCSW in a.m. to begin procedure of placement back to [facility] SN[facility]  Hypokalemia -Resolved  Coronary atherosclerosis of native coronary artery -ASA 81 mg daily   Essential hypertension, benign -BP controlled without medication  Chronic atrial fibrillation (HCC) -CHA?DS?-VASc Score least 6. -Currently rate controlled without medication  Type 2 diabetes mellitus uncontrolled with complication -A1c pending -Levemir 15 units daily -Moderate SSI -Metformin 1000 mg BID  Mixed hyperlipidemia -Lipid panel pending    GERD -Protonix 40 mg daily  Alzheimer's disease (HCC) Continue Cymbalta 60 mg p.o. every morning. Continue Depakote 250 mg p.o. 4 times daily. Continue trazodone 50 mg p.o. at bedtime. Supportive care.  Breast candidiasis -Tinactin powder BID   Goals of care -6/24 PT/OT consult; evaluate patient for SNF.  Patient resides at [facility] and they have physical therapy per daughter.  NOTE patient extremely afraid of men ensure evaluation team composed of WOMEN   DVT prophylaxis: Lovenox Code Status: DNR Family Communication: 6/24 daughter at bedside for discussion of plan of care Status is: Inpatient    Dispo: The patient is from: [facility]  Anticipated d/c is to: [facility] SN[facility]              Anticipated d/c date is: 6/30              Patient currently unstable      Consultants:  Orthopedic surgery   Procedures/Significant Events:  6/23 CPT 27245-Cephalomedullary nailing of RIGHT intertrochanteric femur fracture   I have personally reviewed and interpreted all radiology studies and my findings are as  above.  VENTILATOR SETTINGS:    Cultures   Antimicrobials:    Devices    LINES / TUBES:      Continuous Infusions: .  ceFAZolin (ANCEF) IV 2 g (07/21/19 0850)  . dextrose 5 % and 0.9% NaCl 75 mL/hr at 07/20/19 1415  . methocarbamol (ROBAXIN) IV       Objective: Vitals:   07/21/19 0125 07/21/19 0330 07/21/19 0735 07/21/19 1335  BP: 116/62 128/64 122/79 127/62  Pulse: (!) 106 98 (!) 103 78  Resp: 18 18 18 16   Temp: 97.6 F (36.4 C) 97.8 F (36.6 C) 97.8 F (36.6 C) 97.8 F (36.6 C)  TempSrc:    Oral  SpO2: 99% 99% 96% 98%  Weight:      Height:        Intake/Output Summary (Last 24 hours) at 07/21/2019 1616 Last data filed at 07/21/2019 1200 Gross per 24 hour  Intake 1073.36 ml  Output 75 ml  Net 998.36 ml   Filed Weights   07/19/19 1610 07/20/19 0200  Weight: 54.4 kg 57.5 kg    Physical Exam:  General: A/O x1 (does not know where, when, why), no acute respiratory distress Eyes: negative scleral hemorrhage, negative anisocoria, negative icterus ENT: Negative Runny nose, negative gingival bleeding, Neck:  Negative scars, masses, torticollis, lymphadenopathy, JVD Lungs: Clear to auscultation bilaterally without wheezes or crackles Cardiovascular: Regular rate and rhythm without murmur gallop or rub normal S1 and S2 Abdomen: negative abdominal pain, nondistended, positive soft, bowel sounds, no rebound, no ascites, no appreciable mass Extremities: No significant cyanosis, clubbing, or edema bilateral lower extremities Skin: Negative rashes, lesions, ulcers Psychiatric: Unable to fully assess secondary to patient's dementia which is severe Central nervous system:  Cranial nerves II through XII intact, tongue/uvula midline, all extremities muscle strength 5/5, sensation intact throughout, negative dysarthria, negative expressive aphasia, negative receptive aphasia.    Data Reviewed: Care during the described time interval was provided by me .  I have  reviewed this patient's available data, including medical history, events of note, physical examination, and all test results as part of my evaluation.  CBC: Recent Labs  Lab 07/19/19 1639 07/20/19 1421 07/21/19 0411  WBC 10.4 7.6 8.1  NEUTROABS 7.8* 4.6 6.2  HGB 12.2 11.4* 10.6*  HCT 36.3 34.2* 31.8*  MCV 90.5 91.2 91.9  PLT 227 243 295   Basic Metabolic Panel: Recent Labs  Lab 07/19/19 1639 07/20/19 1421 07/21/19 0411  NA 136 141 137  K 3.4* 3.9 4.3  CL 100 107 103  CO2 24 24 23   GLUCOSE 328* 111* 316*  BUN 30* 35* 34*  CREATININE 0.75 0.69 0.69  CALCIUM 8.7* 8.8* 8.7*  MG 1.9 1.9 1.9  PHOS 2.9 3.8 4.8*   GFR: Estimated Creatinine Clearance: 45 mL/min (by C-G formula based on SCr of 0.69 mg/dL). Liver Function Tests: Recent Labs  Lab 07/19/19 1639 07/20/19 1421 07/21/19 0411  AST 11* 11* 13*  ALT 12 12 14   ALKPHOS 50 45 45  BILITOT 0.9 1.3* 1.1  PROT 7.0 6.0* 6.1*  ALBUMIN 3.4* 2.9* 2.8*   No results for input(s): LIPASE, AMYLASE in the last 168 hours. No results for input(s): AMMONIA in the last 168 hours. Coagulation Profile: Recent Labs  Lab 07/19/19 1639  INR 1.0   Cardiac Enzymes: No results for input(s): CKTOTAL, CKMB, CKMBINDEX, TROPONINI in the last 168 hours. BNP (last 3 results) No results for input(s): PROBNP in the last 8760 hours. HbA1C: No results for input(s): HGBA1C in the last 72 hours. CBG: Recent Labs  Lab 07/20/19 1446 07/20/19 1823 07/20/19 2150 07/21/19 0823 07/21/19 1137  GLUCAP 124* 185* 251* 287* 325*   Lipid Profile: No results for input(s): CHOL, HDL, LDLCALC, TRIG, CHOLHDL, LDLDIRECT in the last 72 hours. Thyroid Function Tests: No results for input(s): TSH, T4TOTAL, FREET4, T3FREE, THYROIDAB in the last 72 hours. Anemia Panel: No results for input(s): VITAMINB12, FOLATE, FERRITIN, TIBC, IRON, RETICCTPCT in the last 72 hours. Sepsis Labs: No results for input(s): PROCALCITON, LATICACIDVEN in the last 168  hours.  Recent Results (from the past 240 hour(s))  SARS Coronavirus 2 by RT PCR (hospital order, performed in [facility] hospital lab) Nasopharyngeal Nasopharyngeal Swab     Status: None   Collection Time: 07/19/19  5:41 PM   Specimen: Nasopharyngeal Swab  Result Value Ref Range Status   SARS Coronavirus 2 NEGATIVE NEGATIVE Final    Comment: (NOTE) SARS-CoV-2 target nucleic acids are NOT DETECTED.  The SARS-CoV-2 RNA is generally detectable in upper and lower respiratory specimens during the acute phase of infection. The lowest concentration of SARS-CoV-2 viral copies this assay can detect is 250 copies / mL. A negative result does not preclude SARS-CoV-2 infection and should not be used as the sole basis for treatment or other patient management decisions.  A negative result may occur with improper specimen collection / handling, submission of specimen other than nasopharyngeal swab, presence of viral mutation(s) within the areas targeted by this assay, and inadequate number of viral copies (<250 copies / mL). A negative result must be combined with clinical observations, patient history, and epidemiological information.  Fact Sheet for Patients:   BoilerBrush.com.cy  Fact Sheet for Healthcare Providers: https://pope.com/  This test is not yet approved or  cleared by the [place] FDA and has been authorized for detection and/or diagnosis of SARS-CoV-2 by FDA under an Emergency Use Authorization (EUA).  This EUA will remain in effect (meaning this test can be used) for the duration of the COVID-19 declaration under Section 564(b)(1) of the Act, 21 U.S.C. section 360bbb-3(b)(1), unless the authorization is terminated or revoked sooner.  Performed at [facility], [address], [place], [place] [address]   Surgical pcr screen     Status: Abnormal   Collection Time: 07/20/19  2:20 AM   Specimen: Nasal Mucosa; Nasal Swab   Result Value Ref Range Status   MRSA, PCR NEGATIVE NEGATIVE Final   Staphylococcus aureus POSITIVE (A) NEGATIVE Final    Comment: CRITICAL RESULT CALLED TO, READ BACK BY AND VERIFIED WITH: RN Forestine Na [id number] @[id number]  THANEY Performed at [facility], [address], [place], [place]          Radiology Studies: DG Chest Portable 1 View  Result Date: 07/19/2019 CLINICAL DATA:  86 year female with right hip fracture. Preop radiograph. EXAM: PORTABLE CHEST 1 VIEW COMPARISON:  Chest radiograph dated 10/19/2018. FINDINGS: No focal consolidation, pleural effusion, pneumothorax. The cardiac silhouette is within limits. Atherosclerotic calcification of the aorta. Median sternotomy  wires, CABG vascular clips, and left pectoral pacemaker device. No acute osseous pathology. IMPRESSION: No active disease. Electronically Signed   By: Elgie Collard M.D.   On: 07/19/2019 18:29   DG Knee Complete 4 Views Right  Result Date: 07/20/2019 CLINICAL DATA:  Status post fall. Right knee pain. Initial encounter. EXAM: RIGHT KNEE - COMPLETE 4+ VIEW COMPARISON:  None. FINDINGS: There is no acute bony or joint abnormality. Trace amount of joint fluid. Joint spaces preserved. Chondrocalcinosis atherosclerosis noted. IMPRESSION: No acute abnormality. Electronically Signed   By: Drusilla Kanner M.D.   On: 07/20/2019 13:28   DG C-Arm 1-60 Min  Result Date: 07/20/2019 CLINICAL DATA:  IM nail right femur. EXAM: DG C-ARM 1-60 MIN; OPERATIVE RIGHT HIP WITH PELVIS FLUOROSCOPY TIME:  Fluoroscopy Time:  1 minutes 21 seconds Radiation Exposure Index (if provided by the fluoroscopic device): 9.02 mGy Number of Acquired Spot Images: 6 COMPARISON:  Preoperative radiograph yesterday. FINDINGS: Six fluoroscopic spot views obtained in the operating room. Intramedullary nail with trans trochanteric and distal locking screws fixate intertrochanteric femur fracture. Fluoroscopy time 1 minutes 21 seconds,  dose 9.02 mGy. IMPRESSION: Procedural fluoroscopy during IM nailing of right proximal femur fracture. Electronically Signed   By: Narda Rutherford M.D.   On: 07/20/2019 19:33   DG HIP PORT UNILAT WITH PELVIS 1V RIGHT  Result Date: 07/20/2019 CLINICAL DATA:  Postop IM nail. EXAM: DG HIP (WITH OR WITHOUT PELVIS) 1V PORT RIGHT COMPARISON:  Preoperative radiograph yesterday. FINDINGS: Intramedullary nail with trans trochanteric and distal locking screw traverse intertrochanteric femur fracture. The fracture is in improved alignment compared to preoperative imaging. No periprosthetic lucency or new fracture. Recent postsurgical change includes air and edema in the soft tissues. IMPRESSION: ORIF right intertrochanteric femur fracture. No immediate postoperative complication. Electronically Signed   By: Narda Rutherford M.D.   On: 07/20/2019 19:34   DG HIP OPERATIVE UNILAT W OR W/O PELVIS RIGHT  Result Date: 07/20/2019 CLINICAL DATA:  IM nail right femur. EXAM: DG C-ARM 1-60 MIN; OPERATIVE RIGHT HIP WITH PELVIS FLUOROSCOPY TIME:  Fluoroscopy Time:  1 minutes 21 seconds Radiation Exposure Index (if provided by the fluoroscopic device): 9.02 mGy Number of Acquired Spot Images: 6 COMPARISON:  Preoperative radiograph yesterday. FINDINGS: Six fluoroscopic spot views obtained in the operating room. Intramedullary nail with trans trochanteric and distal locking screws fixate intertrochanteric femur fracture. Fluoroscopy time 1 minutes 21 seconds, dose 9.02 mGy. IMPRESSION: Procedural fluoroscopy during IM nailing of right proximal femur fracture. Electronically Signed   By: Narda Rutherford M.D.   On: 07/20/2019 19:33   DG Hip Unilat W or Wo Pelvis 2-3 Views Right  Result Date: 07/19/2019 CLINICAL DATA:  86 year old female with right hip pain. EXAM: DG HIP (WITH OR WITHOUT PELVIS) 2-3V RIGHT COMPARISON:  CT abdomen pelvis dated 10/18/2018. FINDINGS: There is a comminuted appearing intertrochanteric fracture of the  right femoral neck. There is mild proximal migration of the femoral shaft with mild there is angulation. No dislocation. The bones are osteopenic. The soft tissues are unremarkable. Vascular calcifications noted. IMPRESSION: Comminuted appearing intertrochanteric fracture of the right femoral neck. Electronically Signed   By: Elgie Collard M.D.   On: 07/19/2019 18:28        Scheduled Meds: . aspirin EC  81 mg Oral Daily  . Chlorhexidine Gluconate Cloth  6 each Topical Daily  . divalproex  250 mg Oral QID  . docusate sodium  100 mg Oral BID  . DULoxetine  60 mg Oral q morning -  10a  . enoxaparin (LOVENOX) injection  40 mg Subcutaneous Q24H  . feeding supplement (GLUCERNA SHAKE)  237 mL Oral TID BM  . insulin aspart  0-15 Units Subcutaneous TID WC  . insulin aspart  4 Units Subcutaneous Once  . insulin detemir  15 Units Subcutaneous QHS  . magnesium oxide  400 mg Oral Daily  . metFORMIN  1,000 mg Oral BID WC  . multivitamin with minerals  1 tablet Oral Daily  . mupirocin ointment  1 application Nasal BID  . pantoprazole  40 mg Oral Daily  . senna-docusate  1 tablet Oral BID  . tolnaftate   Topical BID  . traZODone  50 mg Oral QHS   Continuous Infusions: .  ceFAZolin (ANCEF) IV 2 g (07/21/19 0850)  . dextrose 5 % and 0.9% NaCl 75 mL/hr at 07/20/19 1415  . methocarbamol (ROBAXIN) IV       LOS: 2 days    Time spent:40 min    Keatyn Jawad, Roselind MessierURTIS J, MD [facility] Pager [phone]  If 7PM-7AM, please contact night-coverage [email or web address] Password [email or web address] 07/21/2019, 4:16 PM

## 2019-07-21 NOTE — Evaluation (Signed)
Physical Therapy Evaluation Patient Details Name: Valerie Carr MRN: [id number] DOB: [birth year] Today's Date: 07/21/2019   History of Present Illness  Pt is a 86 y.o. F with significant PMH of dementia, DM2, HTN, CABG who presents from SNF with complaint of right hip pain and bruising. Imaging found right intertrochanteric femur fracture s/p cephalomedullary nailing.  Clinical Impression  Pt admitted from SNF s/p procedure listed above. Per chart review, pt ambulatory prior to admission. PT evaluation limited due to agitation, decreased cognition and R hip pain. Assisted pt into partial sitting position, but returned to supine due to sharp increase in pain with movement and pt screaming, "you are trying to kill me." Focused on gentle PROM of R knee and ankle. Pt will be difficult to mobilize due to above deficits. Will continue to progress as tolerated.     Follow Up Recommendations SNF    Equipment Recommendations  Other (comment) (defer)    Recommendations for Other Services       Precautions / Restrictions Precautions Precautions: Fall Restrictions Weight Bearing Restrictions: Yes RLE Weight Bearing: Weight bearing as tolerated      Mobility  Bed Mobility Overal bed mobility: Needs Assistance Bed Mobility: Supine to Sit;Sit to Supine     Supine to sit: Total assist;+2 for physical assistance Sit to supine: Total assist;+2 for physical assistance   General bed mobility comments: TotalA + 2 to bring into partial sitting position, pt with sharp increase in pain and yelling at therapists "you are trying to kill me." Returned to supine and repositioned  Transfers                 General transfer comment: deferred  Ambulation/Gait                Stairs            Wheelchair Mobility    Modified Rankin (Stroke Patients Only)       Balance                                             Pertinent Vitals/Pain Pain Assessment:  Faces Faces Pain Scale: Hurts worst Pain Location: R hip with movement Pain Descriptors / Indicators: Guarding;Grimacing;Sharp Pain Intervention(s): Limited activity within patient's tolerance;Monitored during session;Repositioned    Home Living Family/patient expects to be discharged to:: Skilled nursing facility                      Prior Function Level of Independence: Needs assistance   Gait / Transfers Assistance Needed: Ambulatory per chart review  ADL's / Homemaking Assistance Needed: Likely requiring assist with ADL's        Hand Dominance        Extremity/Trunk Assessment   Upper Extremity Assessment Upper Extremity Assessment: Generalized weakness    Lower Extremity Assessment Lower Extremity Assessment: RLE deficits/detail RLE Deficits / Details: Femur fx s/p nailing. PROM ankle dorsiflexion WFL. Hip/knee ROM limited by pain       Communication   Communication: No difficulties  Cognition Arousal/Alertness: Awake/alert Behavior During Therapy: Agitated Overall Cognitive Status: History of cognitive impairments - at baseline                                 General Comments: History of advanced dementia, agitated, unable  to follow one step commands.      General Comments      Exercises General Exercises - Lower Extremity Ankle Circles/Pumps: PROM;Right;10 reps;Supine Heel Slides: PROM;Right;5 reps;Supine   Assessment/Plan    PT Assessment Patient needs continued PT services  PT Problem List Decreased strength;Decreased activity tolerance;Decreased balance;Decreased mobility;Pain;Decreased safety awareness;Decreased cognition;Decreased range of motion       PT Treatment Interventions DME instruction;Gait training;Functional mobility training;Therapeutic activities;Therapeutic exercise;Balance training;Patient/family education;Wheelchair mobility training    PT Goals (Current goals can be found in the Care Plan section)  Acute  Rehab PT Goals Patient Stated Goal: unable PT Goal Formulation: Patient unable to participate in goal setting Time For Goal Achievement: 08/04/19 Potential to Achieve Goals: Fair    Frequency Min 3X/week   Barriers to discharge        Co-evaluation               AM-PAC PT "6 Clicks" Mobility  Outcome Measure Help needed turning from your back to your side while in a flat bed without using bedrails?: Total Help needed moving from lying on your back to sitting on the side of a flat bed without using bedrails?: Total Help needed moving to and from a bed to a chair (including a wheelchair)?: Total Help needed standing up from a chair using your arms (e.g., wheelchair or bedside chair)?: Total Help needed to walk in hospital room?: Total Help needed climbing 3-5 steps with a railing? : Total 6 Click Score: 6    End of Session   Activity Tolerance: Patient limited by pain;Treatment limited secondary to agitation Patient left: in bed;with call bell/phone within reach;with bed alarm set Nurse Communication: Mobility status PT Visit Diagnosis: Pain;Other abnormalities of gait and mobility (R26.89) Pain - Right/Left: Right Pain - part of body: Hip    Time: 1203-1228 PT Time Calculation (min) (ACUTE ONLY): 25 min   Charges:   PT Evaluation $PT Eval Moderate Complexity: 1 Mod PT Treatments $Therapeutic Activity: 8-22 mins          Lillia Pauls, PT, DPT Acute Rehabilitation Services Pager [phone] Office [phone]   Norval Morton 07/21/2019, 1:46 PM

## 2019-07-21 NOTE — Progress Notes (Signed)
   07/20/19 2330  Assess: MEWS Score  Temp (!) 97.4 F (36.3 C)  BP 120/68  Pulse Rate (!) 124  Resp 18  Level of Consciousness Alert  SpO2 97 %  O2 Device Nasal Cannula  O2 Flow Rate (L/min) 2 L/min  Assess: MEWS Score  MEWS Temp 0  MEWS Systolic 0  MEWS Pulse 2  MEWS RR 0  MEWS LOC 0  MEWS Score 2  MEWS Score Color Yellow  Assess: if the MEWS score is Yellow or Red  Were vital signs taken at a resting state? Yes  Focused Assessment Documented focused assessment  Early Detection of Sepsis Score *See Row Information* Low  MEWS guidelines implemented *See Row Information* No, previously yellow, continue vital signs every 4 hours  Treat  MEWS Interventions Administered scheduled meds/treatments;Administered prn meds/treatments  Take Vital Signs  Increase Vital Sign Frequency  Yellow: Q 2hr X 2 then Q 4hr X 2, if remains yellow, continue Q 4hrs  Escalate  MEWS: Escalate Yellow: discuss with charge nurse/RN and consider discussing with provider and RRT  Notify: Charge Nurse/RN  Name of Charge Nurse/RN Notified Alona Bene RN  Date Charge Nurse/RN Notified 07/19/19  Time Charge Nurse/RN Notified 2135  Notify: Provider  Provider Name/Title Cherylin Mylar  Date Provider Notified 07/20/19  Time Provider Notified 2349  Notification Type Page  Notification Reason Other (Comment) (sustain HR in the 120s since return from surgery)  Date of Provider Response 07/20/19  Time of Provider Response 2350  Notify: Rapid Response  Name of Rapid Response RN Notified N/A    Patient returned from surgery with a sustained HR in the 120s. Night time meds and pain medicine administered. [facility] notified and awaiting orders. Patient alert and cooperative, bed in the lowest setting, call bell within reach and bed alarm on. Will continue to monitor.

## 2019-07-21 NOTE — Progress Notes (Signed)
Inpatient Diabetes Program Recommendations  AACE/ADA: New Consensus Statement on Inpatient Glycemic Control (2015)  Target Ranges:  Prepandial:   less than 140 mg/dL      Peak postprandial:   less than 180 mg/dL (1-2 hours)      Critically ill patients:  140 - 180 mg/dL   Lab Results  Component Value Date   GLUCAP 370 (H) 07/21/2019   HGBA1C 7.6 (H) 10/19/2018    Review of Glycemic Control  Diabetes history: DM2 Outpatient Diabetes medications: Levemir 15 units QHS, Novolog 4 units tidwc, metformin 1000 mg bid Current orders for Inpatient glycemic control: Levemir 15 units QHS, Novolog 0-15 units tidwc +4 units tidwc, metformin 1000 mg bid  HgbA1C 7.6% on 10/19/18 - needs updating.  Inpatient Diabetes Program Recommendations:    Increase Levemir to 18 units QHS Increase Novolog to 5 units tidwc for meal coverage insulin Need updated HgbA1C to assess glycemic control PTA.  Continue to follow.  Thank you. Ailene Ards, RD, LDN, CDE Inpatient Diabetes Coordinator [phone]

## 2019-07-21 NOTE — Progress Notes (Signed)
Digoxin 0.25mg  given to Patient per new MD order. Pt's HR sustained in the low 100s. Will continue to monitor.

## 2019-07-21 NOTE — Plan of Care (Signed)
  Problem: Education: Goal: Knowledge of General Education information will improve Description: Including pain rating scale, medication(s)/side effects and non-pharmacologic comfort measures Outcome: Progressing   Problem: Pain Managment: Goal: General experience of comfort will improve Outcome: Progressing   Problem: Safety: Goal: Ability to remain free from injury will improve Outcome: Progressing   

## 2019-07-22 LAB — CBC WITH DIFFERENTIAL/PLATELET
Abs Immature Granulocytes: 0.03 10*3/uL (ref 0.00–0.07)
Basophils Absolute: 0 10*3/uL (ref 0.0–0.1)
Basophils Relative: 1 %
Eosinophils Absolute: 0.2 10*3/uL (ref 0.0–0.5)
Eosinophils Relative: 3 %
HCT: 31.2 % — ABNORMAL LOW (ref 36.0–46.0)
Hemoglobin: 10.4 g/dL — ABNORMAL LOW (ref 12.0–15.0)
Immature Granulocytes: 0 %
Lymphocytes Relative: 17 %
Lymphs Abs: 1.4 10*3/uL (ref 0.7–4.0)
MCH: 30.7 pg (ref 26.0–34.0)
MCHC: 33.3 g/dL (ref 30.0–36.0)
MCV: 92 fL (ref 80.0–100.0)
Monocytes Absolute: 1.1 10*3/uL — ABNORMAL HIGH (ref 0.1–1.0)
Monocytes Relative: 14 %
Neutro Abs: 5.3 10*3/uL (ref 1.7–7.7)
Neutrophils Relative %: 65 %
Platelets: 200 10*3/uL (ref 150–400)
RBC: 3.39 MIL/uL — ABNORMAL LOW (ref 3.87–5.11)
RDW: 13 % (ref 11.5–15.5)
WBC: 8.1 10*3/uL (ref 4.0–10.5)
nRBC: 0 % (ref 0.0–0.2)

## 2019-07-22 LAB — COMPREHENSIVE METABOLIC PANEL
ALT: 9 U/L (ref 0–44)
AST: 13 U/L — ABNORMAL LOW (ref 15–41)
Albumin: 2.5 g/dL — ABNORMAL LOW (ref 3.5–5.0)
Alkaline Phosphatase: 43 U/L (ref 38–126)
Anion gap: 10 (ref 5–15)
BUN: 29 mg/dL — ABNORMAL HIGH (ref 8–23)
CO2: 26 mmol/L (ref 22–32)
Calcium: 8.5 mg/dL — ABNORMAL LOW (ref 8.9–10.3)
Chloride: 102 mmol/L (ref 98–111)
Creatinine, Ser: 0.71 mg/dL (ref 0.44–1.00)
GFR calc Af Amer: >60 mL/min (ref >60)
GFR calc non Af Amer: >60 mL/min (ref >60)
Glucose, Bld: 195 mg/dL — ABNORMAL HIGH (ref 70–99)
Potassium: 4 mmol/L (ref 3.5–5.1)
Sodium: 138 mmol/L (ref 135–145)
Total Bilirubin: 0.8 mg/dL (ref 0.3–1.2)
Total Protein: 5.3 g/dL — ABNORMAL LOW (ref 6.5–8.1)

## 2019-07-22 LAB — LIPID PANEL
Cholesterol: 129 mg/dL (ref 0–200)
HDL: 26 mg/dL — ABNORMAL LOW (ref >40)
LDL Cholesterol: 85 mg/dL (ref 0–99)
Total CHOL/HDL Ratio: 5 RATIO
Triglycerides: 92 mg/dL (ref <150)
VLDL: 18 mg/dL (ref 0–40)

## 2019-07-22 LAB — TYPE AND SCREEN
ABO/RH(D): A POS
Antibody Screen: NEGATIVE

## 2019-07-22 LAB — GLUCOSE, CAPILLARY
Glucose-Capillary: 220 mg/dL — ABNORMAL HIGH (ref 70–99)
Glucose-Capillary: 124 mg/dL — ABNORMAL HIGH (ref 70–99)
Glucose-Capillary: 135 mg/dL — ABNORMAL HIGH (ref 70–99)

## 2019-07-22 LAB — HEMOGLOBIN A1C
Hgb A1c MFr Bld: 8.4 % — ABNORMAL HIGH (ref 4.8–5.6)
Mean Plasma Glucose: 194.38 mg/dL

## 2019-07-22 LAB — PHOSPHORUS: Phosphorus: 2.7 mg/dL (ref 2.5–4.6)

## 2019-07-22 LAB — MAGNESIUM: Magnesium: 1.8 mg/dL (ref 1.7–2.4)

## 2019-07-22 MED ORDER — ATORVASTATIN CALCIUM 10 MG PO TABS
20.0000 mg | ORAL_TABLET | Freq: Every day | ORAL | Status: DC
  Administered 2019-07-22 – 2019-07-24 (×3): 20 mg via ORAL
  Filled 2019-07-22 (×3): qty 2

## 2019-07-22 MED ORDER — INSULIN ASPART 100 UNIT/ML ~~LOC~~ SOLN
8.0000 [IU] | Freq: Three times a day (TID) | SUBCUTANEOUS | Status: DC
  Administered 2019-07-22 (×2): 8 [IU] via SUBCUTANEOUS
  Administered 2019-07-23 (×2): 4 [IU] via SUBCUTANEOUS
  Administered 2019-07-24 (×2): 8 [IU] via SUBCUTANEOUS

## 2019-07-22 NOTE — Evaluation (Signed)
Occupational Therapy Evaluation Patient Details Name: Valerie Carr MRN: [id number] DOB: 03-04-1929 Today's Date: 07/22/2019    History of Present Illness Pt is a 86 y.o. F with significant PMH of dementia, DM2, HTN, CABG who presents from SNF with complaint of right hip pain and bruising. Imaging found right intertrochanteric femur fracture s/p cephalomedullary nailing.   Clinical Impression   Per chart review, patient was residing at a SNF and was ambulating. Patient likely required assist with BADLs at baseline. Patient currently presenting below baseline level of function requiring Max A +2 to Total A +2 for all BADLs, functional transfers and mobility. Evaluation limited 2/2 patient with pain to R hip/leg and increased agitation with therapy efforts. Patient would benefit from acute OT services to increase independence with BADLs. Patient would also benefit from return to SNF with rehab to maximize independence and decrease caregiver burden in prep for return to PLOF.    Follow Up Recommendations  SNF    Equipment Recommendations  Other (comment) (Defer to next level of care)    Recommendations for Other Services       Precautions / Restrictions Precautions Precautions: Fall;Other (comment) (Behaviors) Restrictions Weight Bearing Restrictions: Yes RLE Weight Bearing: Weight bearing as tolerated Other Position/Activity Restrictions:  (RLE)      Mobility Bed Mobility Overal bed mobility: Needs Assistance Bed Mobility: Supine to Sit;Sit to Supine     Supine to sit: Total assist;+2 for physical assistance Sit to supine: Total assist;+2 for physical assistance   General bed mobility comments: Pt refused all bed level and OOB activity this date. Pt very agitated and combative threating to hit this OT.   Transfers                 General transfer comment: deferred (Unable to assess. Pt refused. )    Balance Overall balance assessment:  (Unable to assess)                                          ADL either performed or assessed with clinical judgement   ADL Overall ADL's : Needs assistance/impaired     Grooming: Maximal assistance   Upper Body Bathing: Maximal assistance   Lower Body Bathing: +2 for physical assistance;Total assistance   Upper Body Dressing : Maximal assistance   Lower Body Dressing: +2 for physical assistance;Total assistance   Toilet Transfer: +2 for physical assistance;Total assistance           Functional mobility during ADLs: Total assistance;+2 for physical assistance General ADL Comments: All ADLs based on clinical judgement and functional assessment.      Vision Patient Visual Report: Other (comment) (Unable assess 2/2 pt inability to follow 1-step vc's )       Perception     Praxis      Pertinent Vitals/Pain Pain Assessment: Faces Faces Pain Scale: Hurts worst Pain Location: R hip with movement Pain Descriptors / Indicators: Guarding;Grimacing;Sharp;Crying Pain Intervention(s): Limited activity within patient's tolerance     Hand Dominance     Extremity/Trunk Assessment Upper Extremity Assessment Upper Extremity Assessment: Generalized weakness   Lower Extremity Assessment Lower Extremity Assessment: Generalized weakness RLE Deficits / Details: Femur fx s/p nailing. PROM ankle dorsiflexion WFL. Hip/knee ROM limited by pain   Cervical / Trunk Assessment Cervical / Trunk Assessment: Kyphotic   Communication Communication Communication: No difficulties   Cognition Arousal/Alertness: Awake/alert Behavior During  Therapy: Agitated Overall Cognitive Status: History of cognitive impairments - at baseline                                 General Comments: History of advanced dementia, agitated, unable to follow one step commands consistently.   General Comments  Family present at bedside.     Exercises     Shoulder Instructions      Home Living  Family/patient expects to be discharged to:: Skilled nursing facility                                        Prior Functioning/Environment Level of Independence: Needs assistance  Gait / Transfers Assistance Needed: Ambulatory per chart review ADL's / Homemaking Assistance Needed: Likely requiring assist with ADL's            OT Problem List: Decreased strength;Decreased range of motion;Decreased activity tolerance;Impaired balance (sitting and/or standing);Decreased cognition;Decreased safety awareness;Decreased knowledge of use of DME or AE;Decreased knowledge of precautions;Pain      OT Treatment/Interventions: Self-care/ADL training;Therapeutic exercise;Energy conservation;DME and/or AE instruction;Therapeutic activities;Patient/family education;Balance training    OT Goals(Current goals can be found in the care plan section) Acute Rehab OT Goals Patient Stated Goal: unable ADL Goals Pt Will Perform Grooming: with min assist;sitting Pt Will Perform Upper Body Dressing: with min assist;sitting Pt Will Perform Lower Body Dressing: with mod assist;sit to/from stand Pt Will Transfer to Toilet: with mod assist;bedside commode;ambulating Pt Will Perform Toileting - Clothing Manipulation and hygiene: with mod assist;sit to/from stand Pt/caregiver will Perform Home Exercise Program: Increased strength;Both right and left upper extremity;With minimal assist  OT Frequency: Min 2X/week   Barriers to D/C:            Co-evaluation              AM-PAC OT "6 Clicks" Daily Activity     Outcome Measure Help from another person eating meals?: A Lot Help from another person taking care of personal grooming?: A Lot Help from another person toileting, which includes using toliet, bedpan, or urinal?: Total Help from another person bathing (including washing, rinsing, drying)?: Total Help from another person to put on and taking off regular upper body clothing?: A  Lot Help from another person to put on and taking off regular lower body clothing?: Total 6 Click Score: 9   End of Session    Activity Tolerance: Treatment limited secondary to agitation Patient left: in bed;with call bell/phone within reach;with bed alarm set  OT Visit Diagnosis: Unsteadiness on feet (R26.81);Muscle weakness (generalized) (M62.81);Pain Pain - Right/Left: Right Pain - part of body: Leg;Hip                Time: 1102-1120 OT Time Calculation (min): 18 min Charges:  OT General Charges $OT Visit: 1 Visit OT Evaluation $OT Eval Moderate Complexity: 1 Mod  Malita Ignasiak H. OTR/L Supplemental OT, Department of rehab services [phone]  Francella Barnett R H. 07/22/2019, 12:56 PM

## 2019-07-22 NOTE — Plan of Care (Signed)

## 2019-07-22 NOTE — Progress Notes (Signed)
[facility]thopaedic [facility] Progress Note  S: Sleeping  O:  Vitals:   07/21/19 2022 07/22/19 0405  BP: 116/61 118/63  Pulse: 93 96  Resp: 16 16  Temp: 97.7 F (36.5 C) 98 F (36.7 C)  SpO2: 96% 97%    Sleeping RLE dressing clean and dry. Swelling appropriate. Does not follow neuro exam  Imaging: Stable postop imaging  Labs:  Results for orders placed or performed during the hospital encounter of 07/19/19 (from the past 24 hour(s))  Glucose, capillary     Status: Abnormal   Collection Time: 07/21/19  8:23 AM  Result Value Ref Range   Glucose-Capillary 287 (H) 70 - 99 mg/dL  Glucose, capillary     Status: Abnormal   Collection Time: 07/21/19 11:37 AM  Result Value Ref Range   Glucose-Capillary 325 (H) 70 - 99 mg/dL  Glucose, capillary     Status: Abnormal   Collection Time: 07/21/19  4:53 PM  Result Value Ref Range   Glucose-Capillary 370 (H) 70 - 99 mg/dL  Glucose, capillary     Status: Abnormal   Collection Time: 07/21/19  9:48 PM  Result Value Ref Range   Glucose-Capillary 170 (H) 70 - 99 mg/dL  Comprehensive metabolic panel     Status: Abnormal   Collection Time: 07/22/19  3:28 AM  Result Value Ref Range   Sodium 138 135 - 145 mmol/L   Potassium 4.0 3.5 - 5.1 mmol/L   Chloride 102 98 - 111 mmol/L   CO2 26 22 - 32 mmol/L   Glucose, Bld 195 (H) 70 - 99 mg/dL   BUN 29 (H) 8 - 23 mg/dL   Creatinine, Ser 0.53 0.44 - 1.00 mg/dL   Calcium 8.5 (L) 8.9 - 10.3 mg/dL   Total Protein 5.3 (L) 6.5 - 8.1 g/dL   Albumin 2.5 (L) 3.5 - 5.0 g/dL   AST 13 (L) 15 - 41 U/L   ALT 9 0 - 44 U/L   Alkaline Phosphatase 43 38 - 126 U/L   Total Bilirubin 0.8 0.3 - 1.2 mg/dL   GFR calc non Af Amer >60 >60 mL/min   GFR calc Af Amer >60 >60 mL/min   Anion gap 10 5 - 15  Magnesium     Status: None   Collection Time: 07/22/19  3:28 AM  Result Value Ref Range   Magnesium 1.8 1.7 - 2.4 mg/dL  Phosphorus     Status: None   Collection Time: 07/22/19  3:28 AM  Result Value Ref Range   Phosphorus  2.7 2.5 - 4.6 mg/dL  CBC with Differential/Platelet     Status: Abnormal   Collection Time: 07/22/19  3:28 AM  Result Value Ref Range   WBC 8.1 4.0 - 10.5 K/uL   RBC 3.39 (L) 3.87 - 5.11 MIL/uL   Hemoglobin 10.4 (L) 12.0 - 15.0 g/dL   HCT 97.6 (L) 36 - 46 %   MCV 92.0 80.0 - 100.0 fL   MCH 30.7 26.0 - 34.0 pg   MCHC 33.3 30.0 - 36.0 g/dL   RDW 73.4 19.3 - 79.0 %   Platelets 200 150 - 400 K/uL   nRBC 0.0 0.0 - 0.2 %   Neutrophils Relative % 65 %   Neutro Abs 5.3 1.7 - 7.7 K/uL   Lymphocytes Relative 17 %   Lymphs Abs 1.4 0.7 - 4.0 K/uL   Monocytes Relative 14 %   Monocytes Absolute 1.1 (H) 0 - 1 K/uL   Eosinophils Relative 3 %  Eosinophils Absolute 0.2 0 - 0 K/uL   Basophils Relative 1 %   Basophils Absolute 0.0 0 - 0 K/uL   Immature Granulocytes 0 %   Abs Immature Granulocytes 0.03 0.00 - 0.07 K/uL  Hemoglobin A1c     Status: Abnormal   Collection Time: 07/22/19  3:28 AM  Result Value Ref Range   Hgb A1c MFr Bld 8.4 (H) 4.8 - 5.6 %   Mean Plasma Glucose 194.38 mg/dL  Type and screen [place]     Status: None   Collection Time: 07/22/19  3:28 AM  Result Value Ref Range   ABO/RH(D) A POS    Antibody Screen NEG    Sample Expiration      07/25/2019,2359 Performed at [facility], [place] [address], [place], [place] [address]   Lipid panel     Status: Abnormal   Collection Time: 07/22/19  3:28 AM  Result Value Ref Range   Cholesterol 129 0 - 200 mg/dL   Triglycerides 92 <150 mg/dL   HDL 26 (L) >40 mg/dL   Total CHOL/HDL Ratio 5.0 RATIO   VLDL 18 0 - 40 mg/dL   LDL Cholesterol 85 0 - 99 mg/dL    Assessment: 86 year old female s/p fall  Injuries: Right intertrochanteric femur fracture s/p cephalomedullary nailing  Weightbearing: WBAT RLE  Insicional and dressing care: Dressings to come down POD2  Orthopedic device(s):None  CV/Blood loss:Hgb 11.4-->10.6-->10.4, continue to monitor. Hemodynamically stable  Pain management: 1. Robaxin 500 mg  PRN muscle relaxer 2. Ibuprofen 600 mg q 6 hrs PRN 3. Tylenol 325-650 mg q 6 hrs PRN  VTE prophylaxis: Lovenox while in hospital, will discharge on Aspirin 325 mg daily-script provided on chart  ID: Ancef 2 gm postop  Foley/Lines: None needed  Medical co-morbidities: 1. Advanced dementia-avoid narcotics (ibuprofen and tylenol PRN) 2. Chronic a-fib-per hospitalist 3. Type 2 diabetes-Metformin and SSI per hospitalist  Dispo: PT/OT-->SNF, okay to discharge from my standpoint to SNF, ibuprofen and tylenol scripts provided on chart. No narcotics due to advanced dementia  Follow - up plan: 2-3 weeks for x-rays and wound checks  Shona Needles, MD [facility] [phone] (office) [email or web address]

## 2019-07-22 NOTE — Care Management Important Message (Signed)
Important Message  Patient Details  Name: Valerie Carr MRN: [id number] Date of Birth: 05/20/1929   Medicare Important Message Given:  Yes     Dorena Bodo 07/22/2019, 1:07 PM

## 2019-07-22 NOTE — TOC Initial Note (Signed)
Transition of Care [facility]) - Initial/Assessment Note    Patient Details  Name: Valerie Carr MRN: [id number] Date of Birth: 20-Nov-1929  Transition of Care [facility]) CM/SW Contact:    Epifanio Lesches, RN Phone Number: 07/22/2019, 4:21 PM  Clinical Narrative:         Pt from [facility] SNF/LTC. Admitted with c/o hip pain... pt with noted R femur fx. Pt s/p  cephalomedullary nailing 6/23. NCM spoke with daughter [name] @ bedside in regard to d/c planning. [name] stated plan is for mom to return to [facility] when d/c. NCM made [facility] [facility] admission liaison aware. Liaison stated they were aware. States DON has spoken with [name] qd since pt admitted to hospital.  FL-2 completed. Please call Melissa   if d/c over weekend, [phone].  TOC team will continue to follow and monitor....   Expected Discharge Plan: Skilled Nursing Facility [facility]) Barriers to Discharge: Continued Medical Work up   Patient Goals and CMS Choice        Expected Discharge Plan and Services Expected Discharge Plan: Skilled Nursing Facility [facility])                                              Prior Living Arrangements/Services                       Activities of Daily Living      Permission Sought/Granted                  Emotional Assessment              Admission diagnosis:  Closed fracture of neck of right femur (HCC) [S72.001A] Closed fracture of right hip, initial encounter [facility]) [S72.001A] Patient Active Problem List   Diagnosis Date Noted  . Candidiasis of breast 07/21/2019  . Closed fracture of neck of right femur (HCC) 07/19/2019  . Hypokalemia 07/19/2019  . Heme positive stool   . Hyperglycemia   . COVID-19 virus infection   . Rectal bleeding 10/18/2018  . AAA (abdominal aortic aneurysm) (HCC) 10/18/2018  . Dementia with behavioral disturbance (HCC)   . Type 2 diabetes mellitus (HCC)   . Acute lower UTI 09/04/2016  . Acute  cystitis without hematuria   . AKI (acute kidney injury) (HCC) 09/03/2016  . Chronic atrial fibrillation (HCC) 05/31/2014  . Cardiac pacemaker in situ 05/31/2014  . Coronary artery disease involving coronary bypass graft of native heart without angina pectoris 05/31/2014  . Carotid artery stenosis, asymptomatic 05/31/2014  . Sick sinus syndrome (HCC) 04/18/2013  . Coronary atherosclerosis of native coronary artery 01/11/2013  . Essential hypertension, benign 01/11/2013  . Mixed hyperlipidemia 01/11/2013  . Alzheimer's disease (HCC) 09/02/2012  . Bradycardia 01/06/2011   PCP:  Lindaann Pascal Pharmacy:  No Pharmacies Listed    Social Determinants of Health (SDOH) Interventions    Readmission Risk Interventions No flowsheet data found.

## 2019-07-22 NOTE — Progress Notes (Signed)
PROGRESS NOTE    Valerie Carr  UJW:[id number] DOB: [birth year] DOA: 07/19/2019 PCP: Nanine Means     Brief Narrative:  86 y.o. WF PMHx arteriosclerotic, CAD, HTN, Sick Sinus Syndrome, HLD, chronic Anxiety and Depression, dimension, DM type II, cholelithiasis,   who is coming to the emergency department after sustaining an undetermined type of injury at the nursing home falling on her right hip area.  She has a history of dementia and is unable to contribute further to the HPI.  She is able to answer simple questions and answer no to headache, chest pain, abdominal pain.  She is stated that the hip hurts a little, but not too much.  ED Course: Initial vital signs were temperature 98.1 F, pulse 90, respirations 26, blood pressure 140/79 and O2 sat 98% on room air.  CBC showed a White count 7.4, hemoglobin 12.2 g/dL and platelets 227.  PT 13.2 and INR 1.0.  Chemistry shows a potassium of 3.4 mmol/L.  All other electrolytes are within normal range when calcium is corrected to albumin.  Glucose 328, BUN 30 and creatinine 0.75 mg/dL.  LFTs show an albumin of 3.4 g/dL, the rest of the measurements are unremarkable.  The patient was given 25 mcg of fentanyl.  I did Toradol 15 mg IVP x1 and 20 mEq of KCl p.o. x1.  Imaging: Right hip x-ray shows comminuted appearing intertrochanteric fracture of the right femoral neck.  Her chest radiograph does not have any active disease.   Subjective: 6/25 afebrile last 24 hours.  A/O x1 (does not know where, when, why).  Pleasantly demented.  Patient refuses exam today.   Assessment & Plan: Covid vaccination;   Principal Problem:   Closed fracture of neck of right femur ([place]) Active Problems:   Alzheimer's disease ([place])   Coronary atherosclerosis of native coronary artery   Essential hypertension, benign   Mixed hyperlipidemia   Chronic atrial fibrillation (HCC)   Type 2 diabetes mellitus (HCC)   Hypokalemia   Candidiasis of breast  Closed  fracture of neck of right femur ([place]) -6/23 s/p repair of femur see procedure below -Currently pain controlled -Patient refused to work with PT/OT today, per daughter most likely secondary to her dementia and distrust of people. -6/24 PT recommends SNF.  Speak with LCSW in a.m. to begin procedure of placement back to [facility] SNF  Hypokalemia -Resolved  Coronary atherosclerosis of native coronary artery -ASA 81 mg daily   Essential hypertension, benign -BP controlled without medication  Chronic atrial fibrillation ([place]) -CHA?DS?-VASc Score least 6. -Currently NSR  Type 2 diabetes mellitus uncontrolled with complication -F6O pending -Levemir 15 units daily -6/25 NovoLog 8 units qac -Moderate SSI -Metformin 1000 mg BID  Mixed hyperlipidemia -6/25 LDL= 85 -Lipitor 20 mg daily    GERD -Protonix 40 mg daily  Alzheimer's disease (HCC) Continue Cymbalta 60 mg p.o. every morning. Continue Depakote 250 mg p.o. 4 times daily. Continue trazodone 50 mg p.o. at bedtime. Supportive care.  Breast candidiasis -Tinactin powder BID   Goals of care -6/24 PT/OT consult; evaluate patient for SNF.  Patient resides at [facility] and they have physical therapy per daughter.  NOTE patient extremely afraid of men ensure evaluation team composed of WOMEN -6/25 spoke with NCM Sharyn Lull and informed her that PT/OT recommended SNF.  Patient is from [facility] and per family they have an SNF section. -6/25 spoke with NCM Levada Dy beginning to work on getting patient back to [facility] SNF   DVT  prophylaxis: Lovenox Code Status: DNR Family Communication: 6/25 daughter at bedside counseled that if patient refuses to participate with PT/OT secondary to her dementia whether it be here or at [facility] no one is going to physically force her out of the bed.  They will encourage her but if she continues to refuse will wind up being bedbound. Status is: Inpatient    Dispo: The  patient is from: [facility] SNF              Anticipated d/c is to: [facility] SNF              Anticipated d/c date is: 6/30              Patient currently unstable      Consultants:  Orthopedic surgery   Procedures/Significant Events:  6/23 CPT 27245-Cephalomedullary nailing of RIGHT intertrochanteric femur fracture   I have personally reviewed and interpreted all radiology studies and my findings are as above.  VENTILATOR SETTINGS:    Cultures   Antimicrobials:    Devices    LINES / TUBES:      Continuous Infusions: . dextrose 5 % and 0.9% NaCl 75 mL/hr at 07/22/19 0844  . methocarbamol (ROBAXIN) IV       Objective: Vitals:   07/21/19 1335 07/21/19 2022 07/22/19 0405 07/22/19 0800  BP: 127/62 116/61 118/63 (!) 127/57  Pulse: 78 93 96 87  Resp: 16 16 16 17   Temp: 97.8 F (36.6 C) 97.7 F (36.5 C) 98 F (36.7 C) 97.9 F (36.6 C)  TempSrc: Oral Oral Oral Oral  SpO2: 98% 96% 97% 100%  Weight:      Height:        Intake/Output Summary (Last 24 hours) at 07/22/2019 1023 Last data filed at 07/22/2019 0630 Gross per 24 hour  Intake 240 ml  Output 700 ml  Net -460 ml   Filed Weights   07/19/19 1610 07/20/19 0200  Weight: 54.4 kg 57.5 kg   Physical Exam:  General: A/O x1 (does not know where, when, why), no acute respiratory distress -Unable to complete exam, patient refuses to allow further examination even with family encouraging her.     Data Reviewed: Care during the described time interval was provided by me .  I have reviewed this patient's available data, including medical history, events of note, physical examination, and all test results as part of my evaluation.  CBC: Recent Labs  Lab 07/19/19 1639 07/20/19 1421 07/21/19 0411 07/22/19 0328  WBC 10.4 7.6 8.1 8.1  NEUTROABS 7.8* 4.6 6.2 5.3  HGB 12.2 11.4* 10.6* 10.4*  HCT 36.3 34.2* 31.8* 31.2*  MCV 90.5 91.2 91.9 92.0  PLT 227 243 230 200   Basic Metabolic  Panel: Recent Labs  Lab 07/19/19 1639 07/20/19 1421 07/21/19 0411 07/22/19 0328  NA 136 141 137 138  K 3.4* 3.9 4.3 4.0  CL 100 107 103 102  CO2 24 24 23 26   GLUCOSE 328* 111* 316* 195*  BUN 30* 35* 34* 29*  CREATININE 0.75 0.69 0.69 0.71  CALCIUM 8.7* 8.8* 8.7* 8.5*  MG 1.9 1.9 1.9 1.8  PHOS 2.9 3.8 4.8* 2.7   GFR: Estimated Creatinine Clearance: 45 mL/min (by C-G formula based on SCr of 0.71 mg/dL). Liver Function Tests: Recent Labs  Lab 07/19/19 1639 07/20/19 1421 07/21/19 0411 07/22/19 0328  AST 11* 11* 13* 13*  ALT 12 12 14 9   ALKPHOS 50 45 45 43  BILITOT 0.9 1.3* 1.1 0.8  PROT 7.0 6.0* 6.1* 5.3*  ALBUMIN 3.4* 2.9* 2.8* 2.5*   No results for input(s): LIPASE, AMYLASE in the last 168 hours. No results for input(s): AMMONIA in the last 168 hours. Coagulation Profile: Recent Labs  Lab 07/19/19 1639  INR 1.0   Cardiac Enzymes: No results for input(s): CKTOTAL, CKMB, CKMBINDEX, TROPONINI in the last 168 hours. BNP (last 3 results) No results for input(s): PROBNP in the last 8760 hours. HbA1C: Recent Labs    07/22/19 0328  HGBA1C 8.4*   CBG: Recent Labs  Lab 07/21/19 0823 07/21/19 1137 07/21/19 1653 07/21/19 2148 07/22/19 0802  GLUCAP 287* 325* 370* 170* 220*   Lipid Profile: Recent Labs    07/22/19 0328  CHOL 129  HDL 26*  LDLCALC 85  TRIG 92  CHOLHDL 5.0   Thyroid Function Tests: No results for input(s): TSH, T4TOTAL, FREET4, T3FREE, THYROIDAB in the last 72 hours. Anemia Panel: No results for input(s): VITAMINB12, FOLATE, FERRITIN, TIBC, IRON, RETICCTPCT in the last 72 hours. Sepsis Labs: No results for input(s): PROCALCITON, LATICACIDVEN in the last 168 hours.  Recent Results (from the past 240 hour(s))  SARS Coronavirus 2 by RT PCR (hospital order, performed in [facility] hospital lab) Nasopharyngeal Nasopharyngeal Swab     Status: None   Collection Time: 07/19/19  5:41 PM   Specimen: Nasopharyngeal Swab  Result Value Ref Range  Status   SARS Coronavirus 2 NEGATIVE NEGATIVE Final    Comment: (NOTE) SARS-CoV-2 target nucleic acids are NOT DETECTED.  The SARS-CoV-2 RNA is generally detectable in upper and lower respiratory specimens during the acute phase of infection. The lowest concentration of SARS-CoV-2 viral copies this assay can detect is 250 copies / mL. A negative result does not preclude SARS-CoV-2 infection and should not be used as the sole basis for treatment or other patient management decisions.  A negative result may occur with improper specimen collection / handling, submission of specimen other than nasopharyngeal swab, presence of viral mutation(s) within the areas targeted by this assay, and inadequate number of viral copies (<250 copies / mL). A negative result must be combined with clinical observations, patient history, and epidemiological information.  Fact Sheet for Patients:   BoilerBrush.com.cy  Fact Sheet for Healthcare Providers: https://pope.com/  This test is not yet approved or  cleared by the [place] FDA and has been authorized for detection and/or diagnosis of SARS-CoV-2 by FDA under an Emergency Use Authorization (EUA).  This EUA will remain in effect (meaning this test can be used) for the duration of the COVID-19 declaration under Section 564(b)(1) of the Act, 21 U.S.C. section 360bbb-3(b)(1), unless the authorization is terminated or revoked sooner.  Performed at [facility], [address] [place], [place] [address]   Surgical pcr screen     Status: Abnormal   Collection Time: 07/20/19  2:20 AM   Specimen: Nasal Mucosa; Nasal Swab  Result Value Ref Range Status   MRSA, PCR NEGATIVE NEGATIVE Final   Staphylococcus aureus POSITIVE (A) NEGATIVE Final    Comment: CRITICAL RESULT CALLED TO, READ BACK BY AND VERIFIED WITH: RN Forestine Na [id number] @[id number]  THANEY Performed at [facility], [address],  [place], [place]          Radiology Studies: DG C-Arm 1-60 Min  Result Date: 07/20/2019 CLINICAL DATA:  IM nail right femur. EXAM: DG C-ARM 1-60 MIN; OPERATIVE RIGHT HIP WITH PELVIS FLUOROSCOPY TIME:  Fluoroscopy Time:  1 minutes 21 seconds Radiation Exposure Index (if  provided by the fluoroscopic device): 9.02 mGy Number of Acquired Spot Images: 6 COMPARISON:  Preoperative radiograph yesterday. FINDINGS: Six fluoroscopic spot views obtained in the operating room. Intramedullary nail with trans trochanteric and distal locking screws fixate intertrochanteric femur fracture. Fluoroscopy time 1 minutes 21 seconds, dose 9.02 mGy. IMPRESSION: Procedural fluoroscopy during IM nailing of right proximal femur fracture. Electronically Signed   By: Narda Rutherford M.D.   On: 07/20/2019 19:33   DG HIP PORT UNILAT WITH PELVIS 1V RIGHT  Result Date: 07/20/2019 CLINICAL DATA:  Postop IM nail. EXAM: DG HIP (WITH OR WITHOUT PELVIS) 1V PORT RIGHT COMPARISON:  Preoperative radiograph yesterday. FINDINGS: Intramedullary nail with trans trochanteric and distal locking screw traverse intertrochanteric femur fracture. The fracture is in improved alignment compared to preoperative imaging. No periprosthetic lucency or new fracture. Recent postsurgical change includes air and edema in the soft tissues. IMPRESSION: ORIF right intertrochanteric femur fracture. No immediate postoperative complication. Electronically Signed   By: Narda Rutherford M.D.   On: 07/20/2019 19:34   DG HIP OPERATIVE UNILAT W OR W/O PELVIS RIGHT  Result Date: 07/20/2019 CLINICAL DATA:  IM nail right femur. EXAM: DG C-ARM 1-60 MIN; OPERATIVE RIGHT HIP WITH PELVIS FLUOROSCOPY TIME:  Fluoroscopy Time:  1 minutes 21 seconds Radiation Exposure Index (if provided by the fluoroscopic device): 9.02 mGy Number of Acquired Spot Images: 6 COMPARISON:  Preoperative radiograph yesterday. FINDINGS: Six fluoroscopic spot views obtained in the operating room.  Intramedullary nail with trans trochanteric and distal locking screws fixate intertrochanteric femur fracture. Fluoroscopy time 1 minutes 21 seconds, dose 9.02 mGy. IMPRESSION: Procedural fluoroscopy during IM nailing of right proximal femur fracture. Electronically Signed   By: Narda Rutherford M.D.   On: 07/20/2019 19:33        Scheduled Meds: . aspirin EC  81 mg Oral Daily  . Chlorhexidine Gluconate Cloth  6 each Topical Daily  . divalproex  250 mg Oral QID  . docusate sodium  100 mg Oral BID  . DULoxetine  60 mg Oral q morning - 10a  . enoxaparin (LOVENOX) injection  40 mg Subcutaneous Q24H  . feeding supplement (GLUCERNA SHAKE)  237 mL Oral TID BM  . insulin aspart  0-15 Units Subcutaneous TID WC  . insulin aspart  4 Units Subcutaneous Once  . insulin detemir  15 Units Subcutaneous QHS  . magnesium oxide  400 mg Oral Daily  . metFORMIN  1,000 mg Oral BID WC  . multivitamin with minerals  1 tablet Oral Daily  . mupirocin ointment  1 application Nasal BID  . nystatin   Topical BID  . pantoprazole  40 mg Oral Daily  . senna-docusate  1 tablet Oral BID  . traZODone  50 mg Oral QHS   Continuous Infusions: . dextrose 5 % and 0.9% NaCl 75 mL/hr at 07/22/19 0844  . methocarbamol (ROBAXIN) IV       LOS: 3 days    Time spent:40 min    Jaevin Medearis, Roselind Messier, MD [facility] Pager [phone]  If 7PM-7AM, please contact night-coverage [email or web address] Password [facility] 07/22/2019, 10:23 AM

## 2019-07-23 LAB — CBC WITH DIFFERENTIAL/PLATELET
Abs Immature Granulocytes: 0.03 10*3/uL (ref 0.00–0.07)
Basophils Absolute: 0 10*3/uL (ref 0.0–0.1)
Basophils Relative: 1 %
Eosinophils Absolute: 0.3 10*3/uL (ref 0.0–0.5)
Eosinophils Relative: 3 %
HCT: 32.1 % — ABNORMAL LOW (ref 36.0–46.0)
Hemoglobin: 10.5 g/dL — ABNORMAL LOW (ref 12.0–15.0)
Immature Granulocytes: 0 %
Lymphocytes Relative: 18 %
Lymphs Abs: 1.5 10*3/uL (ref 0.7–4.0)
MCH: 30.4 pg (ref 26.0–34.0)
MCHC: 32.7 g/dL (ref 30.0–36.0)
MCV: 93 fL (ref 80.0–100.0)
Monocytes Absolute: 0.8 10*3/uL (ref 0.1–1.0)
Monocytes Relative: 10 %
Neutro Abs: 5.4 10*3/uL (ref 1.7–7.7)
Neutrophils Relative %: 68 %
Platelets: 221 10*3/uL (ref 150–400)
RBC: 3.45 MIL/uL — ABNORMAL LOW (ref 3.87–5.11)
RDW: 13.1 % (ref 11.5–15.5)
WBC: 8 10*3/uL (ref 4.0–10.5)
nRBC: 0 % (ref 0.0–0.2)

## 2019-07-23 LAB — COMPREHENSIVE METABOLIC PANEL
ALT: 7 U/L (ref 0–44)
AST: 19 U/L (ref 15–41)
Albumin: 2.5 g/dL — ABNORMAL LOW (ref 3.5–5.0)
Alkaline Phosphatase: 46 U/L (ref 38–126)
Anion gap: 11 (ref 5–15)
BUN: 16 mg/dL (ref 8–23)
CO2: 23 mmol/L (ref 22–32)
Calcium: 8.3 mg/dL — ABNORMAL LOW (ref 8.9–10.3)
Chloride: 104 mmol/L (ref 98–111)
Creatinine, Ser: 0.51 mg/dL (ref 0.44–1.00)
GFR calc Af Amer: >60 mL/min (ref >60)
GFR calc non Af Amer: >60 mL/min (ref >60)
Glucose, Bld: 193 mg/dL — ABNORMAL HIGH (ref 70–99)
Potassium: 3.9 mmol/L (ref 3.5–5.1)
Sodium: 138 mmol/L (ref 135–145)
Total Bilirubin: 0.8 mg/dL (ref 0.3–1.2)
Total Protein: 5.4 g/dL — ABNORMAL LOW (ref 6.5–8.1)

## 2019-07-23 LAB — PHOSPHORUS: Phosphorus: 1.9 mg/dL — ABNORMAL LOW (ref 2.5–4.6)

## 2019-07-23 LAB — MAGNESIUM: Magnesium: 1.6 mg/dL — ABNORMAL LOW (ref 1.7–2.4)

## 2019-07-23 LAB — GLUCOSE, CAPILLARY
Glucose-Capillary: 174 mg/dL — ABNORMAL HIGH (ref 70–99)
Glucose-Capillary: 189 mg/dL — ABNORMAL HIGH (ref 70–99)
Glucose-Capillary: 147 mg/dL — ABNORMAL HIGH (ref 70–99)
Glucose-Capillary: 166 mg/dL — ABNORMAL HIGH (ref 70–99)

## 2019-07-23 MED ORDER — MAGNESIUM SULFATE 50 % IJ SOLN
3.0000 g | Freq: Once | INTRAVENOUS | Status: AC
  Administered 2019-07-23: 3 g via INTRAVENOUS
  Filled 2019-07-23: qty 6

## 2019-07-23 NOTE — Progress Notes (Addendum)
PROGRESS NOTE    Valerie Carr  VQM:[id number] DOB: 1929-11-02 DOA: 07/19/2019 PCP: Lindaann Pascal     Brief Narrative:  86 y.o. WF PMHx arteriosclerotic, CAD, HTN, Sick Sinus Syndrome, HLD, chronic Anxiety and Depression, dimension, DM type II, cholelithiasis,   who is coming to the emergency department after sustaining an undetermined type of injury at the nursing home falling on her right hip area.  She has a history of dementia and is unable to contribute further to the HPI.  She is able to answer simple questions and answer no to headache, chest pain, abdominal pain.  She is stated that the hip hurts a little, but not too much.  ED Course: Initial vital signs were temperature 98.1 F, pulse 90, respirations 26, blood pressure 140/79 and O2 sat 98% on room air.  CBC showed a White count 7.4, hemoglobin 12.2 g/dL and platelets 932.  PT 13.2 and INR 1.0.  Chemistry shows a potassium of 3.4 mmol/L.  All other electrolytes are within normal range when calcium is corrected to albumin.  Glucose 328, BUN 30 and creatinine 0.75 mg/dL.  LFTs show an albumin of 3.4 g/dL, the rest of the measurements are unremarkable.  The patient was given 25 mcg of fentanyl.  I did Toradol 15 mg IVP x1 and 20 mEq of KCl p.o. x1.  Imaging: Right hip x-ray shows comminuted appearing intertrochanteric fracture of the right femoral neck.  Her chest radiograph does not have any active disease.   Subjective: 6/26 afebrile A/O x1 (does not know where, when, why).  Pleasantly demented.   Assessment & Plan: Covid vaccination;   Principal Problem:   Closed fracture of neck of right femur (HCC) Active Problems:   Alzheimer's disease (HCC)   Coronary atherosclerosis of native coronary artery   Essential hypertension, benign   Mixed hyperlipidemia   Chronic atrial fibrillation (HCC)   Type 2 diabetes mellitus (HCC)   Hypokalemia   Candidiasis of breast  Closed fracture of neck of right femur (HCC) -6/23  s/p repair of femur see procedure below -Currently pain controlled -Patient refused to work with PT/OT today, per daughter most likely secondary to her dementia and distrust of people. -6/24 PT recommends SNF.  Speak with LCSW in a.m. to begin procedure of placement back to [facility] SNF  Hypokalemia -Resolved  Coronary atherosclerosis of native coronary artery -ASA 81 mg daily   Essential hypertension, benign -BP controlled without medication  Chronic atrial fibrillation (HCC) -CHA?DS?-VASc Score least 6. -Currently NSR  Type 2 diabetes mellitus uncontrolled with complication -A1c pending -Levemir 15 units daily -6/25 NovoLog 8 units qac -Moderate SSI -Metformin 1000 mg BID  Mixed hyperlipidemia -6/25 LDL= 85 -Lipitor 20 mg daily    GERD -Protonix 40 mg daily  Alzheimer's disease (HCC) Continue Cymbalta 60 mg p.o. every morning. Continue Depakote 250 mg p.o. 4 times daily. Continue trazodone 50 mg p.o. at bedtime. Supportive care.  Breast candidiasis -Tinactin powder BID  Hypomagnesmia -Magnesium goal> 2 -Magnesium IV 3 g -Magnesium oxide 400 mg daily  Goals of care -6/24 PT/OT consult; evaluate patient for SNF.  Patient resides at [facility] and they have physical therapy per daughter.  NOTE patient extremely afraid of men ensure evaluation team composed of WOMEN -6/25 spoke with NCM Marcelino Duster and informed her that PT/OT recommended SNF.  Patient is from [facility] and per family they have an SNF section. -6/25 spoke with NCM Marylene Land beginning to work on getting patient back to [facility]  SNF   DVT prophylaxis: Lovenox Code Status: DNR Family Communication: 6/26 attempted to contact daughter and let her know that patient will be discharged in a.m. however no answer on phone and phones mailbox was full.     Dispo: The patient is from: [facility] SNF              Anticipated d/c is to: [facility] SN[facility]              Anticipated d/c date is:  6/30              Patient currently unstable      Consultants:  Orthopedic surgery   Procedures/Significant Events:  6/23 CPT 27245-Cephalomedullary nailing of RIGHT intertrochanteric femur fracture   I have personally reviewed and interpreted all radiology studies and my findings are as above.  VENTILATOR SETTINGS:    Cultures   Antimicrobials:    Devices    LINES / TUBES:      Continuous Infusions: . dextrose 5 % and 0.9% NaCl 75 mL/hr at 07/22/19 2115  . methocarbamol (ROBAXIN) IV       Objective: Vitals:   07/22/19 1405 07/22/19 1953 07/23/19 0438 07/23/19 0807  BP: 122/68 (!) 120/91 128/82 124/72  Pulse: 90 (!) 103 100 92  Resp: 18 16 16 16   Temp: 97.9 F (36.6 C) (!) 97.4 F (36.3 C) 98.7 F (37.1 C) 98.4 F (36.9 C)  TempSrc: Oral Oral Oral Axillary  SpO2: 94% 98% 100% 100%  Weight:      Height:        Intake/Output Summary (Last 24 hours) at 07/23/2019 1004 Last data filed at 07/23/2019 3244 Gross per 24 hour  Intake --  Output 800 ml  Net -800 ml   Filed Weights   07/19/19 1610 07/20/19 0200  Weight: 54.4 kg 57.5 kg    Physical Exam:  General: A/O x1 (does not know where, when, why) no acute respiratory distress Eyes: negative scleral hemorrhage, negative anisocoria, negative icterus ENT: Negative Runny nose, negative gingival bleeding, Neck:  Negative scars, masses, torticollis, lymphadenopathy, JVD Lungs: Clear to auscultation bilaterally without wheezes or crackles Cardiovascular: Regular rate and rhythm without murmur gallop or rub normal S1 and S2 Abdomen: negative abdominal pain, nondistended, positive soft, bowel sounds, no rebound, no ascites, no appreciable mass Extremities: No significant cyanosis, clubbing, or edema bilateral lower extremities Skin: Negative rashes, lesions, ulcers Psychiatric:  Negative depression, negative anxiety, negative fatigue, negative mania  Central nervous system:  Cranial nerves II through  XII intact, tongue/uvula midline, all extremities muscle strength 5/5, sensation intact throughout, negative dysarthria, negative expressive aphasia, negative receptive aphasia.    Data Reviewed: Care during the described time interval was provided by me .  I have reviewed this patient's available data, including medical history, events of note, physical examination, and all test results as part of my evaluation.  CBC: Recent Labs  Lab 07/19/19 1639 07/20/19 1421 07/21/19 0411 07/22/19 0328 07/23/19 0446  WBC 10.4 7.6 8.1 8.1 8.0  NEUTROABS 7.8* 4.6 6.2 5.3 5.4  HGB 12.2 11.4* 10.6* 10.4* 10.5*  HCT 36.3 34.2* 31.8* 31.2* 32.1*  MCV 90.5 91.2 91.9 92.0 93.0  PLT 227 243 230 200 010   Basic Metabolic Panel: Recent Labs  Lab 07/19/19 1639 07/20/19 1421 07/21/19 0411 07/22/19 0328 07/23/19 0446  NA 136 141 137 138 138  K 3.4* 3.9 4.3 4.0 3.9  CL 100 107 103 102 104  CO2 24 24 23 26  23  GLUCOSE 328* 111* 316* 195* 193*  BUN 30* 35* 34* 29* 16  CREATININE 0.75 0.69 0.69 0.71 0.51  CALCIUM 8.7* 8.8* 8.7* 8.5* 8.3*  MG 1.9 1.9 1.9 1.8 1.6*  PHOS 2.9 3.8 4.8* 2.7 1.9*   GFR: Estimated Creatinine Clearance: 45 mL/min (by C-G formula based on SCr of 0.51 mg/dL). Liver Function Tests: Recent Labs  Lab 07/19/19 1639 07/20/19 1421 07/21/19 0411 07/22/19 0328 07/23/19 0446  AST 11* 11* 13* 13* 19  ALT 12 12 14 9 7   ALKPHOS 50 45 45 43 46  BILITOT 0.9 1.3* 1.1 0.8 0.8  PROT 7.0 6.0* 6.1* 5.3* 5.4*  ALBUMIN 3.4* 2.9* 2.8* 2.5* 2.5*   No results for input(s): LIPASE, AMYLASE in the last 168 hours. No results for input(s): AMMONIA in the last 168 hours. Coagulation Profile: Recent Labs  Lab 07/19/19 1639  INR 1.0   Cardiac Enzymes: No results for input(s): CKTOTAL, CKMB, CKMBINDEX, TROPONINI in the last 168 hours. BNP (last 3 results) No results for input(s): PROBNP in the last 8760 hours. HbA1C: Recent Labs    07/22/19 0328  HGBA1C 8.4*   CBG: Recent Labs    Lab 07/21/19 2148 07/22/19 0802 07/22/19 1554 07/22/19 2040 07/23/19 0804  GLUCAP 170* 220* 124* 135* 174*   Lipid Profile: Recent Labs    07/22/19 0328  CHOL 129  HDL 26*  LDLCALC 85  TRIG 92  CHOLHDL 5.0   Thyroid Function Tests: No results for input(s): TSH, T4TOTAL, FREET4, T3FREE, THYROIDAB in the last 72 hours. Anemia Panel: No results for input(s): VITAMINB12, FOLATE, FERRITIN, TIBC, IRON, RETICCTPCT in the last 72 hours. Sepsis Labs: No results for input(s): PROCALCITON, LATICACIDVEN in the last 168 hours.  Recent Results (from the past 240 hour(s))  SARS Coronavirus 2 by RT PCR (hospital order, performed in [facility] hospital lab) Nasopharyngeal Nasopharyngeal Swab     Status: None   Collection Time: 07/19/19  5:41 PM   Specimen: Nasopharyngeal Swab  Result Value Ref Range Status   SARS Coronavirus 2 NEGATIVE NEGATIVE Final    Comment: (NOTE) SARS-CoV-2 target nucleic acids are NOT DETECTED.  The SARS-CoV-2 RNA is generally detectable in upper and lower respiratory specimens during the acute phase of infection. The lowest concentration of SARS-CoV-2 viral copies this assay can detect is 250 copies / mL. A negative result does not preclude SARS-CoV-2 infection and should not be used as the sole basis for treatment or other patient management decisions.  A negative result may occur with improper specimen collection / handling, submission of specimen other than nasopharyngeal swab, presence of viral mutation(s) within the areas targeted by this assay, and inadequate number of viral copies (<250 copies / mL). A negative result must be combined with clinical observations, patient history, and epidemiological information.  Fact Sheet for Patients:   07/21/19  Fact Sheet for Healthcare Providers: BoilerBrush.com.cy  This test is not yet approved or  cleared by the https://pope.com/ FDA and has been  authorized for detection and/or diagnosis of SARS-CoV-2 by FDA under an Emergency Use Authorization (EUA).  This EUA will remain in effect (meaning this test can be used) for the duration of the COVID-19 declaration under Section 564(b)(1) of the Act, 21 U.S.C. section 360bbb-3(b)(1), unless the authorization is terminated or revoked sooner.  Performed at [facility], [address] [place], [place]   Surgical pcr screen     Status: Abnormal   Collection Time: 07/20/19  2:20 AM   Specimen: Nasal  Mucosa; Nasal Swab  Result Value Ref Range Status   MRSA, PCR NEGATIVE NEGATIVE Final   Staphylococcus aureus POSITIVE (A) NEGATIVE Final    Comment: CRITICAL RESULT CALLED TO, READ BACK BY AND VERIFIED WITH: RN Forestine Na [id number] @[id number]  THANEY Performed at [facility], [address] [place], [place]          Radiology Studies: No results found.      Scheduled Meds: . aspirin EC  81 mg Oral Daily  . atorvastatin  20 mg Oral Daily  . Chlorhexidine Gluconate Cloth  6 each Topical Daily  . divalproex  250 mg Oral QID  . docusate sodium  100 mg Oral BID  . DULoxetine  60 mg Oral q morning - 10a  . enoxaparin (LOVENOX) injection  40 mg Subcutaneous Q24H  . feeding supplement (GLUCERNA SHAKE)  237 mL Oral TID BM  . insulin aspart  0-15 Units Subcutaneous TID WC  . insulin aspart  8 Units Subcutaneous TID WC  . insulin detemir  15 Units Subcutaneous QHS  . magnesium oxide  400 mg Oral Daily  . metFORMIN  1,000 mg Oral BID WC  . multivitamin with minerals  1 tablet Oral Daily  . mupirocin ointment  1 application Nasal BID  . nystatin   Topical BID  . pantoprazole  40 mg Oral Daily  . senna-docusate  1 tablet Oral BID  . traZODone  50 mg Oral QHS   Continuous Infusions: . dextrose 5 % and 0.9% NaCl 75 mL/hr at 07/22/19 2115  . methocarbamol (ROBAXIN) IV       LOS: 4 days    Time spent:40 min    Shellye Zandi, 2116, MD [facility] Pager [phone]  If 7PM-7AM, please contact night-coverage [email or web address] Password [facility] 07/23/2019, 10:04 AM

## 2019-07-23 NOTE — TOC Progression Note (Addendum)
Transition of Care [facility]) - Progression Note    Patient Details  Name: Valerie Carr MRN: [id number] Date of Birth: July 16, 1929  Transition of Care [facility]) CM/SW Contact  Jimmy Picket, [place] Phone Number: 07/23/2019, 3:23 PM  Clinical Narrative:     4:36 CSW was informed that pt will be discharged tomorrow. CSW informed [facility] and pts daughter [name].   CSW spoke to pys daughter [name], who confirms pt will return to [facility].   CSW spoke to stephanie at [facility] who confirmed that they are ready for pt to return today.  Expected Discharge Plan: Skilled Nursing Facility [facility]) Barriers to Discharge: Continued Medical Work up  Expected Discharge Plan and Services Expected Discharge Plan: Skilled Nursing Facility [facility])                                               Social Determinants of Health (SDOH) Interventions    Readmission Risk Interventions No flowsheet data found.  Jimmy Picket, Theresia Majors, [place] Clinical Social Worker [phone]

## 2019-07-24 DIAGNOSIS — G300 Alzheimer's disease with early onset: Secondary | ICD-10-CM

## 2019-07-24 LAB — CBC WITH DIFFERENTIAL/PLATELET
Abs Immature Granulocytes: 0.04 10*3/uL (ref 0.00–0.07)
Basophils Absolute: 0 10*3/uL (ref 0.0–0.1)
Basophils Relative: 1 %
Eosinophils Absolute: 0.1 10*3/uL (ref 0.0–0.5)
Eosinophils Relative: 2 %
HCT: 28.5 % — ABNORMAL LOW (ref 36.0–46.0)
Hemoglobin: 9.6 g/dL — ABNORMAL LOW (ref 12.0–15.0)
Immature Granulocytes: 1 %
Lymphocytes Relative: 17 %
Lymphs Abs: 1 10*3/uL (ref 0.7–4.0)
MCH: 31.1 pg (ref 26.0–34.0)
MCHC: 33.7 g/dL (ref 30.0–36.0)
MCV: 92.2 fL (ref 80.0–100.0)
Monocytes Absolute: 0.9 10*3/uL (ref 0.1–1.0)
Monocytes Relative: 14 %
Neutro Abs: 4 10*3/uL (ref 1.7–7.7)
Neutrophils Relative %: 65 %
Platelets: 251 10*3/uL (ref 150–400)
RBC: 3.09 MIL/uL — ABNORMAL LOW (ref 3.87–5.11)
RDW: 13.2 % (ref 11.5–15.5)
WBC: 6.1 10*3/uL (ref 4.0–10.5)
nRBC: 0 % (ref 0.0–0.2)

## 2019-07-24 LAB — COMPREHENSIVE METABOLIC PANEL
ALT: 6 U/L (ref 0–44)
AST: 12 U/L — ABNORMAL LOW (ref 15–41)
Albumin: 2.3 g/dL — ABNORMAL LOW (ref 3.5–5.0)
Alkaline Phosphatase: 42 U/L (ref 38–126)
Anion gap: 6 (ref 5–15)
BUN: 12 mg/dL (ref 8–23)
CO2: 29 mmol/L (ref 22–32)
Calcium: 7.9 mg/dL — ABNORMAL LOW (ref 8.9–10.3)
Chloride: 100 mmol/L (ref 98–111)
Creatinine, Ser: 0.53 mg/dL (ref 0.44–1.00)
GFR calc Af Amer: >60 mL/min (ref >60)
GFR calc non Af Amer: >60 mL/min (ref >60)
Glucose, Bld: 254 mg/dL — ABNORMAL HIGH (ref 70–99)
Potassium: 3.3 mmol/L — ABNORMAL LOW (ref 3.5–5.1)
Sodium: 135 mmol/L (ref 135–145)
Total Bilirubin: 0.6 mg/dL (ref 0.3–1.2)
Total Protein: 5.5 g/dL — ABNORMAL LOW (ref 6.5–8.1)

## 2019-07-24 LAB — GLUCOSE, CAPILLARY
Glucose-Capillary: 215 mg/dL — ABNORMAL HIGH (ref 70–99)
Glucose-Capillary: 228 mg/dL — ABNORMAL HIGH (ref 70–99)

## 2019-07-24 LAB — PHOSPHORUS: Phosphorus: 1.8 mg/dL — ABNORMAL LOW (ref 2.5–4.6)

## 2019-07-24 LAB — MAGNESIUM: Magnesium: 1.8 mg/dL (ref 1.7–2.4)

## 2019-07-24 MED ORDER — ADULT MULTIVITAMIN W/MINERALS CH: 1.0000 | ORAL_TABLET | Freq: Every day | ORAL | 0 refills | Status: DC

## 2019-07-24 MED ORDER — METHOCARBAMOL 500 MG PO TABS: 500.0000 mg | ORAL_TABLET | Freq: Four times a day (QID) | ORAL | 0 refills | Status: DC | PRN

## 2019-07-24 MED ORDER — SENNOSIDES-DOCUSATE SODIUM 8.6-50 MG PO TABS: 1.0000 | ORAL_TABLET | Freq: Two times a day (BID) | ORAL | 0 refills | Status: DC

## 2019-07-24 MED ORDER — DOCUSATE SODIUM 100 MG PO CAPS: 100.0000 mg | ORAL_CAPSULE | Freq: Two times a day (BID) | ORAL | 0 refills | Status: DC

## 2019-07-24 MED ORDER — DIVALPROEX SODIUM 125 MG PO CSDR: 250.0000 mg | DELAYED_RELEASE_CAPSULE | Freq: Four times a day (QID) | ORAL | 0 refills | Status: DC

## 2019-07-24 MED ORDER — INSULIN ASPART 100 UNIT/ML ~~LOC~~ SOLN: 8.0000 [IU] | Freq: Three times a day (TID) | SUBCUTANEOUS | 11 refills | Status: DC

## 2019-07-24 MED ORDER — ONDANSETRON HCL 4 MG PO TABS: 4.0000 mg | ORAL_TABLET | Freq: Four times a day (QID) | ORAL | 0 refills | Status: DC | PRN

## 2019-07-24 MED ORDER — MAGNESIUM SULFATE IN D5W 1-5 GM/100ML-% IV SOLN
1.0000 g | Freq: Once | INTRAVENOUS | Status: DC
  Filled 2019-07-24: qty 100

## 2019-07-24 MED ORDER — PANTOPRAZOLE SODIUM 40 MG PO TBEC: 40.0000 mg | DELAYED_RELEASE_TABLET | Freq: Every day | ORAL | 0 refills | Status: DC

## 2019-07-24 MED ORDER — ATORVASTATIN CALCIUM 20 MG PO TABS: 20.0000 mg | ORAL_TABLET | Freq: Every day | ORAL | 0 refills | Status: DC

## 2019-07-24 MED ORDER — POTASSIUM CHLORIDE CRYS ER 20 MEQ PO TBCR
40.0000 meq | EXTENDED_RELEASE_TABLET | Freq: Once | ORAL | Status: AC
  Administered 2019-07-24: 40 meq via ORAL
  Filled 2019-07-24: qty 2

## 2019-07-24 MED ORDER — NYSTATIN 100000 UNIT/GM EX POWD: Freq: Two times a day (BID) | CUTANEOUS | 0 refills | Status: DC

## 2019-07-24 MED ORDER — POTASSIUM CHLORIDE 10 MEQ/100ML IV SOLN: 10.0000 meq | INTRAVENOUS | Status: AC

## 2019-07-24 MED ORDER — MAGNESIUM OXIDE 400 (241.3 MG) MG PO TABS: 400.0000 mg | ORAL_TABLET | Freq: Every day | ORAL | 0 refills | Status: DC

## 2019-07-24 NOTE — TOC Transition Note (Signed)
Transition of Care [facility]) - CM/SW Discharge Note   Patient Details  Name: Valerie Carr MRN: [id number] Date of Birth: 01-02-1930  Transition of Care [facility]) CM/SW Contact:  Jimmy Picket, [place] Phone Number: 07/24/2019, 10:44 AM   Clinical Narrative:     Pt is discharging to [facility] via [employer]. Pts daughter [name] has been notified.   Nurse to call report to [phone].  Final next level of care: Skilled Nursing Facility Barriers to Discharge: Barriers Resolved   Patient Goals and CMS Choice Patient states their goals for this hospitalization and ongoing recovery are:: Pt unable to verbalize goals      Discharge Placement              Patient chooses bed at: [facility] Patient to be transferred to facility by: [facility] Name of family member notified: [name] Welcome Patient and family notified of of transfer: 07/24/19  Discharge Plan and Services                                     Social Determinants of Health (SDOH) Interventions     Readmission Risk Interventions No flowsheet data found.   Jimmy Picket, Theresia Majors, [place] Clinical Social Worker [phone]

## 2019-07-24 NOTE — Discharge Summary (Signed)
Physician Discharge Summary  Valerie Carr UEA:[id number] DOB: 1929/02/09 DOA: 07/19/2019  PCP: Lindaann [place], [place]  Admit date: 07/19/2019 Discharge date: 07/24/2019  Time spent: 30 minutes  Recommendations for Outpatient Follow-up:  Closed fracture of neck of right femur [facility](HCC) -6/23 s/p repair of femur see procedure below -Currently pain controlled -Patient refuses to work with PT/OT at times most likely secondary to her dementia and distrust of people. -6/[facility] -Follow-up with Dr. Caryn BeeKevin Haddix  orthopedic trauma specialist in 2 to 3 weeks for x-ray and wound check  Coronary atherosclerosis of native coronary artery -ASA 81 mg daily  Essential hypertension, benign -BP controlled without medication  Chronic atrial fibrillation (HCC) -CHA?DS?-VASc Scoreleast 6. -Currently NSR  Type 2 diabetes mellitus uncontrolled with complication -6/25 hemoglobin A1c = 8.4  -Levemir 15 units daily -6/25 NovoLog 8 units qac -Metformin 1000 mg BID -Follow-up in 2 weeks [place] [place] diabetes type 2 uncontrolled with complication, Alzheimer's disease,  Mixed hyperlipidemia -6/25 LDL= 85 -Lipitor 20 mg daily  GERD -Protonix 40 mg daily  Alzheimer's disease (HCC) Continue Cymbalta 60 mg p.o. every morning. Continue Depakote 250 mg p.o. 4 times daily. Continue trazodone 50 mg p.o. at bedtime. Supportive care.  Breast candidiasis -Tinactin powder BID  Hypokalemia -Potassium goal> 4 -Potassium IV 20 mEq +K-Dur 40 mEq prior to discharge  Hypomagnesmia -Magnesium goal> 2 -Magnesium IV 1 g prior to discharge -Magnesium oxide 400 mg daily   Discharge Diagnoses:  Principal Problem:   Closed fracture of neck of right femur (HCC) Active Problems:   Alzheimer's disease (HCC)   Coronary atherosclerosis of native coronary artery   Essential hypertension, benign   Mixed hyperlipidemia   Chronic atrial fibrillation (HCC)   Type 2 diabetes  mellitus (HCC)   Hypokalemia   Candidiasis of breast   Discharge Condition: Guarded  Diet recommendation: Regular fluid consistency thin  Filed Weights   07/19/19 1610 07/20/19 0200 07/24/19 0534  Weight: 54.4 kg 57.5 kg 58.6 kg    History of present illness:  86 y.o.WF PMHx arteriosclerotic, CAD, HTN, Sick Sinus Syndrome, HLD, chronic Anxiety and Depression, dimension, DM type II, cholelithiasis,   who is coming to the emergency department after sustaining an undetermined type of injury at the nursing home falling on her right hip area. She has a history of dementia and is unable to contribute further to the HPI. She is able to answer simple questions and answer no to headache, chest pain, abdominal pain. She is stated that the hip hurts a little, but not too much.  ED Course:Initial vital signs were temperature98.1 F, pulse 90, respirations 26, blood pressure 140/79 and O2 sat 98% on room air.  CBC showed a White count 7.4, hemoglobin 12.2 g/dL and platelets [id number]. PT 13.2 and INR 1.0. Chemistry shows a potassium of 3.4 mmol/L. All other electrolytes are within normal range when calcium is corrected to albumin. Glucose 328, BUN 30 and creatinine 0.75 mg/dL. LFTs show an albumin of 3.4 g/dL, the rest of the measurements are unremarkable. The patient was given 25 mcg of fentanyl. I did Toradol 15 mg IVP x1 and 20 mEq of KCl p.o. x1.  Imaging:Right hip x-ray shows comminuted appearing intertrochanteric fracture of the right femoral neck. Her chest radiograph does not have any active disease.   Hospital Course:  See above  Procedures: Orthopedic surgery  Consultations: 6/23 CPT 27245-Cephalomedullary nailing of RIGHT intertrochanteric femur fracture    Discharge Exam: Vitals:   07/23/19 1300  07/23/19 1955 07/24/19 0413 07/24/19 0534  BP: 118/75 117/78 123/68   Pulse: 98 88 78   Resp: 17 16 16    Temp: 98.2 F (36.8 C) 98.4 F (36.9 C) 97.8 F (36.6 C)    TempSrc: Axillary Oral Oral   SpO2: 98% 98% 99%   Weight:    58.6 kg  Height:        General: A/O x1 (does not know where, when, why) no acute respiratory distress Eyes: negative scleral hemorrhage, negative anisocoria, negative icterus ENT: Negative Runny nose, negative gingival bleeding, Neck:  Negative scars, masses, torticollis, lymphadenopathy, JVD Lungs: Clear to auscultation bilaterally without wheezes or crackles Cardiovascular: Regular rate and rhythm without murmur gallop or rub normal S1 and S2   Discharge Instructions   Allergies as of 07/24/2019      Reactions   Clarithromycin Other (See Comments)   unknown   Codeine Phosphate Other (See Comments)   unknown   Effexor [venlafaxine Hydrochloride] Other (See Comments)   unknown   Fexofenadine Hcl Other (See Comments)   unknown   Lisinopril Other (See Comments)   unknown   Prozac [fluoxetine Hcl] Other (See Comments)   unknown   Septra [bactrim] Other (See Comments)   unknown   Wellbutrin [bupropion Hcl] Other (See Comments)   unknown      Medication List    STOP taking these medications   omeprazole 20 MG capsule Commonly known as: PRILOSEC Replaced by: pantoprazole 40 MG tablet     TAKE these medications   acetaminophen 325 MG tablet Commonly known as: TYLENOL Take 1-2 tablets (325-650 mg total) by mouth every 6 (six) hours as needed for mild pain (pain score 1-3 or temp > 100.5).   aspirin EC 325 MG tablet Take 1 tablet (325 mg total) by mouth daily. What changed:   medication strength  how much to take  additional instructions   atorvastatin 20 MG tablet Commonly known as: LIPITOR Take 1 tablet (20 mg total) by mouth daily. Start taking on: July 25, 2019   divalproex 125 MG capsule Commonly known as: DEPAKOTE SPRINKLE Take 2 capsules (250 mg total) by mouth 4 (four) times daily. What changed: when to take this   docusate sodium 100 MG capsule Commonly known as: COLACE Take 1  capsule (100 mg total) by mouth 2 (two) times daily.   DULoxetine 60 MG capsule Commonly known as: CYMBALTA Take 60 mg by mouth every morning. For depression   ibuprofen 600 MG tablet Commonly known as: ADVIL Take 1 tablet (600 mg total) by mouth every 6 (six) hours as needed for moderate pain.   insulin aspart 100 UNIT/ML injection Commonly known as: novoLOG Inject 8 Units into the skin 3 (three) times daily before meals. What changed: how much to take   insulin detemir 100 UNIT/ML injection Commonly known as: LEVEMIR Inject 15 Units into the skin at bedtime.   magnesium oxide 400 (241.3 Mg) MG tablet Commonly known as: MAG-OX Take 1 tablet (400 mg total) by mouth daily. Start taking on: July 25, 2019   metFORMIN 1000 MG tablet Commonly known as: GLUCOPHAGE Take 1,000 mg by mouth 2 (two) times daily with a meal.   methocarbamol 500 MG tablet Commonly known as: ROBAXIN Take 1 tablet (500 mg total) by mouth every 6 (six) hours as needed for muscle spasms.   multivitamin with minerals Tabs tablet Take 1 tablet by mouth daily. Start taking on: July 25, 2019   nystatin powder Commonly known as:  MYCOSTATIN/NYSTOP Apply topically 2 (two) times daily.   ondansetron 4 MG tablet Commonly known as: ZOFRAN Take 1 tablet (4 mg total) by mouth every 6 (six) hours as needed for nausea.   pantoprazole 40 MG tablet Commonly known as: PROTONIX Take 1 tablet (40 mg total) by mouth daily. Start taking on: July 25, 2019 Replaces: omeprazole 20 MG capsule   senna-docusate 8.6-50 MG tablet Commonly known as: Senokot-S Take 1 tablet by mouth 2 (two) times daily. What changed: when to take this   traZODone 50 MG tablet Commonly known as: DESYREL Take 50 mg by mouth at bedtime.   Vitamin D3 50 MCG (2000 UT) Tabs Take 2,000 Units by mouth every morning.      Allergies  Allergen Reactions  . Clarithromycin Other (See Comments)    unknown  . Codeine Phosphate Other (See  Comments)    unknown  . Effexor [Venlafaxine Hydrochloride] Other (See Comments)    unknown  . Fexofenadine Hcl Other (See Comments)    unknown  . Lisinopril Other (See Comments)    unknown  . Prozac [Fluoxetine Hcl] Other (See Comments)    unknown  . Septra [Bactrim] Other (See Comments)    unknown  . Wellbutrin [Bupropion Hcl] Other (See Comments)    unknown      The results of significant diagnostics from this hospitalization (including imaging, microbiology, ancillary and laboratory) are listed below for reference.    Significant Diagnostic Studies: DG Chest Portable 1 View  Result Date: 07/19/2019 CLINICAL DATA:  86 year female with right hip fracture. Preop radiograph. EXAM: PORTABLE CHEST 1 VIEW COMPARISON:  Chest radiograph dated 10/19/2018. FINDINGS: No focal consolidation, pleural effusion, pneumothorax. The cardiac silhouette is within limits. Atherosclerotic calcification of the aorta. Median sternotomy wires, CABG vascular clips, and left pectoral pacemaker device. No acute osseous pathology. IMPRESSION: No active disease. Electronically Signed   By: Elgie Collard M.D.   On: 07/19/2019 18:29   DG Knee Complete 4 Views Right  Result Date: 07/20/2019 CLINICAL DATA:  Status post fall. Right knee pain. Initial encounter. EXAM: RIGHT KNEE - COMPLETE 4+ VIEW COMPARISON:  None. FINDINGS: There is no acute bony or joint abnormality. Trace amount of joint fluid. Joint spaces preserved. Chondrocalcinosis atherosclerosis noted. IMPRESSION: No acute abnormality. Electronically Signed   By: Drusilla Kanner M.D.   On: 07/20/2019 13:28   DG C-Arm 1-60 Min  Result Date: 07/20/2019 CLINICAL DATA:  IM nail right femur. EXAM: DG C-ARM 1-60 MIN; OPERATIVE RIGHT HIP WITH PELVIS FLUOROSCOPY TIME:  Fluoroscopy Time:  1 minutes 21 seconds Radiation Exposure Index (if provided by the fluoroscopic device): 9.02 mGy Number of Acquired Spot Images: 6 COMPARISON:  Preoperative radiograph  yesterday. FINDINGS: Six fluoroscopic spot views obtained in the operating room. Intramedullary nail with trans trochanteric and distal locking screws fixate intertrochanteric femur fracture. Fluoroscopy time 1 minutes 21 seconds, dose 9.02 mGy. IMPRESSION: Procedural fluoroscopy during IM nailing of right proximal femur fracture. Electronically Signed   By: Narda Rutherford M.D.   On: 07/20/2019 19:33   DG HIP PORT UNILAT WITH PELVIS 1V RIGHT  Result Date: 07/20/2019 CLINICAL DATA:  Postop IM nail. EXAM: DG HIP (WITH OR WITHOUT PELVIS) 1V PORT RIGHT COMPARISON:  Preoperative radiograph yesterday. FINDINGS: Intramedullary nail with trans trochanteric and distal locking screw traverse intertrochanteric femur fracture. The fracture is in improved alignment compared to preoperative imaging. No periprosthetic lucency or new fracture. Recent postsurgical change includes air and edema in the soft tissues. IMPRESSION: ORIF right  intertrochanteric femur fracture. No immediate postoperative complication. Electronically Signed   By: Keith Rake M.D.   On: 07/20/2019 19:34   DG HIP OPERATIVE UNILAT W OR W/O PELVIS RIGHT  Result Date: 07/20/2019 CLINICAL DATA:  IM nail right femur. EXAM: DG C-ARM 1-60 MIN; OPERATIVE RIGHT HIP WITH PELVIS FLUOROSCOPY TIME:  Fluoroscopy Time:  1 minutes 21 seconds Radiation Exposure Index (if provided by the fluoroscopic device): 9.02 mGy Number of Acquired Spot Images: 6 COMPARISON:  Preoperative radiograph yesterday. FINDINGS: Six fluoroscopic spot views obtained in the operating room. Intramedullary nail with trans trochanteric and distal locking screws fixate intertrochanteric femur fracture. Fluoroscopy time 1 minutes 21 seconds, dose 9.02 mGy. IMPRESSION: Procedural fluoroscopy during IM nailing of right proximal femur fracture. Electronically Signed   By: Keith Rake M.D.   On: 07/20/2019 19:33   DG Hip Unilat W or Wo Pelvis 2-3 Views Right  Result Date:  07/19/2019 CLINICAL DATA:  86 year old female with right hip pain. EXAM: DG HIP (WITH OR WITHOUT PELVIS) 2-3V RIGHT COMPARISON:  CT abdomen pelvis dated 10/18/2018. FINDINGS: There is a comminuted appearing intertrochanteric fracture of the right femoral neck. There is mild proximal migration of the femoral shaft with mild there is angulation. No dislocation. The bones are osteopenic. The soft tissues are unremarkable. Vascular calcifications noted. IMPRESSION: Comminuted appearing intertrochanteric fracture of the right femoral neck. Electronically Signed   By: Anner Crete M.D.   On: 07/19/2019 18:28    Microbiology: Recent Results (from the past 240 hour(s))  SARS Coronavirus 2 by RT PCR (hospital order, performed in [facility] hospital lab) Nasopharyngeal Nasopharyngeal Swab     Status: None   Collection Time: 07/19/19  5:41 PM   Specimen: Nasopharyngeal Swab  Result Value Ref Range Status   SARS Coronavirus 2 NEGATIVE NEGATIVE Final    Comment: (NOTE) SARS-CoV-2 target nucleic acids are NOT DETECTED.  The SARS-CoV-2 RNA is generally detectable in upper and lower respiratory specimens during the acute phase of infection. The lowest concentration of SARS-CoV-2 viral copies this assay can detect is 250 copies / mL. A negative result does not preclude SARS-CoV-2 infection and should not be used as the sole basis for treatment or other patient management decisions.  A negative result may occur with improper specimen collection / handling, submission of specimen other than nasopharyngeal swab, presence of viral mutation(s) within the areas targeted by this assay, and inadequate number of viral copies (<250 copies / mL). A negative result must be combined with clinical observations, patient history, and epidemiological information.  Fact Sheet for Patients:   StrictlyIdeas.no  Fact Sheet for Healthcare  Providers: BankingDealers.co.za  This test is not yet approved or  cleared by the [place] FDA and has been authorized for detection and/or diagnosis of SARS-CoV-2 by FDA under an Emergency Use Authorization (EUA).  This EUA will remain in effect (meaning this test can be used) for the duration of the COVID-19 declaration under Section 564(b)(1) of the Act, 21 U.S.C. section 360bbb-3(b)(1), unless the authorization is terminated or revoked sooner.  Performed at [facility], [address] [place], [place] [address]   Surgical pcr screen     Status: Abnormal   Collection Time: 07/20/19  2:20 AM   Specimen: Nasal Mucosa; Nasal Swab  Result Value Ref Range Status   MRSA, PCR NEGATIVE NEGATIVE Final   Staphylococcus aureus POSITIVE (A) NEGATIVE Final    Comment: CRITICAL RESULT CALLED TO, READ BACK BY AND VERIFIED WITH: RN Myles Rosenthal [id number] @  [facility] Performed at [facility], [address], [place], [place] [address]      Labs: Basic Metabolic Panel: Recent Labs  Lab 07/20/19 1421 07/21/19 0411 07/22/19 0328 07/23/19 0446 07/24/19 0446  NA 141 137 138 138 135  K 3.9 4.3 4.0 3.9 3.3*  CL 107 103 102 104 100  CO2 24 23 26 23 29   GLUCOSE 111* 316* 195* 193* 254*  BUN 35* 34* 29* 16 12  CREATININE 0.69 0.69 0.71 0.51 0.53  CALCIUM 8.8* 8.7* 8.5* 8.3* 7.9*  MG 1.9 1.9 1.8 1.6* 1.8  PHOS 3.8 4.8* 2.7 1.9* 1.8*   Liver Function Tests: Recent Labs  Lab 07/20/19 1421 07/21/19 0411 07/22/19 0328 07/23/19 0446 07/24/19 0446  AST 11* 13* 13* 19 12*  ALT 12 14 9 7 6   ALKPHOS 45 45 43 46 42  BILITOT 1.3* 1.1 0.8 0.8 0.6  PROT 6.0* 6.1* 5.3* 5.4* 5.5*  ALBUMIN 2.9* 2.8* 2.5* 2.5* 2.3*   No results for input(s): LIPASE, AMYLASE in the last 168 hours. No results for input(s): AMMONIA in the last 168 hours. CBC: Recent Labs  Lab 07/20/19 1421 07/21/19 0411 07/22/19 0328 07/23/19 0446 07/24/19 0446  WBC 7.6 8.1 8.1 8.0 6.1   NEUTROABS 4.6 6.2 5.3 5.4 4.0  HGB 11.4* 10.6* 10.4* 10.5* 9.6*  HCT 34.2* 31.8* 31.2* 32.1* 28.5*  MCV 91.2 91.9 92.0 93.0 92.2  PLT 243 230 200 221 251   Cardiac Enzymes: No results for input(s): CKTOTAL, CKMB, CKMBINDEX, TROPONINI in the last 168 hours. BNP: BNP (last 3 results) No results for input(s): BNP in the last 8760 hours.  ProBNP (last 3 results) No results for input(s): PROBNP in the last 8760 hours.  CBG: Recent Labs  Lab 07/23/19 0804 07/23/19 1322 07/23/19 1800 07/23/19 2103 07/24/19 0805  GLUCAP 174* 189* 147* 166* 215*       Signed:  07/25/19, MD [facility] [phone] pager

## 2019-07-24 NOTE — Plan of Care (Signed)

## 2019-07-24 NOTE — Progress Notes (Signed)
Report called to Princess Perna at [facility] nursing home.

## 2019-07-25 LAB — GLUCOSE, CAPILLARY
Glucose-Capillary: 205 mg/dL — ABNORMAL HIGH (ref 70–99)
Glucose-Capillary: 167 mg/dL — ABNORMAL HIGH (ref 70–99)

## 2019-07-28 DIAGNOSIS — D72829 Elevated white blood cell count, unspecified: Secondary | ICD-10-CM | POA: Diagnosis not present

## 2019-07-28 DIAGNOSIS — I2510 Atherosclerotic heart disease of native coronary artery without angina pectoris: Secondary | ICD-10-CM | POA: Diagnosis present

## 2019-07-28 DIAGNOSIS — I482 Chronic atrial fibrillation, unspecified: Secondary | ICD-10-CM | POA: Diagnosis present

## 2019-07-28 DIAGNOSIS — Z515 Encounter for palliative care: Secondary | ICD-10-CM | POA: Diagnosis not present

## 2019-07-28 DIAGNOSIS — F324 Major depressive disorder, single episode, in partial remission: Secondary | ICD-10-CM | POA: Diagnosis not present

## 2019-07-28 DIAGNOSIS — E860 Dehydration: Secondary | ICD-10-CM | POA: Diagnosis present

## 2019-07-28 DIAGNOSIS — E782 Mixed hyperlipidemia: Secondary | ICD-10-CM | POA: Diagnosis not present

## 2019-07-28 DIAGNOSIS — Z8673 Personal history of transient ischemic attack (TIA), and cerebral infarction without residual deficits: Secondary | ICD-10-CM | POA: Diagnosis not present

## 2019-07-28 DIAGNOSIS — J9601 Acute respiratory failure with hypoxia: Secondary | ICD-10-CM | POA: Diagnosis not present

## 2019-07-28 DIAGNOSIS — Z96641 Presence of right artificial hip joint: Secondary | ICD-10-CM | POA: Diagnosis not present

## 2019-07-28 DIAGNOSIS — R81 Glycosuria: Secondary | ICD-10-CM | POA: Diagnosis present

## 2019-07-28 DIAGNOSIS — E119 Type 2 diabetes mellitus without complications: Secondary | ICD-10-CM | POA: Diagnosis not present

## 2019-07-28 DIAGNOSIS — R6521 Severe sepsis with septic shock: Secondary | ICD-10-CM | POA: Diagnosis present

## 2019-07-28 DIAGNOSIS — B372 Candidiasis of skin and nail: Secondary | ICD-10-CM | POA: Diagnosis not present

## 2019-07-28 DIAGNOSIS — I480 Paroxysmal atrial fibrillation: Secondary | ICD-10-CM | POA: Diagnosis not present

## 2019-07-28 DIAGNOSIS — F419 Anxiety disorder, unspecified: Secondary | ICD-10-CM | POA: Diagnosis present

## 2019-07-28 DIAGNOSIS — J984 Other disorders of lung: Secondary | ICD-10-CM | POA: Diagnosis not present

## 2019-07-28 DIAGNOSIS — E559 Vitamin D deficiency, unspecified: Secondary | ICD-10-CM | POA: Diagnosis not present

## 2019-07-28 DIAGNOSIS — R000 Tachycardia, unspecified: Secondary | ICD-10-CM | POA: Diagnosis not present

## 2019-07-28 DIAGNOSIS — D649 Anemia, unspecified: Secondary | ICD-10-CM | POA: Diagnosis not present

## 2019-07-28 DIAGNOSIS — Z950 Presence of cardiac pacemaker: Secondary | ICD-10-CM | POA: Diagnosis not present

## 2019-07-28 DIAGNOSIS — F0151 Vascular dementia with behavioral disturbance: Secondary | ICD-10-CM | POA: Diagnosis not present

## 2019-07-28 DIAGNOSIS — R809 Proteinuria, unspecified: Secondary | ICD-10-CM | POA: Diagnosis present

## 2019-07-28 DIAGNOSIS — I739 Peripheral vascular disease, unspecified: Secondary | ICD-10-CM | POA: Diagnosis not present

## 2019-07-28 DIAGNOSIS — A419 Sepsis, unspecified organism: Secondary | ICD-10-CM | POA: Diagnosis present

## 2019-07-28 DIAGNOSIS — R404 Transient alteration of awareness: Secondary | ICD-10-CM | POA: Diagnosis not present

## 2019-07-28 DIAGNOSIS — F349 Persistent mood [affective] disorder, unspecified: Secondary | ICD-10-CM | POA: Diagnosis not present

## 2019-07-28 DIAGNOSIS — F0281 Dementia in other diseases classified elsewhere with behavioral disturbance: Secondary | ICD-10-CM | POA: Diagnosis not present

## 2019-07-28 DIAGNOSIS — J9811 Atelectasis: Secondary | ICD-10-CM | POA: Diagnosis not present

## 2019-07-28 DIAGNOSIS — M8588 Other specified disorders of bone density and structure, other site: Secondary | ICD-10-CM | POA: Diagnosis not present

## 2019-07-28 DIAGNOSIS — I4891 Unspecified atrial fibrillation: Secondary | ICD-10-CM | POA: Diagnosis not present

## 2019-07-28 DIAGNOSIS — E870 Hyperosmolality and hypernatremia: Secondary | ICD-10-CM | POA: Diagnosis present

## 2019-07-28 DIAGNOSIS — Z20822 Contact with and (suspected) exposure to covid-19: Secondary | ICD-10-CM | POA: Diagnosis present

## 2019-07-28 DIAGNOSIS — R0902 Hypoxemia: Secondary | ICD-10-CM | POA: Diagnosis not present

## 2019-07-28 DIAGNOSIS — R739 Hyperglycemia, unspecified: Secondary | ICD-10-CM | POA: Diagnosis not present

## 2019-07-28 DIAGNOSIS — R509 Fever, unspecified: Secondary | ICD-10-CM | POA: Diagnosis not present

## 2019-07-28 DIAGNOSIS — E1169 Type 2 diabetes mellitus with other specified complication: Secondary | ICD-10-CM | POA: Diagnosis not present

## 2019-07-28 DIAGNOSIS — E876 Hypokalemia: Secondary | ICD-10-CM | POA: Diagnosis present

## 2019-07-28 DIAGNOSIS — G309 Alzheimer's disease, unspecified: Secondary | ICD-10-CM | POA: Diagnosis present

## 2019-07-28 DIAGNOSIS — G300 Alzheimer's disease with early onset: Secondary | ICD-10-CM | POA: Diagnosis not present

## 2019-07-28 DIAGNOSIS — Z8616 Personal history of COVID-19: Secondary | ICD-10-CM | POA: Diagnosis not present

## 2019-07-28 DIAGNOSIS — I10 Essential (primary) hypertension: Secondary | ICD-10-CM | POA: Diagnosis present

## 2019-07-28 DIAGNOSIS — E440 Moderate protein-calorie malnutrition: Secondary | ICD-10-CM | POA: Diagnosis not present

## 2019-07-28 DIAGNOSIS — F0280 Dementia in other diseases classified elsewhere without behavioral disturbance: Secondary | ICD-10-CM | POA: Diagnosis present

## 2019-07-28 DIAGNOSIS — G4701 Insomnia due to medical condition: Secondary | ICD-10-CM | POA: Diagnosis not present

## 2019-07-28 DIAGNOSIS — K219 Gastro-esophageal reflux disease without esophagitis: Secondary | ICD-10-CM | POA: Diagnosis not present

## 2019-07-28 DIAGNOSIS — Q2546 Tortuous aortic arch: Secondary | ICD-10-CM | POA: Diagnosis not present

## 2019-07-28 DIAGNOSIS — R627 Adult failure to thrive: Secondary | ICD-10-CM | POA: Diagnosis not present

## 2019-07-28 DIAGNOSIS — K922 Gastrointestinal hemorrhage, unspecified: Secondary | ICD-10-CM | POA: Diagnosis not present

## 2019-07-28 DIAGNOSIS — I639 Cerebral infarction, unspecified: Secondary | ICD-10-CM | POA: Diagnosis not present

## 2019-07-28 DIAGNOSIS — Z66 Do not resuscitate: Secondary | ICD-10-CM | POA: Diagnosis present

## 2019-07-28 DIAGNOSIS — R4182 Altered mental status, unspecified: Secondary | ICD-10-CM | POA: Diagnosis not present

## 2019-07-28 DIAGNOSIS — M25551 Pain in right hip: Secondary | ICD-10-CM | POA: Diagnosis not present

## 2019-07-28 DIAGNOSIS — S72141D Displaced intertrochanteric fracture of right femur, subsequent encounter for closed fracture with routine healing: Secondary | ICD-10-CM | POA: Diagnosis not present

## 2019-07-28 DIAGNOSIS — Z79899 Other long term (current) drug therapy: Secondary | ICD-10-CM | POA: Diagnosis not present

## 2019-07-28 DIAGNOSIS — E1165 Type 2 diabetes mellitus with hyperglycemia: Secondary | ICD-10-CM | POA: Diagnosis present

## 2019-07-28 DIAGNOSIS — N179 Acute kidney failure, unspecified: Secondary | ICD-10-CM | POA: Diagnosis present

## 2019-07-28 DIAGNOSIS — F329 Major depressive disorder, single episode, unspecified: Secondary | ICD-10-CM | POA: Diagnosis present

## 2019-07-28 DIAGNOSIS — E785 Hyperlipidemia, unspecified: Secondary | ICD-10-CM | POA: Diagnosis present

## 2019-08-09 DIAGNOSIS — S72141D Displaced intertrochanteric fracture of right femur, subsequent encounter for closed fracture with routine healing: Secondary | ICD-10-CM | POA: Diagnosis not present

## 2019-08-12 DIAGNOSIS — S72141D Displaced intertrochanteric fracture of right femur, subsequent encounter for closed fracture with routine healing: Secondary | ICD-10-CM | POA: Diagnosis not present

## 2019-08-12 DIAGNOSIS — K922 Gastrointestinal hemorrhage, unspecified: Secondary | ICD-10-CM | POA: Diagnosis not present

## 2019-08-12 DIAGNOSIS — I480 Paroxysmal atrial fibrillation: Secondary | ICD-10-CM | POA: Diagnosis not present

## 2019-08-12 DIAGNOSIS — E119 Type 2 diabetes mellitus without complications: Secondary | ICD-10-CM | POA: Diagnosis not present

## 2019-08-12 DIAGNOSIS — I639 Cerebral infarction, unspecified: Secondary | ICD-10-CM | POA: Diagnosis not present

## 2019-09-02 ENCOUNTER — Inpatient Hospital Stay (HOSPITAL_COMMUNITY)
Admission: EM | Admit: 2019-09-02 | Discharge: 2019-09-05 | DRG: 871 | Disposition: A | Discharge status: Hospice | Payer: Medicare Other | Source: Skilled Nursing Facility | Attending: Family Medicine | Admitting: Family Medicine

## 2019-09-02 ENCOUNTER — Encounter (HOSPITAL_COMMUNITY): Payer: Self-pay | Admitting: Emergency Medicine

## 2019-09-02 ENCOUNTER — Emergency Department (HOSPITAL_COMMUNITY): Payer: Medicare Other

## 2019-09-02 ENCOUNTER — Other Ambulatory Visit: Payer: Self-pay

## 2019-09-02 DIAGNOSIS — E785 Hyperlipidemia, unspecified: Secondary | ICD-10-CM | POA: Diagnosis present

## 2019-09-02 DIAGNOSIS — Z66 Do not resuscitate: Secondary | ICD-10-CM | POA: Diagnosis present

## 2019-09-02 DIAGNOSIS — E876 Hypokalemia: Secondary | ICD-10-CM | POA: Diagnosis present

## 2019-09-02 DIAGNOSIS — E870 Hyperosmolality and hypernatremia: Secondary | ICD-10-CM | POA: Diagnosis present

## 2019-09-02 DIAGNOSIS — G309 Alzheimer's disease, unspecified: Secondary | ICD-10-CM | POA: Diagnosis present

## 2019-09-02 DIAGNOSIS — R0902 Hypoxemia: Secondary | ICD-10-CM | POA: Diagnosis not present

## 2019-09-02 DIAGNOSIS — R81 Glycosuria: Secondary | ICD-10-CM | POA: Diagnosis present

## 2019-09-02 DIAGNOSIS — E782 Mixed hyperlipidemia: Secondary | ICD-10-CM | POA: Diagnosis present

## 2019-09-02 DIAGNOSIS — J9601 Acute respiratory failure with hypoxia: Secondary | ICD-10-CM | POA: Diagnosis present

## 2019-09-02 DIAGNOSIS — F0280 Dementia in other diseases classified elsewhere without behavioral disturbance: Secondary | ICD-10-CM | POA: Diagnosis present

## 2019-09-02 DIAGNOSIS — E860 Dehydration: Secondary | ICD-10-CM | POA: Diagnosis present

## 2019-09-02 DIAGNOSIS — Z950 Presence of cardiac pacemaker: Secondary | ICD-10-CM | POA: Diagnosis not present

## 2019-09-02 DIAGNOSIS — Z808 Family history of malignant neoplasm of other organs or systems: Secondary | ICD-10-CM

## 2019-09-02 DIAGNOSIS — F329 Major depressive disorder, single episode, unspecified: Secondary | ICD-10-CM | POA: Diagnosis present

## 2019-09-02 DIAGNOSIS — J9811 Atelectasis: Secondary | ICD-10-CM | POA: Diagnosis not present

## 2019-09-02 DIAGNOSIS — Z79891 Long term (current) use of opiate analgesic: Secondary | ICD-10-CM

## 2019-09-02 DIAGNOSIS — Z90710 Acquired absence of both cervix and uterus: Secondary | ICD-10-CM

## 2019-09-02 DIAGNOSIS — R6521 Severe sepsis with septic shock: Secondary | ICD-10-CM | POA: Diagnosis present

## 2019-09-02 DIAGNOSIS — R739 Hyperglycemia, unspecified: Secondary | ICD-10-CM | POA: Diagnosis not present

## 2019-09-02 DIAGNOSIS — F0281 Dementia in other diseases classified elsewhere with behavioral disturbance: Secondary | ICD-10-CM | POA: Diagnosis not present

## 2019-09-02 DIAGNOSIS — R809 Proteinuria, unspecified: Secondary | ICD-10-CM | POA: Diagnosis present

## 2019-09-02 DIAGNOSIS — Z8249 Family history of ischemic heart disease and other diseases of the circulatory system: Secondary | ICD-10-CM

## 2019-09-02 DIAGNOSIS — G300 Alzheimer's disease with early onset: Secondary | ICD-10-CM | POA: Diagnosis not present

## 2019-09-02 DIAGNOSIS — F419 Anxiety disorder, unspecified: Secondary | ICD-10-CM | POA: Diagnosis present

## 2019-09-02 DIAGNOSIS — E1165 Type 2 diabetes mellitus with hyperglycemia: Secondary | ICD-10-CM | POA: Diagnosis present

## 2019-09-02 DIAGNOSIS — F0151 Vascular dementia with behavioral disturbance: Secondary | ICD-10-CM | POA: Diagnosis not present

## 2019-09-02 DIAGNOSIS — A419 Sepsis, unspecified organism: Principal | ICD-10-CM | POA: Diagnosis present

## 2019-09-02 DIAGNOSIS — Z515 Encounter for palliative care: Secondary | ICD-10-CM | POA: Diagnosis not present

## 2019-09-02 DIAGNOSIS — R509 Fever, unspecified: Secondary | ICD-10-CM | POA: Diagnosis not present

## 2019-09-02 DIAGNOSIS — D72829 Elevated white blood cell count, unspecified: Secondary | ICD-10-CM | POA: Diagnosis not present

## 2019-09-02 DIAGNOSIS — R404 Transient alteration of awareness: Secondary | ICD-10-CM | POA: Diagnosis not present

## 2019-09-02 DIAGNOSIS — N179 Acute kidney failure, unspecified: Secondary | ICD-10-CM | POA: Diagnosis present

## 2019-09-02 DIAGNOSIS — Z794 Long term (current) use of insulin: Secondary | ICD-10-CM

## 2019-09-02 DIAGNOSIS — I482 Chronic atrial fibrillation, unspecified: Secondary | ICD-10-CM | POA: Diagnosis present

## 2019-09-02 DIAGNOSIS — Z7982 Long term (current) use of aspirin: Secondary | ICD-10-CM

## 2019-09-02 DIAGNOSIS — R54 Age-related physical debility: Secondary | ICD-10-CM | POA: Diagnosis present

## 2019-09-02 DIAGNOSIS — I2510 Atherosclerotic heart disease of native coronary artery without angina pectoris: Secondary | ICD-10-CM | POA: Diagnosis present

## 2019-09-02 DIAGNOSIS — Z90722 Acquired absence of ovaries, bilateral: Secondary | ICD-10-CM

## 2019-09-02 DIAGNOSIS — Z951 Presence of aortocoronary bypass graft: Secondary | ICD-10-CM

## 2019-09-02 DIAGNOSIS — Z8673 Personal history of transient ischemic attack (TIA), and cerebral infarction without residual deficits: Secondary | ICD-10-CM | POA: Diagnosis not present

## 2019-09-02 DIAGNOSIS — Z96641 Presence of right artificial hip joint: Secondary | ICD-10-CM | POA: Diagnosis not present

## 2019-09-02 DIAGNOSIS — I4891 Unspecified atrial fibrillation: Secondary | ICD-10-CM | POA: Diagnosis not present

## 2019-09-02 DIAGNOSIS — I252 Old myocardial infarction: Secondary | ICD-10-CM

## 2019-09-02 DIAGNOSIS — R627 Adult failure to thrive: Secondary | ICD-10-CM | POA: Diagnosis not present

## 2019-09-02 DIAGNOSIS — I639 Cerebral infarction, unspecified: Secondary | ICD-10-CM | POA: Diagnosis not present

## 2019-09-02 DIAGNOSIS — Z87891 Personal history of nicotine dependence: Secondary | ICD-10-CM

## 2019-09-02 DIAGNOSIS — Z96649 Presence of unspecified artificial hip joint: Secondary | ICD-10-CM | POA: Diagnosis present

## 2019-09-02 DIAGNOSIS — Z8616 Personal history of COVID-19: Secondary | ICD-10-CM | POA: Diagnosis not present

## 2019-09-02 DIAGNOSIS — Z833 Family history of diabetes mellitus: Secondary | ICD-10-CM

## 2019-09-02 DIAGNOSIS — R4182 Altered mental status, unspecified: Secondary | ICD-10-CM | POA: Diagnosis not present

## 2019-09-02 DIAGNOSIS — I495 Sick sinus syndrome: Secondary | ICD-10-CM | POA: Diagnosis present

## 2019-09-02 DIAGNOSIS — Z20822 Contact with and (suspected) exposure to covid-19: Secondary | ICD-10-CM | POA: Diagnosis present

## 2019-09-02 DIAGNOSIS — I10 Essential (primary) hypertension: Secondary | ICD-10-CM | POA: Diagnosis present

## 2019-09-02 DIAGNOSIS — R000 Tachycardia, unspecified: Secondary | ICD-10-CM | POA: Diagnosis not present

## 2019-09-02 DIAGNOSIS — Z79899 Other long term (current) drug therapy: Secondary | ICD-10-CM

## 2019-09-02 DIAGNOSIS — M25551 Pain in right hip: Secondary | ICD-10-CM | POA: Diagnosis not present

## 2019-09-02 DIAGNOSIS — Q2546 Tortuous aortic arch: Secondary | ICD-10-CM | POA: Diagnosis not present

## 2019-09-02 DIAGNOSIS — J984 Other disorders of lung: Secondary | ICD-10-CM | POA: Diagnosis not present

## 2019-09-02 HISTORY — DX: Cerebral infarction, unspecified: I63.9

## 2019-09-02 LAB — SARS CORONAVIRUS 2 BY RT PCR (HOSPITAL ORDER, PERFORMED IN ~~LOC~~ HOSPITAL LAB): SARS Coronavirus 2: NEGATIVE

## 2019-09-02 LAB — CBC WITH DIFFERENTIAL/PLATELET
Abs Immature Granulocytes: 0.1 10*3/uL — ABNORMAL HIGH (ref 0.00–0.07)
Basophils Absolute: 0.1 10*3/uL (ref 0.0–0.1)
Basophils Relative: 0 %
Eosinophils Absolute: 0 10*3/uL (ref 0.0–0.5)
Eosinophils Relative: 0 %
HCT: 52.4 % — ABNORMAL HIGH (ref 36.0–46.0)
Hemoglobin: 16.1 g/dL — ABNORMAL HIGH (ref 12.0–15.0)
Immature Granulocytes: 1 %
Lymphocytes Relative: 6 %
Lymphs Abs: 1.3 10*3/uL (ref 0.7–4.0)
MCH: 30.6 pg (ref 26.0–34.0)
MCHC: 30.7 g/dL (ref 30.0–36.0)
MCV: 99.6 fL (ref 80.0–100.0)
Monocytes Absolute: 1.7 10*3/uL — ABNORMAL HIGH (ref 0.1–1.0)
Monocytes Relative: 8 %
Neutro Abs: 18.1 10*3/uL — ABNORMAL HIGH (ref 1.7–7.7)
Neutrophils Relative %: 85 %
Platelets: 214 10*3/uL (ref 150–400)
RBC: 5.26 MIL/uL — ABNORMAL HIGH (ref 3.87–5.11)
RDW: 14 % (ref 11.5–15.5)
WBC: 21.3 10*3/uL — ABNORMAL HIGH (ref 4.0–10.5)
nRBC: 0 % (ref 0.0–0.2)

## 2019-09-02 LAB — COMPREHENSIVE METABOLIC PANEL
ALT: 17 U/L (ref 0–44)
AST: 15 U/L (ref 15–41)
Albumin: 3.9 g/dL (ref 3.5–5.0)
Alkaline Phosphatase: 73 U/L (ref 38–126)
Anion gap: 12 (ref 5–15)
BUN: 60 mg/dL — ABNORMAL HIGH (ref 8–23)
CO2: 27 mmol/L (ref 22–32)
Calcium: 9.7 mg/dL (ref 8.9–10.3)
Chloride: 121 mmol/L — ABNORMAL HIGH (ref 98–111)
Creatinine, Ser: 1.19 mg/dL — ABNORMAL HIGH (ref 0.44–1.00)
GFR calc Af Amer: 48 mL/min — ABNORMAL LOW (ref >60)
GFR calc non Af Amer: 41 mL/min — ABNORMAL LOW (ref >60)
Glucose, Bld: 322 mg/dL — ABNORMAL HIGH (ref 70–99)
Potassium: 3.1 mmol/L — ABNORMAL LOW (ref 3.5–5.1)
Sodium: 160 mmol/L — ABNORMAL HIGH (ref 135–145)
Total Bilirubin: 0.5 mg/dL (ref 0.3–1.2)
Total Protein: 7.8 g/dL (ref 6.5–8.1)

## 2019-09-02 LAB — URINALYSIS, ROUTINE W REFLEX MICROSCOPIC
Bilirubin Urine: NEGATIVE
Glucose, UA: >=500 mg/dL — AB
Ketones, ur: NEGATIVE mg/dL
Nitrite: NEGATIVE
Protein, ur: 30 mg/dL — AB
Specific Gravity, Urine: 1.03 (ref 1.005–1.030)
pH: 5 (ref 5.0–8.0)

## 2019-09-02 LAB — APTT: aPTT: 31 seconds (ref 24–36)

## 2019-09-02 LAB — PROTIME-INR
INR: 1.2 (ref 0.8–1.2)
Prothrombin Time: 14.7 seconds (ref 11.4–15.2)

## 2019-09-02 LAB — LACTIC ACID, PLASMA: Lactic Acid, Venous: 2.8 mmol/L (ref 0.5–1.9)

## 2019-09-02 MED ORDER — SODIUM CHLORIDE 0.9 % IV SOLN
INTRAVENOUS | Status: DC
  Administered 2019-09-02: 500 mL via INTRAVENOUS

## 2019-09-02 MED ORDER — METRONIDAZOLE IN NACL 5-0.79 MG/ML-% IV SOLN
500.0000 mg | Freq: Once | INTRAVENOUS | Status: AC
  Administered 2019-09-02: 500 mg via INTRAVENOUS
  Filled 2019-09-02: qty 100

## 2019-09-02 MED ORDER — SODIUM CHLORIDE 0.9 % IV SOLN
2.0000 g | Freq: Once | INTRAVENOUS | Status: AC
  Administered 2019-09-02: 2 g via INTRAVENOUS
  Filled 2019-09-02: qty 2

## 2019-09-02 MED ORDER — VANCOMYCIN HCL IN DEXTROSE 1-5 GM/200ML-% IV SOLN: 1000.0000 mg | INTRAVENOUS | Status: DC

## 2019-09-02 MED ORDER — POTASSIUM CHLORIDE IN NACL 20-0.45 MEQ/L-% IV SOLN
INTRAVENOUS | Status: DC
  Filled 2019-09-02 (×17): qty 1000
  Filled 2019-09-02: qty 2000
  Filled 2019-09-02 (×24): qty 1000

## 2019-09-02 MED ORDER — VANCOMYCIN HCL IN DEXTROSE 1-5 GM/200ML-% IV SOLN
1000.0000 mg | Freq: Once | INTRAVENOUS | Status: AC
  Administered 2019-09-02: 1000 mg via INTRAVENOUS
  Filled 2019-09-02: qty 200

## 2019-09-02 MED ORDER — SODIUM CHLORIDE 0.9 % IV SOLN
2.0000 g | Freq: Two times a day (BID) | INTRAVENOUS | Status: DC
  Administered 2019-09-03: 2 g via INTRAVENOUS
  Filled 2019-09-02: qty 2

## 2019-09-02 MED ORDER — SODIUM CHLORIDE 0.9 % IV BOLUS
500.0000 mL | Freq: Once | INTRAVENOUS | Status: AC
  Administered 2019-09-02: 500 mL via INTRAVENOUS

## 2019-09-02 NOTE — ED Provider Notes (Signed)
[facility] EMERGENCY DEPARTMENT Provider Note   CSN: [id number] Arrival date & time: 09/02/19  2142     History Chief Complaint  Patient presents with  . Code Sepsis    Valerie Carr is a 86 y.o. female.  Patient brought in from [facility] nursing facility.  Patient was reportedly normal earlier today.  Patient is a DNR.  Records show that she has been a DNR back to September 2020.  Patient recently had some hip surgery and looks like may have had CABG surgery not too long ago.  Patient brought in by EMS for severe respiratory distress.  Oxygen saturations 100% nonrebreather 83%.  On arrival here patient also was febrile tachycardic.  Has a history of atrial fib.  Cardiac monitoring and EKG suggestive of atrial fib.  Did contact the patient's daughter.  Did confirm that she is a DNR.  Patient would normally needed intubation and placed on ventilator but since she was a DNR was started on BiPAP.  Code sepsis was initiated.  Patient able to talk some.  But in respiratory distress.  Denied any pain.        Past Medical History:  Diagnosis Date  . Arteriosclerotic cardiovascular disease (ASCVD)   . Chronic anxiety    and depression  . Dementia (HCC)   . DM type 2 (diabetes mellitus, type 2) (HCC)   . Gallstones    asysmptomatic  . HTN (hypertension)   . Hyperlipidemia   . Sick sinus syndrome (HCC)    s/p PPM  . Stroke [facility])     Patient Active Problem List   Diagnosis Date Noted  . Hypernatremia 09/02/2019  . Candidiasis of breast 07/21/2019  . Closed fracture of neck of right femur (HCC) 07/19/2019  . Hypokalemia 07/19/2019  . Heme positive stool   . Hyperglycemia   . COVID-19 virus infection   . Rectal bleeding 10/18/2018  . AAA (abdominal aortic aneurysm) (HCC) 10/18/2018  . Dementia with behavioral disturbance (HCC)   . Type 2 diabetes mellitus (HCC)   . Acute lower UTI 09/04/2016  . Acute cystitis without hematuria   . AKI (acute kidney injury) (HCC)  09/03/2016  . Chronic atrial fibrillation (HCC) 05/31/2014  . Cardiac pacemaker in situ 05/31/2014  . Coronary artery disease involving coronary bypass graft of native heart without angina pectoris 05/31/2014  . Carotid artery stenosis, asymptomatic 05/31/2014  . Sick sinus syndrome (HCC) 04/18/2013  . Coronary atherosclerosis of native coronary artery 01/11/2013  . Essential hypertension, benign 01/11/2013  . Mixed hyperlipidemia 01/11/2013  . Alzheimer's disease (HCC) 09/02/2012  . Bradycardia 01/06/2011    Past Surgical History:  Procedure Laterality Date  . ABDOMINAL SURGERY     12 inches taken out of "rotted bowel"  . APPENDECTOMY    . CORONARY ARTERY BYPASS GRAFT     07-11-2005, triple bypass  . ESOPHAGOGASTRODUODENOSCOPY    . INTRAMEDULLARY (IM) NAIL INTERTROCHANTERIC Right 07/20/2019   Procedure: INTRAMEDULLARY (IM) NAIL INTERTROCHANTRIC;  Surgeon: Roby Lofts, MD;  Location: [facility] OR;  Service: Orthopedics;  Laterality: Right;  . LAPAROTOMY N/A 04/20/2016   Procedure: EXPLORATORY LAPAROTOMY, SMALL BOWEL RESECTION, LYSIS OF ADHESIONS;  Surgeon: Darnell Level, MD;  Location: [facility] ORS;  Service: General;  Laterality: N/A;  . PACEMAKER INSERTION  2004   [employer] PPM implanted by Dr Amil Amen, generator change by Dr Johney Frame 01/07/11  . PERMANENT PACEMAKER GENERATOR CHANGE N/A 01/07/2011   Procedure: PERMANENT PACEMAKER GENERATOR CHANGE;  Surgeon: Hillis Range, MD;  Location: [facility]  CATH LAB;  Service: Cardiovascular;  Laterality: N/A;  . TONSILLECTOMY    . TOTAL ABDOMINAL HYSTERECTOMY W/ BILATERAL SALPINGOOPHORECTOMY       OB History   No obstetric history on file.     Family History  Problem Relation Age of Onset  . Diabetes Mother   . Hypertension Mother   . Coronary artery disease Father   . Diabetes Brother   . Throat cancer Brother   . Diabetes Brother   . Hypertension Sister   . Cancer Son        esophageal    Social History   Tobacco Use  . Smoking status:  Former Smoker    Packs/day: 1.00    Years: 25.00    Pack years: 25.00    Quit date: 2001    Years since quitting: 20.6  . Smokeless tobacco: Former Clinical biochemist  . Vaping Use: Never used  Substance Use Topics  . Alcohol use: No  . Drug use: No    Home Medications Prior to Admission medications   Medication Sig Start Date End Date Taking? Authorizing Provider  aspirin 325 MG tablet Take 325 mg by mouth daily.   Yes [provider]  atorvastatin (LIPITOR) 20 MG tablet Take 1 tablet (20 mg total) by mouth daily. 07/25/19  Yes Drema Dallas, MD  cefTRIAXone (ROCEPHIN) 1 g injection Inject 1 g into the muscle daily. Starting 09/02/19 x 5 days.   Yes [provider]  Cholecalciferol (VITAMIN D3) 50 MCG (2000 UT) TABS Take 2,000 Units by mouth every morning.   Yes [provider]  docusate sodium (COLACE) 100 MG capsule Take 1 capsule (100 mg total) by mouth 2 (two) times daily. 07/24/19  Yes Drema Dallas, MD  DULoxetine (CYMBALTA) 60 MG capsule Take 60 mg by mouth every morning. For depression   Yes [provider]  HYDROcodone-acetaminophen (NORCO/VICODIN) 5-325 MG tablet Take 0.5 tablets by mouth in the morning, at noon, and at bedtime.   Yes [provider]  insulin detemir (LEVEMIR) 100 UNIT/ML injection Inject 15 Units into the skin at bedtime.   Yes [provider]  insulin lispro (HUMALOG) 100 UNIT/ML injection Inject 8 Units into the skin 3 (three) times daily before meals.   Yes [provider]  magnesium oxide (MAG-OX) 400 (241.3 Mg) MG tablet Take 1 tablet (400 mg total) by mouth daily. 07/25/19  Yes Drema Dallas, MD  methocarbamol (ROBAXIN) 500 MG tablet Take 1 tablet (500 mg total) by mouth every 6 (six) hours as needed for muscle spasms. 07/24/19  Yes Drema Dallas, MD  Multiple Vitamin (MULTIVITAMIN WITH MINERALS) TABS tablet Take 1 tablet by mouth daily. 07/25/19  Yes Drema Dallas, MD  pantoprazole sodium  (PROTONIX) 40 mg/20 mL PACK Take 40 mg by mouth daily.   Yes [provider]  senna-docusate (SENOKOT-S) 8.6-50 MG tablet Take 1 tablet by mouth 2 (two) times daily. 07/24/19  Yes Drema Dallas, MD  traZODone (DESYREL) 50 MG tablet Take 50 mg by mouth at bedtime.    Yes [provider]  acetaminophen (TYLENOL) 325 MG tablet Take 1-2 tablets (325-650 mg total) by mouth every 6 (six) hours as needed for mild pain (pain score 1-3 or temp > 100.5). 07/21/19   Haddix, Gillie Manners, MD  divalproex (DEPAKOTE SPRINKLE) 125 MG capsule Take 2 capsules (250 mg total) by mouth 4 (four) times daily. 07/24/19   Drema Dallas, MD  ibuprofen (ADVIL)  600 MG tablet Take 1 tablet (600 mg total) by mouth every 6 (six) hours as needed for moderate pain. 07/21/19   Haddix, Gillie Manners, MD  insulin aspart (NOVOLOG) 100 UNIT/ML injection Inject 8 Units into the skin 3 (three) times daily before meals. 07/24/19   Drema Dallas, MD  nystatin (MYCOSTATIN/NYSTOP) powder Apply topically 2 (two) times daily. 07/24/19   Drema Dallas, MD  ondansetron (ZOFRAN) 4 MG tablet Take 1 tablet (4 mg total) by mouth every 6 (six) hours as needed for nausea. 07/24/19   Drema Dallas, MD  pantoprazole (PROTONIX) 40 MG tablet Take 1 tablet (40 mg total) by mouth daily. 07/25/19   Drema Dallas, MD    Allergies    Clarithromycin, Codeine phosphate, Effexor [venlafaxine hydrochloride], Fexofenadine hcl, Lisinopril, Prozac [fluoxetine hcl], Septra [bactrim], and Wellbutrin [bupropion hcl]  Review of Systems   Review of Systems  Unable to perform ROS: Severe respiratory distress    Physical Exam Updated Vital Signs BP (!) 120/49   Pulse 64   Temp (!) 102.3 F (39.1 C) (Rectal)   Resp (!) 29   Ht 1.727 m (5\' 8" )   Wt 60 kg   SpO2 100%   BMI 20.11 kg/m   Physical Exam Constitutional:      General: She is in acute distress.     Appearance: She is ill-appearing and toxic-appearing.     Comments: Patient awake but in  significant respiratory distress.  Eyes:     Pupils: Pupils are equal, round, and reactive to light.  Cardiovascular:     Rate and Rhythm: Tachycardia present. Rhythm irregular.  Pulmonary:     Effort: Respiratory distress present.  Abdominal:     General: There is distension.     Tenderness: There is abdominal tenderness.  Musculoskeletal:        General: Swelling present.  Neurological:     Comments: Patient in respiratory distress.  But some movement of all 4 extremities.  She is able to verbalize some.  Denied any pain.     ED Results / Procedures / Treatments   Labs (all labs ordered are listed, but only abnormal results are displayed) Labs Reviewed  CBC WITH DIFFERENTIAL/PLATELET - Abnormal; Notable for the following components:      Result Value   WBC 21.3 (*)    RBC 5.26 (*)    Hemoglobin 16.1 (*)    HCT 52.4 (*)    Neutro Abs 18.1 (*)    Monocytes Absolute 1.7 (*)    Abs Immature Granulocytes 0.10 (*)    All other components within normal limits  COMPREHENSIVE METABOLIC PANEL - Abnormal; Notable for the following components:   Sodium 160 (*)    Potassium 3.1 (*)    Chloride 121 (*)    Glucose, Bld 322 (*)    BUN 60 (*)    Creatinine, Ser 1.19 (*)    GFR calc non Af Amer 41 (*)    GFR calc Af Amer 48 (*)    All other components within normal limits  LACTIC ACID, PLASMA - Abnormal; Notable for the following components:   Lactic Acid, Venous 2.8 (*)    All other components within normal limits  URINALYSIS, ROUTINE W REFLEX MICROSCOPIC - Abnormal; Notable for the following components:   APPearance CLOUDY (*)    Glucose, UA >=500 (*)    Hgb urine dipstick SMALL (*)    Protein, ur 30 (*)    Leukocytes,Ua TRACE (*)  Bacteria, UA MANY (*)    All other components within normal limits  CULTURE, BLOOD (ROUTINE X 2)  CULTURE, BLOOD (ROUTINE X 2)  SARS CORONAVIRUS 2 BY RT PCR (HOSPITAL ORDER, PERFORMED IN [facility])  URINE CULTURE  PROTIME-INR    APTT  BASIC METABOLIC PANEL  BASIC METABOLIC PANEL  BASIC METABOLIC PANEL  BASIC METABOLIC PANEL  BASIC METABOLIC PANEL  LACTIC ACID, PLASMA  BASIC METABOLIC PANEL    EKG EKG Interpretation  Date/Time:  Friday September 02 2019 21:46:13 EDT Ventricular Rate:  144 PR Interval:    QRS Duration: 86 QT Interval:  270 QTC Calculation: 442 R Axis:   -78 Text Interpretation: Atrial fibrillation with rapid V-rate Inferior infarct, old Probable anterior infarct, age indeterminate Confirmed by Vanetta MuldersZackowski, Oakes Mccready [phone]([id number]) on 09/02/2019 10:08:23 PM   Radiology DG Chest Port 1 View  Result Date: 09/02/2019 CLINICAL DATA:  Shortness of breath, altered mental status EXAM: PORTABLE CHEST 1 VIEW COMPARISON:  Radiograph 07/19/2019 FINDINGS: Chronic hyperinflation and coarsened interstitial changes present throughout both lungs with superimposed areas of patchy consolidative opacity throughout the right hemithorax and perhaps minimally in the left lung base left upper lobe as well. The aorta is calcified. The remaining cardiomediastinal contours are unremarkable. Postsurgical changes related to prior CABG including intact and aligned sternotomy wires and multiple surgical clips projecting over the mediastinum. Pacer pack overlies the left chest wall with leads at the right atrium and cardiac apex. The osseous structures appear diffusely demineralized which may limit detection of small or nondisplaced fractures. No acute osseous abnormality or suspicious osseous lesion. Degenerative changes are present in the imaged spine and shoulders. Telemetry leads overlie the chest. IMPRESSION: Chronic hyperinflation and coarsened interstitial changes throughout both lungs Superimposed patchy consolidative opacity throughout the right hemithorax and within the left apex and lung base. Worrisome for a multifocal infectious process. Electronically Signed   By: Kreg ShropshirePrice  DeHay M.D.   On: 09/02/2019 22:32    Procedures Procedures  (including critical care time)  CRITICAL CARE Performed by: Vanetta MuldersScott Janeese Mcgloin Total critical care time: 60 minutes Critical care time was exclusive of separately billable procedures and treating other patients. Critical care was necessary to treat or prevent imminent or life-threatening deterioration. Critical care was time spent personally by me on the following activities: development of treatment plan with patient and/or surrogate as well as nursing, discussions with consultants, evaluation of patient's response to treatment, examination of patient, obtaining history from patient or surrogate, ordering and performing treatments and interventions, ordering and review of laboratory studies, ordering and review of radiographic studies, pulse oximetry and re-evaluation of patient's condition.   Medications Ordered in ED Medications  0.9 %  sodium chloride infusion (500 mLs Intravenous New Bag/Given 09/02/19 2330)  ceFEPIme (MAXIPIME) 2 g in sodium chloride 0.9 % 100 mL IVPB (has no administration in time range)  vancomycin (VANCOCIN) IVPB 1000 mg/200 mL premix (has no administration in time range)  0.45 % NaCl with KCl 20 mEq / L infusion (has no administration in time range)  sodium chloride 0.9 % bolus 500 mL (0 mLs Intravenous Stopped 09/02/19 2303)  ceFEPIme (MAXIPIME) 2 g in sodium chloride 0.9 % 100 mL IVPB (0 g Intravenous Stopped 09/02/19 2318)  metroNIDAZOLE (FLAGYL) IVPB 500 mg (0 mg Intravenous Stopped 09/02/19 2323)  vancomycin (VANCOCIN) IVPB 1000 mg/200 mL premix (0 mg Intravenous Stopped 09/02/19 2323)    ED Course  I have reviewed the triage vital signs and the nursing notes.  Pertinent labs &  imaging results that were available during my care of the patient were reviewed by me and considered in my medical decision making (see chart for details).    MDM Rules/Calculators/A&P                          Patient in significant respiratory distress.  Started on BiPAP which actually helped  a lot.  Patient's temperature was elevated.  Urine appeared very cloudy.  But urinalysis nitrite negative.  Heart rate 140s atrial fibrillation.  But after being placed on BiPAP and's some initial fluids given patient's heart rate came down to the 120s.  Patient has a history of atrial fib.  Does not appear to be on blood thinners.  Patient's lactic acid was in the 2 range less than 4.  White blood cell count significantly elevated 21,000.  Covid testing is pending.  It is a DNR most likely pneumonia as the cause of infection.  Urinalysis may be contributing.  Patient with respiratory failure which was improved significantly by BiPAP.  Gust with hospitalist who will admit.     Final Clinical Impression(s) / ED Diagnoses Final diagnoses:  Sepsis, due to unspecified organism, unspecified whether acute organ dysfunction present [facility])  Acute respiratory failure with hypoxia [facility])    Rx / DC Orders ED Discharge Orders    None       Vanetta Mulders, MD 09/03/19 0005

## 2019-09-02 NOTE — ED Notes (Signed)
Date and time results received: 09/02/19 2250 (use smartphrase ".now" to insert current time)  Test: Lactic Acid Critical Value: 2.8  Name of Provider Notified: Dr Deretha [facility]  Orders Received? Or Actions Taken?:

## 2019-09-02 NOTE — Progress Notes (Signed)
Pharmacy Antibiotic Note  Valerie Carr is a 86 y.o. female admitted on 09/02/2019 with sepsis.  Pharmacy has been consulted for Vancomycin and Cefepime dosing.  Plan: Cefepime 2gm IV q12h Vancomycin 1000 mg IV Q 24 hrs.  Will f/u renal function, micro data, and pt's clinical condition Vanc levels prn   Height: 5\' 8"  (172.7 cm) Weight: 60 kg (132 lb 4.4 oz) IBW/kg (Calculated) : 63.9  Temp (24hrs), Avg:102.3 F (39.1 C), Min:102.3 F (39.1 C), Max:102.3 F (39.1 C)  Recent Labs  Lab 09/02/19 2155  WBC 21.3*  CREATININE 1.19*    Estimated Creatinine Clearance: 31.5 mL/min (A) (by C-G formula based on SCr of 1.19 mg/dL (H)).    Allergies  Allergen Reactions  . Clarithromycin Other (See Comments)    unknown  . Codeine Phosphate Other (See Comments)    unknown  . Effexor [Venlafaxine Hydrochloride] Other (See Comments)    unknown  . Fexofenadine Hcl Other (See Comments)    unknown  . Lisinopril Other (See Comments)    unknown  . Prozac [Fluoxetine Hcl] Other (See Comments)    unknown  . Septra [Bactrim] Other (See Comments)    unknown  . Wellbutrin [Bupropion Hcl] Other (See Comments)    unknown    Antimicrobials this admission: 8/6 Vanc >>  8/6 Cefepime >>  8/6 Flagyl x1  Microbiology results: 8/6 BCx:  8/6 UCx:    Thank you for allowing pharmacy to be a part of this patient's care.  10/6, PharmD, BCPS Please see amion for complete clinical pharmacist phone list 09/02/2019 10:37 PM

## 2019-09-02 NOTE — ED Triage Notes (Signed)
EMS called to [facility] for AMS and decreased appetite. Per EMS arrival; pt was febrile, tachycardic (120-130), and hypoxic (sats 80% on R/A). EMS reported pt with hx of hip replacement (54mo ago) and was told pt had a screw loose that would eventually "lead to an infection". Pt given SQ fluids by facility and 1gm Rocephin IM.

## 2019-09-03 DIAGNOSIS — N179 Acute kidney failure, unspecified: Secondary | ICD-10-CM

## 2019-09-03 DIAGNOSIS — G300 Alzheimer's disease with early onset: Secondary | ICD-10-CM

## 2019-09-03 DIAGNOSIS — F0281 Dementia in other diseases classified elsewhere with behavioral disturbance: Secondary | ICD-10-CM

## 2019-09-03 DIAGNOSIS — A419 Sepsis, unspecified organism: Secondary | ICD-10-CM | POA: Diagnosis present

## 2019-09-03 DIAGNOSIS — J9601 Acute respiratory failure with hypoxia: Secondary | ICD-10-CM

## 2019-09-03 DIAGNOSIS — E870 Hyperosmolality and hypernatremia: Secondary | ICD-10-CM

## 2019-09-03 LAB — CBC WITH DIFFERENTIAL/PLATELET
Abs Immature Granulocytes: 0.1 10*3/uL — ABNORMAL HIGH (ref 0.00–0.07)
Band Neutrophils: 5 %
Basophils Absolute: 0 10*3/uL (ref 0.0–0.1)
Basophils Relative: 0 %
Eosinophils Absolute: 0 10*3/uL (ref 0.0–0.5)
Eosinophils Relative: 0 %
HCT: 44.6 % (ref 36.0–46.0)
Hemoglobin: 13.1 g/dL (ref 12.0–15.0)
Lymphocytes Relative: 15 %
Lymphs Abs: 2.9 10*3/uL (ref 0.7–4.0)
MCH: 30.5 pg (ref 26.0–34.0)
MCHC: 29.4 g/dL — ABNORMAL LOW (ref 30.0–36.0)
MCV: 103.7 fL — ABNORMAL HIGH (ref 80.0–100.0)
Monocytes Absolute: 0.4 10*3/uL (ref 0.1–1.0)
Monocytes Relative: 2 %
Neutro Abs: 15.8 10*3/uL — ABNORMAL HIGH (ref 1.7–7.7)
Neutrophils Relative %: 78 %
Platelets: 137 10*3/uL — ABNORMAL LOW (ref 150–400)
RBC: 4.3 MIL/uL (ref 3.87–5.11)
RDW: 14.2 % (ref 11.5–15.5)
WBC: 19 10*3/uL — ABNORMAL HIGH (ref 4.0–10.5)
nRBC: 0 % (ref 0.0–0.2)

## 2019-09-03 LAB — BASIC METABOLIC PANEL
Anion gap: 8 (ref 5–15)
Anion gap: 13 (ref 5–15)
Anion gap: 11 (ref 5–15)
Anion gap: 8 (ref 5–15)
BUN: 61 mg/dL — ABNORMAL HIGH (ref 8–23)
BUN: 56 mg/dL — ABNORMAL HIGH (ref 8–23)
BUN: 62 mg/dL — ABNORMAL HIGH (ref 8–23)
BUN: 54 mg/dL — ABNORMAL HIGH (ref 8–23)
CO2: 26 mmol/L (ref 22–32)
CO2: 18 mmol/L — ABNORMAL LOW (ref 22–32)
CO2: 22 mmol/L (ref 22–32)
CO2: 23 mmol/L (ref 22–32)
Calcium: 8.7 mg/dL — ABNORMAL LOW (ref 8.9–10.3)
Calcium: 7.9 mg/dL — ABNORMAL LOW (ref 8.9–10.3)
Calcium: 8.3 mg/dL — ABNORMAL LOW (ref 8.9–10.3)
Calcium: 8 mg/dL — ABNORMAL LOW (ref 8.9–10.3)
Chloride: 122 mmol/L — ABNORMAL HIGH (ref 98–111)
Chloride: 125 mmol/L — ABNORMAL HIGH (ref 98–111)
Chloride: 123 mmol/L — ABNORMAL HIGH (ref 98–111)
Chloride: 118 mmol/L — ABNORMAL HIGH (ref 98–111)
Creatinine, Ser: 1.6 mg/dL — ABNORMAL HIGH (ref 0.44–1.00)
Creatinine, Ser: 1.54 mg/dL — ABNORMAL HIGH (ref 0.44–1.00)
Creatinine, Ser: 1.77 mg/dL — ABNORMAL HIGH (ref 0.44–1.00)
Creatinine, Ser: 1.36 mg/dL — ABNORMAL HIGH (ref 0.44–1.00)
GFR calc Af Amer: 33 mL/min — ABNORMAL LOW (ref >60)
GFR calc Af Amer: 35 mL/min — ABNORMAL LOW (ref >60)
GFR calc Af Amer: 29 mL/min — ABNORMAL LOW (ref >60)
GFR calc Af Amer: 40 mL/min — ABNORMAL LOW (ref >60)
GFR calc non Af Amer: 29 mL/min — ABNORMAL LOW (ref >60)
GFR calc non Af Amer: 30 mL/min — ABNORMAL LOW (ref >60)
GFR calc non Af Amer: 25 mL/min — ABNORMAL LOW (ref >60)
GFR calc non Af Amer: 35 mL/min — ABNORMAL LOW (ref >60)
Glucose, Bld: 411 mg/dL — ABNORMAL HIGH (ref 70–99)
Glucose, Bld: 325 mg/dL — ABNORMAL HIGH (ref 70–99)
Glucose, Bld: 307 mg/dL — ABNORMAL HIGH (ref 70–99)
Glucose, Bld: 230 mg/dL — ABNORMAL HIGH (ref 70–99)
Potassium: 3.5 mmol/L (ref 3.5–5.1)
Potassium: 3.3 mmol/L — ABNORMAL LOW (ref 3.5–5.1)
Potassium: 3.4 mmol/L — ABNORMAL LOW (ref 3.5–5.1)
Potassium: 4.8 mmol/L (ref 3.5–5.1)
Sodium: 156 mmol/L — ABNORMAL HIGH (ref 135–145)
Sodium: 156 mmol/L — ABNORMAL HIGH (ref 135–145)
Sodium: 156 mmol/L — ABNORMAL HIGH (ref 135–145)
Sodium: 149 mmol/L — ABNORMAL HIGH (ref 135–145)

## 2019-09-03 LAB — LACTIC ACID, PLASMA
Lactic Acid, Venous: 2.6 mmol/L (ref 0.5–1.9)
Lactic Acid, Venous: 4.5 mmol/L (ref 0.5–1.9)
Lactic Acid, Venous: 3.3 mmol/L (ref 0.5–1.9)

## 2019-09-03 LAB — COMPREHENSIVE METABOLIC PANEL
ALT: 14 U/L (ref 0–44)
AST: 16 U/L (ref 15–41)
Albumin: 3 g/dL — ABNORMAL LOW (ref 3.5–5.0)
Alkaline Phosphatase: 52 U/L (ref 38–126)
Anion gap: 11 (ref 5–15)
BUN: 61 mg/dL — ABNORMAL HIGH (ref 8–23)
CO2: 25 mmol/L (ref 22–32)
Calcium: 8.4 mg/dL — ABNORMAL LOW (ref 8.9–10.3)
Chloride: 121 mmol/L — ABNORMAL HIGH (ref 98–111)
Creatinine, Ser: 1.73 mg/dL — ABNORMAL HIGH (ref 0.44–1.00)
GFR calc Af Amer: 30 mL/min — ABNORMAL LOW (ref >60)
GFR calc non Af Amer: 26 mL/min — ABNORMAL LOW (ref >60)
Glucose, Bld: 409 mg/dL — ABNORMAL HIGH (ref 70–99)
Potassium: 3.6 mmol/L (ref 3.5–5.1)
Sodium: 157 mmol/L — ABNORMAL HIGH (ref 135–145)
Total Bilirubin: 0.8 mg/dL (ref 0.3–1.2)
Total Protein: 6 g/dL — ABNORMAL LOW (ref 6.5–8.1)

## 2019-09-03 LAB — GLUCOSE, CAPILLARY: Glucose-Capillary: 196 mg/dL — ABNORMAL HIGH (ref 70–99)

## 2019-09-03 LAB — CK: Total CK: 60 U/L (ref 38–234)

## 2019-09-03 LAB — CBG MONITORING, ED: Glucose-Capillary: 340 mg/dL — ABNORMAL HIGH (ref 70–99)

## 2019-09-03 LAB — MRSA PCR SCREENING: MRSA by PCR: POSITIVE — AB

## 2019-09-03 MED ORDER — LACTATED RINGERS IV BOLUS (SEPSIS)
1000.0000 mL | Freq: Once | INTRAVENOUS | Status: AC
  Administered 2019-09-03: 1000 mL via INTRAVENOUS

## 2019-09-03 MED ORDER — MORPHINE SULFATE (CONCENTRATE) 10 MG/0.5ML PO SOLN: 5.0000 mg | ORAL | Status: DC | PRN

## 2019-09-03 MED ORDER — METRONIDAZOLE IN NACL 5-0.79 MG/ML-% IV SOLN
500.0000 mg | Freq: Three times a day (TID) | INTRAVENOUS | Status: DC
  Administered 2019-09-03 (×2): 500 mg via INTRAVENOUS
  Filled 2019-09-03 (×2): qty 100

## 2019-09-03 MED ORDER — ONDANSETRON 4 MG PO TBDP: 4.0000 mg | ORAL_TABLET | Freq: Four times a day (QID) | ORAL | Status: DC | PRN

## 2019-09-03 MED ORDER — BISACODYL 10 MG RE SUPP: 10.0000 mg | Freq: Every day | RECTAL | Status: DC | PRN

## 2019-09-03 MED ORDER — HYDROMORPHONE HCL 1 MG/ML IJ SOLN
0.5000 mg | INTRAMUSCULAR | Status: DC | PRN
  Administered 2019-09-03 – 2019-09-05 (×7): 0.5 mg via INTRAVENOUS
  Filled 2019-09-03 (×7): qty 0.5

## 2019-09-03 MED ORDER — SODIUM CHLORIDE 0.45 % IV BOLUS
1000.0000 mL | Freq: Once | INTRAVENOUS | Status: AC
  Administered 2019-09-03: 1000 mL via INTRAVENOUS

## 2019-09-03 MED ORDER — SODIUM CHLORIDE 0.9 % IV BOLUS
1000.0000 mL | Freq: Once | INTRAVENOUS | Status: AC
  Administered 2019-09-03: 1000 mL via INTRAVENOUS

## 2019-09-03 MED ORDER — INSULIN ASPART 100 UNIT/ML ~~LOC~~ SOLN
12.0000 [IU] | Freq: Once | SUBCUTANEOUS | Status: AC
  Administered 2019-09-03: 12 [IU] via SUBCUTANEOUS
  Filled 2019-09-03: qty 1

## 2019-09-03 MED ORDER — ONDANSETRON HCL 4 MG PO TABS: 4.0000 mg | ORAL_TABLET | Freq: Four times a day (QID) | ORAL | Status: DC | PRN

## 2019-09-03 MED ORDER — BIOTENE DRY MOUTH MT LIQD: 15.0000 mL | OROMUCOSAL | Status: DC | PRN

## 2019-09-03 MED ORDER — GLYCOPYRROLATE 0.2 MG/ML IJ SOLN: 0.2000 mg | INTRAMUSCULAR | Status: DC | PRN

## 2019-09-03 MED ORDER — ACETAMINOPHEN 650 MG RE SUPP
650.0000 mg | Freq: Once | RECTAL | Status: AC
  Administered 2019-09-03: 650 mg via RECTAL
  Filled 2019-09-03: qty 1

## 2019-09-03 MED ORDER — GLYCOPYRROLATE 1 MG PO TABS: 1.0000 mg | ORAL_TABLET | ORAL | Status: DC | PRN

## 2019-09-03 MED ORDER — NYSTATIN 100000 UNIT/GM EX POWD: Freq: Two times a day (BID) | CUTANEOUS | Status: DC

## 2019-09-03 MED ORDER — INSULIN ASPART 100 UNIT/ML ~~LOC~~ SOLN
0.0000 [IU] | Freq: Three times a day (TID) | SUBCUTANEOUS | Status: DC
  Administered 2019-09-03: 2 [IU] via SUBCUTANEOUS
  Administered 2019-09-03: 7 [IU] via SUBCUTANEOUS
  Filled 2019-09-03: qty 1

## 2019-09-03 MED ORDER — ONDANSETRON HCL 4 MG/2ML IJ SOLN: 4.0000 mg | Freq: Four times a day (QID) | INTRAMUSCULAR | Status: DC | PRN

## 2019-09-03 MED ORDER — LORAZEPAM 1 MG PO TABS: 1.0000 mg | ORAL_TABLET | ORAL | Status: DC | PRN

## 2019-09-03 MED ORDER — ENOXAPARIN SODIUM 40 MG/0.4ML ~~LOC~~ SOLN: 40.0000 mg | SUBCUTANEOUS | Status: DC

## 2019-09-03 MED ORDER — POLYVINYL ALCOHOL 1.4 % OP SOLN: 1.0000 [drp] | Freq: Four times a day (QID) | OPHTHALMIC | Status: DC | PRN

## 2019-09-03 MED ORDER — ACETAMINOPHEN 650 MG RE SUPP: 650.0000 mg | Freq: Four times a day (QID) | RECTAL | Status: DC | PRN

## 2019-09-03 MED ORDER — LORAZEPAM 2 MG/ML IJ SOLN
1.0000 mg | INTRAMUSCULAR | Status: DC | PRN
  Administered 2019-09-03 – 2019-09-05 (×2): 1 mg via INTRAVENOUS
  Filled 2019-09-03 (×2): qty 1

## 2019-09-03 MED ORDER — MUPIROCIN 2 % EX OINT: 1.0000 "application " | TOPICAL_OINTMENT | Freq: Two times a day (BID) | CUTANEOUS | Status: DC

## 2019-09-03 MED ORDER — PANTOPRAZOLE SODIUM 40 MG PO TBEC: 40.0000 mg | DELAYED_RELEASE_TABLET | Freq: Every day | ORAL | Status: DC

## 2019-09-03 MED ORDER — CHLORHEXIDINE GLUCONATE CLOTH 2 % EX PADS
6.0000 | MEDICATED_PAD | Freq: Every day | CUTANEOUS | Status: DC
  Administered 2019-09-03: 6 via TOPICAL

## 2019-09-03 MED ORDER — LORAZEPAM 2 MG/ML IJ SOLN
0.5000 mg | Freq: Once | INTRAMUSCULAR | Status: AC
  Administered 2019-09-03: 0.5 mg via INTRAVENOUS
  Filled 2019-09-03: qty 1

## 2019-09-03 MED ORDER — DIPHENHYDRAMINE HCL 50 MG/ML IJ SOLN: 12.5000 mg | INTRAMUSCULAR | Status: DC | PRN

## 2019-09-03 MED ORDER — LORAZEPAM 2 MG/ML PO CONC: 1.0000 mg | ORAL | Status: DC | PRN

## 2019-09-03 MED ORDER — ENOXAPARIN SODIUM 30 MG/0.3ML ~~LOC~~ SOLN
30.0000 mg | SUBCUTANEOUS | Status: DC
  Administered 2019-09-03: 30 mg via SUBCUTANEOUS
  Filled 2019-09-03: qty 0.3

## 2019-09-03 MED ORDER — INSULIN DETEMIR 100 UNIT/ML ~~LOC~~ SOLN
15.0000 [IU] | Freq: Every day | SUBCUTANEOUS | Status: DC
  Filled 2019-09-03 (×3): qty 0.15

## 2019-09-03 MED ORDER — DIVALPROEX SODIUM 125 MG PO CSDR: 250.0000 mg | DELAYED_RELEASE_CAPSULE | Freq: Four times a day (QID) | ORAL | Status: DC

## 2019-09-03 NOTE — H&P (Signed)
History and Physical    Valerie Carr YQM:[id number] DOB: 30-Sep-1929 DOA: 09/02/2019  PCP: [place], [place]   Patient coming from: Home.   I have personally briefly reviewed patient's old medical records [facility]  Chief Complaint: AMS.  HPI: Valerie Pianomogene J Scullion is a 86 y.o. female with medical history significant of arteriosclerotic cardiovascular disease, chronic anxiety and depression, dementia, type 2 diabetes, gallstones, hypertension, hyperlipidemia, sick sinus syndrome, history of pacemaker placement, history of other nonhemorrhagic CVA who was [facility] for AMS and decreased appetite.  EMS described that the patient was febrile, tachycardic in the 120s and 130s and hypoxic of 80% on room air.  [facility] staff told the patient had a hip replacement with a loose screw that could lead to any infection.  She received a gram of ceftriaxone the facility.  She is on BiPAP ventilation now and unable to provide further information.  ED Course: Initial vital signs were temperature 102.3 F, pulse 117, respirations 23, BP 143/73 mmHg and O2 sat 96% on BiPAP.  She was given acetaminophen 650 mg PR, cefepime, metronidazole and vancomycin per pharmacy along with 500 mL of NS bolus.  Urinalysis was cloudy with glucosuria more than 500 and proteinuria more than 30 mg/dL.  There was trace leukocyte esterase with many bacteria on microscopic examination.  CBC showed a white count of 21.3, hemoglobin 16.1 grams per deciliter and platelets 214.  PT is/INR/PTT within normal limits.  Sodium 160, potassium 3.1, chloride 121 and CO2 27 mmol/L.  Glucose 322, BUN 60 and creatinine 1.19 mg/dL.  LFTs are within normal limits.  SARS coronavirus 2 was negative.  Review of Systems: As per HPI otherwise all other systems reviewed and are negative.  Past Medical History:  Diagnosis Date  . Arteriosclerotic cardiovascular disease (ASCVD)   . Chronic anxiety    and depression  . Dementia (HCC)    . DM type 2 (diabetes mellitus, type 2) (HCC)   . Gallstones    asysmptomatic  . HTN (hypertension)   . Hyperlipidemia   . Sick sinus syndrome (HCC)    s/p PPM  . Stroke [facility](HCC)    Past Surgical History:  Procedure Laterality Date  . ABDOMINAL SURGERY     12 inches taken out of "rotted bowel"  . APPENDECTOMY    . CORONARY ARTERY BYPASS GRAFT     07-11-2005, triple bypass  . ESOPHAGOGASTRODUODENOSCOPY    . INTRAMEDULLARY (IM) NAIL INTERTROCHANTERIC Right 07/20/2019   Procedure: INTRAMEDULLARY (IM) NAIL INTERTROCHANTRIC;  Surgeon: Roby LoftsHaddix, Kevin P, MD;  Location: [facility] OR;  Service: Orthopedics;  Laterality: Right;  . LAPAROTOMY N/A 04/20/2016   Procedure: EXPLORATORY LAPAROTOMY, SMALL BOWEL RESECTION, LYSIS OF ADHESIONS;  Surgeon: Darnell Levelodd Gerkin, MD;  Location: [facility] ORS;  Service: General;  Laterality: N/A;  . PACEMAKER INSERTION  2004   [employer] PPM implanted by Dr Amil AmenEdmunds, generator change by Dr Johney FrameAllred 01/07/11  . PERMANENT PACEMAKER GENERATOR CHANGE N/A 01/07/2011   Procedure: PERMANENT PACEMAKER GENERATOR CHANGE;  Surgeon: Hillis RangeJames Allred, MD;  [facility];  Service: Cardiovascular;  Laterality: N/A;  . TONSILLECTOMY    . TOTAL ABDOMINAL HYSTERECTOMY W/ BILATERAL SALPINGOOPHORECTOMY     Social History  reports that she quit smoking about 20 years ago. She has a 25.00 pack-year smoking history. She has quit using smokeless tobacco. She reports that she does not drink alcohol and does not use drugs.  Allergies  Allergen Reactions  . Clarithromycin Other (See Comments)  unknown  . Codeine Phosphate Other (See Comments)    unknown  . Effexor [Venlafaxine Hydrochloride] Other (See Comments)    unknown  . Fexofenadine Hcl Other (See Comments)    unknown  . Lisinopril Other (See Comments)    unknown  . Prozac [Fluoxetine Hcl] Other (See Comments)    unknown  . Septra [Bactrim] Other (See Comments)    unknown  . Wellbutrin [Bupropion Hcl] Other (See Comments)     unknown   Family History  Problem Relation Age of Onset  . Diabetes Mother   . Hypertension Mother   . Coronary artery disease Father   . Diabetes Brother   . Throat cancer Brother   . Diabetes Brother   . Hypertension Sister   . Cancer Son        esophageal   Prior to Admission medications   Medication Sig Start Date End Date Taking? Authorizing Provider  aspirin 325 MG tablet Take 325 mg by mouth daily.   Yes [provider]  atorvastatin (LIPITOR) 20 MG tablet Take 1 tablet (20 mg total) by mouth daily. 07/25/19  Yes Drema Dallas, MD  cefTRIAXone (ROCEPHIN) 1 g injection Inject 1 g into the muscle daily. Starting 09/02/19 x 5 days.   Yes [provider]  Cholecalciferol (VITAMIN D3) 50 MCG (2000 UT) TABS Take 2,000 Units by mouth every morning.   Yes [provider]  docusate sodium (COLACE) 100 MG capsule Take 1 capsule (100 mg total) by mouth 2 (two) times daily. 07/24/19  Yes Drema Dallas, MD  DULoxetine (CYMBALTA) 60 MG capsule Take 60 mg by mouth every morning. For depression   Yes [provider]  HYDROcodone-acetaminophen (NORCO/VICODIN) 5-325 MG tablet Take 0.5 tablets by mouth in the morning, at noon, and at bedtime.   Yes [provider]  insulin detemir (LEVEMIR) 100 UNIT/ML injection Inject 15 Units into the skin at bedtime.   Yes [provider]  insulin lispro (HUMALOG) 100 UNIT/ML injection Inject 8 Units into the skin 3 (three) times daily before meals.   Yes [provider]  magnesium oxide (MAG-OX) 400 (241.3 Mg) MG tablet Take 1 tablet (400 mg total) by mouth daily. 07/25/19  Yes Drema Dallas, MD  methocarbamol (ROBAXIN) 500 MG tablet Take 1 tablet (500 mg total) by mouth every 6 (six) hours as needed for muscle spasms. 07/24/19  Yes Drema Dallas, MD  Multiple Vitamin (MULTIVITAMIN WITH MINERALS) TABS tablet Take 1 tablet by mouth daily. 07/25/19  Yes Drema Dallas, MD  pantoprazole sodium  (PROTONIX) 40 mg/20 mL PACK Take 40 mg by mouth daily.   Yes [provider]  senna-docusate (SENOKOT-S) 8.6-50 MG tablet Take 1 tablet by mouth 2 (two) times daily. 07/24/19  Yes Drema Dallas, MD  traZODone (DESYREL) 50 MG tablet Take 50 mg by mouth at bedtime.    Yes [provider]  acetaminophen (TYLENOL) 325 MG tablet Take 1-2 tablets (325-650 mg total) by mouth every 6 (six) hours as needed for mild pain (pain score 1-3 or temp > 100.5). 07/21/19   Haddix, Gillie Manners, MD  divalproex (DEPAKOTE SPRINKLE) 125 MG capsule Take 2 capsules (250 mg total) by mouth 4 (four) times daily. 07/24/19   Drema Dallas, MD  ibuprofen (ADVIL) 600 MG tablet Take 1 tablet (600 mg total) by mouth every 6 (six) hours as needed for moderate pain. 07/21/19   Haddix, Gillie Manners, MD  insulin aspart (NOVOLOG) 100 UNIT/ML injection  Inject 8 Units into the skin 3 (three) times daily before meals. 07/24/19   Drema Dallas, MD  nystatin (MYCOSTATIN/NYSTOP) powder Apply topically 2 (two) times daily. 07/24/19   Drema Dallas, MD  ondansetron (ZOFRAN) 4 MG tablet Take 1 tablet (4 mg total) by mouth every 6 (six) hours as needed for nausea. 07/24/19   Drema Dallas, MD  pantoprazole (PROTONIX) 40 MG tablet Take 1 tablet (40 mg total) by mouth daily. 07/25/19   Drema Dallas, MD    Physical Exam: Vitals:   09/03/19 0434 09/03/19 0450 09/03/19 0459 09/03/19 0630  BP: (!) 100/52  103/62 107/68  Pulse: (!) 117  (!) 114 (!) 104  Resp: (!) 23  (!) 22 (!) 25  Temp:  (!) 100.5 F (38.1 C)    TempSrc:  Rectal    SpO2: 100%  100% 99%  Weight:      Height:       Constitutional: Frail, elderly female.  Looks acutely ill. Eyes: PERRL, lids and conjunctivae mildly injected. ENMT: Mucous membranes are dry.  BiPAP mask on. Neck: normal, supple, no masses, no thyromegaly Respiratory: On BiPAP.  Good air entry, mild rhonchi, no wheezing, no crackles. Normal respiratory effort. No accessory muscle use.    Cardiovascular: Tachycardic in the 110s with an irregularly irregular rhythm, no murmurs / rubs / gallops. No extremity edema. 2+ pedal pulses. No carotid bruits.  Abdomen: Soft, no tenderness, no masses palpated. No hepatosplenomegaly. Bowel sounds positive.  Musculoskeletal: no clubbing / cyanosis. Good ROM, no contractures. Normal muscle tone.  Skin: Some areas of ecchymosis on extremities. Neurologic: Obtunded.  Unable to fully evaluate.  Seems nonfocal. Psychiatric: Obtunded.  On BiPAP.  Labs on Admission: I have personally reviewed following labs and imaging studies  CBC: Recent Labs  Lab 09/02/19 2155 09/03/19 0552  WBC 21.3* PENDING  NEUTROABS 18.1* PENDING  HGB 16.1* 13.1  HCT 52.4* 44.6  MCV 99.6 103.7*  PLT 214 137*    Basic Metabolic Panel: Recent Labs  Lab 09/02/19 2155 09/03/19 0239  NA 160* 156*  K 3.1* 3.5  CL 121* 122*  CO2 27 26  GLUCOSE 322* 411*  BUN 60* 61*  CREATININE 1.19* 1.60*  CALCIUM 9.7 8.7*    GFR: Estimated Creatinine Clearance: 23.5 mL/min (A) (by C-G formula based on SCr of 1.6 mg/dL (H)).  Liver Function Tests: Recent Labs  Lab 09/02/19 2155  AST 15  ALT 17  ALKPHOS 73  BILITOT 0.5  PROT 7.8  ALBUMIN 3.9    Urine analysis:    Component Value Date/Time   COLORURINE YELLOW 09/02/2019 2154   APPEARANCEUR CLOUDY (A) 09/02/2019 2154   LABSPEC 1.030 09/02/2019 2154   PHURINE 5.0 09/02/2019 2154   GLUCOSEU >=500 (A) 09/02/2019 2154   HGBUR SMALL (A) 09/02/2019 2154   BILIRUBINUR NEGATIVE 09/02/2019 2154   KETONESUR NEGATIVE 09/02/2019 2154   PROTEINUR 30 (A) 09/02/2019 2154   UROBILINOGEN 1.0 05/26/2013 1648   NITRITE NEGATIVE 09/02/2019 2154   LEUKOCYTESUR TRACE (A) 09/02/2019 2154    Radiological Exams on Admission: DG Chest Port 1 View  Result Date: 09/02/2019 CLINICAL DATA:  Shortness of breath, altered mental status EXAM: PORTABLE CHEST 1 VIEW COMPARISON:  Radiograph 07/19/2019 FINDINGS: Chronic hyperinflation and  coarsened interstitial changes present throughout both lungs with superimposed areas of patchy consolidative opacity throughout the right hemithorax and perhaps minimally in the left lung base left upper lobe as well. The aorta is calcified. The remaining cardiomediastinal contours are  unremarkable. Postsurgical changes related to prior CABG including intact and aligned sternotomy wires and multiple surgical clips projecting over the mediastinum. Pacer pack overlies the left chest wall with leads at the right atrium and cardiac apex. The osseous structures appear diffusely demineralized which may limit detection of small or nondisplaced fractures. No acute osseous abnormality or suspicious osseous lesion. Degenerative changes are present in the imaged spine and shoulders. Telemetry leads overlie the chest. IMPRESSION: Chronic hyperinflation and coarsened interstitial changes throughout both lungs Superimposed patchy consolidative opacity throughout the right hemithorax and within the left apex and lung base. Worrisome for a multifocal infectious process. Electronically Signed   By: Kreg Shropshire M.D.   On: 09/02/2019 22:32   EKG: Independently reviewed.  Vent. rate 144 BPM PR interval * ms QRS duration 86 ms QT/QTc 270/442 ms P-R-T axes * -78 * Atrial fibrillation with rapid V-rate Inferior infarct, old Probable anterior infarct, age indeterminate  Assessment/Plan Principal Problem:   Hypernatremia Total water deficit Admit to stepdown/inpatient. Continue 0.45% NaCl plus KCl 20 MEQ infusion. Monitor sodium and potassium closely.  Active Problems:   Sepsis due to undetermined organism POA (HCC) Continue IV fluids. Continue cefepime, vancomycin and metronidazole. Follow-up blood culture and sensitivity    AKI (acute kidney injury) (HCC) Continue IV fluids. Monitor intake and output. Follow renal function electrolytes.    Hyperglycemia   Type 2 diabetes mellitus (HCC) Currently  n.p.o. Hold Lantus until back on diet. CBG monitoring with RI SS. Continue IV fluids. Received NovoLog SQ earlier.    Hypokalemia Continue potassium supplementation. Monitor potassium level closely.    Alzheimer's disease (HCC) Continue Cymbalta 60 mg p.o. every morning. Continue Depakote 250 mg p.o. 4 times daily. Continue trazodone 50 mg p.o. at bedtime. Supportive care.    Chronic atrial fibrillation (HCC) CHA?DS?-VASc Score least 8. Not on anticoagulation or rate control meds. Diltiazem and order beta-blocker as needed.   DVT prophylaxis: Lovenox SQ. Code Status:   DNR. Family Communication:   Disposition Plan:   Patient is from:  [facility]  Anticipated DC to:  [facility]  Anticipated DC date:  09/06/2019.  Anticipated DC barriers: Clinical status. Consults called: Admission status:  Inpatient/stepdown.   Severity of Illness: Very high due to hypernatremia and sepsis.  Bobette Mo MD [facility]  How to contact the [facility] Attending or Consulting provider 7A - 7P or covering provider during after hours 7P -7A, for this patient?   1. Check the care team in [facility] and look for a) attending/consulting TRH provider listed and b) the [facility] team listed 2. Log into [email or web address] and use [place]'s universal password to access. If you do not have the password, please contact the hospital operator. 3. Locate the [facility] provider you are looking for under [facility] and page to a number that you can be directly reached. 4. If you still have difficulty reaching the provider, please page the [facility] (Director on Call) for the Hospitalists listed on amion for assistance.  09/03/2019, 6:53 AM   This document was prepared using Dragon voice recognition software may contain some unintended transcription errors.

## 2019-09-03 NOTE — Progress Notes (Signed)
Daughter [name]- POA is at bedside speaking with Dr. Laural Benes about patient's plan of care. Daughter very tearful and upset speaking about mother's condition. Will continue to monitor and offer emotional support as needed.

## 2019-09-03 NOTE — ED Notes (Signed)
CRITICAL VALUE ALERT  Critical Value:  Lactic 4.5  Date & Time Notied: 0851  Provider Notified:johnson Orders Received/Actions taken: orders placed

## 2019-09-03 NOTE — Progress Notes (Signed)
Report called and given to Fleet Contras, RN on 300. Pt to be transferred to room 321 via bed.

## 2019-09-03 NOTE — Progress Notes (Signed)
Notified Dr. Laural Benes by Amion,  lactic acid 3.3

## 2019-09-03 NOTE — Progress Notes (Signed)
09/03/2019 2:45 PM   I had a long discussion with patient's daughter and son.  They are both in agreement that they would not want further full scope care if patient showing signs of no meaningful improvement.  She remains obtunded.  She is grimacing with discomfort with light touch and does not want further blood draws etc. They requested that we start full comfort care measures and to stop full scope treatments. The are agreeable to foley placement for comfort.  They want to DC IV, fluids, antibiotics and to focus care on end of life comfort care.   I have ordered to open up visitation for comfort care and starting full comfort care orders.  Discussed with bedside RN.    Time spent: 40 minutes  Maryln Manuel MD  How to contact the [facility] Attending or Consulting provider 7A - 7P or covering provider during after hours 7P -7A, for this patient?  1. Check the care team in [facility] and look for a) attending/consulting TRH provider listed and b) the [facility] team listed 2. Log into [email or web address] and use [place]'s universal password to access. If you do not have the password, please contact the hospital operator. 3. Locate the [facility] provider you are looking for under [facility] and page to a number that you can be directly reached. 4. If you still have difficulty reaching the provider, please page the [facility] (Director on Call) for the Hospitalists listed on amion for assistance.

## 2019-09-03 NOTE — ED Notes (Signed)
Daughter notified of transfer.

## 2019-09-03 NOTE — ED Notes (Signed)
Date and time results received: 09/03/19 0409 (use smartphrase ".now" to insert current time)  Test: lactic acid Critical Value: 2.6  Name of Provider Notified: Dr Robb Matar  Orders Received? Or Actions Taken?: Actions Taken: orders received see chart

## 2019-09-04 DIAGNOSIS — E876 Hypokalemia: Secondary | ICD-10-CM

## 2019-09-04 DIAGNOSIS — A419 Sepsis, unspecified organism: Principal | ICD-10-CM

## 2019-09-04 LAB — URINE CULTURE

## 2019-09-04 NOTE — TOC Progression Note (Signed)
Transition of Care [facility]) - Progression Note    Patient Details  Name: Valerie Carr MRN: [id number] Date of Birth: December 06, 1929  [facility] CM/SW Contact  Barry Brunner, LCSW Phone Number: 09/04/2019, 3:55 PM  Clinical Narrative:    CSW spoke with family about Hospice. Family agreeable to hospice with [facility]. CSW placed referral for [facility]. [facility] reported that they will be able to access patient on 09/04/2019. TOC to follow.            Readmission Risk Interventions No flowsheet data found.

## 2019-09-04 NOTE — Progress Notes (Signed)
09/04/2019 11:06 AM   Principal Problem:   Hypernatremia Active Problems:   Alzheimer's disease (HCC)   Chronic atrial fibrillation (HCC)   AKI (acute kidney injury) (HCC)   Hyperglycemia   Hypokalemia   Sepsis due to undetermined organism (HCC)   Acute respiratory failure with hypoxia (HCC)   Pt has been placed on full comfort care status per family wishes.  Focus of care is comfort and dignity.  Pt was seen and examined today.  She is resting comfortably. Mouth breathing.  She is not groaning or grimacing with examination.    Vitals:   09/03/19 1645 09/03/19 1700  BP:    Pulse:  90  Resp: 18 17  Temp:    SpO2:  99%   A/P Severe sepsis  Severe dehydration Hypernatremia  Acute renal failue Septic Shock  Continue full comfort care measures.   Family considering residential hospice placement, will let [place] know. TOC consulted to reach out to family about their wishes.    Maryln Manuel MD

## 2019-09-04 NOTE — Plan of Care (Signed)

## 2019-09-05 ENCOUNTER — Inpatient Hospital Stay (HOSPITAL_COMMUNITY)
Admission: RE | Admit: 2019-09-05 | Discharge: 2019-09-28 | DRG: 951 | Disposition: E | Source: Skilled Nursing Facility | Attending: Internal Medicine | Admitting: Internal Medicine

## 2019-09-05 DIAGNOSIS — E870 Hyperosmolality and hypernatremia: Secondary | ICD-10-CM | POA: Diagnosis not present

## 2019-09-05 DIAGNOSIS — Z8673 Personal history of transient ischemic attack (TIA), and cerebral infarction without residual deficits: Secondary | ICD-10-CM

## 2019-09-05 DIAGNOSIS — I10 Essential (primary) hypertension: Secondary | ICD-10-CM | POA: Diagnosis present

## 2019-09-05 DIAGNOSIS — Z515 Encounter for palliative care: Secondary | ICD-10-CM | POA: Diagnosis present

## 2019-09-05 DIAGNOSIS — Z833 Family history of diabetes mellitus: Secondary | ICD-10-CM

## 2019-09-05 DIAGNOSIS — F0281 Dementia in other diseases classified elsewhere with behavioral disturbance: Secondary | ICD-10-CM | POA: Diagnosis not present

## 2019-09-05 DIAGNOSIS — E119 Type 2 diabetes mellitus without complications: Secondary | ICD-10-CM | POA: Diagnosis present

## 2019-09-05 DIAGNOSIS — Z951 Presence of aortocoronary bypass graft: Secondary | ICD-10-CM | POA: Diagnosis not present

## 2019-09-05 DIAGNOSIS — A419 Sepsis, unspecified organism: Secondary | ICD-10-CM | POA: Diagnosis present

## 2019-09-05 DIAGNOSIS — Z8249 Family history of ischemic heart disease and other diseases of the circulatory system: Secondary | ICD-10-CM | POA: Diagnosis not present

## 2019-09-05 DIAGNOSIS — I482 Chronic atrial fibrillation, unspecified: Secondary | ICD-10-CM

## 2019-09-05 DIAGNOSIS — Z66 Do not resuscitate: Secondary | ICD-10-CM | POA: Diagnosis present

## 2019-09-05 DIAGNOSIS — E785 Hyperlipidemia, unspecified: Secondary | ICD-10-CM | POA: Diagnosis present

## 2019-09-05 DIAGNOSIS — I2510 Atherosclerotic heart disease of native coronary artery without angina pectoris: Secondary | ICD-10-CM | POA: Diagnosis not present

## 2019-09-05 DIAGNOSIS — G300 Alzheimer's disease with early onset: Secondary | ICD-10-CM | POA: Diagnosis not present

## 2019-09-05 DIAGNOSIS — R52 Pain, unspecified: Secondary | ICD-10-CM | POA: Diagnosis not present

## 2019-09-05 DIAGNOSIS — Z950 Presence of cardiac pacemaker: Secondary | ICD-10-CM

## 2019-09-05 DIAGNOSIS — E860 Dehydration: Secondary | ICD-10-CM | POA: Diagnosis present

## 2019-09-05 DIAGNOSIS — R6521 Severe sepsis with septic shock: Secondary | ICD-10-CM | POA: Diagnosis present

## 2019-09-05 DIAGNOSIS — J9610 Chronic respiratory failure, unspecified whether with hypoxia or hypercapnia: Secondary | ICD-10-CM | POA: Diagnosis not present

## 2019-09-05 DIAGNOSIS — G309 Alzheimer's disease, unspecified: Secondary | ICD-10-CM | POA: Diagnosis not present

## 2019-09-05 DIAGNOSIS — E876 Hypokalemia: Secondary | ICD-10-CM | POA: Diagnosis not present

## 2019-09-05 DIAGNOSIS — F0390 Unspecified dementia without behavioral disturbance: Secondary | ICD-10-CM | POA: Diagnosis present

## 2019-09-05 DIAGNOSIS — E43 Unspecified severe protein-calorie malnutrition: Secondary | ICD-10-CM | POA: Diagnosis present

## 2019-09-05 DIAGNOSIS — J189 Pneumonia, unspecified organism: Secondary | ICD-10-CM | POA: Diagnosis present

## 2019-09-05 DIAGNOSIS — R739 Hyperglycemia, unspecified: Secondary | ICD-10-CM

## 2019-09-05 DIAGNOSIS — R630 Anorexia: Secondary | ICD-10-CM | POA: Diagnosis not present

## 2019-09-05 DIAGNOSIS — N179 Acute kidney failure, unspecified: Secondary | ICD-10-CM | POA: Diagnosis not present

## 2019-09-05 MED ORDER — MORPHINE BOLUS VIA INFUSION
0.5000 mg | INTRAVENOUS | Status: DC | PRN
  Administered 2019-09-05 – 2019-09-07 (×10): 0.5 mg via INTRAVENOUS
  Filled 2019-09-05: qty 1

## 2019-09-05 MED ORDER — MORPHINE 100MG IN NS 100ML (1MG/ML) PREMIX INFUSION
0.5000 mg/h | INTRAVENOUS | Status: DC
  Filled 2019-09-05: qty 100

## 2019-09-05 MED ORDER — GLYCOPYRROLATE 1 MG PO TABS: 1.0000 mg | ORAL_TABLET | ORAL | Status: DC | PRN

## 2019-09-05 MED ORDER — LORAZEPAM 2 MG/ML IJ SOLN
1.0000 mg | INTRAMUSCULAR | Status: DC | PRN
  Administered 2019-09-05 – 2019-09-06 (×2): 1 mg via INTRAVENOUS
  Filled 2019-09-05 (×2): qty 1

## 2019-09-05 MED ORDER — POLYVINYL ALCOHOL 1.4 % OP SOLN: 1.0000 [drp] | Freq: Four times a day (QID) | OPHTHALMIC | Status: DC | PRN

## 2019-09-05 MED ORDER — GLYCOPYRROLATE 0.2 MG/ML IJ SOLN: 0.2000 mg | INTRAMUSCULAR | Status: DC | PRN

## 2019-09-05 MED ORDER — BISACODYL 10 MG RE SUPP: 10.0000 mg | Freq: Every day | RECTAL | Status: DC | PRN

## 2019-09-05 MED ORDER — MORPHINE SULFATE (PF) 2 MG/ML IV SOLN
1.0000 mg | Freq: Once | INTRAVENOUS | Status: DC
  Filled 2019-09-05: qty 1

## 2019-09-05 MED ORDER — LORAZEPAM 1 MG PO TABS: 1.0000 mg | ORAL_TABLET | ORAL | Status: DC | PRN

## 2019-09-05 MED ORDER — MORPHINE BOLUS VIA INFUSION
0.5000 mg | INTRAVENOUS | Status: DC | PRN
  Filled 2019-09-05: qty 1

## 2019-09-05 MED ORDER — BIOTENE DRY MOUTH MT LIQD: 15.0000 mL | OROMUCOSAL | Status: DC | PRN

## 2019-09-05 MED ORDER — DIPHENHYDRAMINE HCL 50 MG/ML IJ SOLN: 12.5000 mg | INTRAMUSCULAR | Status: DC | PRN

## 2019-09-05 MED ORDER — ONDANSETRON 4 MG PO TBDP: 4.0000 mg | ORAL_TABLET | Freq: Four times a day (QID) | ORAL | Status: DC | PRN

## 2019-09-05 MED ORDER — CHLORHEXIDINE GLUCONATE CLOTH 2 % EX PADS
6.0000 | MEDICATED_PAD | Freq: Every day | CUTANEOUS | Status: DC
  Administered 2019-09-07: 6 via TOPICAL

## 2019-09-05 MED ORDER — ACETAMINOPHEN 650 MG RE SUPP
650.0000 mg | Freq: Four times a day (QID) | RECTAL | Status: DC | PRN
  Administered 2019-09-06 – 2019-09-07 (×3): 650 mg via RECTAL
  Filled 2019-09-05 (×4): qty 1

## 2019-09-05 MED ORDER — MORPHINE 100MG IN NS 100ML (1MG/ML) PREMIX INFUSION
0.5000 mg/h | INTRAVENOUS | Status: DC
  Administered 2019-09-05: 0.5 mg/h via INTRAVENOUS

## 2019-09-05 MED ORDER — HYDROMORPHONE HCL 1 MG/ML IJ SOLN: 0.5000 mg | INTRAMUSCULAR | Status: DC | PRN

## 2019-09-05 MED ORDER — ONDANSETRON HCL 4 MG/2ML IJ SOLN: 4.0000 mg | Freq: Four times a day (QID) | INTRAMUSCULAR | Status: DC | PRN

## 2019-09-05 NOTE — Progress Notes (Signed)
Pulled IV morphine per order for patient at 1130 not given due to patient being GIP and discharged out of system. This nurse was unable to return or waste IV morphine due to order being discontinued when patients chart was flipped to GIP. Harriett Sine RN called pharmacy at [phone] to try to return or waste me and nancy RN where unable to return or waste. Pharmacist told nancy to bring IV morphine to pharmacy and they will put it back into pyxis. This nurse took IV morphine to pharmacy and handed the medicine to [facility] pharmacist.

## 2019-09-05 NOTE — H&P (Signed)
History and Physical  [facility]  CHESLEY VALLS TWK:[id number] DOB: 1930/01/19 DOA: 09/17/2019  PCP: Lindaann Pascal   I have personally briefly reviewed patient's old medical records in [facility]  HPI: Valerie Carr is a 86 y.o. female with severe dehydration, septic shock and multifocal pneumonia was admitted for full comfort care under GIP status.   Review of Systems: UTO due to condition   Past Medical History:  Diagnosis Date  . Arteriosclerotic cardiovascular disease (ASCVD)   . Chronic anxiety    and depression  . Dementia (HCC)   . DM type 2 (diabetes mellitus, type 2) (HCC)   . Gallstones    asysmptomatic  . HTN (hypertension)   . Hyperlipidemia   . Sick sinus syndrome (HCC)    s/p PPM  . Stroke [facility])     Past Surgical History:  Procedure Laterality Date  . ABDOMINAL SURGERY     12 inches taken out of "rotted bowel"  . APPENDECTOMY    . CORONARY ARTERY BYPASS GRAFT     07-11-2005, triple bypass  . ESOPHAGOGASTRODUODENOSCOPY    . INTRAMEDULLARY (IM) NAIL INTERTROCHANTERIC Right 07/20/2019   Procedure: INTRAMEDULLARY (IM) NAIL INTERTROCHANTRIC;  Surgeon: Roby Lofts, MD;  Location: [facility] OR;  Service: Orthopedics;  Laterality: Right;  . LAPAROTOMY N/A 04/20/2016   Procedure: EXPLORATORY LAPAROTOMY, SMALL BOWEL RESECTION, LYSIS OF ADHESIONS;  Surgeon: Darnell Level, MD;  Location: [facility] ORS;  Service: General;  Laterality: N/A;  . PACEMAKER INSERTION  2004   [employer] PPM implanted by Dr Amil Amen, generator change by Dr Johney Frame 01/07/11  . PERMANENT PACEMAKER GENERATOR CHANGE N/A 01/07/2011   Procedure: PERMANENT PACEMAKER GENERATOR CHANGE;  Surgeon: Hillis Range, MD;  Location: [facility] CATH LAB;  Service: Cardiovascular;  Laterality: N/A;  . TONSILLECTOMY    . TOTAL ABDOMINAL HYSTERECTOMY W/ BILATERAL SALPINGOOPHORECTOMY       reports that she quit smoking about 20 years ago. She has a 25.00 pack-year smoking history. She has quit using smokeless  tobacco. She reports that she does not drink alcohol and does not use drugs.  Allergies  Allergen Reactions  . Clarithromycin Other (See Comments)    unknown  . Codeine Phosphate Other (See Comments)    unknown  . Effexor [Venlafaxine Hydrochloride] Other (See Comments)    unknown  . Fexofenadine Hcl Other (See Comments)    unknown  . Lisinopril Other (See Comments)    unknown  . Prozac [Fluoxetine Hcl] Other (See Comments)    unknown  . Septra [Bactrim] Other (See Comments)    unknown  . Wellbutrin [Bupropion Hcl] Other (See Comments)    unknown    Family History  Problem Relation Age of Onset  . Diabetes Mother   . Hypertension Mother   . Coronary artery disease Father   . Diabetes Brother   . Throat cancer Brother   . Diabetes Brother   . Hypertension Sister   . Cancer Son        esophageal     Prior to Admission medications   Not on File   Physical Exam: There were no vitals filed for this visit.  Constitutional: Pt somnolent, nonvocal. Pt is severely emaciated and mouth breathing and appears terminally ill.    CBC: Recent Labs  Lab 09/02/19 2155 09/03/19 0552  WBC 21.3* 19.0*  NEUTROABS 18.1* 15.8*  HGB 16.1* 13.1  HCT 52.4* 44.6  MCV 99.6 103.7*  PLT 214 137*   Basic Metabolic Panel: Recent Labs  Lab 09/03/19 0239 09/03/19 0552 09/03/19 0803 09/03/19 0948 09/03/19 1204  NA 156* 157* 156* 156* 149*  K 3.5 3.6 3.3* 3.4* 4.8  CL 122* 121* 125* 123* 118*  CO2 26 25 18* 22 23  GLUCOSE 411* 409* 325* 307* 230*  BUN 61* 61* 56* 62* 54*  CREATININE 1.60* 1.73* 1.54* 1.77* 1.36*  CALCIUM 8.7* 8.4* 7.9* 8.3* 8.0*   GFR: Estimated Creatinine Clearance: 25.7 mL/min (A) (by C-G formula based on SCr of 1.36 mg/dL (H)). Liver Function Tests: Recent Labs  Lab 09/02/19 2155 09/03/19 0552  AST 15 16  ALT 17 14  ALKPHOS 73 52  BILITOT 0.5 0.8  PROT 7.8 6.0*  ALBUMIN 3.9 3.0*   No results for input(s): LIPASE, AMYLASE in the last 168 hours. No  results for input(s): AMMONIA in the last 168 hours. Coagulation Profile: Recent Labs  Lab 09/02/19 2155  INR 1.2   Cardiac Enzymes: Recent Labs  Lab 09/03/19 0552  CKTOTAL 60   BNP (last 3 results) No results for input(s): PROBNP in the last 8760 hours. HbA1C: No results for input(s): HGBA1C in the last 72 hours. CBG: Recent Labs  Lab 09/03/19 0800 09/03/19 1142  GLUCAP 340* 196*   Lipid Profile: No results for input(s): CHOL, HDL, LDLCALC, TRIG, CHOLHDL, LDLDIRECT in the last 72 hours. Thyroid Function Tests: No results for input(s): TSH, T4TOTAL, FREET4, T3FREE, THYROIDAB in the last 72 hours. Anemia Panel: No results for input(s): VITAMINB12, FOLATE, FERRITIN, TIBC, IRON, RETICCTPCT in the last 72 hours. Urine analysis:    Component Value Date/Time   COLORURINE YELLOW 09/02/2019 2154   APPEARANCEUR CLOUDY (A) 09/02/2019 2154   LABSPEC 1.030 09/02/2019 2154   PHURINE 5.0 09/02/2019 2154   GLUCOSEU >=500 (A) 09/02/2019 2154   HGBUR SMALL (A) 09/02/2019 2154   BILIRUBINUR NEGATIVE 09/02/2019 2154   KETONESUR NEGATIVE 09/02/2019 2154   PROTEINUR 30 (A) 09/02/2019 2154   UROBILINOGEN 1.0 05/26/2013 1648   NITRITE NEGATIVE 09/02/2019 2154   LEUKOCYTESUR TRACE (A) 09/02/2019 2154   Radiological Exams on Admission: No results found.  Assessment/Plan Principal Problem:   Comfort measures only status   Pt under GIP full comfort care. See comfort care orders.   Standley Dakins MD [facility] How to contact the [facility] Attending or Consulting provider 7A - 7P or covering provider during after hours 7P -7A, for this patient?  1. Check the care team in [facility] and look for a) attending/consulting TRH provider listed and b) the [facility] team listed 2. Log into [email or web address] and use [place]'s universal password to access. If you do not have the password, please contact the hospital operator. 3. Locate the [facility] provider you are looking for under [facility] and  page to a number that you can be directly reached. 4. If you still have difficulty reaching the provider, please page the [facility] (Director on Call) for the Hospitalists listed on amion for assistance.  If 7PM-7AM, please contact night-coverage [email or web address] Password [email or web address]  09/20/2019, 11:54 AM

## 2019-09-05 NOTE — Discharge Summary (Signed)
Physician Discharge Summary  Valerie Carr MIW:[id number] DOB: 04/05/1929 DOA: 09/02/2019  PCP: Lindaann Pascal  Admit date: 09/02/2019 Discharge date: September 30, 2019  Disposition:  Hospice  Discharge Condition: HOSPICE   CODE STATUS: DNR    Brief Hospitalization Summary: Please see all hospital notes, images, labs for full details of the hospitalization. HPI: Valerie Carr is a 86 y.o. female with medical history significant of arteriosclerotic cardiovascular disease, chronic anxiety and depression, dementia, type 2 diabetes, gallstones, hypertension, hyperlipidemia, sick sinus syndrome, history of pacemaker placement, history of other nonhemorrhagic CVA who was brought from [facility] for AMS and decreased appetite.  EMS described that the patient was febrile, tachycardic in the 120s and 130s and hypoxic of 80% on room air.  [facility] staff told the patient had a hip replacement with a loose screw that could lead to any infection.  She received a gram of ceftriaxone the facility.  She is on BiPAP ventilation now and unable to provide further information.  ED Course: Initial vital signs were temperature 102.3 F, pulse 117, respirations 23, BP 143/73 mmHg and O2 sat 96% on BiPAP.  She was given acetaminophen 650 mg PR, cefepime, metronidazole and vancomycin per pharmacy along with 500 mL of NS bolus.  Urinalysis was cloudy with glucosuria more than 500 and proteinuria more than 30 mg/dL.  There was trace leukocyte esterase with many bacteria on microscopic examination.  CBC showed a white count of 21.3, hemoglobin 16.1 grams per deciliter and platelets 214.  PT is/INR/PTT within normal limits.  Sodium 160, potassium 3.1, chloride 121 and CO2 27 mmol/L.  Glucose 322, BUN 60 and creatinine 1.19 mg/dL.  LFTs are within normal limits.  SARS coronavirus 2 was negative.  Pt was admitted with Severe sepsis Severe dehydration Hypernatremia Acute renal failure and Septic Shock.  After aggressive medical  treatments it was noted that there was not any meaningful improvement.   I had a long discussion with patient's daughter and son.  They are both in agreement that they would not want further full scope care if patient showing signs of no meaningful improvement.  She remains obtunded.  She was grimacing with discomfort with light touch and did not want blood draws etc. They requested full comfort care measures and to stop full scope treatments. The are agreeable to foley placement for comfort.  They want to DC IV, fluids, antibiotics and to focus care on end of life comfort care.   I have ordered to open up visitation for comfort care and starting full comfort care orders.  Discussed with bedside RN.   Pt was seen by [facility] hospice RN and recommended GIP status.   Discharge Diagnoses:  Principal Problem:   Hypernatremia Active Problems:   Alzheimer's disease (HCC)   Chronic atrial fibrillation (HCC)   AKI (acute kidney injury) (HCC)   Hyperglycemia   Hypokalemia   Sepsis due to undetermined organism (HCC)   Acute respiratory failure with hypoxia [facility])   Discharge Instructions:    Contact information for follow-up providers    [place] Follow up.   Contact information: [address] [place] [address] [phone]            Contact information for after-discharge care    Destination    [facility] .   Service: Inpatient Hospice Contact information: [address] [place] [address] [phone]  Allergies  Allergen Reactions  . Clarithromycin Other (See Comments)    unknown  . Codeine Phosphate Other (See Comments)    unknown  . Effexor [Venlafaxine Hydrochloride] Other (See Comments)    unknown  . Fexofenadine Hcl Other (See Comments)    unknown  . Lisinopril Other (See Comments)    unknown  . Prozac [Fluoxetine Hcl] Other (See Comments)    unknown  . Septra [Bactrim] Other (See  Comments)    unknown  . Wellbutrin [Bupropion Hcl] Other (See Comments)    unknown    Procedures/Studies: DG Chest Port 1 View  Result Date: 09/02/2019 CLINICAL DATA:  Shortness of breath, altered mental status EXAM: PORTABLE CHEST 1 VIEW COMPARISON:  Radiograph 07/19/2019 FINDINGS: Chronic hyperinflation and coarsened interstitial changes present throughout both lungs with superimposed areas of patchy consolidative opacity throughout the right hemithorax and perhaps minimally in the left lung base left upper lobe as well. The aorta is calcified. The remaining cardiomediastinal contours are unremarkable. Postsurgical changes related to prior CABG including intact and aligned sternotomy wires and multiple surgical clips projecting over the mediastinum. Pacer pack overlies the left chest wall with leads at the right atrium and cardiac apex. The osseous structures appear diffusely demineralized which may limit detection of small or nondisplaced fractures. No acute osseous abnormality or suspicious osseous lesion. Degenerative changes are present in the imaged spine and shoulders. Telemetry leads overlie the chest. IMPRESSION: Chronic hyperinflation and coarsened interstitial changes throughout both lungs Superimposed patchy consolidative opacity throughout the right hemithorax and within the left apex and lung base. Worrisome for a multifocal infectious process. Electronically Signed   By: Kreg Shropshire M.D.   On: 09/02/2019 22:32      Discharge Exam: Vitals:   09/03/19 1700 09/04/19 1527  BP:  122/76  Pulse: 90 94  Resp: 17 (!) 23  Temp:  98.8 F (37.1 C)  SpO2: 99% 97%   Vitals:   09/03/19 1630 09/03/19 1645 09/03/19 1700 09/04/19 1527  BP:    122/76  Pulse: 94  90 94  Resp: (!) 23 18 17  (!) 23  Temp:    98.8 F (37.1 C)  TempSrc:      SpO2: 98%  99% 97%  Weight:      Height:       General: Pt somnolent and appears terminal   The results of significant diagnostics from this  hospitalization (including imaging, microbiology, ancillary and laboratory) are listed below for reference.     Microbiology: Recent Results (from the past 240 hour(s))  SARS Coronavirus 2 by RT PCR (hospital order, performed in [facility] hospital lab) Nasopharyngeal Nasopharyngeal Swab     Status: None   Collection Time: 09/02/19  9:55 PM   Specimen: Nasopharyngeal Swab  Result Value Ref Range Status   SARS Coronavirus 2 NEGATIVE NEGATIVE Final    Comment: (NOTE) SARS-CoV-2 target nucleic acids are NOT DETECTED.  The SARS-CoV-2 RNA is generally detectable in upper and lower respiratory specimens during the acute phase of infection. The lowest concentration of SARS-CoV-2 viral copies this assay can detect is 250 copies / mL. A negative result does not preclude SARS-CoV-2 infection and should not be used as the sole basis for treatment or other patient management decisions.  A negative result may occur with improper specimen collection / handling, submission of specimen other than nasopharyngeal swab, presence of viral mutation(s) within the areas targeted by this assay, and inadequate number of viral copies (<250 copies / mL). A negative  result must be combined with clinical observations, patient history, and epidemiological information.  Fact Sheet for Patients:   BoilerBrush.com.cyhttps://www.fda.gov/media/136312/download  Fact Sheet for Healthcare Providers: https://pope.com/https://www.fda.gov/media/136313/download  This test is not yet approved or  cleared by the [place] FDA and has been authorized for detection and/or diagnosis of SARS-CoV-2 by FDA under an Emergency Use Authorization (EUA).  This EUA will remain in effect (meaning this test can be used) for the duration of the COVID-19 declaration under Section 564(b)(1) of the Act, 21 U.S.C. section 360bbb-3(b)(1), unless the authorization is terminated or revoked sooner.  Performed at [facility], [address] [place], [place] [address]    Urine culture     Status: Abnormal   Collection Time: 09/02/19 10:01 PM   Specimen: In/Out Cath Urine  Result Value Ref Range Status   Specimen Description   Final    IN/OUT CATH URINE Performed at [facility], [address] [place], [place] [address]    Special Requests   Final    NONE Performed at [facility], [address] [place], [place] [address]    Culture MULTIPLE SPECIES PRESENT, SUGGEST RECOLLECTION (A)  Final   Report Status 09/04/2019 FINAL  Final  Culture, blood (Routine x 2)     Status: None (Preliminary result)   Collection Time: 09/02/19 10:23 PM   Specimen: Left Antecubital; Blood  Result Value Ref Range Status   Specimen Description LEFT ANTECUBITAL  Final   Special Requests   Final    BOTTLES DRAWN AEROBIC AND ANAEROBIC Blood Culture adequate volume   Culture   Final    NO GROWTH < 12 HOURS Performed at [facility], [address] [place], [place] [address]    Report Status PENDING  Incomplete  Culture, blood (Routine x 2)     Status: None (Preliminary result)   Collection Time: 09/02/19 10:23 PM   Specimen: BLOOD LEFT HAND  Result Value Ref Range Status   Specimen Description BLOOD LEFT HAND  Final   Special Requests   Final    BOTTLES DRAWN AEROBIC AND ANAEROBIC Blood Culture results may not be optimal due to an inadequate volume of blood received in culture bottles   Culture   Final    NO GROWTH < 12 HOURS Performed at [facility], [address] [place], [place] [address]    Report Status PENDING  Incomplete  MRSA PCR Screening     Status: Abnormal   Collection Time: 09/03/19 11:30 AM   Specimen: Nasal Mucosa; Nasopharyngeal  Result Value Ref Range Status   MRSA by PCR POSITIVE (A) NEGATIVE Final    Comment:        The GeneXpert MRSA Assay (FDA approved for NASAL specimens only), is one component of a comprehensive MRSA colonization surveillance program. It is not intended to diagnose MRSA infection nor to guide or monitor treatment  for MRSA infections. CRITICAL RESULT CALLED TO, READ BACK BY AND VERIFIED WITH: MALINDA POINDEXTER,RN @1422  09/03/2019 KAY Performed at [facility], [address] [place], [place] [address]      Labs: BNP (last 3 results) No results for input(s): BNP in the last 8760 hours. Basic Metabolic Panel: Recent Labs  Lab 09/03/19 0239 09/03/19 0552 09/03/19 0803 09/03/19 0948 09/03/19 1204  NA 156* 157* 156* 156* 149*  K 3.5 3.6 3.3* 3.4* 4.8  CL 122* 121* 125* 123* 118*  CO2 26 25 18* 22 23  GLUCOSE 411* 409* 325* 307* 230*  BUN 61* 61* 56* 62* 54*  CREATININE 1.60* 1.73* 1.54* 1.77* 1.36*  CALCIUM 8.7* 8.4* 7.9* 8.3* 8.0*   Liver Function Tests: Recent Labs  Lab 09/02/19 2155 09/03/19 0552  AST 15 16  ALT 17 14  ALKPHOS 73 52  BILITOT 0.5 0.8  PROT 7.8 6.0*  ALBUMIN 3.9 3.0*   No results for input(s): LIPASE, AMYLASE in the last 168 hours. No results for input(s): AMMONIA in the last 168 hours. CBC: Recent Labs  Lab 09/02/19 2155 09/03/19 0552  WBC 21.3* 19.0*  NEUTROABS 18.1* 15.8*  HGB 16.1* 13.1  HCT 52.4* 44.6  MCV 99.6 103.7*  PLT 214 137*   Cardiac Enzymes: Recent Labs  Lab 09/03/19 0552  CKTOTAL 60   BNP: Invalid input(s): POCBNP CBG: Recent Labs  Lab 09/03/19 0800 09/03/19 1142  GLUCAP 340* 196*   D-Dimer No results for input(s): DDIMER in the last 72 hours. Hgb A1c No results for input(s): HGBA1C in the last 72 hours. Lipid Profile No results for input(s): CHOL, HDL, LDLCALC, TRIG, CHOLHDL, LDLDIRECT in the last 72 hours. Thyroid function studies No results for input(s): TSH, T4TOTAL, T3FREE, THYROIDAB in the last 72 hours.  Invalid input(s): FREET3 Anemia work up No results for input(s): VITAMINB12, FOLATE, FERRITIN, TIBC, IRON, RETICCTPCT in the last 72 hours. Urinalysis    Component Value Date/Time   COLORURINE YELLOW 09/02/2019 2154   APPEARANCEUR CLOUDY (A) 09/02/2019 2154   LABSPEC 1.030 09/02/2019 2154   PHURINE 5.0  09/02/2019 2154   GLUCOSEU >=500 (A) 09/02/2019 2154   HGBUR SMALL (A) 09/02/2019 2154   BILIRUBINUR NEGATIVE 09/02/2019 2154   KETONESUR NEGATIVE 09/02/2019 2154   PROTEINUR 30 (A) 09/02/2019 2154   UROBILINOGEN 1.0 05/26/2013 1648   NITRITE NEGATIVE 09/02/2019 2154   LEUKOCYTESUR TRACE (A) 09/02/2019 2154   Sepsis Labs Invalid input(s): PROCALCITONIN,  WBC,  LACTICIDVEN Microbiology Recent Results (from the past 240 hour(s))  SARS Coronavirus 2 by RT PCR (hospital order, performed in [facility] hospital lab) Nasopharyngeal Nasopharyngeal Swab     Status: None   Collection Time: 09/02/19  9:55 PM   Specimen: Nasopharyngeal Swab  Result Value Ref Range Status   SARS Coronavirus 2 NEGATIVE NEGATIVE Final    Comment: (NOTE) SARS-CoV-2 target nucleic acids are NOT DETECTED.  The SARS-CoV-2 RNA is generally detectable in upper and lower respiratory specimens during the acute phase of infection. The lowest concentration of SARS-CoV-2 viral copies this assay can detect is 250 copies / mL. A negative result does not preclude SARS-CoV-2 infection and should not be used as the sole basis for treatment or other patient management decisions.  A negative result may occur with improper specimen collection / handling, submission of specimen other than nasopharyngeal swab, presence of viral mutation(s) within the areas targeted by this assay, and inadequate number of viral copies (<250 copies / mL). A negative result must be combined with clinical observations, patient history, and epidemiological information.  Fact Sheet for Patients:   BoilerBrush.com.cy  Fact Sheet for Healthcare Providers: https://pope.com/  This test is not yet approved or  cleared by the [place] FDA and has been authorized for detection and/or diagnosis of SARS-CoV-2 by FDA under an Emergency Use Authorization (EUA).  This EUA will remain in effect (meaning this  test can be used) for the duration of the COVID-19 declaration under Section 564(b)(1) of the Act, 21 U.S.C. section 360bbb-3(b)(1), unless the authorization is terminated or revoked sooner.  Performed at [facility], [address] [place], [place] [address]   Urine  culture     Status: Abnormal   Collection Time: 09/02/19 10:01 PM   Specimen: In/Out Cath Urine  Result Value Ref Range Status   Specimen Description   Final    IN/OUT CATH URINE Performed at [facility], [address] [place], [place] [address]    Special Requests   Final    NONE Performed at [facility], [address] [place], [place] [address]    Culture MULTIPLE SPECIES PRESENT, SUGGEST RECOLLECTION (A)  Final   Report Status 09/04/2019 FINAL  Final  Culture, blood (Routine x 2)     Status: None (Preliminary result)   Collection Time: 09/02/19 10:23 PM   Specimen: Left Antecubital; Blood  Result Value Ref Range Status   Specimen Description LEFT ANTECUBITAL  Final   Special Requests   Final    BOTTLES DRAWN AEROBIC AND ANAEROBIC Blood Culture adequate volume   Culture   Final    NO GROWTH < 12 HOURS Performed at [facility], [address] [place], [place] [address]    Report Status PENDING  Incomplete  Culture, blood (Routine x 2)     Status: None (Preliminary result)   Collection Time: 09/02/19 10:23 PM   Specimen: BLOOD LEFT HAND  Result Value Ref Range Status   Specimen Description BLOOD LEFT HAND  Final   Special Requests   Final    BOTTLES DRAWN AEROBIC AND ANAEROBIC Blood Culture results may not be optimal due to an inadequate volume of blood received in culture bottles   Culture   Final    NO GROWTH < 12 HOURS Performed at [facility], [address], [place], [place] [address]    Report Status PENDING  Incomplete  MRSA PCR Screening     Status: Abnormal   Collection Time: 09/03/19 11:30 AM   Specimen: Nasal Mucosa; Nasopharyngeal  Result Value Ref Range Status   MRSA by PCR POSITIVE (A)  NEGATIVE Final    Comment:        The GeneXpert MRSA Assay (FDA approved for NASAL specimens only), is one component of a comprehensive MRSA colonization surveillance program. It is not intended to diagnose MRSA infection nor to guide or monitor treatment for MRSA infections. CRITICAL RESULT CALLED TO, READ BACK BY AND VERIFIED WITH: MALINDA POINDEXTER,RN @1422  09/03/2019 KAY Performed at [facility], [address] [place], [place]    Time coordinating discharge:   SIGNED:  [id number], MD  [facility] 09/12/2019, 11:08 AM How to contact the [facility] Attending or Consulting provider 7A - 7P or covering provider during after hours 7P -7A, for this patient?  1. Check the care team in [facility] and look for a) attending/consulting TRH provider listed and b) the [facility] team listed 2. Log into [email or web address] and use [place]'s universal password to access. If you do not have the password, please contact the hospital operator. 3. Locate the [facility] provider you are looking for under [facility] and page to a number that you can be directly reached. 4. If you still have difficulty reaching the provider, please page the [facility] (Director on Call) for the Hospitalists listed on amion for assistance.

## 2019-09-05 NOTE — TOC Progression Note (Signed)
Transition of Care [facility]) - Progression Note    Patient Details  Name: Valerie Carr MRN: [id number] Date of Birth: 05-26-1929  Transition of Care [facility]) CM/SW Contact  Leitha Bleak, RN Phone Number: 05-Oct-2019, 1:23 PM  Clinical Narrative:   Patient referred to Hospice over the weekend. Hospice nurse and MD at the bedside assessing today. Daughter at the bedside and [name] on the patient, both are POA. Decision made to make patient GIP. Chart has been flipped.       Barriers to Discharge: Continued Medical Work up (GIP)  Expected Discharge Plan and Services      Hospice patient - GIP

## 2019-09-06 NOTE — Progress Notes (Signed)
09/06/2019 5:12 PM  GIP Progress Note   Pt examined at bedside. At this time patient appears terminally ill with agonal breathing.  Pt also appears much more comfortable since being started on the continuous low dose morphine infusion.    Vitals:   09/06/19 1100 09/06/19 1400  BP:    Pulse: (!) 120 (!) 122  Resp: (!) 24 (!) 24  Temp: 98.7 F (37.1 C) 99 F (37.2 C)  SpO2:  (!) 83%    Exam:  General: Pt appears terminal with agonal breathing.  Pt appears comfortable with no further grimacing seen.    A/P:  Principal Problem:   Comfort measures only status  Continue current orders  Standley Dakins MD How to contact the [facility] Attending or Consulting provider 7A - 7P or covering provider during after hours 7P -7A, for this patient?  1. Check the care team in [facility] and look for a) attending/consulting TRH provider listed and b) the [facility] team listed 2. Log into [email or web address] and use [place]'s universal password to access. If you do not have the password, please contact the hospital operator. 3. Locate the [facility] provider you are looking for under [facility] and page to a number that you can be directly reached. 4. If you still have difficulty reaching the provider, please page the [facility] (Director on Call) for the Hospitalists listed on amion for assistance.

## 2019-09-07 LAB — CULTURE, BLOOD (ROUTINE X 2)
Culture: NO GROWTH
Culture: NO GROWTH
Special Requests: ADEQUATE

## 2019-09-07 MED ORDER — ACETAMINOPHEN 650 MG RE SUPP
650.0000 mg | RECTAL | Status: AC
  Administered 2019-09-07: 650 mg via RECTAL

## 2019-09-21 DIAGNOSIS — D72829 Elevated white blood cell count, unspecified: Secondary | ICD-10-CM | POA: Diagnosis not present

## 2019-09-21 DIAGNOSIS — I639 Cerebral infarction, unspecified: Secondary | ICD-10-CM | POA: Diagnosis not present

## 2019-09-21 DIAGNOSIS — R627 Adult failure to thrive: Secondary | ICD-10-CM | POA: Diagnosis not present

## 2019-09-21 DIAGNOSIS — E860 Dehydration: Secondary | ICD-10-CM | POA: Diagnosis not present

## 2019-09-21 DIAGNOSIS — F0151 Vascular dementia with behavioral disturbance: Secondary | ICD-10-CM | POA: Diagnosis not present

## 2019-09-28 NOTE — Progress Notes (Signed)
No change in patient condition. Resp 20-24/min, shallow and unlabored. HR 120-126 and irregular. Skin warm and dry, no progression of mottling since am rounds. Hospice nurse in to assess and speak with family. Pt moans with touch and movement, morphine bolus administered. Oral care performed. Family at bedside.

## 2019-09-28 NOTE — Progress Notes (Addendum)
1743: Heard screaming from pt room, saw pt's niece coming down hall yelling for help. Into room to find pt's daughter crying and screaming, leaning over patient saying, "She stopped breathing! She stopped breathing! Oh my God, mama, are you gone?!" Pt with no spontaneous respirations noted, no hear tones noted by auscultation of chest verified by this nurse and Jillene Bucks, RN. Pupils fixed. Time of death called at 1745.  Pt's daughter continues to scream and yell, screaming at her cousin, throwing her glasses and headband across room. Cousin left and pt's daughter calmed some. Offered daughter tissues and a chair to sit in, she states, "I don't know what I'm doing. I hate her (referencing cousin) and my mama's dead! Oh God, help me!" Another female family member present in room and offered reassurance and calming to daughter. Advised daughter that I would notify MD of her mother's death and then I would return to see how I could further assist her at this time. Pt's daughter stated understanding, currently on phone with pastor.  MD Sherryll Burger notified of pt's death.

## 2019-09-28 NOTE — Progress Notes (Signed)
Pt's temp has increased since rectal tylenol administered at 1400. Temp now 101.8. Pt's resp more shallow and labored, pt with facial grimacing and moaning. Pt now with mottling noted dependently on back of calves, thighs and buttocks.  MD Sherryll Burger notified of increased temp and order received for additional Tylenol dose now and cooling blanket as needed. Pt was given bolus of 0.5mg  Morphine for comfort. Spoke with pt's family regarding increasing dose of morphine drip due to increased respiratory effort and pt discomfort. Daughter [name] advised via phone and agreeable to increase drip by 0.5mg . Other family members remain at bedside.

## 2019-09-28 NOTE — Death Summary Note (Signed)
Physician Discharge Summary  KAYBREE WILLIAMS ZOX:[id number] DOB: Mar 04, 1929 DOA: 09-08-19  PCP: Lindaann Pascal  Admit date: 08-Sep-2019  Death date: 10-Sep-2019 1745  Admitted From:Home  Disposition:  Expired  Brief/Interim Summary: Please see all hospital notes, images, labs for full details of the hospitalization. WJX:BJYNWGN J Suttonis a 86 y.o.femalewith medical history significant ofarteriosclerotic cardiovascular disease, chronic anxiety and depression, dementia, type 2 diabetes, gallstones, hypertension, hyperlipidemia, sick sinus syndrome, history of pacemaker placement, history of other nonhemorrhagic CVA who was brought from [facility] for AMS and decreased appetite. EMS described that the patient was febrile, tachycardic in the 120s and 130s and hypoxic of 80% on room air. [facility] staff told the patient had a hip replacement with a loose screw that could lead to any infection. She received a gram of ceftriaxone the facility. She is on BiPAP ventilation now and unable to provide further information.  ED Course:Initial vital signs were temperature102.3 F, pulse 117, respirations 23, BP 143/73 mmHg and O2 sat 96% on BiPAP. She was given acetaminophen 650 mg PR, cefepime, metronidazole and vancomycin per pharmacy along with 500 mL of NS bolus.  Urinalysis was cloudy with glucosuria more than 500 and proteinuria more than 30 mg/dL. There was trace leukocyte esterase with many bacteria on microscopic examination. CBC showed a white count of 21.3, hemoglobin 16.1 grams per deciliter and platelets 214. PT is/INR/PTT within normal limits. Sodium 160, potassium 3.1, chloride 121 and CO2 27 mmol/L. Glucose 322, BUN 60 and creatinine 1.19 mg/dL. LFTs are within normal limits. SARS coronavirus 2 was negative.  Pt was admitted with Severe sepsis Severe dehydration Hypernatremia Acute renal failure and Septic Shock.  After aggressive medical treatments it was noted that there  was not any meaningful improvement.   Pt was transitioned to comfort care on 09-08-2022 and was seen by [facility] hospice RN with GIP status recommended. She was started on a morphine drip for comfort and continued to have some febrile episodes. She eventually expired on 09/10/19 at 1745.  Discharge Diagnoses:  Principal Problem:   Comfort measures only status     Contact information for after-discharge care    Destination    [facility] .   Service: Inpatient Hospice Contact information: [address] [place] [address] [phone]                 Allergies  Allergen Reactions  . Clarithromycin Other (See Comments)    unknown  . Codeine Phosphate Other (See Comments)    unknown  . Effexor [Venlafaxine Hydrochloride] Other (See Comments)    unknown  . Fexofenadine Hcl Other (See Comments)    unknown  . Lisinopril Other (See Comments)    unknown  . Prozac [Fluoxetine Hcl] Other (See Comments)    unknown  . Septra [Bactrim] Other (See Comments)    unknown  . Wellbutrin [Bupropion Hcl] Other (See Comments)    unknown   Procedures/Studies: DG Chest Port 1 View  Result Date: 09/02/2019 CLINICAL DATA:  Shortness of breath, altered mental status EXAM: PORTABLE CHEST 1 VIEW COMPARISON:  Radiograph 07/19/2019 FINDINGS: Chronic hyperinflation and coarsened interstitial changes present throughout both lungs with superimposed areas of patchy consolidative opacity throughout the right hemithorax and perhaps minimally in the left lung base left upper lobe as well. The aorta is calcified. The remaining cardiomediastinal contours are unremarkable. Postsurgical changes related to prior CABG including intact and aligned sternotomy wires and multiple surgical clips projecting over the mediastinum. Pacer pack  overlies the left chest wall with leads at the right atrium and cardiac apex. The osseous structures appear diffusely demineralized which may limit  detection of small or nondisplaced fractures. No acute osseous abnormality or suspicious osseous lesion. Degenerative changes are present in the imaged spine and shoulders. Telemetry leads overlie the chest. IMPRESSION: Chronic hyperinflation and coarsened interstitial changes throughout both lungs Superimposed patchy consolidative opacity throughout the right hemithorax and within the left apex and lung base. Worrisome for a multifocal infectious process. Electronically Signed   By: Kreg ShropshirePrice  DeHay M.D.   On: 09/02/2019 22:32     Discharge Exam: Vitals:   09/27/2019 1352 09/27/2019 1600  BP:    Pulse: (!) 128 (!) 132  Resp: (!) 32 (!) 36  Temp: (!) 101.2 F (38.4 C) (!) 101.8 F (38.8 C)  SpO2:     Vitals:   09/17/2019 0518 09/14/2019 0800 09/22/2019 1352 09/11/2019 1600  BP: (!) 152/98     Pulse: (!) 120 (!) 124 (!) 128 (!) 132  Resp:  (!) 24 (!) 32 (!) 36  Temp: 98.3 F (36.8 C) 99.5 F (37.5 C) (!) 101.2 F (38.4 C) (!) 101.8 F (38.8 C)  TempSrc: Oral Rectal Rectal Rectal  SpO2: (!) 89%        The results of significant diagnostics from this hospitalization (including imaging, microbiology, ancillary and laboratory) are listed below for reference.     Microbiology: Recent Results (from the past 240 hour(s))  SARS Coronavirus 2 by RT PCR (hospital order, performed in [facility] hospital lab) Nasopharyngeal Nasopharyngeal Swab     Status: None   Collection Time: 09/02/19  9:55 PM   Specimen: Nasopharyngeal Swab  Result Value Ref Range Status   SARS Coronavirus 2 NEGATIVE NEGATIVE Final    Comment: (NOTE) SARS-CoV-2 target nucleic acids are NOT DETECTED.  The SARS-CoV-2 RNA is generally detectable in upper and lower respiratory specimens during the acute phase of infection. The lowest concentration of SARS-CoV-2 viral copies this assay can detect is 250 copies / mL. A negative result does not preclude SARS-CoV-2 infection and should not be used as the sole basis for treatment or  other patient management decisions.  A negative result may occur with improper specimen collection / handling, submission of specimen other than nasopharyngeal swab, presence of viral mutation(s) within the areas targeted by this assay, and inadequate number of viral copies (<250 copies / mL). A negative result must be combined with clinical observations, patient history, and epidemiological information.  Fact Sheet for Patients:   BoilerBrush.com.cyhttps://www.fda.gov/media/136312/download  Fact Sheet for Healthcare Providers: https://pope.com/https://www.fda.gov/media/136313/download  This test is not yet approved or  cleared by the [place] FDA and has been authorized for detection and/or diagnosis of SARS-CoV-2 by FDA under an Emergency Use Authorization (EUA).  This EUA will remain in effect (meaning this test can be used) for the duration of the COVID-19 declaration under Section 564(b)(1) of the Act, 21 U.S.C. section 360bbb-3(b)(1), unless the authorization is terminated or revoked sooner.  Performed at [facility], [address] [place], [place] [address]   Urine culture     Status: Abnormal   Collection Time: 09/02/19 10:01 PM   Specimen: In/Out Cath Urine  Result Value Ref Range Status   Specimen Description   Final    IN/OUT CATH URINE Performed at [facility], [address] [place], [place] [address]    Special Requests   Final    NONE Performed at [facility], [address] [place],  [place] [address]    Culture MULTIPLE SPECIES PRESENT, SUGGEST RECOLLECTION (A)  Final   Report Status 09/04/2019 FINAL  Final  Culture, blood (Routine x 2)     Status: None   Collection Time: 09/02/19 10:23 PM   Specimen: Left Antecubital; Blood  Result Value Ref Range Status   Specimen Description LEFT ANTECUBITAL  Final   Special Requests   Final    BOTTLES DRAWN AEROBIC AND ANAEROBIC Blood Culture adequate volume   Culture   Final    NO GROWTH 5 DAYS Performed at [facility], [address],  [place], [place] [address]    Report Status 09-28-2019 FINAL  Final  Culture, blood (Routine x 2)     Status: None   Collection Time: 09/02/19 10:23 PM   Specimen: BLOOD LEFT HAND  Result Value Ref Range Status   Specimen Description BLOOD LEFT HAND  Final   Special Requests   Final    BOTTLES DRAWN AEROBIC AND ANAEROBIC Blood Culture results may not be optimal due to an inadequate volume of blood received in culture bottles   Culture   Final    NO GROWTH 5 DAYS Performed at [facility], [address], [place], [place] [address]    Report Status 09/28/19 FINAL  Final  MRSA PCR Screening     Status: Abnormal   Collection Time: 09/03/19 11:30 AM   Specimen: Nasal Mucosa; Nasopharyngeal  Result Value Ref Range Status   MRSA by PCR POSITIVE (A) NEGATIVE Final    Comment:        The GeneXpert MRSA Assay (FDA approved for NASAL specimens only), is one component of a comprehensive MRSA colonization surveillance program. It is not intended to diagnose MRSA infection nor to guide or monitor treatment for MRSA infections. CRITICAL RESULT CALLED TO, READ BACK BY AND VERIFIED WITH: MALINDA POINDEXTER,RN @1422  09/03/2019 KAY Performed at [facility], [address], [place], [place]      Labs: BNP (last 3 results) No results for input(s): BNP in the last 8760 hours. Basic Metabolic Panel: Recent Labs  Lab 09/03/19 0239 09/03/19 0552 09/03/19 0803 09/03/19 0948 09/03/19 1204  NA 156* 157* 156* 156* 149*  K 3.5 3.6 3.3* 3.4* 4.8  CL 122* 121* 125* 123* 118*  CO2 26 25 18* 22 23  GLUCOSE 411* 409* 325* 307* 230*  BUN 61* 61* 56* 62* 54*  CREATININE 1.60* 1.73* 1.54* 1.77* 1.36*  CALCIUM 8.7* 8.4* 7.9* 8.3* 8.0*   Liver Function Tests: Recent Labs  Lab 09/02/19 2155 09/03/19 0552  AST 15 16  ALT 17 14  ALKPHOS 73 52  BILITOT 0.5 0.8  PROT 7.8 6.0*  ALBUMIN 3.9 3.0*   No results for input(s): LIPASE, AMYLASE in the last 168 hours. No results for input(s):  AMMONIA in the last 168 hours. CBC: Recent Labs  Lab 09/02/19 2155 09/03/19 0552  WBC 21.3* 19.0*  NEUTROABS 18.1* 15.8*  HGB 16.1* 13.1  HCT 52.4* 44.6  MCV 99.6 103.7*  PLT 214 137*   Cardiac Enzymes: Recent Labs  Lab 09/03/19 0552  CKTOTAL 60   BNP: Invalid input(s): POCBNP CBG: Recent Labs  Lab 09/03/19 0800 09/03/19 1142  GLUCAP 340* 196*   D-Dimer No results for input(s): DDIMER in the last 72 hours. Hgb A1c No results for input(s): HGBA1C in the last 72 hours. Lipid Profile No results for input(s): CHOL, HDL, LDLCALC, TRIG, CHOLHDL, LDLDIRECT in the last 72 hours. Thyroid function studies No results for input(s):  TSH, T4TOTAL, T3FREE, THYROIDAB in the last 72 hours.  Invalid input(s): FREET3 Anemia work up No results for input(s): VITAMINB12, FOLATE, FERRITIN, TIBC, IRON, RETICCTPCT in the last 72 hours. Urinalysis    Component Value Date/Time   COLORURINE YELLOW 09/02/2019 2154   APPEARANCEUR CLOUDY (A) 09/02/2019 2154   LABSPEC 1.030 09/02/2019 2154   PHURINE 5.0 09/02/2019 2154   GLUCOSEU >=500 (A) 09/02/2019 2154   HGBUR SMALL (A) 09/02/2019 2154   BILIRUBINUR NEGATIVE 09/02/2019 2154   KETONESUR NEGATIVE 09/02/2019 2154   PROTEINUR 30 (A) 09/02/2019 2154   UROBILINOGEN 1.0 05/26/2013 1648   NITRITE NEGATIVE 09/02/2019 2154   LEUKOCYTESUR TRACE (A) 09/02/2019 2154   Sepsis Labs Invalid input(s): PROCALCITONIN,  WBC,  LACTICIDVEN Microbiology Recent Results (from the past 240 hour(s))  SARS Coronavirus 2 by RT PCR (hospital order, performed in [facility] hospital lab) Nasopharyngeal Nasopharyngeal Swab     Status: None   Collection Time: 09/02/19  9:55 PM   Specimen: Nasopharyngeal Swab  Result Value Ref Range Status   SARS Coronavirus 2 NEGATIVE NEGATIVE Final    Comment: (NOTE) SARS-CoV-2 target nucleic acids are NOT DETECTED.  The SARS-CoV-2 RNA is generally detectable in upper and lower respiratory specimens during the acute phase of  infection. The lowest concentration of SARS-CoV-2 viral copies this assay can detect is 250 copies / mL. A negative result does not preclude SARS-CoV-2 infection and should not be used as the sole basis for treatment or other patient management decisions.  A negative result may occur with improper specimen collection / handling, submission of specimen other than nasopharyngeal swab, presence of viral mutation(s) within the areas targeted by this assay, and inadequate number of viral copies (<250 copies / mL). A negative result must be combined with clinical observations, patient history, and epidemiological information.  Fact Sheet for Patients:   BoilerBrush.com.cy  Fact Sheet for Healthcare Providers: https://pope.com/  This test is not yet approved or  cleared by the [place] FDA and has been authorized for detection and/or diagnosis of SARS-CoV-2 by FDA under an Emergency Use Authorization (EUA).  This EUA will remain in effect (meaning this test can be used) for the duration of the COVID-19 declaration under Section 564(b)(1) of the Act, 21 U.S.C. section 360bbb-3(b)(1), unless the authorization is terminated or revoked sooner.  Performed at [facility], [address] [place], [place] [address]   Urine culture     Status: Abnormal   Collection Time: 09/02/19 10:01 PM   Specimen: In/Out Cath Urine  Result Value Ref Range Status   Specimen Description   Final    IN/OUT CATH URINE Performed at [facility], [address] [place], [place] [address]    Special Requests   Final    NONE Performed at [facility], [address] [place], [place] [address]    Culture MULTIPLE SPECIES PRESENT, SUGGEST RECOLLECTION (A)  Final   Report Status 09/04/2019 FINAL  Final  Culture, blood (Routine x 2)     Status: None   Collection Time: 09/02/19 10:23 PM   Specimen: Left Antecubital; Blood  Result Value Ref Range Status   Specimen  Description LEFT ANTECUBITAL  Final   Special Requests   Final    BOTTLES DRAWN AEROBIC AND ANAEROBIC Blood Culture adequate volume   Culture   Final    NO GROWTH 5 DAYS Performed at [facility], [address], [place], [place] [address]    Report Status 08/29/2019 FINAL  Final  Culture, blood (  Routine x 2)     Status: None   Collection Time: 09/02/19 10:23 PM   Specimen: BLOOD LEFT HAND  Result Value Ref Range Status   Specimen Description BLOOD LEFT HAND  Final   Special Requests   Final    BOTTLES DRAWN AEROBIC AND ANAEROBIC Blood Culture results may not be optimal due to an inadequate volume of blood received in culture bottles   Culture   Final    NO GROWTH 5 DAYS Performed at [facility], [address] [place], [place] [address]    Report Status 09/22/19 FINAL  Final  MRSA PCR Screening     Status: Abnormal   Collection Time: 09/03/19 11:30 AM   Specimen: Nasal Mucosa; Nasopharyngeal  Result Value Ref Range Status   MRSA by PCR POSITIVE (A) NEGATIVE Final    Comment:        The GeneXpert MRSA Assay (FDA approved for NASAL specimens only), is one component of a comprehensive MRSA colonization surveillance program. It is not intended to diagnose MRSA infection nor to guide or monitor treatment for MRSA infections. CRITICAL RESULT CALLED TO, READ BACK BY AND VERIFIED WITH: MALINDA POINDEXTER,RN @1422  09/03/2019 KAY Performed at [facility], [address], [place], [place]      Time coordinating discharge: 35 minutes  SIGNED:   [id number], DO [facility] 09-22-19, 6:41 PM  If 7PM-7AM, please contact night-coverage [email or web address]

## 2019-09-28 NOTE — Progress Notes (Signed)
09/06/2019 5:18 PM  GIP Progress Note   Pt examined at bedside.  Patient appears terminally ill and is noted to have some fever that is poorly responsive to Tylenol suppository.  Plans to continue morphine infusion and cooling blankets as needed.  Vitals:   09/11/2019 1352 09/21/2019 1600  BP:    Pulse: (!) 128 (!) 132  Resp: (!) 32 (!) 36  Temp: (!) 101.2 F (38.4 C) (!) 101.8 F (38.8 C)  SpO2:      Exam:  General: Pt appears terminal with agonal breathing.  Pt appears comfortable with no further grimacing seen.    A/P:  Principal Problem:   Comfort measures only status  Continue current orders  Vennessa Affinito Hoover Brunette  DO

## 2019-09-28 NOTE — Progress Notes (Signed)
Family members have left the room. Advised daughter that staff would completed post-mortem care and then pt would be transferred for funeral home pick-up. Daughter states understanding. Pt had no personal belongings in room. IV removed. Report given to Jesse Fall, RN, oncoming nurse.

## 2019-09-28 NOTE — Progress Notes (Signed)
Hospice notified of pt's death.

## 2019-09-28 DEATH — deceased

## 2020-11-07 IMAGING — RF DG C-ARM 1-60 MIN
1 series · 6 of 6 positions shown · non-contrast
Comparison: Preoperative radiograph yesterday.

CLINICAL DATA: IM nail right femur.

EXAM:
DG C-ARM 1-60 MIN; OPERATIVE RIGHT HIP WITH PELVIS
FLUOROSCOPY TIME:  Fluoroscopy Time:  1 minutes 21 seconds
Radiation Exposure Index (if provided by the fluoroscopic device):
9.02 mGy
Number of Acquired Spot Images: 6

[Series 1: run · 6 of 6 slices shown]
[im 1/6]
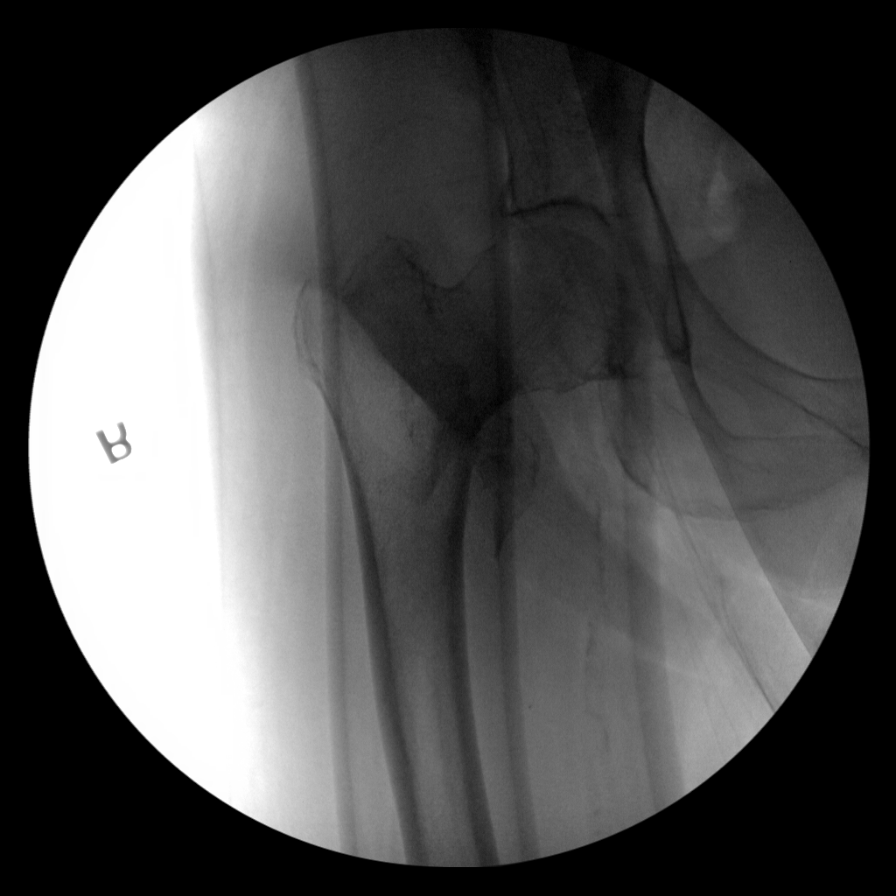
[im 2/6]
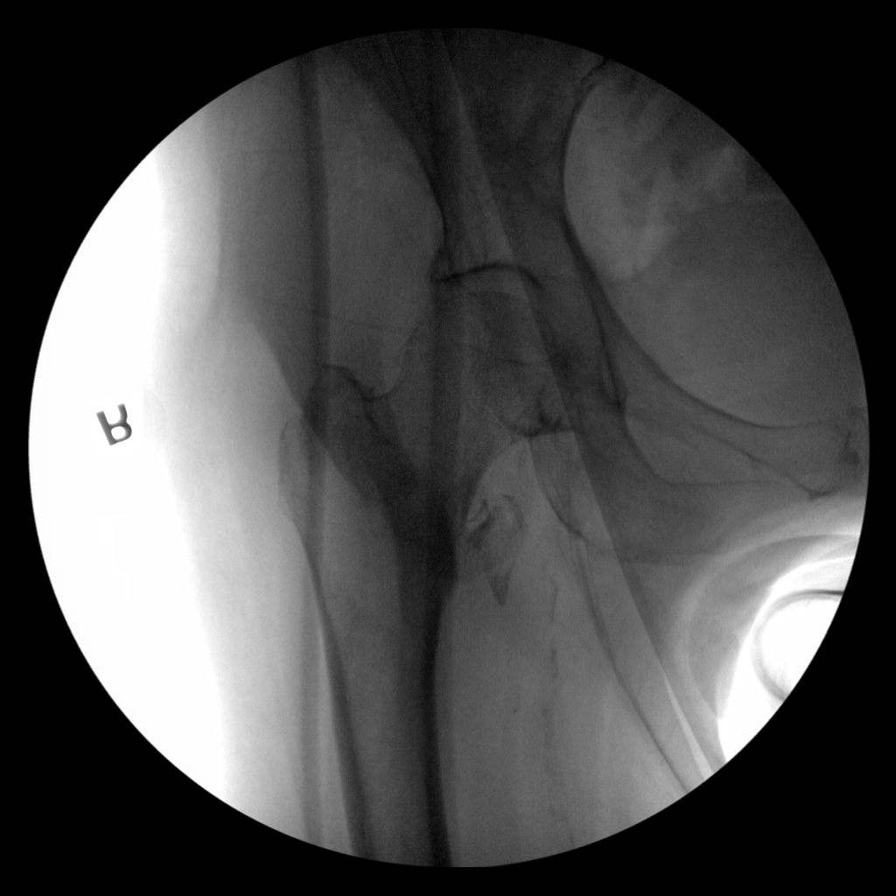
[im 3/6]
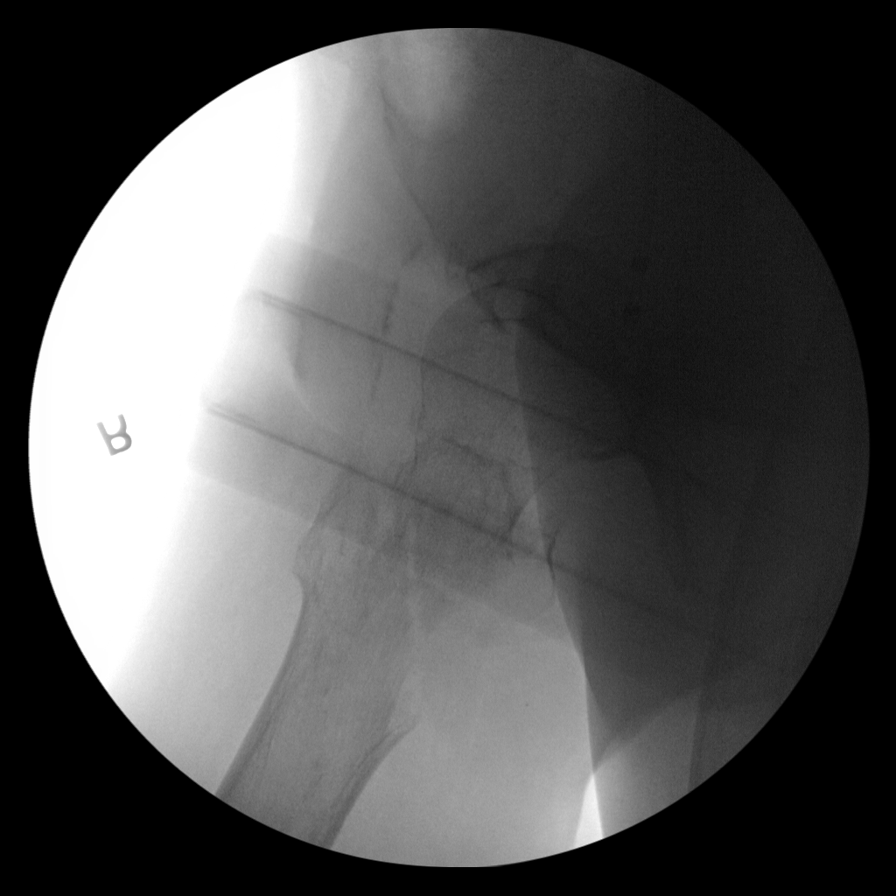
[im 4/6]
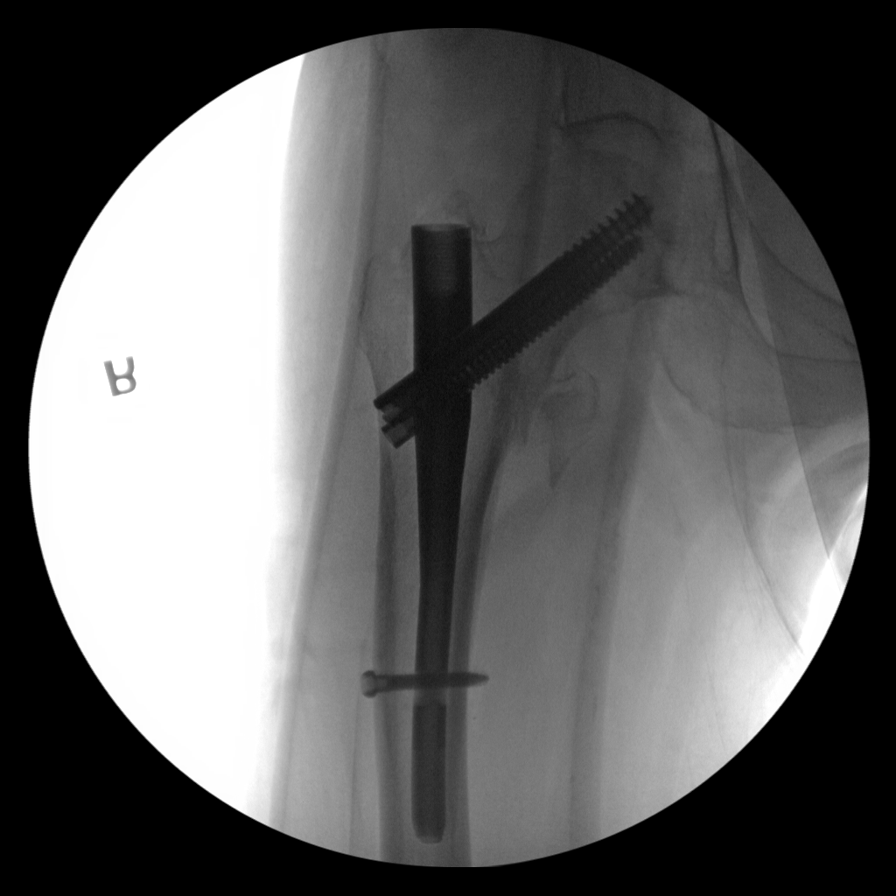
[im 5/6]
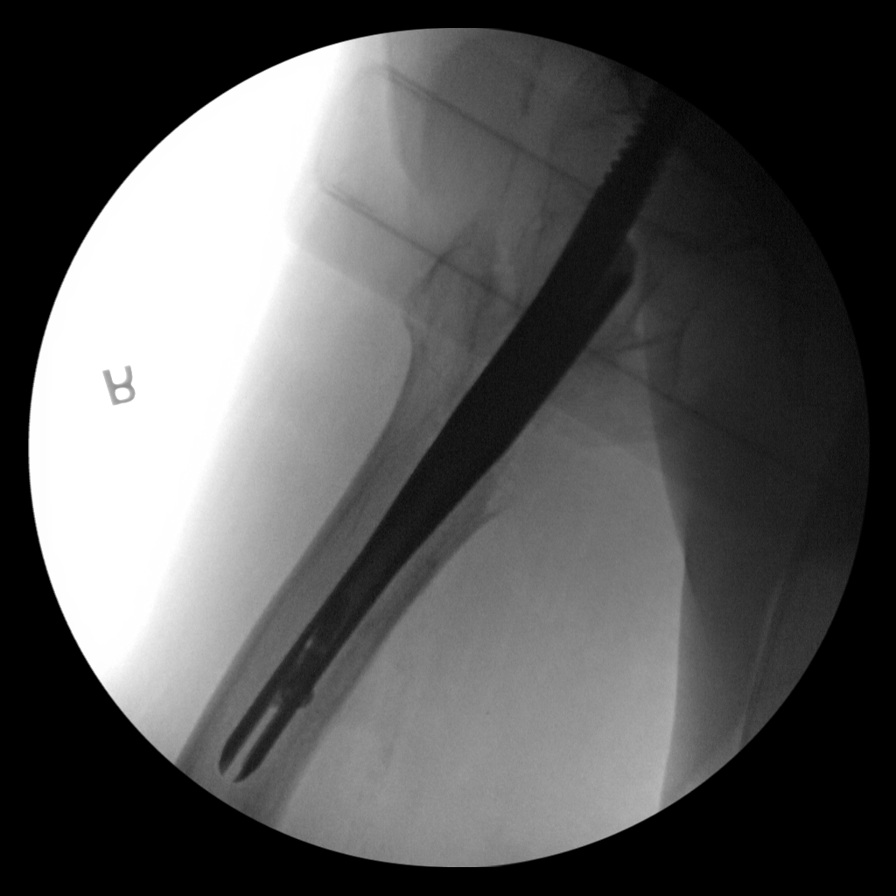
[im 6/6]
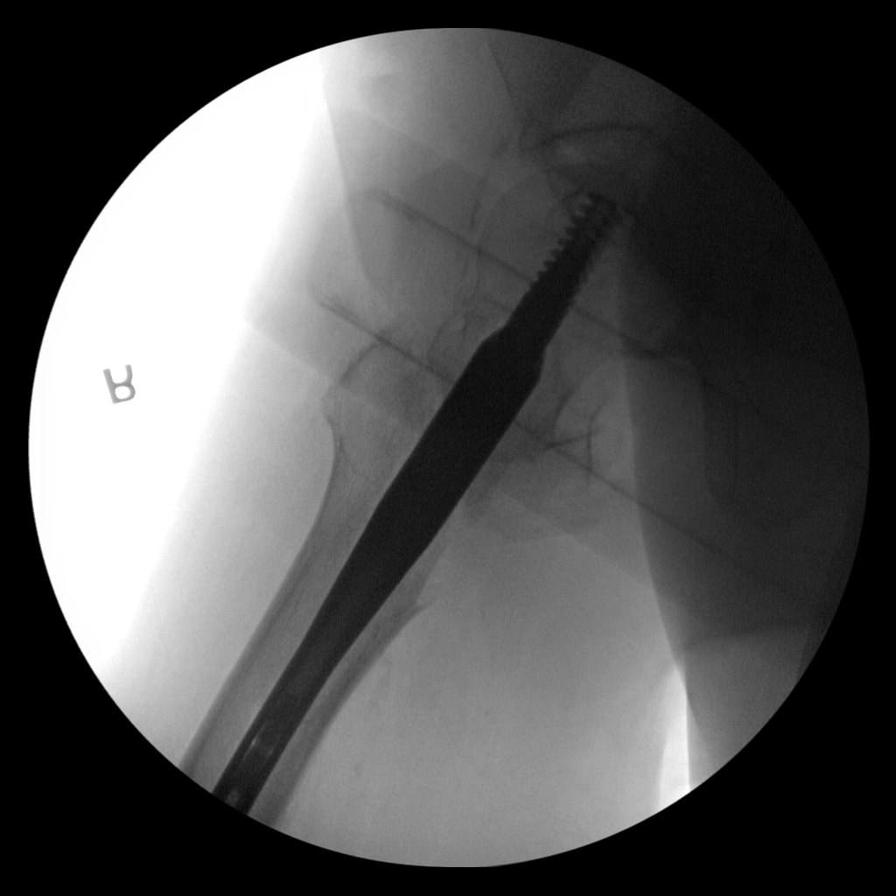

[6 of 6 positions shown; findings below may reference images not displayed]

FINDINGS: Six fluoroscopic spot views obtained in the operating room.
Intramedullary nail with trans trochanteric and distal locking
screws fixate intertrochanteric femur fracture. Fluoroscopy time 1
minutes 21 seconds, dose 9.02 mGy.
IMPRESSION: Procedural fluoroscopy during IM nailing of right proximal femur
fracture.
